# Patient Record
Sex: Female | Born: 1960
Health system: Southern US, Community
[De-identification: ages and names within clinical notes are randomized; demographics above are authoritative.]

## PROBLEM LIST (undated history)

## (undated) DIAGNOSIS — R7303 Prediabetes: Secondary | ICD-10-CM

## (undated) DIAGNOSIS — E785 Hyperlipidemia, unspecified: Secondary | ICD-10-CM

## (undated) DIAGNOSIS — N3281 Overactive bladder: Secondary | ICD-10-CM

## (undated) DIAGNOSIS — I639 Cerebral infarction, unspecified: Secondary | ICD-10-CM

## (undated) DIAGNOSIS — M1990 Unspecified osteoarthritis, unspecified site: Secondary | ICD-10-CM

## (undated) DIAGNOSIS — J449 Chronic obstructive pulmonary disease, unspecified: Secondary | ICD-10-CM

## (undated) DIAGNOSIS — T7840XA Allergy, unspecified, initial encounter: Secondary | ICD-10-CM

## (undated) DIAGNOSIS — K219 Gastro-esophageal reflux disease without esophagitis: Secondary | ICD-10-CM

## (undated) DIAGNOSIS — C4491 Basal cell carcinoma of skin, unspecified: Secondary | ICD-10-CM

## (undated) HISTORY — DX: Cerebral infarction, unspecified: I63.9

## (undated) HISTORY — DX: Gastro-esophageal reflux disease without esophagitis: K21.9

## (undated) HISTORY — PX: TUBAL LIGATION: SHX77

## (undated) HISTORY — DX: Unspecified osteoarthritis, unspecified site: M19.90

## (undated) HISTORY — PX: DILATION AND CURETTAGE OF UTERUS: SHX78

## (undated) HISTORY — DX: Chronic obstructive pulmonary disease, unspecified: J44.9

## (undated) HISTORY — DX: Allergy, unspecified, initial encounter: T78.40XA

## (undated) HISTORY — PX: COLONOSCOPY: SHX174

## (undated) HISTORY — PX: WISDOM TOOTH EXTRACTION: SHX21

---
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
## 2009-05-27 HISTORY — PX: SHOULDER ARTHROSCOPY WITH OPEN ROTATOR CUFF REPAIR: SHX6092

## 2011-08-28 DIAGNOSIS — K5790 Diverticulosis of intestine, part unspecified, without perforation or abscess without bleeding: Secondary | ICD-10-CM

## 2011-08-28 DIAGNOSIS — Z860101 Personal history of adenomatous and serrated colon polyps: Secondary | ICD-10-CM

## 2011-08-28 DIAGNOSIS — Z86010 Personal history of colonic polyps: Secondary | ICD-10-CM

## 2011-08-28 HISTORY — DX: Personal history of adenomatous and serrated colon polyps: Z86.0101

## 2011-08-28 HISTORY — DX: Diverticulosis of intestine, part unspecified, without perforation or abscess without bleeding: K57.90

## 2011-08-28 HISTORY — DX: Personal history of colonic polyps: Z86.010

## 2014-02-19 ENCOUNTER — Encounter: Payer: Self-pay | Admitting: Physical Medicine & Rehabilitation

## 2014-03-26 ENCOUNTER — Encounter: Payer: Medicare Other | Attending: Physical Medicine & Rehabilitation

## 2014-03-26 ENCOUNTER — Ambulatory Visit (HOSPITAL_BASED_OUTPATIENT_CLINIC_OR_DEPARTMENT_OTHER): Payer: Medicare Other | Admitting: Physical Medicine & Rehabilitation

## 2014-03-26 ENCOUNTER — Encounter: Payer: Self-pay | Admitting: Physical Medicine & Rehabilitation

## 2014-03-26 VITALS — BP 132/73 | HR 94 | Resp 16 | Ht 68.5 in | Wt 211.0 lb

## 2014-03-26 DIAGNOSIS — F17200 Nicotine dependence, unspecified, uncomplicated: Secondary | ICD-10-CM | POA: Diagnosis not present

## 2014-03-26 DIAGNOSIS — Z79899 Other long term (current) drug therapy: Secondary | ICD-10-CM

## 2014-03-26 DIAGNOSIS — IMO0002 Reserved for concepts with insufficient information to code with codable children: Secondary | ICD-10-CM

## 2014-03-26 DIAGNOSIS — M25519 Pain in unspecified shoulder: Secondary | ICD-10-CM | POA: Insufficient documentation

## 2014-03-26 DIAGNOSIS — J189 Pneumonia, unspecified organism: Secondary | ICD-10-CM | POA: Diagnosis not present

## 2014-03-26 DIAGNOSIS — M4802 Spinal stenosis, cervical region: Secondary | ICD-10-CM | POA: Diagnosis present

## 2014-03-26 DIAGNOSIS — Z5181 Encounter for therapeutic drug level monitoring: Secondary | ICD-10-CM

## 2014-03-26 DIAGNOSIS — G8929 Other chronic pain: Secondary | ICD-10-CM | POA: Insufficient documentation

## 2014-03-26 DIAGNOSIS — S43429A Sprain of unspecified rotator cuff capsule, initial encounter: Secondary | ICD-10-CM | POA: Insufficient documentation

## 2014-03-26 DIAGNOSIS — J90 Pleural effusion, not elsewhere classified: Secondary | ICD-10-CM | POA: Diagnosis not present

## 2014-03-26 DIAGNOSIS — S43421S Sprain of right rotator cuff capsule, sequela: Secondary | ICD-10-CM

## 2014-03-26 MED ORDER — LIDOCAINE 5 % EX PTCH: 1.0000 | MEDICATED_PATCH | CUTANEOUS | Status: DC

## 2014-03-26 MED ORDER — TRAZODONE HCL 50 MG PO TABS: 50.0000 mg | ORAL_TABLET | Freq: Every day | ORAL | Status: DC

## 2014-03-26 NOTE — Patient Instructions (Signed)
If urine drug screen looks okay which means no illicit drugs or other prescribed narcotic  Medications, or alcohol, then we can call in Tylenol #4, 1 tablet twice per day

## 2014-03-26 NOTE — Progress Notes (Signed)
Subjective:    Patient ID: Susan Cortez, female    DOB: 1961/06/25, 53 y.o.   MRN: [id number] CC:  Neck pain and Right shoulder pain WC DOI:  09/22/2007, Mechanism of injury Looking up at work on a repetitive basis, overhead reaching R arm repetitive HPI Initially was treated for neck pain, then was evaluated for Right shoulder pain.   Xray report 10/19/2008- C5-C6 and C6-C7 degenerative disc and spondylosis causing moderate to severe bilateral neural foraminal encroachment C6-C7 greater than C5-C6  Initially underwent physical therapy working both on shoulder and neck. This was not helpful, postop shoulder rehabilitation was helpful  Cervical MRI was performed. Orthopedic spine surgeon did not feel that surgery would be helpful. Cervical epidural injection was not helpful.  EMG bilateral upper limbs performed on 3/09-2008 was normal  Right shoulder MRI 03/09/2009 ordered by orthopedics shoulder surgeon. Full thickness tear supraspinatus and partial tear infraspinatus down sloping acromion.  Patient underwent open right shoulder decompression and rotator cuff repair  Pain medicine initial consultation 10/25/2009 recommendations for cervical epidural injections. Nor trip to lean was prescribed.  Last orthopedic visit for shoulder injury was 08/10/2010 20 pound lifting restriction, 15% permanent impairment right shoulder  Patient indicates that at settlement total disability percentage was 51%  Other treatments included lidocaine patch which was helpful. Past medications also included hydrocodone,, hydro-more phone, Diclonec, nor triptyline, lyrica, Oxycodone, Gabapentin Initially had some relief with Tylenol #3 became less effective with time. Patient complains of sleep disorder, pain related relieved with Andi and in the past. Reviewed pain medicine notes from [place]. Most recent note reviewed was 03/04/2013.  Pain Inventory Average Pain 5 Pain Right Now 7 My pain is constant,  burning, dull, stabbing, tingling and aching  In the last 24 hours, has pain interfered with the following? General activity 6 Relation with others 2 Enjoyment of life 2 What TIME of day is your pain at its worst? daytime Sleep (in general) Poor  Pain is worse with: walking, bending, standing and some activites Pain improves with: heat/ice and medication Relief from Meds: 5  Mobility walk without assistance how many minutes can you walk? 10-15 ability to climb steps?  yes do you drive?  yes transfers alone Do you have any goals in this area?  no  Function not employed: date last employed 11/2010 disabled: date disabled 10/21/2008 Do you have any goals in this area?  no  Neuro/Psych confusion  Prior Studies x-rays CT/MRI  Physicians involved in your care Dr Legrand Como, [facility]   History reviewed. No pertinent family history. History   Social History  . Marital Status: Divorced    Spouse Name: N/A    Number of Children: N/A  . Years of Education: N/A   Social History Main Topics  . Smoking status: Current Every Day Smoker  . Smokeless tobacco: None  . Alcohol Use: Yes  . Drug Use: None  . Sexual Activity: None   Other Topics Concern  . None   Social History Narrative  . None   Past Surgical History  Procedure Laterality Date  . Cesarean section    . Shoulder arthroscopy with open rotator cuff repair  05/2009  . Tubal ligation    . Tonsillectomy  1977   Past Medical History  Diagnosis Date  . Arthritis    BP 132/73  Pulse 94  Resp 16  Ht 5' 8.5" (1.74 m)  Wt 211 lb (95.709 kg)  BMI 31.61 kg/m2  SpO2 100%  Opioid Risk Score: 7 Fall Risk Score: Low Fall Risk (0-5 points) (patient educated handout given)   Review of Systems  Constitutional: Positive for fever, diaphoresis and appetite change.  Gastrointestinal: Positive for constipation.  Musculoskeletal: Positive for arthralgias, myalgias and neck pain.    Psychiatric/Behavioral: Positive for confusion.  All other systems reviewed and are negative.      Objective:   Physical Exam  Nursing note and vitals reviewed. Constitutional: She is oriented to person, place, and time. She appears well-developed and well-nourished.  40lb wt gain since injury  HENT:  Head: Normocephalic and atraumatic.  Musculoskeletal:       Right shoulder: She exhibits decreased range of motion.       Cervical back: She exhibits decreased range of motion and bony tenderness.  Decreased cervical flexion extension lateral rotation and lateral bending 25% of normal.  Motor strength is 5/5 in left deltoid, bicep, tricep, grip 4/5 right deltoid, bicep, tricep and grip  5/5 bilateral hip flexion knee extension ankle dorsiflexion plantar flexion  Positive Hawkins sign right shoulder  Decreased right shoulder internal and external rotation  Neurological: She is alert and oriented to person, place, and time. She has normal strength. She displays no atrophy and no tremor. No sensory deficit. Gait normal.  Reflex Scores:      Tricep reflexes are 2+ on the right side and 2+ on the left side.      Bicep reflexes are 2+ on the right side and 2+ on the left side.      Brachioradialis reflexes are 2+ on the right side and 2+ on the left side.      Patellar reflexes are 2+ on the right side and 2+ on the left side.      Achilles reflexes are 0 on the right side and 0 on the left side. Normal pinprick sensation bilateral C5 C6-C7 C8 L2-L3 L4-L5 distribution           Assessment & Plan:  1. Cervical spinal stenosis without evidence of radiculopathy. Chronic neck pain  #2. Right shoulder rotator cuff tear, status post repair with chronic pain and contracture 3. Insomnia related to chronic pain Recommendations Resume lidocaine patch on 12 off 12 Trazodone 50mg  qhs  Urine drug screen , if appropriate will start Tylenol #4 1 per os twice a day, will need to be seen in  clinic every 3 months if patient remains on Tylenol #4 on a chronic basis.  We also discussed that if she is flareups she'll need to be seen, may need either trigger point injection or joint injection.

## 2014-03-31 ENCOUNTER — Telehealth: Payer: Self-pay | Admitting: *Deleted

## 2014-03-31 NOTE — Telephone Encounter (Signed)
Prior auth initiated to [employer] via Cover My Meds for Lidocaine 5% patches.

## 2014-04-07 ENCOUNTER — Telehealth: Payer: Self-pay

## 2014-04-07 NOTE — Telephone Encounter (Signed)
Contacted patient regarding her PA appeal for Lidoderm patches. While on the phone patient inquired about her UDS. Patient's UDS was consistent. Are you going to begin prescribing for her?

## 2014-04-07 NOTE — Telephone Encounter (Signed)
Faxed pharmacy appeal to [facility] @ [phone]. Contacted Sabra @ [employer] to verify that she received the fax.

## 2014-04-07 NOTE — Telephone Encounter (Signed)
Tylenol #4 One po BID #60 2 RF

## 2014-04-07 NOTE — Telephone Encounter (Signed)
[employer] appeals dept is requesting a call back. They have questions regarding patient's Lidoderm patches. Contact # [phone] ext. [place]

## 2014-04-13 ENCOUNTER — Telehealth: Payer: Self-pay

## 2014-04-13 NOTE — Telephone Encounter (Signed)
Patient is requesting Tylenol #4 RX. UDS is consistent. Please advise.

## 2014-04-14 MED ORDER — ACETAMINOPHEN-CODEINE #4 300-60 MG PO TABS: 1.0000 | ORAL_TABLET | Freq: Two times a day (BID) | ORAL | Status: DC

## 2014-04-14 NOTE — Telephone Encounter (Signed)
Tylenol #4 RX called in at the pharmacy. Patient is aware.

## 2014-04-14 NOTE — Telephone Encounter (Signed)
Tylenol#4 1 po BID, #60 2 RF

## 2014-04-26 ENCOUNTER — Ambulatory Visit: Payer: Self-pay | Admitting: Physical Medicine & Rehabilitation

## 2014-06-16 ENCOUNTER — Other Ambulatory Visit: Payer: Self-pay | Admitting: Physical Medicine & Rehabilitation

## 2014-06-25 ENCOUNTER — Ambulatory Visit: Payer: Medicare Other | Admitting: Physical Medicine & Rehabilitation

## 2014-06-25 ENCOUNTER — Encounter: Payer: Medicare Other | Attending: Physical Medicine & Rehabilitation

## 2014-07-09 ENCOUNTER — Other Ambulatory Visit: Payer: Self-pay | Admitting: Physical Medicine & Rehabilitation

## 2014-07-19 ENCOUNTER — Ambulatory Visit: Payer: Medicare Other | Admitting: Physical Medicine & Rehabilitation

## 2014-08-02 ENCOUNTER — Other Ambulatory Visit: Payer: Self-pay | Admitting: Physical Medicine & Rehabilitation

## 2014-08-02 ENCOUNTER — Telehealth: Payer: Self-pay | Admitting: *Deleted

## 2014-08-02 NOTE — Telephone Encounter (Signed)
Patient called looking for a refill on Tylenol #3.  Last seen in office July 2015, and cancelled 2 other appts.  Has another appt on 08/13/14.

## 2014-08-03 NOTE — Telephone Encounter (Signed)
Called patient and left message - cannot move up appt.  08/13/14 and she cannot pick up RX early because she cancelled 2 prior appts.

## 2014-08-03 NOTE — Telephone Encounter (Signed)
Refilled tylenol #4 # 60 no refills x1 today per electronic request from pharmacy.  She has an appt 08/13/14 with Dr Letta Pate so no additional refills given.

## 2014-08-13 ENCOUNTER — Encounter: Payer: Medicare Other | Attending: Physical Medicine & Rehabilitation

## 2014-08-13 ENCOUNTER — Ambulatory Visit (HOSPITAL_BASED_OUTPATIENT_CLINIC_OR_DEPARTMENT_OTHER): Payer: Medicare Other | Admitting: Physical Medicine & Rehabilitation

## 2014-08-13 ENCOUNTER — Encounter: Payer: Self-pay | Admitting: Physical Medicine & Rehabilitation

## 2014-08-13 ENCOUNTER — Other Ambulatory Visit: Payer: Self-pay | Admitting: Physical Medicine & Rehabilitation

## 2014-08-13 VITALS — BP 129/80 | HR 67 | Resp 14

## 2014-08-13 DIAGNOSIS — M25511 Pain in right shoulder: Secondary | ICD-10-CM | POA: Diagnosis not present

## 2014-08-13 DIAGNOSIS — M4802 Spinal stenosis, cervical region: Secondary | ICD-10-CM | POA: Diagnosis not present

## 2014-08-13 DIAGNOSIS — Z79899 Other long term (current) drug therapy: Secondary | ICD-10-CM

## 2014-08-13 DIAGNOSIS — G8929 Other chronic pain: Secondary | ICD-10-CM | POA: Diagnosis present

## 2014-08-13 DIAGNOSIS — M542 Cervicalgia: Secondary | ICD-10-CM | POA: Diagnosis not present

## 2014-08-13 DIAGNOSIS — Z79891 Long term (current) use of opiate analgesic: Secondary | ICD-10-CM | POA: Diagnosis not present

## 2014-08-13 DIAGNOSIS — Z5181 Encounter for therapeutic drug level monitoring: Secondary | ICD-10-CM

## 2014-08-13 DIAGNOSIS — G894 Chronic pain syndrome: Secondary | ICD-10-CM

## 2014-08-13 DIAGNOSIS — G4700 Insomnia, unspecified: Secondary | ICD-10-CM | POA: Insufficient documentation

## 2014-08-13 MED ORDER — ACETAMINOPHEN-CODEINE #4 300-60 MG PO TABS: 1.0000 | ORAL_TABLET | Freq: Two times a day (BID) | ORAL | Status: DC

## 2014-08-13 NOTE — Patient Instructions (Signed)
Recommend walking 10 minutes 3 times per day

## 2014-08-13 NOTE — Progress Notes (Signed)
Subjective:    Patient ID: Susan Cortez, female    DOB: [birth year], 53 y.o.   MRN: [id number] Mechanism of injury Looking up at work on a repetitive basis, overhead reaching R arm repetitive  HPI  Initially was treated for neck pain, then was evaluated for Right shoulder pain.  Xray report 10/19/2008- C5-C6 and C6-C7 degenerative disc and spondylosis causing moderate to severe bilateral neural foraminal encroachment C6-C7 greater than C5-C6  Initially underwent physical therapy working both on shoulder and neck. This was not helpful, postop shoulder rehabilitation was helpful  Cervical MRI was performed. Orthopedic spine surgeon did not feel that surgery would be helpful.  Cervical epidural injection was not helpful.  EMG bilateral upper limbs performed on 3/09-2008 was normal  Right shoulder MRI 03/09/2009 ordered by orthopedics shoulder surgeon. Full thickness tear supraspinatus and partial tear infraspinatus down sloping acromion.  Patient underwent open right shoulder decompression and rotator cuff repair  Pain medicine initial consultation 10/25/2009 recommendations for cervical epidural injections. Nor trip to lean was prescribed.  Last orthopedic visit for shoulder injury was 08/10/2010 20 pound lifting restriction, 15% permanent impairment right shoulder  Patient indicates that at settlement total disability percentage was 51%  Other treatments included lidocaine patch which was helpful.  Past medications also included hydrocodone,, hydro-more phone, Diclonec, nor triptyline, lyrica, Oxycodone, Gabapentin  Initially had some relief with Tylenol #3 became less effective with time.  Patient complains of sleep disorder, pain related relieved with Andi and in the past.  Reviewed pain medicine notes from [place]. Most recent note reviewed was 03/04/2013.     HPI Patient no longer taking trazodone, Tylenol No. 4 seems to be helping with the sleep Interval medical history had episode of  tinnitus No other medical issues.,  tinnitus resolved    Pain Inventory Average Pain 6 Pain Right Now 5 My pain is burning, dull, stabbing and aching  In the last 24 hours, has pain interfered with the following? General activity 7 Relation with others 4 Enjoyment of life 5 What TIME of day is your pain at its worst? daytime Sleep (in general) Poor  Pain is worse with: walking and some activites Pain improves with: rest and medication Relief from Meds: 7  Mobility walk without assistance ability to climb steps?  yes do you drive?  yes Do you have any goals in this area?  no  Function disabled: date disabled . Do you have any goals in this area?  no  Neuro/Psych weakness  Prior Studies Any changes since last visit?  no  Physicians involved in your care Any changes since last visit?  no   History reviewed. No pertinent family history. History   Social History  . Marital Status: Divorced    Spouse Name: N/A    Number of Children: N/A  . Years of Education: N/A   Social History Main Topics  . Smoking status: Current Every Day Smoker  . Smokeless tobacco: None  . Alcohol Use: Yes  . Drug Use: None  . Sexual Activity: None   Other Topics Concern  . None   Social History Narrative   Past Surgical History  Procedure Laterality Date  . Cesarean section    . Shoulder arthroscopy with open rotator cuff repair  05/2009  . Tubal ligation    . Tonsillectomy  1977   Past Medical History  Diagnosis Date  . Arthritis    BP 129/80 mmHg  Pulse 67  Resp 14  SpO2 99%  Opioid Risk Score:   Fall Risk Score:   Review of Systems  Gastrointestinal: Positive for constipation.  Neurological: Positive for weakness.  All other systems reviewed and are negative.      Objective:   Physical Exam  HENT:  Head: Normocephalic and atraumatic.  Musculoskeletal:  Right shoulder: She exhibits decreased range of motion.  Cervical back: She exhibits decreased range  of motion and bony tenderness.  Decreased cervical flexion extension lateral rotation and lateral bending 25% of normal.  Motor strength is 5/5 in left deltoid, bicep, tricep, grip 4/5 right deltoid, bicep, tricep and grip  5/5 bilateral hip flexion knee extension ankle dorsiflexion plantar flexion  Positive Hawkins sign right shoulder  Decreased right shoulder internal and external rotation         Assessment & Plan:  . Cervical spinal stenosis without evidence of radiculopathy. Chronic neck pain  #2. Right shoulder rotator cuff tear, status post repair with chronic pain and contracture  3. Insomnia related to chronic pain  Recommendations  Resume lidocaine patch on 12 off 12 - not approved by insurance  Urine drug screen ,appropriate will need to be seen in clinic every 3 months if patient remains on Tylenol #4 on a chronic basis.  We also discussed that if she is flareups she'll need to be seen, may need either trigger point injection or joint injection.  Pt declines

## 2014-08-17 LAB — PMP ALCOHOL METABOLITE (ETG): ETGU: NEGATIVE ng/mL

## 2014-08-19 LAB — OPIATES/OPIOIDS (LC/MS-MS)
CODEINE URINE: 4890 ng/mL (ref <50)
HYDROMORPHONE: NEGATIVE ng/mL (ref <50)
Hydrocodone: NEGATIVE ng/mL (ref <50)
MORPHINE: 2266 ng/mL (ref <50)
Norhydrocodone, Ur: NEGATIVE ng/mL (ref <50)
Noroxycodone, Ur: NEGATIVE ng/mL (ref <50)
OXYCODONE, UR: NEGATIVE ng/mL (ref <50)
OXYMORPHONE, URINE: NEGATIVE ng/mL (ref <50)

## 2014-09-09 LAB — PRESCRIPTION MONITORING PROFILE (SOLSTAS)
Amphetamine/Meth: NEGATIVE ng/mL
Barbiturate Screen, Urine: NEGATIVE ng/mL
Benzodiazepine Screen, Urine: NEGATIVE ng/mL
Buprenorphine, Urine: NEGATIVE ng/mL
CANNABINOID SCRN UR: NEGATIVE ng/mL
COCAINE METABOLITES: NEGATIVE ng/mL
CREATININE, URINE: 31.55 mg/dL (ref >20.0)
Carisoprodol, Urine: NEGATIVE ng/mL
ECSTASY: NEGATIVE ng/mL
Fentanyl, Ur: NEGATIVE ng/mL
MEPERIDINE UR: NEGATIVE ng/mL
Methadone Screen, Urine: NEGATIVE ng/mL
Nitrites, Initial: NEGATIVE ug/mL
OXYCODONE SCRN UR: NEGATIVE ng/mL
PROPOXYPHENE: NEGATIVE ng/mL
Tapentadol, urine: NEGATIVE ng/mL
Tramadol Scrn, Ur: NEGATIVE ng/mL
Zolpidem, Urine: NEGATIVE ng/mL
pH, Initial: 5.5 pH (ref 4.5–8.9)

## 2014-09-13 NOTE — Progress Notes (Signed)
Urine drug screen for this encounter is consistent for prescribed medication 

## 2014-11-16 ENCOUNTER — Encounter: Payer: Self-pay | Admitting: Registered Nurse

## 2014-11-16 ENCOUNTER — Encounter: Payer: Medicare Other | Attending: Registered Nurse | Admitting: Registered Nurse

## 2014-11-16 VITALS — BP 136/78 | HR 92 | Resp 14

## 2014-11-16 DIAGNOSIS — M4802 Spinal stenosis, cervical region: Secondary | ICD-10-CM | POA: Insufficient documentation

## 2014-11-16 DIAGNOSIS — Z5181 Encounter for therapeutic drug level monitoring: Secondary | ICD-10-CM

## 2014-11-16 DIAGNOSIS — G894 Chronic pain syndrome: Secondary | ICD-10-CM | POA: Insufficient documentation

## 2014-11-16 DIAGNOSIS — Z79899 Other long term (current) drug therapy: Secondary | ICD-10-CM | POA: Diagnosis not present

## 2014-11-16 DIAGNOSIS — S43421S Sprain of right rotator cuff capsule, sequela: Secondary | ICD-10-CM

## 2014-11-16 MED ORDER — ACETAMINOPHEN-CODEINE #4 300-60 MG PO TABS: 1.0000 | ORAL_TABLET | Freq: Two times a day (BID) | ORAL | Status: DC

## 2014-11-16 MED ORDER — ACETAMINOPHEN-CODEINE #4 300-60 MG PO TABS: 1.0000 | ORAL_TABLET | Freq: Every day | ORAL | Status: DC | PRN

## 2014-11-16 NOTE — Progress Notes (Addendum)
Subjective:    Patient ID: Susan Cortez, female    DOB: 1961/03/22, 54 y.o.   MRN: [id number]  HPI: Ms. Keita Demarco is a 54 year old female who returns for follow up appointment for chronic pain and medication refill. She says her pain is located in her neck and right shoulder. She states the Tylenol #87made her foggy and groggy, she has been using one tablet daily at hs. Tablets decreased to 30. Instructed to call office if she experience an increased in pain she verbalizes understanding.  Pain Inventory Average Pain 5 Pain Right Now 6 My pain is constant, sharp, tingling and aching  In the last 24 hours, has pain interfered with the following? General activity 4 Relation with others 5 Enjoyment of life 5 What TIME of day is your pain at its worst? VARIES Sleep (in general) Fair  Pain is worse with: walking, bending, standing and some activites Pain improves with: rest, heat/ice and medication Relief from Meds: 7  Mobility walk without assistance how many minutes can you walk? 30 ability to climb steps?  yes do you drive?  yes  Function disabled: date disabled .  Neuro/Psych weakness numbness tingling  Prior Studies Any changes since last visit?  no  Physicians involved in your care Any changes since last visit?  no   History reviewed. No pertinent family history. History   Social History  . Marital Status: Divorced    Spouse Name: N/A  . Number of Children: N/A  . Years of Education: N/A   Social History Main Topics  . Smoking status: Current Every Day Smoker  . Smokeless tobacco: Not on file  . Alcohol Use: Yes  . Drug Use: Not on file  . Sexual Activity: Not on file   Other Topics Concern  . None   Social History Narrative   Past Surgical History  Procedure Laterality Date  . Cesarean section    . Shoulder arthroscopy with open rotator cuff repair  05/2009  . Tubal ligation    . Tonsillectomy  1977   Past Medical History  Diagnosis Date   . Arthritis    BP 136/78 mmHg  Pulse 92  Resp 14  SpO2 99%  Opioid Risk Score:   Fall Risk Score: Low Fall Risk (0-5 points)`1  Depression screen PHQ 2/9 = 9 score  No flowsheet data found.   Review of Systems  HENT: Negative.   Eyes: Negative.   Respiratory: Negative.   Cardiovascular: Negative.   Gastrointestinal: Negative.   Endocrine: Negative.   Genitourinary: Negative.   Musculoskeletal: Positive for back pain.  Skin: Negative.   Allergic/Immunologic: Negative.   Neurological: Positive for weakness and numbness.  Hematological: Negative.   Psychiatric/Behavioral: Negative.        Objective:   Physical Exam  Constitutional: She is oriented to person, place, and time. She appears well-developed and well-nourished.  HENT:  Head: Normocephalic and atraumatic.  Neck: Normal range of motion. Neck supple.  Cardiovascular: Normal rate, regular rhythm and normal heart sounds.   Pulmonary/Chest: Effort normal and breath sounds normal.  Musculoskeletal:  Normal Muscle Bulk and Muscle Testing Reveals: Upper Extremities: Right Decreased ROM 45 Degrees and Muscle Strength 4/5 Left: Full ROM and Muscle Strength 5/5 Thoracic Paraspinal Tenderness: T-1- T-3 Lower Extremities: Full ROM and Muscle Strength 5/5 Arises from chair with ease Narrow based Gait   Neurological: She is alert and oriented to person, place, and time.  Skin: Skin is warm and dry.  Psychiatric:  She has a normal mood and affect.  Nursing note and vitals reviewed.         Assessment & Plan:  1.Cervical spinal stenosis without evidence of radiculopathy. Chronic neck pain: Refilled: Decreased to  Tylenol #4 300/60mg  daily HS.#30. 2. Right shoulder rotator cuff tear, status post repair with chronic pain and contracture : Continue with exercise regime 3. Insomnia: Improving  20 minutes of face to face patient care time was spent during this visit. All questions was encouraged and answered.  F/U  3 months

## 2015-01-28 ENCOUNTER — Ambulatory Visit (HOSPITAL_BASED_OUTPATIENT_CLINIC_OR_DEPARTMENT_OTHER): Payer: Medicare Other | Admitting: Physical Medicine & Rehabilitation

## 2015-01-28 ENCOUNTER — Encounter: Payer: Self-pay | Admitting: Physical Medicine & Rehabilitation

## 2015-01-28 ENCOUNTER — Other Ambulatory Visit: Payer: Self-pay | Admitting: Physical Medicine & Rehabilitation

## 2015-01-28 ENCOUNTER — Encounter: Payer: Medicare Other | Attending: Registered Nurse

## 2015-01-28 VITALS — BP 106/68 | HR 68 | Resp 14

## 2015-01-28 DIAGNOSIS — S43421S Sprain of right rotator cuff capsule, sequela: Secondary | ICD-10-CM

## 2015-01-28 DIAGNOSIS — M4802 Spinal stenosis, cervical region: Secondary | ICD-10-CM

## 2015-01-28 DIAGNOSIS — G8929 Other chronic pain: Secondary | ICD-10-CM

## 2015-01-28 DIAGNOSIS — G894 Chronic pain syndrome: Secondary | ICD-10-CM

## 2015-01-28 DIAGNOSIS — Z79899 Other long term (current) drug therapy: Secondary | ICD-10-CM

## 2015-01-28 DIAGNOSIS — Z5181 Encounter for therapeutic drug level monitoring: Secondary | ICD-10-CM | POA: Diagnosis not present

## 2015-01-28 MED ORDER — ACETAMINOPHEN-CODEINE #4 300-60 MG PO TABS: 1.0000 | ORAL_TABLET | Freq: Every day | ORAL | Status: DC | PRN

## 2015-01-28 NOTE — Patient Instructions (Signed)
Recommend walking 10 minutes 3 times per day 

## 2015-01-28 NOTE — Progress Notes (Signed)
Subjective:    Patient ID: Susan Cortez, female    DOB: March 20, 1961, 54 y.o.   MRN: [id number] HPI   Initially was treated for neck pain, then was evaluated for Right shoulder pain.   Xray report 10/19/2008- C5-C6 and C6-C7 degenerative disc and spondylosis causing moderate to severe bilateral neural foraminal encroachment C6-C7 greater than C5-C6   Initially underwent physical therapy working both on shoulder and neck. This was not helpful, postop shoulder rehabilitation was helpful   Cervical MRI was performed. Orthopedic spine surgeon did not feel that surgery would be helpful.   Cervical epidural injection was not helpful.   EMG bilateral upper limbs performed on 3/09-2008 was normal   Right shoulder MRI 03/09/2009 ordered by orthopedics shoulder surgeon. Full thickness tear supraspinatus and partial tear infraspinatus down sloping acromion.     HPI Right sided shoulder and neck pain. No big changes in overall pain pattern or severity. Recently traveled to [place]  Currently taking Tylenol No. 4 approximate one tablet per day No over-the-counter medication usage Uses heating pad as needed Also gets massages approximately every 3 months.  Patient also does walking for exercise, 10 minutes couple times per week  Pain Inventory Average Pain 6 Pain Right Now 6 My pain is constant, burning, dull and stabbing  In the last 24 hours, has pain interfered with the following? General activity 4 Relation with others 4 Enjoyment of life 4 What TIME of day is your pain at its worst? daytime Sleep (in general) Poor  Pain is worse with: walking, bending and some activites Pain improves with: rest, heat/ice, therapy/exercise and medication Relief from Meds: 7  Mobility walk without assistance how many minutes can you walk? 15 ability to climb steps?  yes do you drive?  yes Do you have any goals in this area?  no  Function disabled: date disabled 10/21/2014 Do you have any goals in  this area?  no  Neuro/Psych weakness  Prior Studies Any changes since last visit?  no  Physicians involved in your care Any changes since last visit?  no   History reviewed. No pertinent family history. History   Social History  . Marital Status: Divorced    Spouse Name: N/A  . Number of Children: N/A  . Years of Education: N/A   Social History Main Topics  . Smoking status: Current Every Day Smoker  . Smokeless tobacco: Not on file  . Alcohol Use: Yes  . Drug Use: Not on file  . Sexual Activity: Not on file   Other Topics Concern  . None   Social History Narrative   Past Surgical History  Procedure Laterality Date  . Cesarean section    . Shoulder arthroscopy with open rotator cuff repair  05/2009  . Tubal ligation    . Tonsillectomy  1977   Past Medical History  Diagnosis Date  . Arthritis    BP 106/68 mmHg  Pulse 68  Resp 14  SpO2 99%  Opioid Risk Score:   Fall Risk Score: Moderate Fall Risk (6-13 points) (patient declined educational material)`1  Depression screen PHQ 2/9  Depression screen PHQ 2/9 11/22/2014  Decreased Interest 3  Down, Depressed, Hopeless 0  PHQ - 2 Score 3  Altered sleeping 1  Tired, decreased energy 3  Change in appetite 0  Feeling bad or failure about yourself  0  Trouble concentrating 2  Moving slowly or fidgety/restless 0  Suicidal thoughts 0  PHQ-9 Score 9     Review  of Systems  HENT: Negative.   Eyes: Negative.   Respiratory: Negative.   Cardiovascular: Positive for leg swelling.  Gastrointestinal: Positive for constipation.  Endocrine: Negative.   Genitourinary: Negative.   Musculoskeletal: Negative.   Skin: Negative.   Allergic/Immunologic: Negative.   Neurological: Positive for weakness.  Hematological: Negative.   Psychiatric/Behavioral: Negative.   All other systems reviewed and are negative.      Objective:   Physical Exam  Constitutional: She is oriented to person, place, and time. She appears  well-developed and well-nourished.  HENT:  Head: Normocephalic and atraumatic.  Eyes: Conjunctivae and EOM are normal. Pupils are equal, round, and reactive to light.  Musculoskeletal:       Cervical back: She exhibits decreased range of motion. She exhibits no tenderness, no deformity and no spasm.       Thoracic back: She exhibits no tenderness and no deformity.     Neurological: She is alert and oriented to person, place, and time. No sensory deficit.  Motor strength is 5/5 Left deltoid, biceps, triceps, grip, hip flexor, knee extensor, ankle dorsiflexor and plantar flexor 3 minus right deltoid, 4 at the biceps triceps on the right as well as grip. Pain inhibition from right shoulder Sensation is normal in bilateral upper extremities     Psychiatric: She has a normal mood and affect.  Nursing note and vitals reviewed.         Assessment & Plan:  1. Chronic neck pain with history of cervical stenosis no radicular component at the current time. The weakness in the right upper extremities appears to be pain inhibition from right shoulder pain.  2. Chronic right shoulder pain rotator cuff tears, limitations in range of motion also affecting strength of the right upper extremity  Patient has remained functional and able to perform ADLs, is able to travel and do some housework. No adverse side effects from her narcotic analgesic medication other than constipation which is managed with GlycoLax  Continue Tylenol No. 4 one tablet per day Encourage patient to take a more active role in fitness, her walking tolerance is about 10 minutes but she can increase this to 3 times per day and this has been advised.  Return to clinic in 3 months with nurse practitioner M.D. Visit in one year or as needed

## 2015-01-29 LAB — PMP ALCOHOL METABOLITE (ETG): Ethyl Glucuronide (EtG): NEGATIVE ng/mL

## 2015-02-01 LAB — PRESCRIPTION MONITORING PROFILE (SOLSTAS)
Amphetamine/Meth: NEGATIVE ng/mL
BUPRENORPHINE, URINE: NEGATIVE ng/mL
Barbiturate Screen, Urine: NEGATIVE ng/mL
Benzodiazepine Screen, Urine: NEGATIVE ng/mL
CREATININE, URINE: 58.97 mg/dL (ref >20.0)
Cannabinoid Scrn, Ur: NEGATIVE ng/mL
Carisoprodol, Urine: NEGATIVE ng/mL
Cocaine Metabolites: NEGATIVE ng/mL
ECSTASY: NEGATIVE ng/mL
Fentanyl, Ur: NEGATIVE ng/mL
MEPERIDINE UR: NEGATIVE ng/mL
METHADONE SCREEN, URINE: NEGATIVE ng/mL
Nitrites, Initial: NEGATIVE ug/mL
OXYCODONE SCRN UR: NEGATIVE ng/mL
Propoxyphene: NEGATIVE ng/mL
Tapentadol, urine: NEGATIVE ng/mL
Tramadol Scrn, Ur: NEGATIVE ng/mL
Zolpidem, Urine: NEGATIVE ng/mL
pH, Initial: 7.7 pH (ref 4.5–8.9)

## 2015-02-01 LAB — OPIATES/OPIOIDS (LC/MS-MS)
Codeine Urine: 324 ng/mL — AB (ref <50)
Hydrocodone: NEGATIVE ng/mL (ref <50)
Hydromorphone: NEGATIVE ng/mL (ref <50)
MORPHINE: 569 ng/mL — AB (ref <50)
NOROXYCODONE, UR: NEGATIVE ng/mL (ref <50)
Norhydrocodone, Ur: NEGATIVE ng/mL (ref <50)
OXYCODONE, UR: NEGATIVE ng/mL (ref <50)
Oxymorphone: NEGATIVE ng/mL (ref <50)

## 2015-02-09 NOTE — Progress Notes (Signed)
Urine drug screen for this encounter is consistent for prescribed medication 

## 2015-04-29 ENCOUNTER — Ambulatory Visit: Payer: Medicare Other | Admitting: Registered Nurse

## 2015-05-05 ENCOUNTER — Encounter: Payer: Self-pay | Admitting: Registered Nurse

## 2015-05-05 ENCOUNTER — Encounter: Payer: Medicare Other | Attending: Registered Nurse | Admitting: Registered Nurse

## 2015-05-05 VITALS — BP 124/86 | HR 93

## 2015-05-05 DIAGNOSIS — S43421S Sprain of right rotator cuff capsule, sequela: Secondary | ICD-10-CM | POA: Insufficient documentation

## 2015-05-05 DIAGNOSIS — M4802 Spinal stenosis, cervical region: Secondary | ICD-10-CM

## 2015-05-05 DIAGNOSIS — Z5181 Encounter for therapeutic drug level monitoring: Secondary | ICD-10-CM | POA: Diagnosis not present

## 2015-05-05 DIAGNOSIS — G894 Chronic pain syndrome: Secondary | ICD-10-CM | POA: Insufficient documentation

## 2015-05-05 DIAGNOSIS — Z79899 Other long term (current) drug therapy: Secondary | ICD-10-CM | POA: Diagnosis not present

## 2015-05-05 MED ORDER — ACETAMINOPHEN-CODEINE #4 300-60 MG PO TABS: 1.0000 | ORAL_TABLET | Freq: Every day | ORAL | Status: DC | PRN

## 2015-05-05 NOTE — Progress Notes (Signed)
Subjective:    Patient ID: Susan Cortez, female    DOB: 1960/12/13, 54 y.o.   MRN: [id number]  HPI: [name] is a 54 year old female who returns for follow up appointment for chronic pain and medication refill. She says her pain is located in her neck and right shoulder. She rates her pain 6. Her current exercise regime is walking.  Pain Inventory Average Pain 7 Pain Right Now 6 My pain is sharp, burning, stabbing and aching  In the last 24 hours, has pain interfered with the following? General activity 5 Relation with others 5 Enjoyment of life 5 What TIME of day is your pain at its worst? daytime Sleep (in general) Poor  Pain is worse with: walking, bending, sitting, standing and some activites Pain improves with: rest, pacing activities and medication Relief from Meds: 6  Mobility walk without assistance how many minutes can you walk? 10 ability to climb steps?  yes do you drive?  yes  Function disabled: date disabled .  Neuro/Psych weakness  Prior Studies Any changes since last visit?  no  Physicians involved in your care Any changes since last visit?  no   History reviewed. No pertinent family history. Social History   Social History  . Marital Status: Divorced    Spouse Name: N/A  . Number of Children: N/A  . Years of Education: N/A   Social History Main Topics  . Smoking status: Current Every Day Smoker  . Smokeless tobacco: None  . Alcohol Use: Yes  . Drug Use: None  . Sexual Activity: Not Asked   Other Topics Concern  . None   Social History Narrative   Past Surgical History  Procedure Laterality Date  . Cesarean section    . Shoulder arthroscopy with open rotator cuff repair  05/2009  . Tubal ligation    . Tonsillectomy  1977   Past Medical History  Diagnosis Date  . Arthritis    BP 124/86 mmHg  Pulse 93  SpO2 97%  Opioid Risk Score:   Fall Risk Score:  `1  Depression screen PHQ 2/9  Depression screen [facility] 2/9  05/05/2015 11/22/2014  Decreased Interest 3 3  Down, Depressed, Hopeless 0 0  PHQ - 2 Score 3 3  Altered sleeping - 1  Tired, decreased energy - 3  Change in appetite - 0  Feeling bad or failure about yourself  - 0  Trouble concentrating - 2  Moving slowly or fidgety/restless - 0  Suicidal thoughts - 0  PHQ-9 Score - 9     Review of Systems  Neurological: Positive for weakness.  All other systems reviewed and are negative.      Objective:   Physical Exam  Constitutional: She is oriented to person, place, and time. She appears well-developed and well-nourished.  HENT:  Head: Normocephalic and atraumatic.  Neck: Normal range of motion. Neck supple.  Cervical Paraspinal Tenderness: C-3- C-5  Cardiovascular: Normal rate and regular rhythm.   Pulmonary/Chest: Effort normal and breath sounds normal.  Musculoskeletal:  Normal Muscle Bulk and Muscle Testing Reveals: Upper Extremities: Right: Decreased ROM 90 Degrees and Muscle Strength 5/5 Left: Full ROM and Muscle Strength 5/5 Right AC Joint Tenderness  Thoracic Paraspinal Tenderness T-1- T-3 Mainly Right Side Lumbar Paraspinal Tenderness: L-3- L-4  Lower Extremities: Full ROM and Muscle Strength 5/5 Arises From Chair with Ease Narrow Based gait  Neurological: She is alert and oriented to person, place, and time.  Skin: Skin is warm  and dry.  Psychiatric: She has a normal mood and affect.  Nursing note and vitals reviewed.         Assessment & Plan:  1.Cervical spinal stenosis without evidence of radiculopathy. Chronic neck pain: Refilled Tylenol #4 300/'60mg'$  daily HS.#30. 2. Right shoulder rotator cuff tear, status post repair with chronic pain and contracture : Continue with exercise regime 3. Insomnia: No complaints today.  20 minutes of face to face patient care time was spent during this visit. All questions was encouraged and answered.  F/U 3 months

## 2015-05-31 ENCOUNTER — Other Ambulatory Visit: Payer: Self-pay | Admitting: Physical Medicine & Rehabilitation

## 2015-07-26 ENCOUNTER — Ambulatory Visit: Payer: Medicare Other | Admitting: Adult Health

## 2015-08-04 ENCOUNTER — Ambulatory Visit: Payer: Medicare Other | Admitting: Physical Medicine & Rehabilitation

## 2015-08-30 ENCOUNTER — Ambulatory Visit: Payer: Medicare Other | Admitting: Physical Medicine & Rehabilitation

## 2015-09-05 ENCOUNTER — Other Ambulatory Visit: Payer: Self-pay | Admitting: Registered Nurse

## 2015-10-06 ENCOUNTER — Ambulatory Visit (HOSPITAL_BASED_OUTPATIENT_CLINIC_OR_DEPARTMENT_OTHER): Payer: Medicare Other | Admitting: Physical Medicine & Rehabilitation

## 2015-10-06 ENCOUNTER — Encounter: Payer: Medicare Other | Attending: Physical Medicine & Rehabilitation

## 2015-10-06 ENCOUNTER — Encounter: Payer: Self-pay | Admitting: Physical Medicine & Rehabilitation

## 2015-10-06 VITALS — BP 142/63 | HR 75 | Resp 14

## 2015-10-06 DIAGNOSIS — M545 Low back pain, unspecified: Secondary | ICD-10-CM

## 2015-10-06 DIAGNOSIS — M4802 Spinal stenosis, cervical region: Secondary | ICD-10-CM

## 2015-10-06 DIAGNOSIS — G8929 Other chronic pain: Secondary | ICD-10-CM

## 2015-10-06 DIAGNOSIS — Z5181 Encounter for therapeutic drug level monitoring: Secondary | ICD-10-CM

## 2015-10-06 DIAGNOSIS — G894 Chronic pain syndrome: Secondary | ICD-10-CM

## 2015-10-06 DIAGNOSIS — Z79899 Other long term (current) drug therapy: Secondary | ICD-10-CM | POA: Diagnosis not present

## 2015-10-06 MED ORDER — ACETAMINOPHEN-CODEINE #4 300-60 MG PO TABS: ORAL_TABLET | ORAL | Status: DC

## 2015-10-06 NOTE — Patient Instructions (Signed)
Please call us if you have increased hip pain or back pain or start getting some sciatic symptoms.   The [facility] office has a primary care clinic.  Dr. Larose Kells or Birdie Riddle are recommended as is nurse practitioner Debbrah Alar

## 2015-10-06 NOTE — Progress Notes (Signed)
Subjective:    Patient ID: Susan Cortez, female    DOB: 1961/04/09, 55 y.o.   MRN: [id number] Initially was treated for neck pain, then was evaluated for Right shoulder pain.   Xray report 10/19/2008- C5-C6 and C6-C7 degenerative disc and spondylosis causing moderate to severe bilateral neural foraminal encroachment C6-C7 greater than C5-C6   Initially underwent physical therapy working both on shoulder and neck. This was not helpful, postop shoulder rehabilitation was helpful   Cervical MRI was performed. Orthopedic spine surgeon did not feel that surgery would be helpful.   Cervical epidural injection was not helpful.   EMG bilateral upper limbs performed on 3/09-2008 was normal   Right shoulder MRI 03/09/2009 ordered by orthopedics shoulder surgeon. Full thickness tear supraspinatus and partial tear infraspinatus down sloping acromion.   HPI Interval history right hip pain. Right hip x-ray report reviewed, no osteoarthritis. No history of fall or trauma. He was going on for about 9 months. Saw orthopedic surgeon. Had a right hip trochanteric bursa injection. Very good relief with this.  Patient has had history of pain shooting down the right leg. This started after a long car ride about a year ago she has had MRI but she does not have the results. She has not had any other treatments such as physical therapy or injections.      Pain Inventory Average Pain 6 Pain Right Now 7 My pain is burning, stabbing and aching  In the last 24 hours, has pain interfered with the following? General activity 2 Relation with others 2 Enjoyment of life 3 What TIME of day is your pain at its worst? daytime Sleep (in general) Fair  Pain is worse with: walking, bending and some activites Pain improves with: rest, heat/ice and medication Relief from Meds: 6  Mobility walk without assistance how many minutes can you walk? 10 ability to climb steps?  yes do you drive?  yes Do you have any goals  in this area?  no  Function disabled: date disabled . Do you have any goals in this area?  no  Neuro/Psych No problems in this area  Prior Studies Any changes since last visit?  no  Physicians involved in your care Any changes since last visit?  no   History reviewed. No pertinent family history. Social History   Social History  . Marital Status: Divorced    Spouse Name: N/A  . Number of Children: N/A  . Years of Education: N/A   Social History Main Topics  . Smoking status: Current Every Day Smoker  . Smokeless tobacco: None  . Alcohol Use: Yes  . Drug Use: None  . Sexual Activity: Not Asked   Other Topics Concern  . None   Social History Narrative   Past Surgical History  Procedure Laterality Date  . Cesarean section    . Shoulder arthroscopy with open rotator cuff repair  05/2009  . Tubal ligation    . Tonsillectomy  1977   Past Medical History  Diagnosis Date  . Arthritis    BP 142/63 mmHg  Pulse 75  Resp 14  SpO2 97%  Opioid Risk Score:   Fall Risk Score:  `1  Depression screen PHQ 2/9  Depression screen [facility] 2/9 05/05/2015 11/22/2014  Decreased Interest 3 3  Down, Depressed, Hopeless 0 0  PHQ - 2 Score 3 3  Altered sleeping - 1  Tired, decreased energy - 3  Change in appetite - 0  Feeling bad or failure about  yourself  - 0  Trouble concentrating - 2  Moving slowly or fidgety/restless - 0  Suicidal thoughts - 0  PHQ-9 Score - 9     Review of Systems  All other systems reviewed and are negative.      Objective:   Physical Exam  Constitutional: She is oriented to person, place, and time. She appears well-developed and well-nourished.  HENT:  Head: Normocephalic and atraumatic.  Eyes: Conjunctivae are normal. Pupils are equal, round, and reactive to light.  Musculoskeletal:  Cervical spine with 50% flexion and extension lateral bending and rotation.  Lumbar spine has full flexion, 50% extension, 100% lateral bending.  Mild  reduction in internal/external rotation right hip but no pain, left hip has full range of motion without pain.  Neurological: She is alert and oriented to person, place, and time. She has normal strength. A sensory deficit is present.  Reflex Scores:      Patellar reflexes are 2+ on the right side and 2+ on the left side.      Achilles reflexes are 1+ on the right side and 1+ on the left side. Motor strength is 5/5 bilateral deltoid 4 at the right bicep tricep and grip 5 at the left deltoid muscle triceps grip 5/5 bilateral hip flexor and knee extensor  Negative straight leg raising  Psychiatric: She has a normal mood and affect.  Nursing note and vitals reviewed.         Assessment & Plan:  1. Chronic neck pain with history of cervical stenosis no radicular component at the current time. The weakness in the right upper is chronic and multifactorial  2. Chronic right shoulder pain rotator cuff tears, limitations in range of motion also affecting strength of the right upper extremity  Patient has remained functional and able to perform ADLs, is able to travel and do some housework. No adverse side effects from her narcotic analgesic medication other than constipation which is managed with GlycoLax  Continue Tylenol No. 4 one tablet per day Encourage patient to take a more active role in fitness, she has not done this thus far  #3 hip pain, right, related to trochanteric bursitis. Good results with one injection. Discussed that we can do injections here if she needs a recurrent reinjection  4. Chronic low back pain. She has been told she has degenerative disc, I do not have MRI results. If she develops radicular pain she may benefit from epidural. I did indicate that we do those here. We would need a copy of her MRI however  Return to clinic in 3 months with nurse practitioner M.D. Visit in one year or as needed

## 2015-10-11 LAB — 6-ACETYLMORPHINE,TOXASSURE ADD
6-ACETYLMORPHINE: NEGATIVE
6-acetylmorphine: NOT DETECTED ng/mg creat

## 2015-10-11 LAB — TOXASSURE SELECT,+ANTIDEPR,UR: PDF: 0

## 2015-10-12 NOTE — Progress Notes (Signed)
Urine drug screen for this encounter is consistent for prescribed medication 

## 2015-11-23 DIAGNOSIS — F17200 Nicotine dependence, unspecified, uncomplicated: Secondary | ICD-10-CM | POA: Diagnosis not present

## 2015-11-23 DIAGNOSIS — Z0000 Encounter for general adult medical examination without abnormal findings: Secondary | ICD-10-CM | POA: Diagnosis not present

## 2015-11-23 DIAGNOSIS — R7309 Other abnormal glucose: Secondary | ICD-10-CM | POA: Diagnosis not present

## 2015-11-23 DIAGNOSIS — E7800 Pure hypercholesterolemia, unspecified: Secondary | ICD-10-CM | POA: Diagnosis not present

## 2015-11-30 ENCOUNTER — Telehealth: Payer: Self-pay | Admitting: *Deleted

## 2015-11-30 MED ORDER — BUPRENORPHINE 5 MCG/HR TD PTWK: 5.0000 ug | MEDICATED_PATCH | TRANSDERMAL | Status: DC

## 2015-11-30 NOTE — Telephone Encounter (Signed)
I spoke with Susan Cortez and let her know she needs to have a copy of the scan report sent to Dr Letta Pate.  I also informed her of the patch being called to the pharmacy.  It may require Prior auth from her insurance. [employer]. I informed the pharmacy it can be dispensed as brand or generic per insurance preference.

## 2015-11-30 NOTE — Telephone Encounter (Signed)
Would like to get a copy of the report it is likely fatty liver. We can call in Butrans 71mg #4 one patch per week, no RF

## 2015-11-30 NOTE — Telephone Encounter (Signed)
Susan Cortez called and says that her PCP read her CT scan (done 9 months ago) and she has mild hepatic (last word unintelligible) and said she should not be taking Tylenol, and she is taking Tylenol #4.  Please advise.

## 2015-12-05 ENCOUNTER — Encounter: Payer: Self-pay | Admitting: Physical Medicine & Rehabilitation

## 2015-12-05 ENCOUNTER — Other Ambulatory Visit: Payer: Self-pay

## 2015-12-08 ENCOUNTER — Ambulatory Visit: Payer: Medicare Other | Admitting: Family Medicine

## 2015-12-23 ENCOUNTER — Telehealth: Payer: Self-pay | Admitting: Behavioral Health

## 2015-12-23 NOTE — Telephone Encounter (Signed)
Unable to reach patient at time of Pre-Visit Call.  Left message for patient to return call when available.    

## 2015-12-26 ENCOUNTER — Other Ambulatory Visit (HOSPITAL_COMMUNITY)
Admission: RE | Admit: 2015-12-26 | Discharge: 2015-12-26 | Disposition: A | Discharge status: Home / Self Care | Payer: PPO | Source: Ambulatory Visit | Attending: Family Medicine | Admitting: Family Medicine

## 2015-12-26 ENCOUNTER — Encounter: Payer: Self-pay | Admitting: Family Medicine

## 2015-12-26 ENCOUNTER — Ambulatory Visit (INDEPENDENT_AMBULATORY_CARE_PROVIDER_SITE_OTHER): Payer: PPO | Admitting: Family Medicine

## 2015-12-26 VITALS — BP 130/78 | HR 88 | Temp 98.4°F | Ht 68.5 in | Wt 218.4 lb

## 2015-12-26 DIAGNOSIS — Z01419 Encounter for gynecological examination (general) (routine) without abnormal findings: Secondary | ICD-10-CM | POA: Insufficient documentation

## 2015-12-26 DIAGNOSIS — Z0000 Encounter for general adult medical examination without abnormal findings: Secondary | ICD-10-CM | POA: Diagnosis not present

## 2015-12-26 DIAGNOSIS — K5909 Other constipation: Secondary | ICD-10-CM

## 2015-12-26 DIAGNOSIS — Z7189 Other specified counseling: Secondary | ICD-10-CM

## 2015-12-26 DIAGNOSIS — Z1329 Encounter for screening for other suspected endocrine disorder: Secondary | ICD-10-CM | POA: Diagnosis not present

## 2015-12-26 DIAGNOSIS — Z119 Encounter for screening for infectious and parasitic diseases, unspecified: Secondary | ICD-10-CM | POA: Diagnosis not present

## 2015-12-26 DIAGNOSIS — K5900 Constipation, unspecified: Secondary | ICD-10-CM

## 2015-12-26 DIAGNOSIS — Z7689 Persons encountering health services in other specified circumstances: Secondary | ICD-10-CM

## 2015-12-26 DIAGNOSIS — I739 Peripheral vascular disease, unspecified: Secondary | ICD-10-CM

## 2015-12-26 DIAGNOSIS — Z124 Encounter for screening for malignant neoplasm of cervix: Secondary | ICD-10-CM | POA: Diagnosis not present

## 2015-12-26 DIAGNOSIS — Z131 Encounter for screening for diabetes mellitus: Secondary | ICD-10-CM | POA: Diagnosis not present

## 2015-12-26 DIAGNOSIS — Z13220 Encounter for screening for lipoid disorders: Secondary | ICD-10-CM

## 2015-12-26 DIAGNOSIS — Z130 Encounter for screening for diseases of the blood and blood-forming organs and certain disorders involving the immune mechanism: Secondary | ICD-10-CM | POA: Diagnosis not present

## 2015-12-26 DIAGNOSIS — Z1151 Encounter for screening for human papillomavirus (HPV): Secondary | ICD-10-CM | POA: Insufficient documentation

## 2015-12-26 DIAGNOSIS — D3502 Benign neoplasm of left adrenal gland: Secondary | ICD-10-CM | POA: Insufficient documentation

## 2015-12-26 LAB — LIPID PANEL
CHOL/HDL RATIO: 5
Cholesterol: 239 mg/dL — ABNORMAL HIGH (ref 0–200)
HDL: 50.7 mg/dL (ref >39.00)
LDL CALC: 167 mg/dL — AB (ref 0–99)
NONHDL: 187.94
Triglycerides: 103 mg/dL (ref 0.0–149.0)
VLDL: 20.6 mg/dL (ref 0.0–40.0)

## 2015-12-26 LAB — CBC
HCT: 41.5 % (ref 36.0–46.0)
Hemoglobin: 14 g/dL (ref 12.0–15.0)
MCHC: 33.7 g/dL (ref 30.0–36.0)
MCV: 91.1 fl (ref 78.0–100.0)
PLATELETS: 391 10*3/uL (ref 150.0–400.0)
RBC: 4.56 Mil/uL (ref 3.87–5.11)
RDW: 14.1 % (ref 11.5–15.5)
WBC: 12 10*3/uL — ABNORMAL HIGH (ref 4.0–10.5)

## 2015-12-26 LAB — COMPREHENSIVE METABOLIC PANEL
ALT: 19 U/L (ref 0–35)
AST: 17 U/L (ref 0–37)
Albumin: 4.4 g/dL (ref 3.5–5.2)
Alkaline Phosphatase: 45 U/L (ref 39–117)
BILIRUBIN TOTAL: 0.4 mg/dL (ref 0.2–1.2)
BUN: 5 mg/dL — AB (ref 6–23)
CALCIUM: 9.5 mg/dL (ref 8.4–10.5)
CHLORIDE: 106 meq/L (ref 96–112)
CO2: 28 meq/L (ref 19–32)
CREATININE: 0.65 mg/dL (ref 0.40–1.20)
GFR: 100.14 mL/min (ref >60.00)
Glucose, Bld: 107 mg/dL — ABNORMAL HIGH (ref 70–99)
Potassium: 4.2 mEq/L (ref 3.5–5.1)
SODIUM: 141 meq/L (ref 135–145)
Total Protein: 7.2 g/dL (ref 6.0–8.3)

## 2015-12-26 LAB — TSH: TSH: 1.63 u[IU]/mL (ref 0.35–4.50)

## 2015-12-26 LAB — HEMOGLOBIN A1C: HEMOGLOBIN A1C: 5.8 % (ref 4.6–6.5)

## 2015-12-26 LAB — HEPATITIS C ANTIBODY: HCV AB: NEGATIVE

## 2015-12-26 MED ORDER — POLYETHYLENE GLYCOL 3350 17 GM/SCOOP PO POWD: 17.0000 g | Freq: Two times a day (BID) | ORAL | Status: DC | PRN

## 2015-12-26 NOTE — Progress Notes (Signed)
Pre visit review using our clinic tool,if applicable. No additional management support is needed unless otherwise documented below in the visit note.  

## 2015-12-26 NOTE — Patient Instructions (Signed)
It was very nice to see you today!  I will contact the radiologist who read your CT last year and find out if anything further is needed for follow-up I will be in touch with your labs Depending on your labs, we can plan to recheck in 6-12 months Please do work on quitting smoking!  Please sign a records release at the front on your way out

## 2015-12-26 NOTE — Progress Notes (Signed)
[facility] [address] [place], [place] [address] [phone]  Date:  12/26/2015   Name:  Susan Cortez   DOB:  1960/10/18   MRN:  [id number]  PCP:  Rodena Medin, MD    Chief Complaint: Establish Care   History of Present Illness:  Susan Cortez is a 55 y.o. very pleasant female patient who presents with the following:  Establishing care with [place] as PCP today. History of cervical spinal stenosis, chronic painis managed by Dr. Letta Pate with tylenol #3.  She is originally from [place].  She is disabled She has 3 children and 1 grandson who lives in [place]- this is why she moved to this area.   She is fasting today. Last had labs a little over a year ago  She is a smoker Last mammo: just done 2 months ago, February Last colon: done 2016,she has every 3 years due to polyps.  [id number] Pap smear: 5 or 6 years ago, 2011.  Never had an abnormal.  She does have diverticulosis but never had diverticulitis.   Patient Active Problem List   Diagnosis Date Noted  . Rotator cuff (capsule) sprain 03/26/2014  . Spinal stenosis in cervical region 03/26/2014  . Chronic pain 03/26/2014    Past Medical History  Diagnosis Date  . Arthritis   . Allergy     Past Surgical History  Procedure Laterality Date  . Cesarean section    . Shoulder arthroscopy with open rotator cuff repair  05/2009  . Tubal ligation    . Tonsillectomy  1977    Social History  Substance Use Topics  . Smoking status: Current Every Day Smoker  . Smokeless tobacco: None  . Alcohol Use: Yes    Family History  Problem Relation Age of Onset  . Diabetes Father   . Heart disease Father   . Hyperlipidemia Father     Allergies  Allergen Reactions  . Diclofenac Swelling  . Meloxicam Swelling  . Lyrica [Pregabalin]     Headache,severe eye pain, swelling  . Nsaids     Stomach pain    Medication list has been reviewed and updated.  Current  Outpatient Prescriptions on File Prior to Visit  Medication Sig Dispense Refill  . buprenorphine (BUTRANS - DOSED MCG/HR) 5 MCG/HR PTWK patch Place 1 patch (5 mcg total) onto the skin once a week. (Patient not taking: Reported on 12/26/2015) 4 patch 0   No current facility-administered medications on file prior to visit.    Review of Systems:  As per HPI- otherwise negative.   Physical Examination: Filed Vitals:   12/26/15 0829  BP: 130/78  Pulse: 88  Temp: 98.4 F (36.9 C)   Filed Vitals:   12/26/15 0829  Height: 5' 8.5" (1.74 m)  Weight: 218 lb 6.4 oz (99.066 kg)   Body mass index is 32.72 kg/(m^2). Ideal Body Weight: Weight in (lb) to have BMI = 25: 166.5  GEN: WDWN, NAD, Non-toxic, A & O x 3, obese, looks well, tobacco odor.   HEENT: Atraumatic, Normocephalic. Neck supple. No masses, No LAD.  Bilateral TM wnl, oropharynx normal.  PEERL,EOMI.   Ears and Nose: No external deformity. CV: RRR, No M/G/R. No JVD. No thrill. No extra heart sounds. PULM: CTA B, no wheezes, crackles, rhonchi. No retractions. No resp. distress. No accessory muscle use. ABD: S, NT, ND, +BS. No rebound. No HSM. EXTR: No c/c/e NEURO Normal gait.  PSYCH: Normally interactive.  Conversant. Not depressed or anxious appearing.  Calm demeanor.  Pelvic: normal, no vaginal lesions or discharge. Uterus normal, no CMT, no adnexal tendereness or masses  Assessment and Plan: Physical exam  Establishing care with new doctor, encounter for  Screening for cervical cancer - Plan: Cytology - PAP  Screening for hyperlipidemia - Plan: Lipid panel  Screening for thyroid disorder - Plan: TSH  Screening for deficiency anemia - Plan: CBC  Screening for diabetes mellitus - Plan: Comprehensive metabolic panel, Hemoglobin A1c  Screening examination for infectious disease - Plan: Hepatitis C antibody  Chronic constipation - Plan: polyethylene glycol powder (GLYCOLAX/MIRALAX) powder  Adenoma of left adrenal  gland  Intermittent claudication ([place])  Labs and pap as above  She had a CT last year of her adrenal glands and was a bit concerned about the results. Will check with the reading radiologist to ensure no further follow-up is needed  miralax for constipation due to opoid use Will be in touch with her labs asap Encouraged her to quit smoking and she plans to try  Signed Lamar Blinks, MD

## 2015-12-27 LAB — CYTOLOGY - PAP

## 2016-01-02 ENCOUNTER — Ambulatory Visit (HOSPITAL_BASED_OUTPATIENT_CLINIC_OR_DEPARTMENT_OTHER): Payer: PPO | Admitting: Physical Medicine & Rehabilitation

## 2016-01-02 ENCOUNTER — Encounter: Payer: Self-pay | Admitting: Physical Medicine & Rehabilitation

## 2016-01-02 ENCOUNTER — Encounter: Payer: PPO | Attending: Physical Medicine & Rehabilitation

## 2016-01-02 VITALS — BP 125/52 | HR 67 | Resp 14

## 2016-01-02 DIAGNOSIS — M25511 Pain in right shoulder: Secondary | ICD-10-CM

## 2016-01-02 DIAGNOSIS — Z79899 Other long term (current) drug therapy: Secondary | ICD-10-CM | POA: Diagnosis not present

## 2016-01-02 DIAGNOSIS — M545 Low back pain: Secondary | ICD-10-CM | POA: Diagnosis not present

## 2016-01-02 DIAGNOSIS — G8929 Other chronic pain: Secondary | ICD-10-CM | POA: Insufficient documentation

## 2016-01-02 DIAGNOSIS — G894 Chronic pain syndrome: Secondary | ICD-10-CM | POA: Diagnosis not present

## 2016-01-02 DIAGNOSIS — M4802 Spinal stenosis, cervical region: Secondary | ICD-10-CM

## 2016-01-02 DIAGNOSIS — Z5181 Encounter for therapeutic drug level monitoring: Secondary | ICD-10-CM | POA: Insufficient documentation

## 2016-01-02 MED ORDER — ACETAMINOPHEN-CODEINE #4 300-60 MG PO TABS: 1.0000 | ORAL_TABLET | Freq: Every day | ORAL | Status: DC

## 2016-01-02 NOTE — Progress Notes (Signed)
Subjective:    Patient ID: Susan Cortez, female    DOB: 10/06/1960, 55 y.o.   MRN: [id number] Initially was treated for neck pain, then was evaluated for Right shoulder pain.   Xray report 10/19/2008- C5-C6 and C6-C7 degenerative disc and spondylosis causing moderate to severe bilateral neural foraminal encroachment C6-C7 greater than C5-C6   Initially underwent physical therapy working both on shoulder and neck. This was not helpful, postop shoulder rehabilitation was helpful   Cervical MRI was performed. Orthopedic spine surgeon did not feel that surgery would be helpful.   Cervical epidural injection was not helpful.   EMG bilateral upper limbs performed on 3/09-2008 was normal   Right shoulder MRI 03/09/2009 ordered by orthopedics shoulder surgeon. Full thickness tear supraspinatus and partial tear infraspinatus down sloping acromion.   HPI No further hip pain after cortisone injection Still maintain complaint is neck pain and right shoulder pain.  Still gets good relief with Tylenol with codeine. She has had some complaints of not being as sharp as usual mentally. She has not changed her dose of the Tylenol with Codeine and she has not started any new medicines. She was evaluated by her primary care physician and lab work looked okay. She is waking up 3-4 times a night for no apparent reason. Sleeps better in a recliner.  Independent with all her self-care mobility, housework and cooking. Patient feels groggy at times during the day. She is wondering if it could be her medication.  Pain Inventory Average Pain 6 Pain Right Now 7 My pain is constant, burning, dull, stabbing and aching  In the last 24 hours, has pain interfered with the following? General activity 5 Relation with others 5 Enjoyment of life 5 What TIME of day is your pain at its worst? daytime, evening Sleep (in general) Fair  Pain is worse with: walking, bending, standing and some activites Pain improves with:  rest, heat/ice and medication Relief from Meds: 7  Mobility walk without assistance how many minutes can you walk? 15 ability to climb steps?  yes do you drive?  yes Do you have any goals in this area?  no  Function disabled: date disabled . Do you have any goals in this area?  no  Neuro/Psych No problems in this area  Prior Studies Any changes since last visit?  no  Physicians involved in your care Any changes since last visit?  no   Family History  Problem Relation Age of Onset  . Diabetes Father   . Heart disease Father   . Hyperlipidemia Father    Social History   Social History  . Marital Status: Divorced    Spouse Name: N/A  . Number of Children: N/A  . Years of Education: N/A   Social History Main Topics  . Smoking status: Current Every Day Smoker  . Smokeless tobacco: None  . Alcohol Use: Yes  . Drug Use: None  . Sexual Activity: Not Asked   Other Topics Concern  . None   Social History Narrative   Past Surgical History  Procedure Laterality Date  . Cesarean section    . Shoulder arthroscopy with open rotator cuff repair  05/2009  . Tubal ligation    . Tonsillectomy  1977   Past Medical History  Diagnosis Date  . Arthritis   . Allergy    BP 125/52 mmHg  Pulse 67  Resp 14  SpO2 98%  Opioid Risk Score:   Fall Risk Score:  `1  Depression  screen PHQ 2/9  Depression screen [facility] 2/9 12/26/2015 05/05/2015 11/22/2014  Decreased Interest 0 3 3  Down, Depressed, Hopeless 0 0 0  PHQ - 2 Score 0 3 3  Altered sleeping - - 1  Tired, decreased energy - - 3  Change in appetite - - 0  Feeling bad or failure about yourself  - - 0  Trouble concentrating - - 2  Moving slowly or fidgety/restless - - 0  Suicidal thoughts - - 0  PHQ-9 Score - - 9     Review of Systems  Constitutional: Positive for diaphoresis and unexpected weight change.  Cardiovascular: Positive for leg swelling.  Gastrointestinal: Positive for constipation.  Skin: Positive for  rash.  All other systems reviewed and are negative.      Objective:   Physical Exam  Constitutional: She is oriented to person, place, and time. She appears well-developed and well-nourished.  HENT:  Head: Normocephalic and atraumatic.  Eyes: Conjunctivae and EOM are normal. Pupils are equal, round, and reactive to light.  Neck: Normal range of motion.  Neurological: She is alert and oriented to person, place, and time. She displays no atrophy. No sensory deficit. Coordination normal.  Reflex Scores:      Tricep reflexes are 2+ on the right side and 2+ on the left side.      Bicep reflexes are 2+ on the right side and 2+ on the left side.      Brachioradialis reflexes are 2+ on the right side and 2+ on the left side.      Patellar reflexes are 2+ on the right side and 2+ on the left side.      Achilles reflexes are 0 on the right side and 0 on the left side. Skin: Skin is warm and dry.  Psychiatric: She has a normal mood and affect.  Nursing note and vitals reviewed.  Limited right shoulder internal rotation Normal external rotation Limited right shoulder abduction  Upper extremity strength is normal Lower extremity strength is normal  Sensation normal to pinprick in the upper and lower extremities       Assessment & Plan:  1. Cervical spinal stenosis without evidence of radiculopathy. Chronic neck pain , Continue Tylenol No. 4 one tablet per day 2. Right shoulder rotator cuff tear, status post repair with chronic pain and contracture, Continue Tylenol No. 4 one tablet per day Continue opioid monitoring program. This consists of regular clinic visits, examinations, urine drug screen, pill counts as well as use of [place] controlled substance reporting System.  3. Insomnia , She is not clearly waking up at night due to pain. She states she sleeps better in a recliner than in her bed but once again does not complain of pain waking her up. Her grogginess during the day may  be related to chronic sleep issues and may benefit from further workup.

## 2016-01-02 NOTE — Patient Instructions (Signed)
Try to stay as active as possible, walk 10 minutes 3 times per day

## 2016-01-10 ENCOUNTER — Telehealth: Payer: Self-pay | Admitting: *Deleted

## 2016-01-10 NOTE — Telephone Encounter (Signed)
Medical records received and forwarded to Dr. Liana Crocker. JG//CMA

## 2016-01-14 ENCOUNTER — Encounter: Payer: Self-pay | Admitting: Family Medicine

## 2016-01-27 ENCOUNTER — Encounter: Payer: Self-pay | Admitting: Family Medicine

## 2016-03-21 ENCOUNTER — Encounter: Payer: Self-pay | Admitting: Family Medicine

## 2016-03-21 ENCOUNTER — Encounter: Payer: Self-pay | Admitting: Medical

## 2016-03-21 ENCOUNTER — Ambulatory Visit (INDEPENDENT_AMBULATORY_CARE_PROVIDER_SITE_OTHER): Payer: PPO | Admitting: Medical

## 2016-03-21 VITALS — BP 126/78 | HR 65 | Temp 98.1°F | Ht 68.5 in | Wt 219.6 lb

## 2016-03-21 DIAGNOSIS — J0100 Acute maxillary sinusitis, unspecified: Secondary | ICD-10-CM

## 2016-03-21 DIAGNOSIS — L989 Disorder of the skin and subcutaneous tissue, unspecified: Secondary | ICD-10-CM | POA: Diagnosis not present

## 2016-03-21 DIAGNOSIS — J309 Allergic rhinitis, unspecified: Secondary | ICD-10-CM | POA: Diagnosis not present

## 2016-03-21 DIAGNOSIS — H00013 Hordeolum externum right eye, unspecified eyelid: Secondary | ICD-10-CM | POA: Diagnosis not present

## 2016-03-21 MED ORDER — AZELASTINE HCL 0.1 % NA SOLN: 2.0000 | Freq: Two times a day (BID) | NASAL | 3 refills | Status: DC

## 2016-03-21 MED ORDER — LEVOCETIRIZINE DIHYDROCHLORIDE 5 MG PO TABS: 5.0000 mg | ORAL_TABLET | Freq: Every evening | ORAL | 3 refills | Status: DC

## 2016-03-21 MED ORDER — TOBRAMYCIN 0.3 % OP SOLN: 2.0000 [drp] | Freq: Four times a day (QID) | OPHTHALMIC | 0 refills | Status: DC

## 2016-03-21 MED ORDER — MONTELUKAST SODIUM 10 MG PO TABS: 10.0000 mg | ORAL_TABLET | Freq: Every day | ORAL | 3 refills | Status: DC

## 2016-03-21 MED ORDER — AMOXICILLIN-POT CLAVULANATE 875-125 MG PO TABS: 1.0000 | ORAL_TABLET | Freq: Two times a day (BID) | ORAL | 0 refills | Status: DC

## 2016-03-21 NOTE — Progress Notes (Signed)
Pre visit review using our clinic review tool, if applicable. No additional management support is needed unless otherwise documented below in the visit note. 

## 2016-03-21 NOTE — Patient Instructions (Signed)
For your allergies will rx xyzal, singulair and astelin.  For sinus infection rx augmentin.  For stye rx tobrex and recommend warm compresses twice daily.  For skin lesion near rt eye will go ahead and refer to ophthalmologist.  Follow up in 10-14 days or as needed

## 2016-03-21 NOTE — Progress Notes (Signed)
Subjective:    Patient ID: Susan Cortez, female    DOB: 1961/04/26, 55 y.o.   MRN: [id number]  HPI  Pt in for some recent sinus pressure for 2 weeks. Past week some upper teeth pain. Pt had some sneezing and itching eyes. Pt has allergies outdoor seasonal(year round) and to dogs/cats.  Pt states varius allergy meds in past and immune therapy did not help in the past. But on review never tried xyzal, astelin or singulair.  Rt eye upper lid started swelling since Sunday. Since Sunday dc and matting in am.  Pt has small lesion near rt eye present for 4 yrs. Irritating. She went to ENT and he did not want to remove. Pt willing to see eye MD.    Review of Systems  Constitutional: Negative for chills, fatigue and fever.  HENT: Positive for congestion, postnasal drip, rhinorrhea, sinus pressure and sneezing. Negative for ear pain, nosebleeds and sore throat.   Eyes: Positive for discharge and redness. Negative for pain and visual disturbance.       Just recently.  Respiratory: Negative for cough, chest tightness, shortness of breath and wheezing.   Cardiovascular: Negative for chest pain and palpitations.  Gastrointestinal: Negative for abdominal pain and blood in stool.  Skin: Negative for rash.  Neurological: Negative for dizziness and headaches.  Hematological: Negative for adenopathy. Does not bruise/bleed easily.  Psychiatric/Behavioral: Negative for behavioral problems, confusion and decreased concentration.    Past Medical History:  Diagnosis Date  . Allergy   . Arthritis      Social History   Social History  . Marital status: Divorced    Spouse name: N/A  . Number of children: N/A  . Years of education: N/A   Occupational History  . Not on file.   Social History Main Topics  . Smoking status: Current Every Day Smoker  . Smokeless tobacco: Not on file  . Alcohol use Yes  . Drug use: Unknown  . Sexual activity: Not on file   Other Topics Concern  . Not on file     Social History Narrative  . No narrative on file    Past Surgical History:  Procedure Laterality Date  . CESAREAN SECTION    . SHOULDER ARTHROSCOPY WITH OPEN ROTATOR CUFF REPAIR  05/2009  . TONSILLECTOMY  1977  . TUBAL LIGATION      Family History  Problem Relation Age of Onset  . Diabetes Father   . Heart disease Father   . Hyperlipidemia Father     Allergies  Allergen Reactions  . Diclofenac Swelling  . Meloxicam Swelling  . Butrans [Buprenorphine] Itching    Itching and redness at the area of the patch.  Recardo Evangelist [Pregabalin]     Headache,severe eye pain, swelling  . Nsaids     Stomach pain    Current Outpatient Prescriptions on File Prior to Visit  Medication Sig Dispense Refill  . acetaminophen-codeine (TYLENOL #4) 300-60 MG tablet Take 1 tablet by mouth daily. 30 tablet 2  . polyethylene glycol powder (GLYCOLAX/MIRALAX) powder Take 17 g by mouth 2 (two) times daily as needed. 3350 g 6   No current facility-administered medications on file prior to visit.     BP 126/78 (BP Location: Left Arm, Patient Position: Sitting, Cuff Size: Normal)   Pulse 65   Temp 98.1 F (36.7 C) (Oral)   Ht 5' 8.5" (1.74 m)   Wt 219 lb 9.6 oz (99.6 kg)   SpO2 97%   BMI  32.90 kg/m       Objective:   Physical Exam  General  Mental Status - Alert. General Appearance - Well groomed. Not in acute distress.  Skin Rashes- No Rashes.  HEENT Head- Normal. Ear Auditory Canal - Left- Normal. Right - Normal.Tympanic Membrane- Left- Normal. Right- Normal. Eye Sclera/Conjunctiva- Left- Normal. Right- faint red. Rt upper lid stye present.  Nose & Sinuses Nasal Mucosa- Left-  Boggy and Congested. Right-  Boggy and  Congested.Bilateral maxillary and frontal sinus pressure. Mouth & Throat Lips: Upper Lip- Normal: no dryness, cracking, pallor, cyanosis, or vesicular eruption. Lower Lip-Normal: no dryness, cracking, pallor, cyanosis or vesicular eruption. Buccal Mucosa- Bilateral-  No Aphthous ulcers. Oropharynx- No Discharge or Erythema. Tonsils: Characteristics- Bilateral- No Erythema or Congestion. Size/Enlargement- Bilateral- No enlargement. Discharge- bilateral-None.  Skin- near medial canthus area rt side small raised lesion.  Neck Neck- Supple. No Masses.   Chest and Lung Exam Auscultation: Breath Sounds:-Clear even and unlabored.  Cardiovascular Auscultation:Rythm- Regular, rate and rhythm. Murmurs & Other Heart Sounds:Ausculatation of the heart reveal- No Murmurs.  Lymphatic Head & Neck General Head & Neck Lymphatics: Bilateral: Description- No Localized lymphadenopathy.       Assessment & Plan:  For your allergies will rx xyzal, singulair and astelin.  For sinus infection rx augmentin.  For stye rx tobrex and recommend warm compresses twice daily.  For skin lesion near rt eye will go ahead and refer to ophthalmologist.  Follow up in 10-14 days or as needed  Clenton Esper, Percell Miller, [employer]

## 2016-04-03 ENCOUNTER — Encounter: Payer: Self-pay | Admitting: Registered Nurse

## 2016-04-03 ENCOUNTER — Encounter: Payer: PPO | Attending: Physical Medicine & Rehabilitation | Admitting: Registered Nurse

## 2016-04-03 VITALS — BP 115/73 | HR 90

## 2016-04-03 DIAGNOSIS — Z5181 Encounter for therapeutic drug level monitoring: Secondary | ICD-10-CM

## 2016-04-03 DIAGNOSIS — M545 Low back pain: Secondary | ICD-10-CM | POA: Insufficient documentation

## 2016-04-03 DIAGNOSIS — G8929 Other chronic pain: Secondary | ICD-10-CM | POA: Insufficient documentation

## 2016-04-03 DIAGNOSIS — G894 Chronic pain syndrome: Secondary | ICD-10-CM | POA: Diagnosis not present

## 2016-04-03 DIAGNOSIS — Z79899 Other long term (current) drug therapy: Secondary | ICD-10-CM | POA: Insufficient documentation

## 2016-04-03 DIAGNOSIS — M4802 Spinal stenosis, cervical region: Secondary | ICD-10-CM | POA: Insufficient documentation

## 2016-04-03 DIAGNOSIS — M25511 Pain in right shoulder: Secondary | ICD-10-CM

## 2016-04-03 MED ORDER — ACETAMINOPHEN-CODEINE #4 300-60 MG PO TABS: 1.0000 | ORAL_TABLET | Freq: Every day | ORAL | 2 refills | Status: DC

## 2016-04-03 NOTE — Progress Notes (Signed)
Subjective:    Patient ID: Susan Cortez, female    DOB: 1961/08/04, 55 y.o.   MRN: [id number]  HPI:  [name] is a 55year old female who returns for follow up appointment for chronic pain and medication refill. She states her pain is located in her neck radiating into her right shoulder.She rates her pain 7. Her current exercise regime is walking and water aerobics 2-3 times a week.  Pain Inventory Average Pain 6 Pain Right Now 7 My pain is burning, stabbing and aching  In the last 24 hours, has pain interfered with the following? General activity 5 Relation with others 5 Enjoyment of life 5 What TIME of day is your pain at its worst? evening Sleep (in general) Poor  Pain is worse with: walking, bending, standing and some activites Pain improves with: rest, heat/ice, pacing activities and medication Relief from Meds: 5  Mobility how many minutes can you walk? 20 ability to climb steps?  yes do you drive?  yes  Function disabled: date disabled .  Neuro/Psych No problems in this area  Prior Studies Any changes since last visit?  no  Physicians involved in your care Any changes since last visit?  no   Family History  Problem Relation Age of Onset  . Diabetes Father   . Heart disease Father   . Hyperlipidemia Father    Social History   Social History  . Marital status: Divorced    Spouse name: N/A  . Number of children: N/A  . Years of education: N/A   Social History Main Topics  . Smoking status: Current Every Day Smoker  . Smokeless tobacco: None  . Alcohol use Yes  . Drug use: Unknown  . Sexual activity: Not Asked   Other Topics Concern  . None   Social History Narrative  . None   Past Surgical History:  Procedure Laterality Date  . CESAREAN SECTION    . SHOULDER ARTHROSCOPY WITH OPEN ROTATOR CUFF REPAIR  05/2009  . TONSILLECTOMY  1977  . TUBAL LIGATION     Past Medical History:  Diagnosis Date  . Allergy   . Arthritis    BP  115/73 (BP Location: Right Arm, Patient Position: Sitting, Cuff Size: Normal)   Pulse 90   SpO2 98%   Opioid Risk Score:   Fall Risk Score:  `1  Depression screen PHQ 2/9  Depression screen [facility] 2/9 04/03/2016 12/26/2015 05/05/2015 11/22/2014  Decreased Interest 3 0 3 3  Down, Depressed, Hopeless 0 0 0 0  PHQ - 2 Score 3 0 3 3  Altered sleeping - - - 1  Tired, decreased energy - - - 3  Change in appetite - - - 0  Feeling bad or failure about yourself  - - - 0  Trouble concentrating - - - 2  Moving slowly or fidgety/restless - - - 0  Suicidal thoughts - - - 0  PHQ-9 Score - - - 9    Review of Systems  Constitutional: Positive for diaphoresis.  Gastrointestinal: Positive for constipation.  All other systems reviewed and are negative.      Objective:   Physical Exam  Constitutional: She is oriented to person, place, and time. She appears well-developed and well-nourished.  HENT:  Head: Normocephalic and atraumatic.  Neck: Normal range of motion. Neck supple.  Musculoskeletal:  Normal Muscle Bulk and Muscle Testing Reveals: Upper Extremities: Right: Decreased ROM 90 Degrees and Muscle Strength 3/5 Left: Full ROM and Muscle Strength  5/5 Right AC Joint Tenderness Thoracic Paraspinal Tenderness: T-1- T-3 Lower Extremities: Full ROM and Muscle Strength 5/5 Arises from Table with ease Narrow Based Gait  Neurological: She is alert and oriented to person, place, and time.  Skin: Skin is warm and dry.  Psychiatric: She has a normal mood and affect.  Nursing note and vitals reviewed.         Assessment & Plan:  1.Cervical spinal stenosis without evidence of radiculopathy. Chronic neck pain: Refilled Tylenol #4 300/'60mg'$  daily HS.#30. 2. Right shoulder rotator cuff tear, status post repair with chronic pain and contracture : Continue with exercise regime 3. Insomnia: No complaints today.  20 minutes of face to face patient care time was spent during this visit. All questions  was encouraged and answered.  F/U 3 months

## 2016-04-10 LAB — 6-ACETYLMORPHINE,TOXASSURE ADD
6-ACETYLMORPHINE: NEGATIVE
6-ACETYLMORPHINE: NOT DETECTED ng/mg{creat}

## 2016-04-10 LAB — TOXASSURE SELECT,+ANTIDEPR,UR: PDF: 0

## 2016-04-10 NOTE — Progress Notes (Signed)
Urine drug screen for this encounter is consistent for prescribed medications.   

## 2016-04-27 ENCOUNTER — Ambulatory Visit (INDEPENDENT_AMBULATORY_CARE_PROVIDER_SITE_OTHER): Payer: PPO | Admitting: Medical

## 2016-04-27 ENCOUNTER — Encounter: Payer: Self-pay | Admitting: Medical

## 2016-04-27 ENCOUNTER — Ambulatory Visit (HOSPITAL_BASED_OUTPATIENT_CLINIC_OR_DEPARTMENT_OTHER)
Admission: RE | Admit: 2016-04-27 | Discharge: 2016-04-27 | Disposition: A | Discharge status: Home / Self Care | Payer: PPO | Source: Ambulatory Visit | Attending: Medical | Admitting: Medical

## 2016-04-27 VITALS — BP 130/76 | HR 69 | Temp 98.2°F | Ht 69.0 in | Wt 221.6 lb

## 2016-04-27 DIAGNOSIS — S99922A Unspecified injury of left foot, initial encounter: Secondary | ICD-10-CM | POA: Diagnosis not present

## 2016-04-27 DIAGNOSIS — M25572 Pain in left ankle and joints of left foot: Secondary | ICD-10-CM | POA: Insufficient documentation

## 2016-04-27 DIAGNOSIS — L989 Disorder of the skin and subcutaneous tissue, unspecified: Secondary | ICD-10-CM | POA: Diagnosis not present

## 2016-04-27 DIAGNOSIS — M79672 Pain in left foot: Secondary | ICD-10-CM | POA: Diagnosis not present

## 2016-04-27 NOTE — Progress Notes (Signed)
Subjective:    Patient ID: Susan Cortez, female    DOB: 10/30/1960, 55 y.o.   MRN: [id number]  HPI   Pt in with some foot pain. Mid foot top aspect pain for 3 weeks. She stepped in whole about 3 weeks ago. Curtain movement will hurt. Sharp occasional pain. Will cause her to limp for about an hour then pain subsides. But happening daily.    Review of Systems  Constitutional: Negative for chills, fatigue and fever.  Respiratory: Negative for cough, chest tightness, shortness of breath and wheezing.   Cardiovascular: Negative for chest pain and palpitations.  Gastrointestinal: Negative for abdominal pain.  Musculoskeletal: Negative for back pain.       Foot pain  Skin: Negative for rash.  Neurological: Negative for dizziness and headaches.  Hematological: Negative for adenopathy. Does not bruise/bleed easily.  Psychiatric/Behavioral: Negative for behavioral problems and confusion.    Past Medical History:  Diagnosis Date  . Allergy   . Arthritis      Social History   Social History  . Marital status: Divorced    Spouse name: N/A  . Number of children: N/A  . Years of education: N/A   Occupational History  . Not on file.   Social History Main Topics  . Smoking status: Current Every Day Smoker  . Smokeless tobacco: Not on file  . Alcohol use Yes  . Drug use: Unknown  . Sexual activity: Not on file   Other Topics Concern  . Not on file   Social History Narrative  . No narrative on file    Past Surgical History:  Procedure Laterality Date  . CESAREAN SECTION    . SHOULDER ARTHROSCOPY WITH OPEN ROTATOR CUFF REPAIR  05/2009  . TONSILLECTOMY  1977  . TUBAL LIGATION      Family History  Problem Relation Age of Onset  . Diabetes Father   . Heart disease Father   . Hyperlipidemia Father     Allergies  Allergen Reactions  . Diclofenac Swelling  . Meloxicam Swelling  . Butrans [Buprenorphine] Itching    Itching and redness at the area of the patch.  Recardo Evangelist [Pregabalin]     Headache,severe eye pain, swelling  . Nsaids     Stomach pain  . Tolmetin Other (See Comments)    Stomach pain    Current Outpatient Prescriptions on File Prior to Visit  Medication Sig Dispense Refill  . acetaminophen-codeine (TYLENOL #4) 300-60 MG tablet Take 1 tablet by mouth daily. 30 tablet 2  . azelastine (ASTELIN) 0.1 % nasal spray Place 2 sprays into both nostrils 2 (two) times daily. Use in each nostril as directed (Patient not taking: Reported on 04/27/2016) 30 mL 3  . levocetirizine (XYZAL) 5 MG tablet Take 1 tablet (5 mg total) by mouth every evening. (Patient not taking: Reported on 04/27/2016) 30 tablet 3  . polyethylene glycol powder (GLYCOLAX/MIRALAX) powder Take 17 g by mouth 2 (two) times daily as needed. (Patient not taking: Reported on 04/27/2016) 3350 g 6  . tobramycin (TOBREX) 0.3 % ophthalmic solution Place 2 drops into the right eye every 6 (six) hours. (Patient not taking: Reported on 04/27/2016) 5 mL 0   No current facility-administered medications on file prior to visit.     BP 130/76   Pulse 69   Temp 98.2 F (36.8 C) (Oral)   Ht '5\' 9"'$  (1.753 m)   Wt 221 lb 9.6 oz (100.5 kg)   SpO2 97%   BMI  32.72 kg/m       Objective:   Physical Exam  General- No acute distress. Pleasant patient. Lungs- Clear, even and unlabored. Heart- RRR Heart- regular rate and rhythm. Neurologic- CNII- XII grossly intact.  Left foot- mid metartasal pain on palpation. No bruise.  Lt ankle- no pain on palpation. Lt heel- no pain on palpation.  Skin- 8 mm. Lt dorsal wrist lesion.(pt expresses concern) Mid back bra line. irreguar shaped lesion as well.        Assessment & Plan:  For your left foot pain will get xray. Use ibuprofen for pain if needed and ace wrap. If fx seen refer to sports med or ortho .  For skin lesions will get opinion of dermatologist.  Follow up as 7-10 days or as needed  Abbagail Scaff, Percell Miller, [employer]

## 2016-04-27 NOTE — Patient Instructions (Signed)
For your left foot pain will get xray. Use ibuprofen for pain if needed and ace wrap. If fx seen refer to sports med or ortho .  For skin lesions will get opinion of dermatologist.  Follow up as 7-10 days or as needed

## 2016-04-27 NOTE — Progress Notes (Signed)
Pre visit review using our clinic tool,if applicable. No additional management support is needed unless otherwise documented below in the visit note.  

## 2016-05-28 DIAGNOSIS — S50901A Unspecified superficial injury of right elbow, initial encounter: Secondary | ICD-10-CM | POA: Diagnosis not present

## 2016-05-28 DIAGNOSIS — L818 Other specified disorders of pigmentation: Secondary | ICD-10-CM | POA: Diagnosis not present

## 2016-05-28 DIAGNOSIS — L814 Other melanin hyperpigmentation: Secondary | ICD-10-CM | POA: Diagnosis not present

## 2016-05-28 DIAGNOSIS — L821 Other seborrheic keratosis: Secondary | ICD-10-CM | POA: Diagnosis not present

## 2016-06-01 DIAGNOSIS — D2311 Other benign neoplasm of skin of right eyelid, including canthus: Secondary | ICD-10-CM | POA: Diagnosis not present

## 2016-06-26 ENCOUNTER — Encounter: Payer: Self-pay | Admitting: Family Medicine

## 2016-06-26 DIAGNOSIS — L738 Other specified follicular disorders: Secondary | ICD-10-CM | POA: Insufficient documentation

## 2016-07-03 ENCOUNTER — Encounter: Payer: Self-pay | Admitting: Physical Medicine & Rehabilitation

## 2016-07-03 ENCOUNTER — Encounter: Payer: PPO | Attending: Physical Medicine & Rehabilitation

## 2016-07-03 ENCOUNTER — Ambulatory Visit (HOSPITAL_BASED_OUTPATIENT_CLINIC_OR_DEPARTMENT_OTHER): Payer: PPO | Admitting: Physical Medicine & Rehabilitation

## 2016-07-03 VITALS — BP 122/66 | HR 80 | Resp 14

## 2016-07-03 DIAGNOSIS — G8929 Other chronic pain: Secondary | ICD-10-CM | POA: Diagnosis not present

## 2016-07-03 DIAGNOSIS — M545 Low back pain: Secondary | ICD-10-CM | POA: Diagnosis not present

## 2016-07-03 DIAGNOSIS — M4802 Spinal stenosis, cervical region: Secondary | ICD-10-CM | POA: Insufficient documentation

## 2016-07-03 DIAGNOSIS — Z79899 Other long term (current) drug therapy: Secondary | ICD-10-CM | POA: Insufficient documentation

## 2016-07-03 DIAGNOSIS — G894 Chronic pain syndrome: Secondary | ICD-10-CM | POA: Insufficient documentation

## 2016-07-03 DIAGNOSIS — M25511 Pain in right shoulder: Secondary | ICD-10-CM

## 2016-07-03 DIAGNOSIS — Z5181 Encounter for therapeutic drug level monitoring: Secondary | ICD-10-CM | POA: Insufficient documentation

## 2016-07-03 MED ORDER — ACETAMINOPHEN-CODEINE #4 300-60 MG PO TABS: 1.0000 | ORAL_TABLET | Freq: Every day | ORAL | 2 refills | Status: DC

## 2016-07-03 NOTE — Progress Notes (Signed)
Subjective:    Patient ID: Susan Cortez, female    DOB: Jun 21, 1961, 55 y.o.   MRN: [id number]  HPI   Neck and Right shoulder pain No side effects from medication other than constipation She cares for her grandson and remains independent with all her self-care, mobility, she drives Pain Inventory Average Pain 7 Pain Right Now 6 My pain is burning, dull, stabbing and aching  In the last 24 hours, has pain interfered with the following? General activity 3 Relation with others 3 Enjoyment of life 8 What TIME of day is your pain at its worst? evening, night Sleep (in general) Poor  Pain is worse with: walking, standing and some activites Pain improves with: rest and medication Relief from Meds: 7  Mobility walk without assistance how many minutes can you walk? 20 ability to climb steps?  yes do you drive?  yes Do you have any goals in this area?  no  Function disabled: date disabled n/a Do you have any goals in this area?  no  Neuro/Psych No problems in this area  Prior Studies Any changes since last visit?  no  Physicians involved in your care Any changes since last visit?  no   Family History  Problem Relation Age of Onset  . Diabetes Father   . Heart disease Father   . Hyperlipidemia Father    Social History   Social History  . Marital status: Divorced    Spouse name: N/A  . Number of children: N/A  . Years of education: N/A   Social History Main Topics  . Smoking status: Current Every Day Smoker  . Smokeless tobacco: None  . Alcohol use Yes  . Drug use: Unknown  . Sexual activity: Not Asked   Other Topics Concern  . None   Social History Narrative  . None   Past Surgical History:  Procedure Laterality Date  . CESAREAN SECTION    . SHOULDER ARTHROSCOPY WITH OPEN ROTATOR CUFF REPAIR  05/2009  . TONSILLECTOMY  1977  . TUBAL LIGATION     Past Medical History:  Diagnosis Date  . Allergy   . Arthritis    BP 122/66   Pulse 80   Resp 14    SpO2 95%   Opioid Risk Score:   Fall Risk Score:  `1  Depression screen PHQ 2/9  Depression screen [facility] 2/9 04/03/2016 12/26/2015 05/05/2015 11/22/2014  Decreased Interest 3 0 3 3  Down, Depressed, Hopeless 0 0 0 0  PHQ - 2 Score 3 0 3 3  Altered sleeping - - - 1  Tired, decreased energy - - - 3  Change in appetite - - - 0  Feeling bad or failure about yourself  - - - 0  Trouble concentrating - - - 2  Moving slowly or fidgety/restless - - - 0  Suicidal thoughts - - - 0  PHQ-9 Score - - - 9     Review of Systems  Constitutional:       Night sweats  All other systems reviewed and are negative.      Objective:   Physical Exam  Constitutional: She is oriented to person, place, and time. She appears well-developed and well-nourished.  HENT:  Head: Normocephalic and atraumatic.  Eyes: Conjunctivae and EOM are normal. Pupils are equal, round, and reactive to light.  Neck:  Spine range of motion 50%, lateral bending bilaterally 75% flexion and extension, 50% rotation bilaterally  Neurological: She is alert and oriented to person,  place, and time.  Psychiatric: She has a normal mood and affect.  Nursing note and vitals reviewed.  Right shoulder abduction to 90 forward flexion to 90. Motor strength is 4/5 in the right deltoid, biceps, triceps, grip 5/5 in the left deltoid, bicep, tricep, grip Left upper extremity range of motion is full. Lower extremity strength is full bilateral hip flexors, knee extensors, ankle dorsi flexors. Gait is without evidence toe drag or knee instability      Assessment & Plan:  1. Cervical stenosis, chronic neck pain 2. Chronic right shoulder pain with contracture. History of rotator cuff syndrome  We'll continue Tylenol number 4 one tablet daily at bedtime #30 with 2 refills. Return to clinic in 3 months with nurse practitioner

## 2016-08-27 DIAGNOSIS — C4491 Basal cell carcinoma of skin, unspecified: Secondary | ICD-10-CM

## 2016-08-27 HISTORY — DX: Basal cell carcinoma of skin, unspecified: C44.91

## 2016-09-05 ENCOUNTER — Encounter: Payer: Self-pay | Admitting: *Deleted

## 2016-09-05 ENCOUNTER — Ambulatory Visit: Payer: PPO | Admitting: *Deleted

## 2016-09-05 ENCOUNTER — Ambulatory Visit (INDEPENDENT_AMBULATORY_CARE_PROVIDER_SITE_OTHER): Payer: PPO | Admitting: *Deleted

## 2016-09-05 VITALS — BP 130/78 | HR 88 | Resp 18 | Ht 69.0 in | Wt 221.6 lb

## 2016-09-05 DIAGNOSIS — Z0000 Encounter for general adult medical examination without abnormal findings: Secondary | ICD-10-CM

## 2016-09-05 NOTE — Patient Instructions (Addendum)
Ms. Thivierge , Thank you for taking time to come for your Medicare Wellness Visit. I appreciate your ongoing commitment to your health goals. Please review the following plan we discussed and let me know if I can assist you in the future.   Smokefree.gov for resources to help you quit.  Create your advance directives and bring a copy to your next office visit.  These are the goals we discussed: Goals    . Quit smoking / using tobacco    . Weight (lb) < 200 lb (90.7 kg)       This is a list of the screening recommended for you and due dates:  Health Maintenance  Topic Date Due  . Flu Shot  11/24/2016*  . HIV Screening  03/05/2017*  . Mammogram  09/27/2017  . Pap Smear  12/26/2018  . Tetanus Vaccine  08/27/2021  . Colon Cancer Screening  10/25/2024  .  Hepatitis C: One time screening is recommended by Center for Disease Control  (CDC) for  adults born from [birth year] through 1966.   Completed  *Topic was postponed. The date shown is not the original due date.   Steps to Quit Smoking Smoking tobacco can be bad for your health. It can also affect almost every organ in your body. Smoking puts you and people around you at risk for many serious long-lasting (chronic) diseases. Quitting smoking is hard, but it is one of the best things that you can do for your health. It is never too late to quit. What are the benefits of quitting smoking? When you quit smoking, you lower your risk for getting serious diseases and conditions. They can include:  Lung cancer or lung disease.  Heart disease.  Stroke.  Heart attack.  Not being able to have children (infertility).  Weak bones (osteoporosis) and broken bones (fractures). If you have coughing, wheezing, and shortness of breath, those symptoms may get better when you quit. You may also get sick less often. If you are pregnant, quitting smoking can help to lower your chances of having a baby of low birth weight. What can I do to help me quit  smoking? Talk with your doctor about what can help you quit smoking. Some things you can do (strategies) include:  Quitting smoking totally, instead of slowly cutting back how much you smoke over a period of time.  Going to in-person counseling. You are more likely to quit if you go to many counseling sessions.  Using resources and support systems, such as:  Online chats with a Social worker.  Phone quitlines.  Printed Furniture conservator/restorer.  Support groups or group counseling.  Text messaging programs.  Mobile phone apps or applications.  Taking medicines. Some of these medicines may have nicotine in them. If you are pregnant or breastfeeding, do not take any medicines to quit smoking unless your doctor says it is okay. Talk with your doctor about counseling or other things that can help you. Talk with your doctor about using more than one strategy at the same time, such as taking medicines while you are also going to in-person counseling. This can help make quitting easier. What things can I do to make it easier to quit? Quitting smoking might feel very hard at first, but there is a lot that you can do to make it easier. Take these steps:  Talk to your family and friends. Ask them to support and encourage you.  Call phone quitlines, reach out to support groups, or work with  a Social worker.  Ask people who smoke to not smoke around you.  Avoid places that make you want (trigger) to smoke, such as:  Bars.  Parties.  Smoke-break areas at work.  Spend time with people who do not smoke.  Lower the stress in your life. Stress can make you want to smoke. Try these things to help your stress:  Getting regular exercise.  Deep-breathing exercises.  Yoga.  Meditating.  Doing a body scan. To do this, close your eyes, focus on one area of your body at a time from head to toe, and notice which parts of your body are tense. Try to relax the muscles in those areas.  Download or buy  apps on your mobile phone or tablet that can help you stick to your quit plan. There are many free apps, such as QuitGuide from the [employer] Office manager for Disease Control and Prevention). You can find more support from smokefree.gov and other websites. This information is not intended to replace advice given to you by your health care provider. Make sure you discuss any questions you have with your health care provider. Document Released: 06/09/2009 Document Revised: 04/10/2016 Document Reviewed: 12/28/2014 Elsevier Interactive Patient Education  2017 [employer].

## 2016-09-05 NOTE — Progress Notes (Addendum)
Subjective:   Susan Cortez is a 56 y.o. female who presents for an Initial Medicare Annual Wellness Visit.  Review of Systems    No ROS.  Medicare Wellness Visit.  Cardiac Risk Factors include: smoking/ tobacco exposure  Sleep patterns: is not rested upon awakening, gets up 1-2 times nightly to void and 7-8 hours nightly.   Home Safety/Smoke Alarms: Feels safe in home. Smoke alarms in place.   Living environment; residence and Firearm Safety: Lives alone. 1-story house/ trailer, tub-shower, no firearms. Seat Belt Safety/Bike Helmet: Wears seat belt.   Counseling:   Eye Exam- Follows w/ Dr. Jola Schmidt in [place]. Has upcoming appts. No issues reported. Dental- Does not have dentist. Does have periodontal disease.   Female:   Pap- last 12/26/15. NEGATIVE FOR INTRAEPITHELIAL LESIONS OR MALIGNANCY.     Mammo- last 10/10/15. BI-RADS CATEGORY1: Negative.       Dexa scan- Not on file.         CCS- ? Last 11/2014 w/ [facility]. 3 year recall per pt.      Objective:    Today's Vitals   09/05/16 0937 09/05/16 0939  BP: 130/78   Pulse: 88   Resp: 18   SpO2: 98%   Weight: 221 lb 9.6 oz (100.5 kg)   Height: '5\' 9"'$  (1.753 m)   PainSc:  7    Body mass index is 32.72 kg/m.   Current Medications (verified) Outpatient Encounter Prescriptions as of 09/05/2016  Medication Sig  . acetaminophen-codeine (TYLENOL #4) 300-60 MG tablet Take 1 tablet by mouth daily.  . polyethylene glycol powder (GLYCOLAX/MIRALAX) powder Take 17 g by mouth 2 (two) times daily as needed.  . [DISCONTINUED] azelastine (ASTELIN) 0.1 % nasal spray Place 2 sprays into both nostrils 2 (two) times daily. Use in each nostril as directed (Patient not taking: Reported on 09/05/2016)  . [DISCONTINUED] levocetirizine (XYZAL) 5 MG tablet Take 1 tablet (5 mg total) by mouth every evening. (Patient not taking: Reported on 09/05/2016)  . [DISCONTINUED] tobramycin (TOBREX) 0.3 % ophthalmic solution Place 2 drops  into the right eye every 6 (six) hours. (Patient not taking: Reported on 09/05/2016)   No facility-administered encounter medications on file as of 09/05/2016.     Allergies (verified) Chantix [varenicline]; Diclofenac; Meloxicam; Butrans [buprenorphine]; Lyrica [pregabalin]; Nsaids; and Tolmetin   History: Past Medical History:  Diagnosis Date  . Allergy   . Arthritis    Past Surgical History:  Procedure Laterality Date  . CESAREAN SECTION    . SHOULDER ARTHROSCOPY WITH OPEN ROTATOR CUFF REPAIR  05/2009  . TONSILLECTOMY  1977  . TUBAL LIGATION     Family History  Problem Relation Age of Onset  . Diabetes Father   . Heart disease Father   . Hyperlipidemia Father    Social History   Occupational History  . Not on file.   Social History Main Topics  . Smoking status: Current Every Day Smoker    Packs/day: 1.00    Years: 40.00  . Smokeless tobacco: Never Used  . Alcohol use No  . Drug use: No  . Sexual activity: No    Tobacco Counseling Ready to quit: Not Answered Counseling given: Not Answered   Activities of Daily Living In your present state of health, do you have any difficulty performing the following activities: 09/05/2016 12/26/2015  Hearing? N N  Vision? N Y  Difficulty concentrating or making decisions? N Y  Walking or climbing stairs? N N  Dressing or  bathing? N N  Doing errands, shopping? N N  Preparing Food and eating ? N -  Using the Toilet? N -  In the past six months, have you accidently leaked urine? N -  Do you have problems with loss of bowel control? N -  Managing your Medications? N -  Managing your Finances? N -  Housekeeping or managing your Housekeeping? N -  Some recent data might be hidden    Immunizations and Health Maintenance Immunization History  Administered Date(s) Administered  . Tdap 08/28/2011   There are no preventive care reminders to display for this patient.  Patient Care Team: Darreld Mclean, MD as PCP -  General (Family Medicine) Charlett Blake, MD as Consulting Physician (Pain Medicine) Jola Schmidt, MD as Consulting Physician (Ophthalmology)  Indicate any recent Medical Services you may have received from other than [facility] providers in the past year (date may be approximate).     Assessment:   This is a routine wellness examination for Susan Cortez. Physical assessment deferred to PCP.  Hearing/Vision screen Hearing Screening Comments: Able to hear conversational tones w/o difficulty. No issues reported. Passes whisper test. Vision Screening Comments: Last eye exam 3 years ago. Vision corrected w/ glasses. Next appt w/ Dr. Valetta Close around March for vision exam/glasses rx.   Dietary issues and exercise activities discussed: Current Exercise Habits: Home exercise routine, Type of exercise: walking, Time (Minutes): 15, Frequency (Times/Week): 5, Weekly Exercise (Minutes/Week): 75, Intensity: Mild   Diet (meal preparation, eat out, water intake, caffeinated beverages, dairy products, fruits and vegetables): in general, an "unhealthy" diet, on average, 1-2 meals per day. Drinks 2 large bottles of water daily. Drinks some coffee. Cooks own meals.  Does not like fruits, but does like cooked vegetables, although does not eat as many as she would like to.    Goals    . Quit smoking / using tobacco    . Weight (lb) < 200 lb (90.7 kg)      Depression Screen PHQ 2/9 Scores 09/05/2016 04/03/2016 12/26/2015 05/05/2015 11/22/2014  PHQ - 2 Score 0 3 0 3 3  PHQ- 9 Score - - - - 9  Exception Documentation - - Patient refusal - -    Fall Risk Fall Risk  09/05/2016 07/03/2016 04/03/2016 01/02/2016 01/02/2016  Falls in the past year? No No No (No Data) No  Number falls in past yr: - - - - -  Injury with Fall? - - - - -  Risk for fall due to : - - - - -  Follow up - - - - -    Cognitive Function: MMSE - Mini Mental State Exam 09/05/2016  Orientation to time 5  Orientation to Place 5  Registration 3  Attention/  Calculation 5  Recall 3  Language- name 2 objects 2  Language- repeat 1  Language- follow 3 step command 3  Language- read & follow direction 1  Write a sentence 1  Copy design 1  Total score 30        Screening Tests Health Maintenance  Topic Date Due  . INFLUENZA VACCINE  11/24/2016 (Originally 03/27/2016)  . HIV Screening  03/05/2017 (Originally 10/10/1974)  . MAMMOGRAM  09/27/2017  . PAP SMEAR  12/26/2018  . TETANUS/TDAP  08/27/2021  . COLONOSCOPY  10/25/2024  . Hepatitis C Screening  Completed      Plan:   Follow-up w/ PCP as scheduled. Bring a copy of your advance directives to your next office visit.  Diet-reduce portion size. Eat regular meals. Increase vegetable intake.   Smoking cessation- Pt ready to quit. Would like to discuss medication to aid in this goal. Has not tolerated Chantix in the past. Appt scheduled w/ PCP tomorrow.  During the course of the visit, Susan Cortez was educated and counseled about the following appropriate screening and preventive services:   Vaccines to include Pneumoccal, Influenza, Hepatitis B, Td, Zostavax, HCV  Cardiovascular disease screening  Colorectal cancer screening  Diabetes screening  Glaucoma screening  Mammography/PAP  Nutrition counseling  Smoking cessation counseling  Patient Instructions (the written plan) were given to the patient.    Dorrene German, RN   09/05/2016    Have reviewed above and agree J Copland MD 09/05/16

## 2016-09-05 NOTE — Progress Notes (Signed)
Pre visit review using our clinic review tool, if applicable. No additional management support is needed unless otherwise documented below in the visit note. 

## 2016-09-06 ENCOUNTER — Ambulatory Visit: Payer: PPO | Admitting: Family Medicine

## 2016-09-06 NOTE — Progress Notes (Deleted)
[place] at [facility] [address], [place], [place] [address] [phone]  Date:  09/06/2016   Name:  Susan Cortez   DOB:  12/13/1960   MRN:  [id number]  PCP:  Lamar Blinks, MD    Chief Complaint: No chief complaint on file.   History of Present Illness:  Susan Cortez is a 56 y.o. very pleasant female patient who presents with the following:  I last saw this pt in May of 2017- partial HPI as follows;  Establishing care with [place] as PCP today. History of cervical spinal stenosis, chronic painis managed by Dr. Letta Pate with tylenol #3.  She is originally from [place].  She is disabled She has 3 children and 1 grandson who lives in [place]- this is why she moved to this area.   She last saw Dr. Letta Pate in November, had her medicare wellness exam yesterday with Leandra Kern, RN.  Here today with concern of wishing to quit smoking.  She talked about this yesterday with Hoyle Sauer  Patient Active Problem List   Diagnosis Date Noted  . Sebaceous hyperplasia of face 06/26/2016  . Chronic right shoulder pain 01/02/2016  . Adenoma of left adrenal gland 12/26/2015  . Intermittent claudication ([place]) 12/26/2015  . Rotator cuff (capsule) sprain 03/26/2014  . Spinal stenosis in cervical region 03/26/2014  . Chronic pain 03/26/2014    Past Medical History:  Diagnosis Date  . Allergy   . Arthritis     Past Surgical History:  Procedure Laterality Date  . CESAREAN SECTION    . SHOULDER ARTHROSCOPY WITH OPEN ROTATOR CUFF REPAIR  05/2009  . TONSILLECTOMY  1977  . TUBAL LIGATION      Social History  Substance Use Topics  . Smoking status: Current Every Day Smoker    Packs/day: 1.00    Years: 40.00  . Smokeless tobacco: Never Used  . Alcohol use No    Family History  Problem Relation Age of Onset  . Diabetes Father   . Heart disease Father   . Hyperlipidemia Father     Allergies  Allergen Reactions  . Chantix  [Varenicline] Other (See Comments)    Strange dreams/hallucinations  . Diclofenac Swelling  . Meloxicam Swelling  . Butrans [Buprenorphine] Itching    Itching and redness at the area of the patch.  Recardo Evangelist [Pregabalin]     Headache,severe eye pain, swelling  . Nsaids     Stomach pain  . Tolmetin Other (See Comments)    Stomach pain    Medication list has been reviewed and updated.  Current Outpatient Prescriptions on File Prior to Visit  Medication Sig Dispense Refill  . acetaminophen-codeine (TYLENOL #4) 300-60 MG tablet Take 1 tablet by mouth daily. 30 tablet 2  . polyethylene glycol powder (GLYCOLAX/MIRALAX) powder Take 17 g by mouth 2 (two) times daily as needed. 3350 g 6   No current facility-administered medications on file prior to visit.     Review of Systems:  ***  Physical Examination: There were no vitals filed for this visit. There were no vitals filed for this visit. There is no height or weight on file to calculate BMI. Ideal Body Weight:    ***  Assessment and Plan: ***  Signed Lamar Blinks, MD

## 2016-09-10 ENCOUNTER — Encounter: Payer: Self-pay | Admitting: Family Medicine

## 2016-09-13 ENCOUNTER — Ambulatory Visit: Payer: PPO | Admitting: Family Medicine

## 2016-09-17 ENCOUNTER — Encounter: Payer: Self-pay | Admitting: Family Medicine

## 2016-09-17 ENCOUNTER — Ambulatory Visit (INDEPENDENT_AMBULATORY_CARE_PROVIDER_SITE_OTHER): Payer: PPO | Admitting: Family Medicine

## 2016-09-17 VITALS — BP 113/81 | HR 83 | Temp 98.0°F | Ht 69.0 in | Wt 222.2 lb

## 2016-09-17 DIAGNOSIS — E669 Obesity, unspecified: Secondary | ICD-10-CM | POA: Diagnosis not present

## 2016-09-17 DIAGNOSIS — F17200 Nicotine dependence, unspecified, uncomplicated: Secondary | ICD-10-CM | POA: Diagnosis not present

## 2016-09-17 MED ORDER — BUPROPION HCL ER (SR) 150 MG PO TB12: 150.0000 mg | ORAL_TABLET | Freq: Two times a day (BID) | ORAL | 3 refills | Status: DC

## 2016-09-17 MED ORDER — PHENTERMINE HCL 15 MG PO CAPS: 15.0000 mg | ORAL_CAPSULE | ORAL | 0 refills | Status: DC

## 2016-09-17 NOTE — Progress Notes (Signed)
Pre visit review using our clinic review tool, if applicable. No additional management support is needed unless otherwise documented below in the visit note. 

## 2016-09-17 NOTE — Patient Instructions (Signed)
It was great to see you today!  Please start on the wellbutrin- once a day for 3 days, then increase to twice a day. You will take this for up to 12 weeks for smoking cessation  After a week of the wellbutrin you can also add the phentermine once a day  Please see me in one month for a blood pressure check Let me know if any problems or concerns in the meantime

## 2016-09-17 NOTE — Progress Notes (Signed)
[place] at [facility] [address] [place], [place] [address] [phone]  Date:  09/17/2016   Name:  Susan Cortez   DOB:  Jan 15, 1961   MRN:  [id number]  PCP:  Lamar Blinks, MD    Chief Complaint: Medicaiton (Pt would like to discuss Wellbutrin and phentermine. )   History of Present Illness:  Susan Cortez is a 56 y.o. very pleasant female patient who presents with the following:  History of spinal stenosis.   Managed with tylenol #3, Dr. Letta Pate.   Last seen by myself for a CPE in May of last year  Here today to discuss quitting smoking She did try chantix but it did not work.  She has also used patches. She would like to try wellbutrin- she has not used this med in the past Currently she is smoking about 1 ppd- she has been at this level for 40- 45 years.    Otherwise she is doing well  She did use phentermine a few years ago for weight loss She feels like it helped to "jumpt start" weight loss for her. She did tolerate this well.  She did lose some weight with this med- maybe 10- 12 lbs.   She would like to get under 200 lbs if she can.    She has never had a seizure in the past.    BP Readings from Last 3 Encounters:  09/17/16 113/81  09/05/16 130/78  07/03/16 122/66    Wt Readings from Last 3 Encounters:  09/17/16 222 lb 3.2 oz (100.8 kg)  09/05/16 221 lb 9.6 oz (100.5 kg)  04/27/16 221 lb 9.6 oz (100.5 kg)     Patient Active Problem List   Diagnosis Date Noted  . Sebaceous hyperplasia of face 06/26/2016  . Chronic right shoulder pain 01/02/2016  . Adenoma of left adrenal gland 12/26/2015  . Intermittent claudication ([place]) 12/26/2015  . Rotator cuff (capsule) sprain 03/26/2014  . Spinal stenosis in cervical region 03/26/2014  . Chronic pain 03/26/2014    Past Medical History:  Diagnosis Date  . Allergy   . Arthritis     Past Surgical History:  Procedure Laterality Date  . CESAREAN SECTION     . SHOULDER ARTHROSCOPY WITH OPEN ROTATOR CUFF REPAIR  05/2009  . TONSILLECTOMY  1977  . TUBAL LIGATION      Social History  Substance Use Topics  . Smoking status: Current Every Day Smoker    Packs/day: 1.00    Years: 40.00  . Smokeless tobacco: Never Used  . Alcohol use No    Family History  Problem Relation Age of Onset  . Diabetes Father   . Heart disease Father   . Hyperlipidemia Father     Allergies  Allergen Reactions  . Chantix [Varenicline] Other (See Comments)    Strange dreams/hallucinations  . Diclofenac Swelling  . Meloxicam Swelling  . Butrans [Buprenorphine] Itching    Itching and redness at the area of the patch.  Recardo Evangelist [Pregabalin]     Headache,severe eye pain, swelling  . Nsaids     Stomach pain  . Tolmetin Other (See Comments)    Stomach pain    Medication list has been reviewed and updated.  Current Outpatient Prescriptions on File Prior to Visit  Medication Sig Dispense Refill  . acetaminophen-codeine (TYLENOL #4) 300-60 MG tablet Take 1 tablet by mouth daily. 30 tablet 2  . polyethylene glycol powder (GLYCOLAX/MIRALAX) powder Take 17 g  by mouth 2 (two) times daily as needed. 3350 g 6   No current facility-administered medications on file prior to visit.     Review of Systems:  As per HPI- otherwise negative.   Physical Examination: Vitals:   09/17/16 1244  BP: 113/81  Pulse: 83  Temp: 98 F (36.7 C)   Vitals:   09/17/16 1244  Weight: 222 lb 3.2 oz (100.8 kg)  Height: '5\' 9"'$  (1.753 m)   Body mass index is 32.81 kg/m. Ideal Body Weight: Weight in (lb) to have BMI = 25: 168.9  GEN: WDWN, NAD, Non-toxic, A & O x 3, smells of tobacco, looks well, mild obesity  HEENT: Atraumatic, Normocephalic. Neck supple. No masses, No LAD.  Bilateral TM wnl, oropharynx normal.  PEERL,EOMI.   Ears and Nose: No external deformity. CV: RRR, No M/G/R. No JVD. No thrill. No extra heart sounds. PULM: CTA B, no wheezes, crackles, rhonchi. No  retractions. No resp. distress. No accessory muscle use. EXTR: No c/c/e NEURO Normal gait.  PSYCH: Normally interactive. Conversant. Not depressed or anxious appearing.  Calm demeanor.    Assessment and Plan: Tobacco use disorder - Plan: buPROPion (WELLBUTRIN SR) 150 MG 12 hr tablet  Obesity, unspecified classification, unspecified obesity type, unspecified whether serious comorbidity present - Plan: phentermine 15 MG capsule  Here today to discuss wellbutrin for smoking cessation and phentermine for weight loss-  Plan as below  It was great to see you today!  Please start on the wellbutrin- once a day for 3 days, then increase to twice a day. You will take this for up to 12 weeks for smoking cessation  After a week of the wellbutrin you can also add the phentermine once a day  Please see me in one month for a blood pressure check Let me know if any problems or concerns in the meantime   Signed Lamar Blinks, MD

## 2016-09-26 ENCOUNTER — Encounter: Payer: Self-pay | Admitting: Family Medicine

## 2016-10-03 ENCOUNTER — Telehealth: Payer: Self-pay | Admitting: Registered Nurse

## 2016-10-03 ENCOUNTER — Encounter: Payer: Self-pay | Admitting: Registered Nurse

## 2016-10-03 ENCOUNTER — Encounter: Payer: PPO | Attending: Physical Medicine & Rehabilitation | Admitting: Registered Nurse

## 2016-10-03 VITALS — BP 125/69 | HR 106 | Resp 14

## 2016-10-03 DIAGNOSIS — Z5181 Encounter for therapeutic drug level monitoring: Secondary | ICD-10-CM | POA: Diagnosis not present

## 2016-10-03 DIAGNOSIS — M545 Low back pain: Secondary | ICD-10-CM | POA: Diagnosis not present

## 2016-10-03 DIAGNOSIS — G894 Chronic pain syndrome: Secondary | ICD-10-CM | POA: Insufficient documentation

## 2016-10-03 DIAGNOSIS — M25512 Pain in left shoulder: Secondary | ICD-10-CM | POA: Diagnosis not present

## 2016-10-03 DIAGNOSIS — Z79899 Other long term (current) drug therapy: Secondary | ICD-10-CM

## 2016-10-03 DIAGNOSIS — M4802 Spinal stenosis, cervical region: Secondary | ICD-10-CM | POA: Diagnosis not present

## 2016-10-03 DIAGNOSIS — G8929 Other chronic pain: Secondary | ICD-10-CM | POA: Insufficient documentation

## 2016-10-03 MED ORDER — ACETAMINOPHEN-CODEINE #4 300-60 MG PO TABS: 1.0000 | ORAL_TABLET | Freq: Every day | ORAL | 2 refills | Status: DC

## 2016-10-03 NOTE — Telephone Encounter (Signed)
On 10/03/2016 the [place] was reviewed no conflict was seen on the [place] with multiple prescribers. (Susan Cortez has a signed narcotic contract with our office. If there were any discrepancies this would have been reported to her physician.

## 2016-10-03 NOTE — Patient Instructions (Signed)
Shoulder Exercises Ask your health care provider which exercises are safe for you. Do exercises exactly as told by your health care provider and adjust them as directed. It is normal to feel mild stretching, pulling, tightness, or discomfort as you do these exercises, but you should stop right away if you feel sudden pain or your pain gets worse.Do not begin these exercises until told by your health care provider. RANGE OF MOTION EXERCISES  These exercises warm up your muscles and joints and improve the movement and flexibility of your shoulder. These exercises also help to relieve pain, numbness, and tingling. These exercises involve stretching your injured shoulder directly. Exercise A: Pendulum  1. Stand near a wall or a surface that you can hold onto for balance. 2. Bend at the waist and let your left / right arm hang straight down. Use your other arm to support you. Keep your back straight and do not lock your knees. 3. Relax your left / right arm and shoulder muscles, and move your hips and your trunk so your left / right arm swings freely. Your arm should swing because of the motion of your body, not because you are using your arm or shoulder muscles. 4. Keep moving your body so your arm swings in the following directions, as told by your health care provider:  Side to side.  Forward and backward.  In clockwise and counterclockwise circles. 5. Continue each motion for __________ seconds, or for as long as told by your health care provider. 6. Slowly return to the starting position. Repeat __________ times. Complete this exercise __________ times a day. Exercise B:Flexion, Standing  1. Stand and hold a broomstick, a cane, or a similar object. Place your hands a little more than shoulder-width apart on the object. Your left / right hand should be palm-up, and your other hand should be palm-down. 2. Keep your elbow straight and keep your shoulder muscles relaxed. Push the stick down with  your healthy arm to raise your left / right arm in front of your body, and then over your head until you feel a stretch in your shoulder.  Avoid shrugging your shoulder while you raise your arm. Keep your shoulder blade tucked down toward the middle of your back. 3. Hold for __________ seconds. 4. Slowly return to the starting position. Repeat __________ times. Complete this exercise __________ times a day. Exercise C: Abduction, Standing 1. Stand and hold a broomstick, a cane, or a similar object. Place your hands a little more than shoulder-width apart on the object. Your left / right hand should be palm-up, and your other hand should be palm-down. 2. While keeping your elbow straight and your shoulder muscles relaxed, push the stick across your body toward your left / right side. Raise your left / right arm to the side of your body and then over your head until you feel a stretch in your shoulder.  Do not raise your arm above shoulder height, unless your health care provider tells you to do that.  Avoid shrugging your shoulder while you raise your arm. Keep your shoulder blade tucked down toward the middle of your back. 3. Hold for __________ seconds. 4. Slowly return to the starting position. Repeat __________ times. Complete this exercise __________ times a day. Exercise D:Internal Rotation  1. Place your left / right hand behind your back, palm-up. 2. Use your other hand to dangle an exercise band, a towel, or a similar object over your shoulder. Grasp the band  with your left / right hand so you are holding onto both ends. 3. Gently pull up on the band until you feel a stretch in the front of your left / right shoulder.  Avoid shrugging your shoulder while you raise your arm. Keep your shoulder blade tucked down toward the middle of your back. 4. Hold for __________ seconds. 5. Release the stretch by letting go of the band and lowering your hands. Repeat __________ times. Complete this  exercise __________ times a day. STRETCHING EXERCISES  These exercises warm up your muscles and joints and improve the movement and flexibility of your shoulder. These exercises also help to relieve pain, numbness, and tingling. These exercises are done using your healthy shoulder to help stretch the muscles of your injured shoulder. Exercise E: Warehouse manager (External Rotation and Abduction)  1. Stand in a doorway with one of your feet slightly in front of the other. This is called a staggered stance. If you cannot reach your forearms to the door frame, stand facing a corner of a room. 2. Choose one of the following positions as told by your health care provider:  Place your hands and forearms on the door frame above your head.  Place your hands and forearms on the door frame at the height of your head.  Place your hands on the door frame at the height of your elbows. 3. Slowly move your weight onto your front foot until you feel a stretch across your chest and in the front of your shoulders. Keep your head and chest upright and keep your abdominal muscles tight. 4. Hold for __________ seconds. 5. To release the stretch, shift your weight to your back foot. Repeat __________ times. Complete this stretch __________ times a day. Exercise F:Extension, Standing 1. Stand and hold a broomstick, a cane, or a similar object behind your back.  Your hands should be a little wider than shoulder-width apart.  Your palms should face away from your back. 2. Keeping your elbows straight and keeping your shoulder muscles relaxed, move the stick away from your body until you feel a stretch in your shoulder.  Avoid shrugging your shoulders while you move the stick. Keep your shoulder blade tucked down toward the middle of your back. 3. Hold for __________ seconds. 4. Slowly return to the starting position. Repeat __________ times. Complete this exercise __________ times a day. STRENGTHENING EXERCISES    These exercises build strength and endurance in your shoulder. Endurance is the ability to use your muscles for a long time, even after they get tired. Exercise G:External Rotation  1. Sit in a stable chair without armrests. 2. Secure an exercise band at elbow height on your left / right side. 3. Place a soft object, such as a folded towel or a small pillow, between your left / right upper arm and your body to move your elbow a few inches away (about 10 cm) from your side. 4. Hold the end of the band so it is tight and there is no slack. 5. Keeping your elbow pressed against the soft object, move your left / right forearm out, away from your abdomen. Keep your body steady so only your forearm moves. 6. Hold for __________ seconds. 7. Slowly return to the starting position. Repeat __________ times. Complete this exercise __________ times a day. Exercise H:Shoulder Abduction  1. Sit in a stable chair without armrests, or stand. 2. Hold a __________ weight in your left / right hand, or hold an exercise  band with both hands. 3. Start with your arms straight down and your left / right palm facing in, toward your body. 4. Slowly lift your left / right hand out to your side. Do not lift your hand above shoulder height unless your health care provider tells you that this is safe.  Keep your arms straight.  Avoid shrugging your shoulder while you do this movement. Keep your shoulder blade tucked down toward the middle of your back. 5. Hold for __________ seconds. 6. Slowly lower your arm, and return to the starting position. Repeat __________ times. Complete this exercise __________ times a day. Exercise I:Shoulder Extension 1. Sit in a stable chair without armrests, or stand. 2. Secure an exercise band to a stable object in front of you where it is at shoulder height. 3. Hold one end of the exercise band in each hand. Your palms should face each other. 4. Straighten your elbows and lift your  hands up to shoulder height. 5. Step back, away from the secured end of the exercise band, until the band is tight and there is no slack. 6. Squeeze your shoulder blades together as you pull your hands down to the sides of your thighs. Stop when your hands are straight down by your sides. Do not let your hands go behind your body. 7. Hold for __________ seconds. 8. Slowly return to the starting position. Repeat __________ times. Complete this exercise __________ times a day. Exercise J:Standing Shoulder Row 1. Sit in a stable chair without armrests, or stand. 2. Secure an exercise band to a stable object in front of you so it is at waist height. 3. Hold one end of the exercise band in each hand. Your palms should be in a thumbs-up position. 4. Bend each of your elbows to an "L" shape (about 90 degrees) and keep your upper arms at your sides. 5. Step back until the band is tight and there is no slack. 6. Slowly pull your elbows back behind you. 7. Hold for __________ seconds. 8. Slowly return to the starting position. Repeat __________ times. Complete this exercise __________ times a day. Exercise K:Shoulder Press-Ups  1. Sit in a stable chair that has armrests. Sit upright, with your feet flat on the floor. 2. Put your hands on the armrests so your elbows are bent and your fingers are pointing forward. Your hands should be about even with the sides of your body. 3. Push down on the armrests and use your arms to lift yourself off of the chair. Straighten your elbows and lift yourself up as much as you comfortably can.  Move your shoulder blades down, and avoid letting your shoulders move up toward your ears.  Keep your feet on the ground. As you get stronger, your feet should support less of your body weight as you lift yourself up. 4. Hold for __________ seconds. 5. Slowly lower yourself back into the chair. Repeat __________ times. Complete this exercise __________ times a day. Exercise  L: Wall Push-Ups  1. Stand so you are facing a stable wall. Your feet should be about one arm-length away from the wall. 2. Lean forward and place your palms on the wall at shoulder height. 3. Keep your feet flat on the floor as you bend your elbows and lean forward toward the wall. 4. Hold for __________ seconds. 5. Straighten your elbows to push yourself back to the starting position. Repeat __________ times. Complete this exercise __________ times a day. This information is not  intended to replace advice given to you by your health care provider. Make sure you discuss any questions you have with your health care provider. Document Released: 06/27/2005 Document Revised: 05/07/2016 Document Reviewed: 04/24/2015 Elsevier Interactive Patient Education  2017 [employer] Shoulder Range of Motion Exercises Shoulder range of motion (ROM) exercises are designed to keep the shoulder moving freely. They are often recommended for people who have shoulder pain. Phase 1 exercises When you are able, do this exercise 5-6 days per week, or as told by your health care provider. Work toward doing 2 sets of 10 swings. Pendulum Exercise  How To Do This Exercise Lying Down 1. Lie face-down on a bed with your abdomen close to the side of the bed. 2. Let your arm hang over the side of the bed. 3. Relax your shoulder, arm, and hand. 4. Slowly and gently swing your arm forward and back. Do not use your neck muscles to swing your arm. They should be relaxed. If you are struggling to swing your arm, have someone gently swing it for you. When you do this exercise for the first time, swing your arm at a 15 degree angle for 15 seconds, or swing your arm 10 times. As pain lessens over time, increase the angle of the swing to 30-45 degrees. 5. Repeat steps 1-4 with the other arm. How To Do This Exercise While Standing 1. Stand next to a sturdy chair or table and hold on to it with your hand. 1. Bend forward at the  waist. 2. Bend your knees slightly. 3. Relax your other arm and let it hang limp. 4. Relax the shoulder blade of the arm that is hanging and let it drop. 5. While keeping your shoulder relaxed, use body motion to swing your arm in small circles. The first time you do this exercise, swing your arm for about 30 seconds or 10 times. When you do it next time, swing your arm for a little longer. 6. Stand up tall and relax. 7. Repeat steps 1-7, this time changing the direction of the circles. 2. Repeat steps 1-8 with the other arm. Phase 2 exercises Do these exercises 3-4 times per day on 5-6 days per week or as told by your health care provider. Work toward holding the stretch for 20 seconds. Stretching Exercise 1 1. Lift your arm straight out in front of you. 2. Bend your arm 90 degrees at the elbow (right angle) so your forearm goes across your body and looks like the letter "L." 3. Use your other arm to gently pull the elbow forward and across your body. 4. Repeat steps 1-3 with the other arm. Stretching Exercise 2  You will need a towel or rope for this exercise. 1. Bend one arm behind your back with the palm facing outward. 2. Hold a towel with your other hand. 3. Reach the arm that holds the towel above your head, and bend that arm at the elbow. Your wrist should be behind your neck. 4. Use your free hand to grab the free end of the towel. 5. With the higher hand, gently pull the towel up behind you. 6. With the lower hand, pull the towel down behind you. 7. Repeat steps 1-6 with the other arm. Phase 3 exercises Do each of these exercises at four different times of day (sessions) every day or as told by your health care provider. To begin with, repeat each exercise 5 times (repetitions). Work toward doing 3 sets of 12  repetitions or as told by your health care provider. Strengthening Exercise 1  You will need a light weight for this activity. As you grow stronger, you may use a heavier  weight. 1. Standing with a weight in your hand, lift your arm straight out to the side until it is at the same height as your shoulder. 2. Bend your arm at 90 degrees so that your fingers are pointing to the ceiling. 3. Slowly raise your hand until your arm is straight up in the air. 4. Repeat steps 1-3 with the other arm. Strengthening Exercise 2  You will need a light weight for this activity. As you grow stronger, you may use a heavier weight. 1. Standing with a weight in your hand, gradually move your straight arm in an arc, starting at your side, then out in front of you, then straight up over your head. 2. Gradually move your other arm in an arc, starting at your side, then out in front of you, then straight up over your head. 3. Repeat steps 1-2 with the other arm. Strengthening Exercise 3  You will need an elastic band for this activity. As you grow stronger, gradually increase the size of the bands or increase the number of bands that you use at one time. 8. While standing, hold an elastic band in one hand and raise that arm up in the air. 9. With your other hand, pull down the band until that hand is by your side. 10. Repeat steps 1-2 with the other arm. This information is not intended to replace advice given to you by your health care provider. Make sure you discuss any questions you have with your health care provider. Document Released: 05/12/2003 Document Revised: 04/08/2016 Document Reviewed: 08/09/2014 Elsevier Interactive Patient Education  2017 [employer].

## 2016-10-03 NOTE — Progress Notes (Signed)
Subjective:    Patient ID: Susan Cortez, female    DOB: 12-21-1960, 56 y.o.   MRN: [id number]  HPI:Susan Cortez is a 56year old female who returns for follow up appointment for chronic pain and medication refill. She states her pain is located in her neck, bilateral shoulders and occassionally left hand pain. Also states her pain has increased in frequency for the last two months, she's not asking for an increase in her analgesics. Will continue with current dose, instructed to call office with any questions and concerns. She verbalizes understanding. She rates her pain 7. Her current exercise regime is walking.  Pain Inventory Average Pain 7 Pain Right Now 7 My pain is sharp, burning, stabbing and aching  In the last 24 hours, has pain interfered with the following? General activity 5 Relation with others 3 Enjoyment of life 3 What TIME of day is your pain at its worst? daytime Sleep (in general) Poor  Pain is worse with: walking, bending, sitting, standing and some activites Pain improves with: rest, heat/ice, pacing activities and medication Relief from Meds: 8  Mobility walk without assistance how many minutes can you walk? 30 ability to climb steps?  yes do you drive?  yes Do you have any goals in this area?  no  Function disabled: date disabled n/a Do you have any goals in this area?  no  Neuro/Psych weakness numbness tingling  Prior Studies Any changes since last visit?  no  Physicians involved in your care Any changes since last visit?  no   Family History  Problem Relation Age of Onset  . Diabetes Father   . Heart disease Father   . Hyperlipidemia Father    Social History   Social History  . Marital status: Divorced    Spouse name: N/A  . Number of children: N/A  . Years of education: N/A   Social History Main Topics  . Smoking status: Current Every Day Smoker    Packs/day: 1.00    Years: 40.00  . Smokeless tobacco: Never Used  .  Alcohol use No  . Drug use: No  . Sexual activity: No   Other Topics Concern  . None   Social History Narrative  . None   Past Surgical History:  Procedure Laterality Date  . CESAREAN SECTION    . SHOULDER ARTHROSCOPY WITH OPEN ROTATOR CUFF REPAIR  05/2009  . TONSILLECTOMY  1977  . TUBAL LIGATION     Past Medical History:  Diagnosis Date  . Allergy   . Arthritis    BP 125/69   Pulse (!) 106   Resp 14   SpO2 98%   Opioid Risk Score:   Fall Risk Score:  `1  Depression screen PHQ 2/9  Depression screen [facility] 2/9 09/05/2016 04/03/2016 12/26/2015 05/05/2015 11/22/2014  Decreased Interest 0 3 0 3 3  Down, Depressed, Hopeless 0 0 0 0 0  PHQ - 2 Score 0 3 0 3 3  Altered sleeping - - - - 1  Tired, decreased energy - - - - 3  Change in appetite - - - - 0  Feeling bad or failure about yourself  - - - - 0  Trouble concentrating - - - - 2  Moving slowly or fidgety/restless - - - - 0  Suicidal thoughts - - - - 0  PHQ-9 Score - - - - 9      Review of Systems  Constitutional: Negative.   HENT: Negative.   Eyes:  Negative.   Respiratory: Negative.   Cardiovascular: Negative.   Gastrointestinal: Positive for constipation.  Endocrine: Negative.   Genitourinary: Negative.   Musculoskeletal: Negative.   Skin: Negative.   Allergic/Immunologic: Negative.   Neurological: Negative.   Hematological: Negative.   Psychiatric/Behavioral: Negative.   All other systems reviewed and are negative.      Objective:   Physical Exam  Constitutional: She is oriented to person, place, and time. She appears well-developed and well-nourished.  HENT:  Head: Normocephalic and atraumatic.  Neck: Normal range of motion. Neck supple.  Cervical Paraspinal Tenderness: C-5-C-6  Cardiovascular: Normal rate and regular rhythm.   Pulmonary/Chest: Effort normal and breath sounds normal.  Musculoskeletal:  Normal Muscle Bulk and Muscle Testing Reveals: Upper Extremities: Full ROM and Muscle Strength  5/5 Thoracic Paraspinal Tenderness: T-1-T-3 T-7-T-9 T-11- T-12 Lumbar Paraspinal Tenderness: L-3-L-5 Lower Extremities: Full ROM and Muscle Strength 5/5 Arises from Table with ease Narrow Based Gait  Neurological: She is alert and oriented to person, place, and time.  Skin: Skin is warm and dry.  Psychiatric: She has a normal mood and affect.  Nursing note and vitals reviewed.         Assessment & Plan:  1.Cervical spinal stenosis without evidence of radiculopathy. Chronic neck pain: Refilled Tylenol #4 300/'60mg'$  daily HS.#30. 02/07/20182. Right shoulder rotator cuff tear, status post repair with chronic pain and contracture : Continue with exercise regime. 10/03/2016 3. Insomnia: No complaints today.  20 minutes of face to face patient care time was spent during this visit. All questions were encouraged and answered.  F/U 3 months

## 2016-10-03 NOTE — Addendum Note (Signed)
Addended by: Marland Mcalpine B on: 10/03/2016 04:03 PM   Modules accepted: Orders

## 2016-10-09 DIAGNOSIS — Z1231 Encounter for screening mammogram for malignant neoplasm of breast: Secondary | ICD-10-CM | POA: Diagnosis not present

## 2016-10-09 LAB — 6-ACETYLMORPHINE,TOXASSURE ADD
6-ACETYLMORPHINE: NEGATIVE
6-ACETYLMORPHINE: NOT DETECTED ng/mg{creat}

## 2016-10-09 LAB — TOXASSURE SELECT,+ANTIDEPR,UR

## 2016-10-12 NOTE — Progress Notes (Signed)
Urine drug screen for this encounter is consistent for prescribed medication 

## 2016-11-05 DIAGNOSIS — Z1321 Encounter for screening for nutritional disorder: Secondary | ICD-10-CM | POA: Diagnosis not present

## 2016-11-05 DIAGNOSIS — Z1389 Encounter for screening for other disorder: Secondary | ICD-10-CM | POA: Diagnosis not present

## 2016-12-14 DIAGNOSIS — D2311 Other benign neoplasm of skin of right eyelid, including canthus: Secondary | ICD-10-CM | POA: Diagnosis not present

## 2016-12-14 DIAGNOSIS — H5203 Hypermetropia, bilateral: Secondary | ICD-10-CM | POA: Diagnosis not present

## 2016-12-27 ENCOUNTER — Ambulatory Visit (INDEPENDENT_AMBULATORY_CARE_PROVIDER_SITE_OTHER): Payer: PPO | Admitting: Family Medicine

## 2016-12-27 VITALS — BP 122/72 | HR 88 | Temp 98.2°F | Ht 68.5 in | Wt 221.6 lb

## 2016-12-27 DIAGNOSIS — Z13220 Encounter for screening for lipoid disorders: Secondary | ICD-10-CM

## 2016-12-27 DIAGNOSIS — J301 Allergic rhinitis due to pollen: Secondary | ICD-10-CM | POA: Diagnosis not present

## 2016-12-27 DIAGNOSIS — Z720 Tobacco use: Secondary | ICD-10-CM

## 2016-12-27 DIAGNOSIS — Z1329 Encounter for screening for other suspected endocrine disorder: Secondary | ICD-10-CM | POA: Diagnosis not present

## 2016-12-27 DIAGNOSIS — Z131 Encounter for screening for diabetes mellitus: Secondary | ICD-10-CM

## 2016-12-27 DIAGNOSIS — R7303 Prediabetes: Secondary | ICD-10-CM | POA: Diagnosis not present

## 2016-12-27 DIAGNOSIS — E785 Hyperlipidemia, unspecified: Secondary | ICD-10-CM

## 2016-12-27 DIAGNOSIS — Z130 Encounter for screening for diseases of the blood and blood-forming organs and certain disorders involving the immune mechanism: Secondary | ICD-10-CM | POA: Diagnosis not present

## 2016-12-27 DIAGNOSIS — D72829 Elevated white blood cell count, unspecified: Secondary | ICD-10-CM | POA: Diagnosis not present

## 2016-12-27 DIAGNOSIS — Z0000 Encounter for general adult medical examination without abnormal findings: Secondary | ICD-10-CM

## 2016-12-27 LAB — COMPREHENSIVE METABOLIC PANEL
ALBUMIN: 4.6 g/dL (ref 3.5–5.2)
ALK PHOS: 53 U/L (ref 39–117)
ALT: 24 U/L (ref 0–35)
AST: 21 U/L (ref 0–37)
BUN: 6 mg/dL (ref 6–23)
CO2: 26 mEq/L (ref 19–32)
CREATININE: 0.68 mg/dL (ref 0.40–1.20)
Calcium: 9.7 mg/dL (ref 8.4–10.5)
Chloride: 105 mEq/L (ref 96–112)
GFR: 94.72 mL/min (ref >60.00)
GLUCOSE: 104 mg/dL — AB (ref 70–99)
POTASSIUM: 4.1 meq/L (ref 3.5–5.1)
SODIUM: 137 meq/L (ref 135–145)
TOTAL PROTEIN: 7.4 g/dL (ref 6.0–8.3)
Total Bilirubin: 0.4 mg/dL (ref 0.2–1.2)

## 2016-12-27 LAB — CBC
HCT: 44.9 % (ref 36.0–46.0)
Hemoglobin: 15 g/dL (ref 12.0–15.0)
MCHC: 33.3 g/dL (ref 30.0–36.0)
MCV: 93.5 fl (ref 78.0–100.0)
Platelets: 414 10*3/uL — ABNORMAL HIGH (ref 150.0–400.0)
RBC: 4.8 Mil/uL (ref 3.87–5.11)
RDW: 14 % (ref 11.5–15.5)
WBC: 11.6 10*3/uL — AB (ref 4.0–10.5)

## 2016-12-27 LAB — LIPID PANEL
CHOLESTEROL: 226 mg/dL — AB (ref 0–200)
HDL: 55.4 mg/dL (ref >39.00)
LDL Cholesterol: 143 mg/dL — ABNORMAL HIGH (ref 0–99)
NONHDL: 171.05
Total CHOL/HDL Ratio: 4
Triglycerides: 138 mg/dL (ref 0.0–149.0)
VLDL: 27.6 mg/dL (ref 0.0–40.0)

## 2016-12-27 LAB — TSH: TSH: 1.21 u[IU]/mL (ref 0.35–4.50)

## 2016-12-27 LAB — HEMOGLOBIN A1C: HEMOGLOBIN A1C: 5.9 % (ref 4.6–6.5)

## 2016-12-27 MED ORDER — MONTELUKAST SODIUM 10 MG PO TABS: 10.0000 mg | ORAL_TABLET | Freq: Every day | ORAL | 5 refills | Status: DC

## 2016-12-27 NOTE — Patient Instructions (Addendum)
Recently, a new recommendation has been made regarding screening for lung cancer using annual "low dose" CT scanning.  This service is recommended for people who are 46- 57 years old, who currently smoke or quit in the last 15 years, and who smoked at least a pack per day for 30 years or more.    Some patients who are at least 57 years old and who smoked a pack per day for 20 years, or were exposed to second hand smoke may also qualify for screening.    In [place] this service is available at [facility], [phone]. The exam costs about $300 but may be covered by insurance.    It was very nice to see you today, as always!  I will be in touch with your labs asap  Please schedule a 30 minute appt with me in the next 1-2 months and we will remove the lesion from your right jaw  Try adding singulair to your allergy regimen- one a day as needed.  This can be combined with your OTC zyrtec or claritin

## 2016-12-27 NOTE — Progress Notes (Addendum)
[place] at [facility] [address], [place], [place] [address] [phone]  Date:  12/27/2016   Name:  Susan Cortez   DOB:  03/05/1961   MRN:  [id number]  PCP:  Lamar Blinks, MD    Chief Complaint: Annual Exam (Pt here for CPE and is fasting for labs. )   History of Present Illness:  Susan Cortez is a 56 y.o. very pleasant female patient who presents with the following: Here today for a CPE  Last seen by myself in January, at which time we started wellbutrin for tobacco cessation and phentermine for weight loss  She developed sleep issues with Wellbutrin and stopped using it.  She could not afford phentermine either so has not been able to take it either.  Her weight is stable.  She has been occupied by some health issues in her family so smoking cessation/ weight loss are on the back burning for now  She is still smoking 1 PPD but continues to want to quit Negative pap last year- will not do today She is fasting today for labs  Last mamo this year - February She is on a 3 year plan for her colonoscopy she had polyps on her last scope; will be due in 2019  She took part in a research study at [facility] in March- they did a cardiac MRI which was normal, she brings a copy of this with her today. She occasionally will participate in medical studies as she hopes to help advance medical knowledge  She has noted a mole on her right jaw for the last 4-5 months; no history of skin cancer. She is a bit concerned about this mole  She is bothered by seasonal allergies which are not controlled with zyrtec/ claritin or with nasal sprays   Wt Readings from Last 3 Encounters:  12/27/16 221 lb 9.6 oz (100.5 kg)  09/17/16 222 lb 3.2 oz (100.8 kg)  09/05/16 221 lb 9.6 oz (100.5 kg)    Patient Active Problem List   Diagnosis Date Noted  . Sebaceous hyperplasia of face 06/26/2016  . Chronic right shoulder pain 01/02/2016  . Adenoma of left  adrenal gland 12/26/2015  . Intermittent claudication ([place]) 12/26/2015  . Rotator cuff (capsule) sprain 03/26/2014  . Spinal stenosis in cervical region 03/26/2014  . Chronic pain 03/26/2014    Past Medical History:  Diagnosis Date  . Allergy   . Arthritis     Past Surgical History:  Procedure Laterality Date  . CESAREAN SECTION    . SHOULDER ARTHROSCOPY WITH OPEN ROTATOR CUFF REPAIR  05/2009  . TONSILLECTOMY  1977  . TUBAL LIGATION      Social History  Substance Use Topics  . Smoking status: Current Every Day Smoker    Packs/day: 1.00    Years: 40.00  . Smokeless tobacco: Never Used  . Alcohol use No    Family History  Problem Relation Age of Onset  . Diabetes Father   . Heart disease Father   . Hyperlipidemia Father     Allergies  Allergen Reactions  . Chantix [Varenicline] Other (See Comments)    Strange dreams/hallucinations  . Diclofenac Swelling  . Meloxicam Swelling  . Butrans [Buprenorphine] Itching    Itching and redness at the area of the patch.  Recardo Evangelist [Pregabalin]     Headache,severe eye pain, swelling  . Nsaids     Stomach pain  . Tolmetin Other (See Comments)  Stomach pain    Medication list has been reviewed and updated.  Current Outpatient Prescriptions on File Prior to Visit  Medication Sig Dispense Refill  . acetaminophen-codeine (TYLENOL #4) 300-60 MG tablet Take 1 tablet by mouth daily. 30 tablet 2  . polyethylene glycol powder (GLYCOLAX/MIRALAX) powder Take 17 g by mouth 2 (two) times daily as needed. 3350 g 6   No current facility-administered medications on file prior to visit.     Review of Systems:  As per HPI- otherwise negative. No fever, chills, nausea, vomiting, diarrhea   Physical Examination: Vitals:   12/27/16 1038  BP: 122/72  Pulse: 88  Temp: 98.2 F (36.8 C)   Vitals:   12/27/16 1038  Weight: 221 lb 9.6 oz (100.5 kg)  Height: 5' 8.5" (1.74 m)   Body mass index is 33.2 kg/m. Ideal Body Weight:  Weight in (lb) to have BMI = 25: 166.5  GEN: WDWN, NAD, Non-toxic, A & O x 3, obese, looks well HEENT: Atraumatic, Normocephalic. Neck supple. No masses, No LAD. Bilateral TM wnl, oropharynx normal.  PEERL,EOMI.   Ears and Nose: No external deformity. CV: RRR, No M/G/R. No JVD. No thrill. No extra heart sounds. PULM: CTA B, no wheezes, crackles, rhonchi. No retractions. No resp. distress. No accessory muscle use. ABD: S, NT, ND EXTR: No c/c/e NEURO Normal gait.  PSYCH: Normally interactive. Conversant. Not depressed or anxious appearing.  Calm demeanor.  Right jaw- she has a small skin lesion, approx 4 mm in diameter which has been present for the last 4-5 months   Assessment and Plan: Physical exam  Screening for deficiency anemia - Plan: CBC  Screening for thyroid disorder - Plan: TSH  Screening for hyperlipidemia - Plan: Lipid panel  Screening for diabetes mellitus - Plan: Comprehensive metabolic panel, Hemoglobin A1c  Seasonal allergic rhinitis due to pollen - Plan: montelukast (SINGULAIR) 10 MG tablet  Tobacco abuse  Here today for a CPE Labs pending as above She has noted allergy sx- will try adding singulair to her regimen Will plan further follow- up pending labs. She will make an appt with me to remove mole from her face   Signed Lamar Blinks, MD  Received her labs, 5/6 Message to pt  Results for orders placed or performed in visit on 12/27/16  CBC  Result Value Ref Range   WBC 11.6 (H) 4.0 - 10.5 K/uL   RBC 4.80 3.87 - 5.11 Mil/uL   Platelets 414.0 (H) 150.0 - 400.0 K/uL   Hemoglobin 15.0 12.0 - 15.0 g/dL   HCT 44.9 36.0 - 46.0 %   MCV 93.5 78.0 - 100.0 fl   MCHC 33.3 30.0 - 36.0 g/dL   RDW 14.0 11.5 - 15.5 %  Comprehensive metabolic panel  Result Value Ref Range   Sodium 137 135 - 145 mEq/L   Potassium 4.1 3.5 - 5.1 mEq/L   Chloride 105 96 - 112 mEq/L   CO2 26 19 - 32 mEq/L   Glucose, Bld 104 (H) 70 - 99 mg/dL   BUN 6 6 - 23 mg/dL   Creatinine,  Ser 0.68 0.40 - 1.20 mg/dL   Total Bilirubin 0.4 0.2 - 1.2 mg/dL   Alkaline Phosphatase 53 39 - 117 U/L   AST 21 0 - 37 U/L   ALT 24 0 - 35 U/L   Total Protein 7.4 6.0 - 8.3 g/dL   Albumin 4.6 3.5 - 5.2 g/dL   Calcium 9.7 8.4 - 10.5 mg/dL   GFR 94.72 >  60.00 mL/min  Lipid panel  Result Value Ref Range   Cholesterol 226 (H) 0 - 200 mg/dL   Triglycerides 138.0 0.0 - 149.0 mg/dL   HDL 55.40 >39.00 mg/dL   VLDL 27.6 0.0 - 40.0 mg/dL   LDL Cholesterol 143 (H) 0 - 99 mg/dL   Total CHOL/HDL Ratio 4    NonHDL 171.05   TSH  Result Value Ref Range   TSH 1.21 0.35 - 4.50 uIU/mL  Hemoglobin A1c  Result Value Ref Range   Hgb A1c MFr Bld 5.9 4.6 - 6.5 %

## 2016-12-30 ENCOUNTER — Encounter: Payer: Self-pay | Admitting: Family Medicine

## 2016-12-30 MED ORDER — LOVASTATIN 20 MG PO TABS: 20.0000 mg | ORAL_TABLET | Freq: Every day | ORAL | 3 refills | Status: DC

## 2016-12-30 NOTE — Addendum Note (Signed)
Addended by: Lamar Blinks C on: 12/30/2016 08:42 PM   Modules accepted: Orders

## 2016-12-31 ENCOUNTER — Encounter: Payer: Self-pay | Admitting: Registered Nurse

## 2016-12-31 ENCOUNTER — Telehealth: Payer: Self-pay | Admitting: Registered Nurse

## 2016-12-31 ENCOUNTER — Encounter: Payer: PPO | Attending: Physical Medicine & Rehabilitation | Admitting: Registered Nurse

## 2016-12-31 VITALS — BP 118/69 | HR 83

## 2016-12-31 DIAGNOSIS — M545 Low back pain: Secondary | ICD-10-CM | POA: Diagnosis not present

## 2016-12-31 DIAGNOSIS — M4802 Spinal stenosis, cervical region: Secondary | ICD-10-CM

## 2016-12-31 DIAGNOSIS — Z79899 Other long term (current) drug therapy: Secondary | ICD-10-CM

## 2016-12-31 DIAGNOSIS — G8929 Other chronic pain: Secondary | ICD-10-CM

## 2016-12-31 DIAGNOSIS — G894 Chronic pain syndrome: Secondary | ICD-10-CM | POA: Diagnosis not present

## 2016-12-31 DIAGNOSIS — Z5181 Encounter for therapeutic drug level monitoring: Secondary | ICD-10-CM

## 2016-12-31 DIAGNOSIS — M25511 Pain in right shoulder: Secondary | ICD-10-CM | POA: Diagnosis not present

## 2016-12-31 MED ORDER — ACETAMINOPHEN-CODEINE #4 300-60 MG PO TABS: 1.0000 | ORAL_TABLET | Freq: Every day | ORAL | 2 refills | Status: DC

## 2016-12-31 NOTE — Telephone Encounter (Signed)
On 12/31/2016 the  [place] was reviewed no conflict was seen on the [place] with multiple prescribers. Susan Cortez has a signed narcotic contract with our office. If there were any discrepancies this would have been reported to her physician.

## 2016-12-31 NOTE — Progress Notes (Signed)
Subjective:    Patient ID: Susan Cortez, female    DOB: 11-30-1960, 56 y.o.   MRN: [id number]  HPI: [name] is a 56year old female who returns for follow up appointment for chronic pain and medication refill. She statesher pain is located in her neck,right shoulder, lower back and occassionally left hand pain ( with tingling).  She rates her pain 7. Her current exercise regime is walking and performing stretching exercises with bands.  Last UDS was performed on 10/03/2016, it was consistent. UDS ordered today.   Pain Inventory Average Pain 6 Pain Right Now 7 My pain is constant, sharp, burning, stabbing and aching  In the last 24 hours, has pain interfered with the following? General activity 5 Relation with others 5 Enjoyment of life 5 What TIME of day is your pain at its worst? daytime, evening Sleep (in general) Fair  Pain is worse with: walking, bending, standing and some activites Pain improves with: rest, heat/ice and medication Relief from Meds: 7  Mobility walk without assistance how many minutes can you walk? 30 ability to climb steps?  yes do you drive?  yes Do you have any goals in this area?  no  Function disabled: date disabled . Do you have any goals in this area?  no  Neuro/Psych No problems in this area  Prior Studies Any changes since last visit?  no  Physicians involved in your care Any changes since last visit?  no   Family History  Problem Relation Age of Onset  . Diabetes Father   . Heart disease Father   . Hyperlipidemia Father    Social History   Social History  . Marital status: Divorced    Spouse name: N/A  . Number of children: N/A  . Years of education: N/A   Social History Main Topics  . Smoking status: Current Every Day Smoker    Packs/day: 1.00    Years: 40.00  . Smokeless tobacco: Never Used  . Alcohol use No  . Drug use: No  . Sexual activity: No   Other Topics Concern  . None   Social History  Narrative  . None   Past Surgical History:  Procedure Laterality Date  . CESAREAN SECTION    . SHOULDER ARTHROSCOPY WITH OPEN ROTATOR CUFF REPAIR  05/2009  . TONSILLECTOMY  1977  . TUBAL LIGATION     Past Medical History:  Diagnosis Date  . Allergy   . Arthritis    BP 118/69   Pulse 83   SpO2 93%   Opioid Risk Score:   Fall Risk Score:  `1  Depression screen PHQ 2/9  Depression screen [facility] 2/9 09/05/2016 04/03/2016 12/26/2015 05/05/2015 11/22/2014  Decreased Interest 0 3 0 3 3  Down, Depressed, Hopeless 0 0 0 0 0  PHQ - 2 Score 0 3 0 3 3  Altered sleeping - - - - 1  Tired, decreased energy - - - - 3  Change in appetite - - - - 0  Feeling bad or failure about yourself  - - - - 0  Trouble concentrating - - - - 2  Moving slowly or fidgety/restless - - - - 0  Suicidal thoughts - - - - 0  PHQ-9 Score - - - - 9     Review of Systems  Constitutional: Positive for diaphoresis.  HENT: Negative.   Eyes: Negative.   Respiratory: Negative.   Cardiovascular: Negative.   Gastrointestinal: Positive for constipation.  Endocrine: Negative.  Genitourinary: Negative.   Musculoskeletal: Positive for back pain.  Skin: Negative.   Allergic/Immunologic: Negative.   Neurological: Negative.   Hematological: Negative.   Psychiatric/Behavioral: Negative.        Objective:   Physical Exam  Constitutional: She is oriented to person, place, and time. She appears well-developed and well-nourished.  HENT:  Head: Normocephalic and atraumatic.  Neck: Normal range of motion. Neck supple.  Cervical Paraspinal Tenderness: C-5-C-6  Cardiovascular: Normal rate and regular rhythm.   Pulmonary/Chest: Effort normal and breath sounds normal.  Musculoskeletal:  Normal Muscle Bulk and Muscle Testing Reveals: Upper Extremities: Full ROM and Muscle Strength 5/5 Thoracic Paraspinal Tenderness: T-1-T-3  ( Mainly Right Side Lumbar Paraspinal Tenderness: L-1-L-2 Right Greater Trochanter  Tenderness Lower Extremities: Full ROM and Muscle Strength 5/5 Arises from Table with ease Narrow Based Gait     Neurological: She is alert and oriented to person, place, and time.  Skin: Skin is warm and dry.  Psychiatric: She has a normal mood and affect.  Nursing note and vitals reviewed.         Assessment & Plan:  1.Cervical spinal stenosis without evidence of radiculopathy. Chronic neck pain: Refilled Tylenol #4 300/'60mg'$  daily HS.#30. 12/31/2016 2. Right shoulder rotator cuff tear, status post repair with chronic pain and contracture : Continue with exercise regime. 12/31/2016 3. Insomnia: No complaints today. 4. Chronic Bilateral Lower Back Pain without Sciatica: Continue HEP as tolerated. Continue to Monitor   15 minutes of face to face patient care time was spent during this visit. All questions were encouraged and answered.    F/U 3 months

## 2017-01-03 ENCOUNTER — Ambulatory Visit (INDEPENDENT_AMBULATORY_CARE_PROVIDER_SITE_OTHER): Payer: PPO | Admitting: Family Medicine

## 2017-01-03 ENCOUNTER — Telehealth: Payer: Self-pay | Admitting: *Deleted

## 2017-01-03 VITALS — BP 132/74 | HR 92 | Temp 98.2°F | Ht 68.5 in | Wt 222.4 lb

## 2017-01-03 DIAGNOSIS — C44319 Basal cell carcinoma of skin of other parts of face: Secondary | ICD-10-CM | POA: Diagnosis not present

## 2017-01-03 DIAGNOSIS — L989 Disorder of the skin and subcutaneous tissue, unspecified: Secondary | ICD-10-CM

## 2017-01-03 LAB — 6-ACETYLMORPHINE,TOXASSURE ADD
6-ACETYLMORPHINE: NEGATIVE
6-ACETYLMORPHINE: NOT DETECTED ng/mg{creat}

## 2017-01-03 LAB — TOXASSURE SELECT,+ANTIDEPR,UR

## 2017-01-03 NOTE — Progress Notes (Signed)
[place] at [facility] [address], [place], [place] [address] [phone]  Date:  01/03/2017   Name:  Susan Cortez   DOB:  [birth year]   MRN:  [id number]  PCP:  Darreld Mclean, MD    Chief Complaint: skin tag (Pt here to have skin tag removed. )   History of Present Illness:  Susan Cortez is a 56 y.o. very pleasant female patient who presents with the following:  Seen recently for a CPE- today she is following up to have a concerning mole removed from her right jaw line.  However she also now notes a tiny lesion superior to the main mole.    She is feeling well, no other concerns today  Patient Active Problem List   Diagnosis Date Noted  . Sebaceous hyperplasia of face 06/26/2016  . Chronic right shoulder pain 01/02/2016  . Adenoma of left adrenal gland 12/26/2015  . Intermittent claudication ([place]) 12/26/2015  . Rotator cuff (capsule) sprain 03/26/2014  . Spinal stenosis in cervical region 03/26/2014  . Chronic pain 03/26/2014    Past Medical History:  Diagnosis Date  . Allergy   . Arthritis     Past Surgical History:  Procedure Laterality Date  . CESAREAN SECTION    . SHOULDER ARTHROSCOPY WITH OPEN ROTATOR CUFF REPAIR  05/2009  . TONSILLECTOMY  1977  . TUBAL LIGATION      Social History  Substance Use Topics  . Smoking status: Current Every Day Smoker    Packs/day: 1.00    Years: 40.00  . Smokeless tobacco: Never Used  . Alcohol use No    Family History  Problem Relation Age of Onset  . Diabetes Father   . Heart disease Father   . Hyperlipidemia Father     Allergies  Allergen Reactions  . Chantix [Varenicline] Other (See Comments)    Strange dreams/hallucinations  . Diclofenac Swelling  . Meloxicam Swelling  . Butrans [Buprenorphine] Itching    Itching and redness at the area of the patch.  Recardo Evangelist [Pregabalin]     Headache,severe eye pain, swelling  . Nsaids     Stomach pain  .  Tolmetin Other (See Comments)    Stomach pain    Medication list has been reviewed and updated.  Current Outpatient Prescriptions on File Prior to Visit  Medication Sig Dispense Refill  . acetaminophen-codeine (TYLENOL #4) 300-60 MG tablet Take 1 tablet by mouth daily. 30 tablet 2  . lovastatin (MEVACOR) 20 MG tablet Take 1 tablet (20 mg total) by mouth at bedtime. 90 tablet 3  . montelukast (SINGULAIR) 10 MG tablet Take 1 tablet (10 mg total) by mouth at bedtime. 30 tablet 5  . polyethylene glycol powder (GLYCOLAX/MIRALAX) powder Take 17 g by mouth 2 (two) times daily as needed. 3350 g 6   No current facility-administered medications on file prior to visit.     Review of Systems:  As per HPI- otherwise negative.   Physical Examination: Vitals:   01/03/17 1119  BP: 132/74  Pulse: 92  Temp: 98.2 F (36.8 C)   Vitals:   01/03/17 1119  Weight: 222 lb 6.4 oz (100.9 kg)  Height: 5' 8.5" (1.74 m)   Body mass index is 33.32 kg/m. Ideal Body Weight: Weight in (lb) to have BMI = 25: 166.5   GEN: WDWN, NAD, Non-toxic, Alert & Oriented x 3 HEENT: Atraumatic, Normocephalic.  Ears and Nose: No external deformity. EXTR: No clubbing/cyanosis/edema  NEURO: Normal gait.  PSYCH: Normally interactive. Conversant. Not depressed or anxious appearing.  Calm demeanor.  She has 2 likely nevi on the right jaw line inferior to her ear.  The larger is approx 49m in diameter.  The smaller lesion is approx 2 mm in diameter  VC obtained.  Prepped lesions with betadine and alcohol pad. Anesthesia with 2 small wheals of 1% lido. Shaved both lesions with a 15 blade and sent to pathology. Dressed with band- aid.  Tolerated well  Assessment and Plan: Skin lesion - Plan: Dermatology pathology  Skin lesions shaved and sent to path as above.  Will be in touch with her pending her results  Asked her to contact me with any sign of infecion or other concerns Advised to leave wound clean and dry today-  tomorrow may shower as usual, cover as needed for comfort   Signed JLamar Blinks MD

## 2017-01-03 NOTE — Telephone Encounter (Signed)
Urine drug screen for this encounter is consistent for prescribed medication 

## 2017-01-07 ENCOUNTER — Telehealth: Payer: Self-pay | Admitting: Family Medicine

## 2017-01-07 DIAGNOSIS — C44319 Basal cell carcinoma of skin of other parts of face: Secondary | ICD-10-CM

## 2017-01-07 NOTE — Telephone Encounter (Signed)
Received her pathology results- consistent with basal cell carcinoma. Will refer to dermatology for an opinion- hopefully we got all the cancer but advised pt that further tissue sampling or resection may be required.  She states understanding

## 2017-01-15 DIAGNOSIS — L281 Prurigo nodularis: Secondary | ICD-10-CM | POA: Diagnosis not present

## 2017-01-15 DIAGNOSIS — D485 Neoplasm of uncertain behavior of skin: Secondary | ICD-10-CM | POA: Diagnosis not present

## 2017-01-15 DIAGNOSIS — C4441 Basal cell carcinoma of skin of scalp and neck: Secondary | ICD-10-CM | POA: Diagnosis not present

## 2017-01-22 DIAGNOSIS — L0100 Impetigo, unspecified: Secondary | ICD-10-CM | POA: Diagnosis not present

## 2017-01-28 ENCOUNTER — Other Ambulatory Visit (INDEPENDENT_AMBULATORY_CARE_PROVIDER_SITE_OTHER): Payer: PPO

## 2017-01-28 DIAGNOSIS — D72829 Elevated white blood cell count, unspecified: Secondary | ICD-10-CM

## 2017-01-28 LAB — CBC WITH DIFFERENTIAL/PLATELET
Basophils Absolute: 0.1 10*3/uL (ref 0.0–0.1)
Basophils Relative: 0.6 % (ref 0.0–3.0)
EOS PCT: 1.2 % (ref 0.0–5.0)
Eosinophils Absolute: 0.1 10*3/uL (ref 0.0–0.7)
HEMATOCRIT: 43.4 % (ref 36.0–46.0)
HEMOGLOBIN: 14.7 g/dL (ref 12.0–15.0)
LYMPHS PCT: 36.8 % (ref 12.0–46.0)
Lymphs Abs: 3.9 10*3/uL (ref 0.7–4.0)
MCHC: 33.8 g/dL (ref 30.0–36.0)
MCV: 93.5 fl (ref 78.0–100.0)
Monocytes Absolute: 0.7 10*3/uL (ref 0.1–1.0)
Monocytes Relative: 6.5 % (ref 3.0–12.0)
NEUTROS ABS: 5.9 10*3/uL (ref 1.4–7.7)
Neutrophils Relative %: 54.9 % (ref 43.0–77.0)
PLATELETS: 402 10*3/uL — AB (ref 150.0–400.0)
RBC: 4.64 Mil/uL (ref 3.87–5.11)
RDW: 13.5 % (ref 11.5–15.5)
WBC: 10.7 10*3/uL — ABNORMAL HIGH (ref 4.0–10.5)

## 2017-02-14 ENCOUNTER — Encounter: Payer: Self-pay | Admitting: Family

## 2017-02-14 ENCOUNTER — Ambulatory Visit (INDEPENDENT_AMBULATORY_CARE_PROVIDER_SITE_OTHER): Payer: PPO | Admitting: Family

## 2017-02-14 ENCOUNTER — Ambulatory Visit (HOSPITAL_BASED_OUTPATIENT_CLINIC_OR_DEPARTMENT_OTHER)
Admission: RE | Admit: 2017-02-14 | Discharge: 2017-02-14 | Disposition: A | Discharge status: Home / Self Care | Payer: PPO | Source: Ambulatory Visit | Attending: Family | Admitting: Family

## 2017-02-14 VITALS — BP 119/76 | HR 83 | Temp 97.7°F | Resp 18 | Ht 68.5 in | Wt 221.6 lb

## 2017-02-14 DIAGNOSIS — M79672 Pain in left foot: Secondary | ICD-10-CM

## 2017-02-14 DIAGNOSIS — M779 Enthesopathy, unspecified: Secondary | ICD-10-CM | POA: Insufficient documentation

## 2017-02-14 DIAGNOSIS — S99922A Unspecified injury of left foot, initial encounter: Secondary | ICD-10-CM | POA: Diagnosis not present

## 2017-02-14 NOTE — Progress Notes (Signed)
Subjective:    Patient ID: Susan Cortez, female    DOB: [birth year], 56 y.o.   MRN: [id number]  HPI  Susan Cortez is a 56 yr old female who presents today with chief complaint of left sided foot pain.  Pain is located on the left dorsal foot. Pain began 1 week ago after kicking a ball. The tip of her foot hit the floor and she stubbed the toes on her left foot. Reports that her pain radiates up her left leg from the foot. She is able to weight bear, but notes that pain is worse if she doesn't have a shoe on.     Review of Systems See HPI  Past Medical History:  Diagnosis Date  . Allergy   . Arthritis      Social History   Social History  . Marital status: Divorced    Spouse name: N/A  . Number of children: N/A  . Years of education: N/A   Occupational History  . Not on file.   Social History Main Topics  . Smoking status: Current Every Day Smoker    Packs/day: 1.00    Years: 40.00  . Smokeless tobacco: Never Used  . Alcohol use No  . Drug use: No  . Sexual activity: No   Other Topics Concern  . Not on file   Social History Narrative  . No narrative on file    Past Surgical History:  Procedure Laterality Date  . CESAREAN SECTION    . SHOULDER ARTHROSCOPY WITH OPEN ROTATOR CUFF REPAIR  05/2009  . TONSILLECTOMY  1977  . TUBAL LIGATION      Family History  Problem Relation Age of Onset  . Diabetes Father   . Heart disease Father   . Hyperlipidemia Father     Allergies  Allergen Reactions  . Chantix [Varenicline] Other (See Comments)    Strange dreams/hallucinations  . Diclofenac Swelling  . Meloxicam Swelling  . Butrans [Buprenorphine] Itching    Itching and redness at the area of the patch.  Recardo Evangelist [Pregabalin]     Headache,severe eye pain, swelling  . Nsaids     Stomach pain  . Tolmetin Other (See Comments)    Stomach pain    Current Outpatient Prescriptions on File Prior to Visit  Medication Sig Dispense Refill  . acetaminophen-codeine  (TYLENOL #4) 300-60 MG tablet Take 1 tablet by mouth daily. 30 tablet 2  . lovastatin (MEVACOR) 20 MG tablet Take 1 tablet (20 mg total) by mouth at bedtime. 90 tablet 3  . polyethylene glycol powder (GLYCOLAX/MIRALAX) powder Take 17 g by mouth 2 (two) times daily as needed. 3350 g 6   No current facility-administered medications on file prior to visit.     BP 119/76 (BP Location: Right Arm, Cuff Size: Large)   Pulse 83   Temp 97.7 F (36.5 C) (Oral)   Resp 18   Ht 5' 8.5" (1.74 m)   Wt 221 lb 9.6 oz (100.5 kg)   SpO2 98%   BMI 33.20 kg/m     Objective:   Physical Exam  Constitutional: She is oriented to person, place, and time. She appears well-developed and well-nourished. No distress.  Musculoskeletal: She exhibits no edema.  + tenderness to palpation of left 3rd and 4th metatarsals  Neurological: She is alert and oriented to person, place, and time.  Skin: Skin is warm.  Psychiatric: She has a normal mood and affect. Her behavior is normal. Judgment and thought content normal.  Assessment & Plan:  Left Foot pain- check x ray to rule out fracture. If + for fracture plan referral to orthopedics.

## 2017-02-14 NOTE — Patient Instructions (Signed)
Please complete x ray on the first floor. We will contact you with your results.

## 2017-03-18 DIAGNOSIS — H02822 Cysts of right lower eyelid: Secondary | ICD-10-CM | POA: Diagnosis not present

## 2017-03-18 DIAGNOSIS — D485 Neoplasm of uncertain behavior of skin: Secondary | ICD-10-CM | POA: Diagnosis not present

## 2017-03-18 DIAGNOSIS — H02821 Cysts of right upper eyelid: Secondary | ICD-10-CM | POA: Diagnosis not present

## 2017-03-19 DIAGNOSIS — L304 Erythema intertrigo: Secondary | ICD-10-CM | POA: Diagnosis not present

## 2017-03-19 DIAGNOSIS — L814 Other melanin hyperpigmentation: Secondary | ICD-10-CM | POA: Diagnosis not present

## 2017-03-19 DIAGNOSIS — L218 Other seborrheic dermatitis: Secondary | ICD-10-CM | POA: Diagnosis not present

## 2017-03-19 DIAGNOSIS — L821 Other seborrheic keratosis: Secondary | ICD-10-CM | POA: Diagnosis not present

## 2017-03-19 DIAGNOSIS — D1801 Hemangioma of skin and subcutaneous tissue: Secondary | ICD-10-CM | POA: Diagnosis not present

## 2017-03-19 DIAGNOSIS — Z85828 Personal history of other malignant neoplasm of skin: Secondary | ICD-10-CM | POA: Diagnosis not present

## 2017-03-19 DIAGNOSIS — D225 Melanocytic nevi of trunk: Secondary | ICD-10-CM | POA: Diagnosis not present

## 2017-03-28 ENCOUNTER — Encounter: Payer: Self-pay | Admitting: Physical Medicine & Rehabilitation

## 2017-03-28 ENCOUNTER — Encounter: Payer: PPO | Attending: Physical Medicine & Rehabilitation

## 2017-03-28 ENCOUNTER — Ambulatory Visit (HOSPITAL_BASED_OUTPATIENT_CLINIC_OR_DEPARTMENT_OTHER): Payer: PPO | Admitting: Physical Medicine & Rehabilitation

## 2017-03-28 VITALS — BP 111/54 | HR 82 | Resp 14

## 2017-03-28 DIAGNOSIS — G894 Chronic pain syndrome: Secondary | ICD-10-CM | POA: Diagnosis not present

## 2017-03-28 DIAGNOSIS — G8929 Other chronic pain: Secondary | ICD-10-CM

## 2017-03-28 DIAGNOSIS — Z5181 Encounter for therapeutic drug level monitoring: Secondary | ICD-10-CM | POA: Diagnosis not present

## 2017-03-28 DIAGNOSIS — M545 Low back pain, unspecified: Secondary | ICD-10-CM

## 2017-03-28 DIAGNOSIS — M654 Radial styloid tenosynovitis [de Quervain]: Secondary | ICD-10-CM

## 2017-03-28 DIAGNOSIS — M4802 Spinal stenosis, cervical region: Secondary | ICD-10-CM

## 2017-03-28 DIAGNOSIS — Z79899 Other long term (current) drug therapy: Secondary | ICD-10-CM | POA: Insufficient documentation

## 2017-03-28 MED ORDER — ACETAMINOPHEN-CODEINE #4 300-60 MG PO TABS: 1.0000 | ORAL_TABLET | Freq: Every day | ORAL | 2 refills | Status: DC

## 2017-03-28 MED ORDER — DICLOFENAC SODIUM 1 % TD GEL: 2.0000 g | Freq: Four times a day (QID) | TRANSDERMAL | 1 refills | Status: DC

## 2017-03-28 NOTE — Patient Instructions (Signed)
Use a thumb SPICA splint Left hand for 4weeks Use cream 4 times a day  If no better, please call for Left wrist injection  Melatonin pill 1-5 mg at night for sleep as needed

## 2017-03-28 NOTE — Progress Notes (Signed)
Subjective:    Patient ID: Susan Cortez, female    DOB: 1961/06/11, 56 y.o.   MRN: [id number]  HPI CC Right neck and shoulder pain  Left wrist pain- no fall pain with driving or holding a Kindle in left hand.  Occ numbness and tingling in fingers, dorsal thumb and index.  No prior hx of carpal tunnel. No left side neck pain   No progressive weakness in the left upper extremity.  Back pain with making a bed.  No hx of lumbar surgery. Patient does not have any radiating pain to her legs.  Prior hx of chiropracter for neck pain, had increased back pain   Pain Inventory Average Pain 6 Pain Right Now 7 My pain is constant, sharp, burning, dull and stabbing  In the last 24 hours, has pain interfered with the following? General activity 6 Relation with others 6 Enjoyment of life 6 What TIME of day is your pain at its worst? evening Sleep (in general) Poor  Pain is worse with: walking, standing and some activites Pain improves with: rest, heat/ice and medication Relief from Meds: 7  Mobility walk without assistance how many minutes can you walk? 30 ability to climb steps?  yes do you drive?  yes Do you have any goals in this area?  no  Function disabled: date disabled . Do you have any goals in this area?  no  Neuro/Psych No problems in this area  Prior Studies Any changes since last visit?  no  Physicians involved in your care Any changes since last visit?  no   Family History  Problem Relation Age of Onset  . Diabetes Father   . Heart disease Father   . Hyperlipidemia Father    Social History   Social History  . Marital status: Divorced    Spouse name: N/A  . Number of children: N/A  . Years of education: N/A   Social History Main Topics  . Smoking status: Current Every Day Smoker    Packs/day: 1.00    Years: 40.00  . Smokeless tobacco: Never Used  . Alcohol use No  . Drug use: No  . Sexual activity: No   Other Topics Concern  . None   Social  History Narrative  . None   Past Surgical History:  Procedure Laterality Date  . CESAREAN SECTION    . SHOULDER ARTHROSCOPY WITH OPEN ROTATOR CUFF REPAIR  05/2009  . TONSILLECTOMY  1977  . TUBAL LIGATION     Past Medical History:  Diagnosis Date  . Allergy   . Arthritis    BP (!) 111/54 (BP Location: Left Arm, Patient Position: Sitting, Cuff Size: Large)   Pulse 82   Resp 14   SpO2 92%   Opioid Risk Score:   Fall Risk Score:  `1  Depression screen PHQ 2/9  Depression screen [facility] 2/9 09/05/2016 04/03/2016 12/26/2015 05/05/2015 11/22/2014  Decreased Interest 0 3 0 3 3  Down, Depressed, Hopeless 0 0 0 0 0  PHQ - 2 Score 0 3 0 3 3  Altered sleeping - - - - 1  Tired, decreased energy - - - - 3  Change in appetite - - - - 0  Feeling bad or failure about yourself  - - - - 0  Trouble concentrating - - - - 2  Moving slowly or fidgety/restless - - - - 0  Suicidal thoughts - - - - 0  PHQ-9 Score - - - - 9  Review of Systems  Constitutional: Positive for diaphoresis.  HENT: Negative.   Eyes: Negative.   Respiratory: Negative.   Cardiovascular: Negative.   Gastrointestinal: Negative.   Endocrine: Negative.   Genitourinary: Negative.   Musculoskeletal: Positive for arthralgias and neck pain.  Allergic/Immunologic: Negative.   Neurological: Negative.   Hematological: Negative.   Psychiatric/Behavioral: Negative.        Objective:   Physical Exam  Constitutional: She is oriented to person, place, and time. She appears well-developed and well-nourished.  HENT:  Head: Normocephalic and atraumatic.  Eyes: Pupils are equal, round, and reactive to light. Conjunctivae and EOM are normal.  Neck: Normal range of motion.  Cardiovascular: Normal rate, regular rhythm and normal heart sounds.   No murmur heard. Pulmonary/Chest: Effort normal and breath sounds normal. No respiratory distress. She has no wheezes.  Abdominal: Soft. Bowel sounds are normal. She exhibits no distension.  There is no tenderness.  Musculoskeletal: Normal range of motion. She exhibits no deformity.       Left wrist: She exhibits tenderness. She exhibits normal range of motion, no swelling, no effusion and no deformity.       Lumbar back: She exhibits tenderness. She exhibits normal range of motion and no deformity.  Positive Finkelstein's test left side Negative snuffbox tenderness  Negative Tinel's at the wrist.   Negative straight leg raising test  Neurological: She is alert and oriented to person, place, and time. No sensory deficit. Gait normal.  Reflex Scores:      Tricep reflexes are 1+ on the right side and 2+ on the left side.      Bicep reflexes are 1+ on the right side and 2+ on the left side.      Brachioradialis reflexes are 1+ on the right side and 2+ on the left side.      Patellar reflexes are 1+ on the right side and 1+ on the left side.      Achilles reflexes are 1+ on the right side and 1+ on the left side. Sensation intact to pinprick and light touch bilateral C5, C6, C7, C8 dermatomal distribution  Skin: Skin is warm and dry.  Psychiatric: She has a normal mood and affect. Her behavior is normal. Judgment and thought content normal.  Nursing note and vitals reviewed.         Assessment & Plan:  1. History of cervical spinal stenosis with chronic neck pain. She does have left wrist and hand pain, but this does not appear to be radicular. Neurologic exam is normal.  Continue Tylenol No. 4 one by mouth daily at bedtime Helpful for pain which interferes with sleep  2. Left wrist pain, first extensor compartment Teno synovitis. Left thumb spica, Voltaren gel 4 times per day If no improvement, consider injection of first extensor compartment tendon sheath under ultrasound guidance   3. Chronic low back pain. No evidence of radiculopathy. Neurologic exam is negative.  No specific treatment needed. This is episodic during certain activities

## 2017-04-18 DIAGNOSIS — H04561 Stenosis of right lacrimal punctum: Secondary | ICD-10-CM | POA: Diagnosis not present

## 2017-04-18 DIAGNOSIS — D485 Neoplasm of uncertain behavior of skin: Secondary | ICD-10-CM | POA: Diagnosis not present

## 2017-06-26 ENCOUNTER — Telehealth: Payer: Self-pay | Admitting: Registered Nurse

## 2017-06-26 ENCOUNTER — Encounter: Payer: PPO | Attending: Physical Medicine & Rehabilitation | Admitting: Registered Nurse

## 2017-06-26 ENCOUNTER — Encounter: Payer: Self-pay | Admitting: Registered Nurse

## 2017-06-26 VITALS — BP 109/73 | HR 75

## 2017-06-26 DIAGNOSIS — Z5181 Encounter for therapeutic drug level monitoring: Secondary | ICD-10-CM | POA: Insufficient documentation

## 2017-06-26 DIAGNOSIS — M25511 Pain in right shoulder: Secondary | ICD-10-CM

## 2017-06-26 DIAGNOSIS — M545 Low back pain: Secondary | ICD-10-CM | POA: Diagnosis not present

## 2017-06-26 DIAGNOSIS — Z79899 Other long term (current) drug therapy: Secondary | ICD-10-CM | POA: Diagnosis not present

## 2017-06-26 DIAGNOSIS — G894 Chronic pain syndrome: Secondary | ICD-10-CM | POA: Insufficient documentation

## 2017-06-26 DIAGNOSIS — M4802 Spinal stenosis, cervical region: Secondary | ICD-10-CM

## 2017-06-26 DIAGNOSIS — G8929 Other chronic pain: Secondary | ICD-10-CM | POA: Insufficient documentation

## 2017-06-26 MED ORDER — ACETAMINOPHEN-CODEINE #4 300-60 MG PO TABS: 1.0000 | ORAL_TABLET | Freq: Every day | ORAL | 2 refills | Status: DC

## 2017-06-26 NOTE — Progress Notes (Signed)
Subjective:    Patient ID: Susan Cortez, female    DOB: Dec 20, 1960, 56 y.o.   MRN: [id number]  HPI: Susan Cortez is a 56year old female who returns for follow up appointment for chronic pain and medication refill. She statesher pain is located in her neck,right shoulder and lower back occassionally with activities.  She rates her pain 7. Her current exercise regime is walking and performing stretching exercises with bands.  Susan Cortez Morphine equivalent is  9.0 MME.    Last UDS was performed on 12/31/2016, it was consistent.    Pain Inventory Average Pain 6 Pain Right Now 7 My pain is burning, stabbing and aching  In the last 24 hours, has pain interfered with the following? General activity 5 Relation with others 5 Enjoyment of life 5 What TIME of day is your pain at its worst? evening and night Sleep (in general) Fair  Pain is worse with: walking, bending, standing and some activites Pain improves with: rest, pacing activities and medication Relief from Meds: 8  Mobility walk without assistance how many minutes can you walk? 30 ability to climb steps?  yes do you drive?  yes Do you have any goals in this area?  no  Function disabled: date disabled . Do you have any goals in this area?  no  Neuro/Psych No problems in this area  Prior Studies Any changes since last visit?  no  Physicians involved in your care Any changes since last visit?  no   Family History  Problem Relation Age of Onset  . Diabetes Father   . Heart disease Father   . Hyperlipidemia Father    Social History   Social History  . Marital status: Divorced    Spouse name: N/A  . Number of children: N/A  . Years of education: N/A   Social History Main Topics  . Smoking status: Current Every Day Smoker    Packs/day: 1.00    Years: 40.00  . Smokeless tobacco: Never Used  . Alcohol use No  . Drug use: No  . Sexual activity: No   Other Topics Concern  . None   Social  History Narrative  . None   Past Surgical History:  Procedure Laterality Date  . CESAREAN SECTION    . SHOULDER ARTHROSCOPY WITH OPEN ROTATOR CUFF REPAIR  05/2009  . TONSILLECTOMY  1977  . TUBAL LIGATION     Past Medical History:  Diagnosis Date  . Allergy   . Arthritis    There were no vitals taken for this visit.  Opioid Risk Score:  0 Fall Risk Score:  `1  Depression screen PHQ 2/9  Depression screen [facility] 2/9 06/26/2017 09/05/2016 04/03/2016 12/26/2015 05/05/2015 11/22/2014  Decreased Interest 0 0 3 0 3 3  Down, Depressed, Hopeless 0 0 0 0 0 0  PHQ - 2 Score 0 0 3 0 3 3  Altered sleeping - - - - - 1  Tired, decreased energy - - - - - 3  Change in appetite - - - - - 0  Feeling bad or failure about yourself  - - - - - 0  Trouble concentrating - - - - - 2  Moving slowly or fidgety/restless - - - - - 0  Suicidal thoughts - - - - - 0  PHQ-9 Score - - - - - 9     Review of Systems  Constitutional: Positive for diaphoresis.  HENT: Negative.   Eyes: Negative.   Respiratory:  Negative.   Cardiovascular: Negative.   Gastrointestinal: Positive for constipation.  Endocrine: Negative.   Genitourinary: Negative.   Musculoskeletal: Positive for back pain.  Skin: Negative.   Allergic/Immunologic: Negative.   Neurological: Negative.   Hematological: Negative.   Psychiatric/Behavioral: Negative.        Objective:   Physical Exam  Constitutional: She is oriented to person, place, and time. She appears well-developed and well-nourished.  HENT:  Head: Normocephalic and atraumatic.  Neck: Normal range of motion. Neck supple.  Cardiovascular: Normal rate and regular rhythm.   Pulmonary/Chest: Effort normal and breath sounds normal.  Musculoskeletal:  Normal Muscle Bulk and Muscle Testing Reveals: Upper Extremities: Right: Decreased ROM 90 Degrees and Muscle Strength 4/5 and Left: Full ROM and Muscle Strength 5/5 Right AC Joint Tenderness Thoracic Paraspinal Tenderness:  T-1-T-7 Lower Extremities: Full ROM and Muscle Strength 5/5 Arises from Table with ease Narrow Based Gait     Neurological: She is alert and oriented to person, place, and time.  Skin: Skin is warm and dry.  Psychiatric: She has a normal mood and affect.  Nursing note and vitals reviewed.         Assessment & Plan:  1.Cervical spinal stenosis without evidence of radiculopathy. Chronic neck pain: Refilled Tylenol #4 300/60mg  daily HS.#30. 06/26/2017 2. Right shoulder rotator cuff tear, status post repair with chronic pain and contracture : Continue with exercise regime as tolerated .06/26/2017 3. Insomnia: Continue Melatonin. 06/26/2017 4. Chronic Bilateral Lower Back Pain without Sciatica: Continue HEP as tolerated. Continue to Monitor. 06/26/2017   15 minutes of face to face patient care time was spent during this visit. All questions were encouraged and answered.   F/U 3 months

## 2017-06-26 NOTE — Telephone Encounter (Signed)
On 06/26/2017 the  [place] was reviewed no conflict was seen on the [place] with multiple prescribers. Susan Cortez has a signed narcotic contract with our office. If there were any discrepancies this would have been reported to her physician.

## 2017-06-28 ENCOUNTER — Ambulatory Visit: Payer: PPO | Admitting: Registered Nurse

## 2017-07-09 NOTE — Progress Notes (Addendum)
[place] [facility] [address], [place], [place] [address] [phone]  Date:  07/10/2017   Name:  Susan Cortez   DOB:  02-28-1961   MRN:  [id number]  PCP:  Darreld Mclean, MD    Chief Complaint: Follow-up (50month )   History of Present Illness:  Susan Cortez is a 56 y.o. very pleasant female patient who presents with the following:  I last saw her in May as follows:  She developed sleep issues with Wellbutrin and stopped using it.  She could not afford phentermine either so has not been able to take it either.  Her weight is stable.  She has been occupied by some health issues in her family so smoking cessation/ weight loss are on the back burning for now  She is still smoking 1 PPD but continues to want to quit Negative pap last year- will not do today She is fasting today for labs  Last mamo this year - February She is on a 3 year plan for her colonoscopy she had polyps on her last scope; will be due in 2019 She took part in a research study at [facility] in March- they did a cardiac MRI which was normal, she brings a copy of this with her today. She occasionally will participate in medical studies as she hopes to help advance medical knowledge She has noted a mole on her right jaw for the last 4-5 months; no history of skin cancer. She is a bit concerned about this mole  She is also seen at [facility] to manage her chronic pain.  Form most recent note:  1.Cervical spinal stenosis without evidence ofradiculopathy. Chronic neck pain: Refilled Tylenol #4 300/60mg  daily HS.#30.06/26/2017 2. Right shoulder rotator cuff tear, status post repair with chronic pain and contracture : Continue with exercise regime as tolerated .06/26/2017 3. Insomnia: Continue Melatonin. 06/26/2017 4. Chronic Bilateral Lower Back Pain without Sciatica: Continue HEP as tolerated. Continue to Monitor. 06/26/2017   WYO:VZCHYIFO, as she got sick last  time she had this  We did find a skin cancer on her earlier this year- she followed up with derm, they did some further resection and they will see her in January   Lab Results  Component Value Date   HGBA1C 5.9 12/27/2016   She does have pre-diabetes She is still smoking- she is planning to try and quit in the near future.   She is due for a colonoscopy in April- she has a 3 year follow-up.  She would like to see [facility] this time as she was not as happy with her last provider   She has been told that her white blood cell count was "slightly high" for quite some time- she is not sure how long- but no further evaluation done so far  Patient Active Problem List   Diagnosis Date Noted  . De Quervain's disease (tenosynovitis) 03/28/2017  . Chronic bilateral low back pain without sciatica 03/28/2017  . Sebaceous hyperplasia of face 06/26/2016  . Chronic right shoulder pain 01/02/2016  . Adenoma of left adrenal gland 12/26/2015  . Intermittent claudication ([place]) 12/26/2015  . Rotator cuff (capsule) sprain 03/26/2014  . Spinal stenosis in cervical region 03/26/2014  . Chronic pain 03/26/2014    Past Medical History:  Diagnosis Date  . Allergy   . Arthritis     Past Surgical History:  Procedure Laterality Date  . CESAREAN SECTION    .  SHOULDER ARTHROSCOPY WITH OPEN ROTATOR CUFF REPAIR  05/2009  . TONSILLECTOMY  1977  . TUBAL LIGATION      Social History   Tobacco Use  . Smoking status: Current Every Day Smoker    Packs/day: 1.00    Years: 40.00    Pack years: 40.00  . Smokeless tobacco: Never Used  Substance Use Topics  . Alcohol use: No  . Drug use: No    Family History  Problem Relation Age of Onset  . Diabetes Father   . Heart disease Father   . Hyperlipidemia Father     Allergies  Allergen Reactions  . Chantix [Varenicline] Other (See Comments)    Strange dreams/hallucinations  . Diclofenac Swelling  . Meloxicam Swelling  . Butrans [Buprenorphine]  Itching    Itching and redness at the area of the patch.  Recardo Evangelist [Pregabalin]     Headache,severe eye pain, swelling  . Nsaids     Stomach pain  . Tolmetin Other (See Comments)    Stomach pain    Medication list has been reviewed and updated.  Current Outpatient Medications on File Prior to Visit  Medication Sig Dispense Refill  . acetaminophen-codeine (TYLENOL #4) 300-60 MG tablet Take 1 tablet by mouth daily. 30 tablet 2  . diclofenac sodium (VOLTAREN) 1 % GEL Apply 2 g topically 4 (four) times daily. 3 Tube 1  . lovastatin (MEVACOR) 20 MG tablet Take 1 tablet (20 mg total) by mouth at bedtime. 90 tablet 3   No current facility-administered medications on file prior to visit.     Review of Systems:  As per HPI- otherwise negative.   Physical Examination: Vitals:   07/10/17 1000  BP: 122/74  Pulse: 97  Temp: (!) 97.5 F (36.4 C)  SpO2: 98%   Vitals:   07/10/17 1000  Weight: 219 lb (99.3 kg)   Body mass index is 32.81 kg/m. Ideal Body Weight:    GEN: WDWN, NAD, Non-toxic, A & O x 3, obese, smoker otherwise looks well HEENT: Atraumatic, Normocephalic. Neck supple. No masses, No LAD.  Bilateral TM wnl, oropharynx normal.  PEERL,EOMI.   Ears and Nose: No external deformity. CV: RRR, No M/G/R. No JVD. No thrill. No extra heart sounds. PULM: CTA B, no wheezes, crackles, rhonchi. No retractions. No resp. distress. No accessory muscle use. ABD: S, NT, ND. No rebound. No HSM. EXTR: No c/c/e NEURO Normal gait.  PSYCH: Normally interactive. Conversant. Not depressed or anxious appearing.  Calm demeanor.    Assessment and Plan: Leukocytosis, unspecified type - Plan: CBC  Polyp of colon, unspecified part of colon, unspecified type - Plan: Ambulatory referral to Gastroenterology  Non-restorative sleep - Plan: Ambulatory referral to Neurology  Snoring - Plan: Ambulatory referral to Neurology  Tobacco abuse  Pre-diabetes - Plan: Hemoglobin A1c  Chronic  constipation - Plan: polyethylene glycol powder (GLYCOLAX/MIRALAX) powder  Here today for a follow-up visit She needs a GI referral and also eval for possible sleep apnea She continues to smoke- encouraged her to quit and she is thinking about it Noted mild but persistent leukocytosis- will repeat for her today. If continues will need to have her see GI She also needs a RF of her miralax today   Signed Lamar Blinks, MD  Results to pt- white blood cell count is normal and her A1c is in normal range   Results for orders placed or performed in visit on 07/10/17  CBC  Result Value Ref Range   WBC 10.0 4.0 -  10.5 K/uL   RBC 4.74 3.87 - 5.11 Mil/uL   Platelets 424.0 (H) 150.0 - 400.0 K/uL   Hemoglobin 14.9 12.0 - 15.0 g/dL   HCT 45.4 36.0 - 46.0 %   MCV 95.6 78.0 - 100.0 fl   MCHC 32.9 30.0 - 36.0 g/dL   RDW 13.6 11.5 - 15.5 %  Hemoglobin A1c  Result Value Ref Range   Hgb A1c MFr Bld 5.6 4.6 - 6.5 %

## 2017-07-10 ENCOUNTER — Ambulatory Visit: Payer: PPO | Admitting: Family Medicine

## 2017-07-10 ENCOUNTER — Encounter: Payer: Self-pay | Admitting: Family Medicine

## 2017-07-10 VITALS — BP 122/74 | HR 97 | Temp 97.5°F | Wt 219.0 lb

## 2017-07-10 DIAGNOSIS — K635 Polyp of colon: Secondary | ICD-10-CM

## 2017-07-10 DIAGNOSIS — G478 Other sleep disorders: Secondary | ICD-10-CM

## 2017-07-10 DIAGNOSIS — R7303 Prediabetes: Secondary | ICD-10-CM

## 2017-07-10 DIAGNOSIS — R0683 Snoring: Secondary | ICD-10-CM

## 2017-07-10 DIAGNOSIS — Z720 Tobacco use: Secondary | ICD-10-CM

## 2017-07-10 DIAGNOSIS — D72829 Elevated white blood cell count, unspecified: Secondary | ICD-10-CM | POA: Diagnosis not present

## 2017-07-10 DIAGNOSIS — K5909 Other constipation: Secondary | ICD-10-CM | POA: Diagnosis not present

## 2017-07-10 LAB — CBC
HCT: 45.4 % (ref 36.0–46.0)
HEMOGLOBIN: 14.9 g/dL (ref 12.0–15.0)
MCHC: 32.9 g/dL (ref 30.0–36.0)
MCV: 95.6 fl (ref 78.0–100.0)
Platelets: 424 10*3/uL — ABNORMAL HIGH (ref 150.0–400.0)
RBC: 4.74 Mil/uL (ref 3.87–5.11)
RDW: 13.6 % (ref 11.5–15.5)
WBC: 10 10*3/uL (ref 4.0–10.5)

## 2017-07-10 LAB — HEMOGLOBIN A1C: Hgb A1c MFr Bld: 5.6 % (ref 4.6–6.5)

## 2017-07-10 MED ORDER — POLYETHYLENE GLYCOL 3350 17 GM/SCOOP PO POWD: 17.0000 g | Freq: Two times a day (BID) | ORAL | 6 refills | Status: DC | PRN

## 2017-07-10 NOTE — Patient Instructions (Signed)
It was good to see you today!   We will check your blood count- if your white count continues to be slighty high I will ask hematology to consult We will set you up with a Gastroenterology doctor for your upcoming colonoscopy I will also refer you to have a sleep study for suspected sleep apnea  Please do work on quitting smoking!

## 2017-07-24 ENCOUNTER — Encounter: Payer: Self-pay | Admitting: Family Medicine

## 2017-07-31 ENCOUNTER — Ambulatory Visit (INDEPENDENT_AMBULATORY_CARE_PROVIDER_SITE_OTHER): Payer: PPO | Admitting: Neurology

## 2017-07-31 ENCOUNTER — Encounter: Payer: Self-pay | Admitting: Neurology

## 2017-07-31 VITALS — BP 152/90 | HR 84 | Ht 68.0 in | Wt 221.0 lb

## 2017-07-31 DIAGNOSIS — F1190 Opioid use, unspecified, uncomplicated: Secondary | ICD-10-CM | POA: Diagnosis not present

## 2017-07-31 DIAGNOSIS — G478 Other sleep disorders: Secondary | ICD-10-CM

## 2017-07-31 DIAGNOSIS — R0683 Snoring: Secondary | ICD-10-CM | POA: Diagnosis not present

## 2017-07-31 DIAGNOSIS — R51 Headache: Secondary | ICD-10-CM

## 2017-07-31 DIAGNOSIS — R351 Nocturia: Secondary | ICD-10-CM

## 2017-07-31 DIAGNOSIS — G4719 Other hypersomnia: Secondary | ICD-10-CM | POA: Diagnosis not present

## 2017-07-31 DIAGNOSIS — R519 Headache, unspecified: Secondary | ICD-10-CM

## 2017-07-31 DIAGNOSIS — Z820 Family history of epilepsy and other diseases of the nervous system: Secondary | ICD-10-CM

## 2017-07-31 DIAGNOSIS — E669 Obesity, unspecified: Secondary | ICD-10-CM | POA: Diagnosis not present

## 2017-07-31 NOTE — Progress Notes (Signed)
Subjective:    Patient ID: Susan Cortez is a 56 y.o. female.  HPI     Star Age, MD, PhD [facility] [address] [place], [place] [address]  Dear Dr. Lorelei Pont,   I saw your patient, Susan Cortez, upon your kind request in my neurologic clinic today for initial consultation of her sleep disorder, in particular, concern for underlying obstructive sleep apnea. The patient is unaccompanied today. As you know, Ms. Lourenco is a 56 year old right-handed woman with an underlying medical history of allergies, arthritis, neck pain with cervical spinal stenosis (on Tylenol #4, per Dr. Letta Pate), nonspecific leukocytosis, low back pain, smoking, and obesity, who reports snoring and excessive daytime somnolence. I reviewed your office note from 07/10/2017. Her Epworth sleepiness score is 12 out of 24, fatigue score is 55 out of 63. She is single and lives alone, she has 3 children. She smokes about a pack per day, does not consume alcohol except for rare occasions, drinks caffeine in the form of coffee, 2-3 cups per day on average, has actually cut way back. She reports being a night owl, generally goes to bed between 11 PM and 1 AM. Wake up time varies, recently around 8 AM. She has multiple nighttime awakenings and multiple issues with sleep disruption, often due to nocturia which can be 3 or 4 times per average night or even more. She has had occasional morning headaches. She has a remote history of rare migraines. She had a tonsillectomy at age 34 due to recurrent pharyngitis. She has a family history of OSA in her brother and her sister who both have CPAP machines. She suspects that her father had OSA as well. She denies telltale symptoms of restless leg syndrome. She moved from [place] some 4 years ago to be closer to her family and her grandson. She has 3 grown daughters.   Her Past Medical History Is Significant For: Past Medical History:   Diagnosis Date  . Allergy   . Arthritis     Her Past Surgical History Is Significant For: Past Surgical History:  Procedure Laterality Date  . CESAREAN SECTION    . SHOULDER ARTHROSCOPY WITH OPEN ROTATOR CUFF REPAIR  05/2009  . TONSILLECTOMY  1977  . TUBAL LIGATION      Her Family History Is Significant For: Family History  Problem Relation Age of Onset  . Diabetes Father   . Heart disease Father   . Hyperlipidemia Father     Her Social History Is Significant For: Social History   Socioeconomic History  . Marital status: Divorced    Spouse name: None  . Number of children: None  . Years of education: None  . Highest education level: None  Social Needs  . Financial resource strain: None  . Food insecurity - worry: None  . Food insecurity - inability: None  . Transportation needs - medical: None  . Transportation needs - non-medical: None  Occupational History  . None  Tobacco Use  . Smoking status: Current Every Day Smoker    Packs/day: 1.00    Years: 40.00    Pack years: 40.00  . Smokeless tobacco: Never Used  Substance and Sexual Activity  . Alcohol use: No  . Drug use: No  . Sexual activity: No  Other Topics Concern  . None  Social History Narrative  . None    Her Allergies Are:  Allergies  Allergen Reactions  . Chantix [Varenicline] Other (See Comments)  Strange dreams/hallucinations  . Diclofenac Swelling  . Meloxicam Swelling  . Butrans [Buprenorphine] Itching    Itching and redness at the area of the patch.  Recardo Evangelist [Pregabalin]     Headache,severe eye pain, swelling  . Nsaids     Stomach pain  . Tolmetin Other (See Comments)    Stomach pain  :   Her Current Medications Are:  Outpatient Encounter Medications as of 07/31/2017  Medication Sig  . acetaminophen-codeine (TYLENOL #4) 300-60 MG tablet Take 1 tablet by mouth daily.  . diclofenac sodium (VOLTAREN) 1 % GEL Apply 2 g topically 4 (four) times daily.  Marland Kitchen lovastatin (MEVACOR) 20  MG tablet Take 1 tablet (20 mg total) by mouth at bedtime.  . polyethylene glycol powder (GLYCOLAX/MIRALAX) powder Take 17 g 2 (two) times daily as needed by mouth. Use for constipation   No facility-administered encounter medications on file as of 07/31/2017.   :  Review of Systems:  Out of a complete 14 point review of systems, all are reviewed and negative with the exception of these symptoms as listed below: Review of Systems  Neurological:       Pt presents today to discuss her sleep. Pt has never had a sleep study but does endorse snoring.  Epworth Sleepiness Scale 0= would never doze 1= slight chance of dozing 2= moderate chance of dozing 3= high chance of dozing  Sitting and reading: 3 Watching TV: 3 Sitting inactive in a public place (ex. Theater or meeting): 3 As a passenger in a car for an hour without a break: 0 Lying down to rest in the afternoon: 3 Sitting and talking to someone: 0 Sitting quietly after lunch (no alcohol): 0 In a car, while stopped in traffic: 0 Total: 12     Objective:  Neurological Exam  Physical Exam Physical Examination:   Vitals:   07/31/17 0848  BP: (!) 152/90  Pulse: 84    General Examination: The patient is a very pleasant 56 y.o. female in no acute distress. She appears well-developed and well-nourished and well groomed.   HEENT: Normocephalic, atraumatic, pupils are equal, round and reactive to light and accommodation. She has corrective eyeglasses. Extraocular tracking is good without limitation to gaze excursion or nystagmus noted. Normal smooth pursuit is noted. Hearing is grossly intact. Face is symmetric with normal facial animation and normal facial sensation. Speech is clear with no dysarthria noted. There is no hypophonia. There is no lip, neck/head, jaw or voice tremor. Neck is supple with full range of passive and active motion. There are no carotid bruits on auscultation. Oropharynx exam reveals: moderate mouth dryness,  adequate dental hygiene and mild airway crowding, due to smaller airway entry. Thicker tongue. Tonsils absent. . Mallampati is class I. Tongue protrudes centrally and palate elevates symmetrically. Neck size is 16.5 inches. She has a Mild overbite.   Chest: Clear to auscultation without wheezing, rhonchi or crackles noted.  Heart: S1+S2+0, regular and normal without murmurs, rubs or gallops noted.   Abdomen: Soft, non-tender and non-distended with normal bowel sounds appreciated on auscultation.  Extremities: There is no pitting edema in the distal lower extremities bilaterally. Pedal pulses are intact.  Skin: Warm and dry without trophic changes noted.  Musculoskeletal: exam reveals no obvious joint deformities, tenderness or joint swelling or erythema.   Neurologically:  Mental status: The patient is awake, alert and oriented in all 4 spheres. Her immediate and remote memory, attention, language skills and fund of knowledge are appropriate. There  is no evidence of aphasia, agnosia, apraxia or anomia. Speech is clear with normal prosody and enunciation. Thought process is linear. Mood is normal and affect is normal.  Cranial nerves II - XII are as described above under HEENT exam. In addition: shoulder shrug is normal with equal shoulder height noted. Motor exam: Normal bulk, strength and tone is noted. There is no drift, tremor or rebound. Reflexes are 1+ throughout. Fine motor skills and coordination: grossly intact.  Cerebellar testing: No dysmetria or intention tremor. There is no truncal or gait ataxia.  Sensory exam: intact to light touch.  Gait, station and balance: She stands easily. No veering to one side is noted. No leaning to one side is noted. Posture is age-appropriate and stance is narrow based. Gait shows normal stride length and normal pace. No problems turning are noted.   Assessment and Plan:  In summary, Trinadee Molnar is a very pleasant 56 y.o.-year old female with an  underlying medical history of allergies, arthritis, neck pain with cervical spinal stenosis (on Tylenol #4, per Dr. Letta Pate), nonspecific leukocytosis, low back pain, smoking, and obesity, whose history and physical exam are concerning for obstructive sleep apnea (OSA). I had a long chat with the patient about my findings and the diagnosis of OSA, its prognosis and treatment options. We talked about medical treatments, surgical interventions and non-pharmacological approaches. I explained in particular the risks and ramifications of untreated moderate to severe OSA, especially with respect to developing cardiovascular disease down the Road, including congestive heart failure, difficult to treat hypertension, cardiac arrhythmias, or stroke. Even type 2 diabetes has, in part, been linked to untreated OSA. Symptoms of untreated OSA include daytime sleepiness, memory problems, mood irritability and mood disorder such as depression and anxiety, lack of energy, as well as recurrent headaches, especially morning headaches. We talked about smoking cessation and trying to maintain a healthy lifestyle in general, as well as the importance of weight control. I encouraged the patient to eat healthy, exercise daily and keep well hydrated, to keep a scheduled bedtime and wake time routine, to not skip any meals and eat healthy snacks in between meals. I advised the patient not to drive when feeling sleepy.  I recommended the following at this time: sleep study with potential positive airway pressure titration. (We will score hypopneas at 4%).   I explained the sleep test procedure to the patient and also outlined possible surgical and non-surgical treatment options of OSA, including the use of a custom-made dental device (which would require a referral to a specialist dentist or oral surgeon), upper airway surgical options, such as pillar implants, radiofrequency surgery, tongue base surgery, and UPPP (which would involve  a referral to an ENT surgeon). Rarely, jaw surgery such as mandibular advancement may be considered.  I also explained the CPAP treatment option to the patient, who indicated that she would be willing to try CPAP if the need arises. I explained the importance of being compliant with PAP treatment, not only for insurance purposes but primarily to improve Her symptoms, and for the patient's long term health benefit, including to reduce Her cardiovascular risks. I answered all her questions today and the patient was in agreement. I would like to see her back after the sleep study is completed and encouraged her to call with any interim questions, concerns, problems or updates.   Thank you very much for allowing me to participate in the care of this nice patient. If I can be of any further  assistance to you please do not hesitate to call me at [phone].  Sincerely,   Star Age, MD, PhD

## 2017-07-31 NOTE — Patient Instructions (Signed)

## 2017-08-09 ENCOUNTER — Encounter: Payer: Self-pay | Admitting: Family Medicine

## 2017-08-11 DIAGNOSIS — R079 Chest pain, unspecified: Secondary | ICD-10-CM | POA: Diagnosis not present

## 2017-08-11 DIAGNOSIS — I517 Cardiomegaly: Secondary | ICD-10-CM | POA: Diagnosis not present

## 2017-08-11 DIAGNOSIS — R0602 Shortness of breath: Secondary | ICD-10-CM | POA: Diagnosis not present

## 2017-08-11 DIAGNOSIS — R000 Tachycardia, unspecified: Secondary | ICD-10-CM | POA: Diagnosis not present

## 2017-08-12 DIAGNOSIS — R000 Tachycardia, unspecified: Secondary | ICD-10-CM | POA: Diagnosis not present

## 2017-08-12 DIAGNOSIS — I517 Cardiomegaly: Secondary | ICD-10-CM | POA: Diagnosis not present

## 2017-09-16 ENCOUNTER — Ambulatory Visit: Payer: PPO | Admitting: Physical Medicine & Rehabilitation

## 2017-09-24 ENCOUNTER — Encounter: Payer: Self-pay | Admitting: Physical Medicine & Rehabilitation

## 2017-09-24 ENCOUNTER — Encounter: Payer: PPO | Attending: Physical Medicine & Rehabilitation

## 2017-09-24 ENCOUNTER — Ambulatory Visit: Payer: PPO | Admitting: Physical Medicine & Rehabilitation

## 2017-09-24 VITALS — BP 138/83 | HR 84

## 2017-09-24 DIAGNOSIS — Z9851 Tubal ligation status: Secondary | ICD-10-CM | POA: Diagnosis not present

## 2017-09-24 DIAGNOSIS — M4802 Spinal stenosis, cervical region: Secondary | ICD-10-CM | POA: Diagnosis not present

## 2017-09-24 DIAGNOSIS — M25511 Pain in right shoulder: Secondary | ICD-10-CM | POA: Diagnosis not present

## 2017-09-24 DIAGNOSIS — G894 Chronic pain syndrome: Secondary | ICD-10-CM | POA: Diagnosis not present

## 2017-09-24 DIAGNOSIS — Z833 Family history of diabetes mellitus: Secondary | ICD-10-CM | POA: Insufficient documentation

## 2017-09-24 DIAGNOSIS — Z8249 Family history of ischemic heart disease and other diseases of the circulatory system: Secondary | ICD-10-CM | POA: Diagnosis not present

## 2017-09-24 DIAGNOSIS — G8929 Other chronic pain: Secondary | ICD-10-CM

## 2017-09-24 DIAGNOSIS — Z5181 Encounter for therapeutic drug level monitoring: Secondary | ICD-10-CM | POA: Diagnosis not present

## 2017-09-24 DIAGNOSIS — M1990 Unspecified osteoarthritis, unspecified site: Secondary | ICD-10-CM | POA: Diagnosis not present

## 2017-09-24 DIAGNOSIS — Z87891 Personal history of nicotine dependence: Secondary | ICD-10-CM | POA: Insufficient documentation

## 2017-09-24 DIAGNOSIS — Z79899 Other long term (current) drug therapy: Secondary | ICD-10-CM | POA: Diagnosis not present

## 2017-09-24 DIAGNOSIS — Z9889 Other specified postprocedural states: Secondary | ICD-10-CM | POA: Insufficient documentation

## 2017-09-24 MED ORDER — ACETAMINOPHEN-CODEINE #4 300-60 MG PO TABS: 1.0000 | ORAL_TABLET | Freq: Every day | ORAL | 2 refills | Status: DC

## 2017-09-24 NOTE — Progress Notes (Signed)
Subjective:    Patient ID: Susan Cortez, female    DOB: [birth year], 57 y.o.   MRN: [id number]  HPI No change in neck pain No change in shoulder pain Patient has been busy visiting her mother up [place] in [place] had to drive 12 hours to get there.  Pain Inventory Average Pain 6 Pain Right Now 7 My pain is constant, burning, stabbing and aching  In the last 24 hours, has pain interfered with the following? General activity 5 Relation with others 2 Enjoyment of life 5 What TIME of day is your pain at its worst? evening Sleep (in general) Poor  Pain is worse with: walking, standing and some activites Pain improves with: rest, pacing activities and medication Relief from Meds: 9  Mobility walk without assistance  Function Do you have any goals in this area?  no  Neuro/Psych No problems in this area  Prior Studies Any changes since last visit?  no  Physicians involved in your care Any changes since last visit?  no   Family History  Problem Relation Age of Onset  . Diabetes Father   . Heart disease Father   . Hyperlipidemia Father    Social History   Socioeconomic History  . Marital status: Divorced    Spouse name: None  . Number of children: None  . Years of education: None  . Highest education level: None  Social Needs  . Financial resource strain: None  . Food insecurity - worry: None  . Food insecurity - inability: None  . Transportation needs - medical: None  . Transportation needs - non-medical: None  Occupational History  . None  Tobacco Use  . Smoking status: Current Every Day Smoker    Packs/day: 1.00    Years: 40.00    Pack years: 40.00  . Smokeless tobacco: Never Used  Substance and Sexual Activity  . Alcohol use: No  . Drug use: No  . Sexual activity: No  Other Topics Concern  . None  Social History Narrative  . None   Past Surgical History:  Procedure Laterality Date  . CESAREAN SECTION    . SHOULDER ARTHROSCOPY WITH OPEN  ROTATOR CUFF REPAIR  05/2009  . TONSILLECTOMY  1977  . TUBAL LIGATION     Past Medical History:  Diagnosis Date  . Allergy   . Arthritis    BP 138/83   Pulse 84   SpO2 96%   Opioid Risk Score:   Fall Risk Score:  `1  Depression screen PHQ 2/9  Depression screen [facility] 2/9 06/26/2017 09/05/2016 04/03/2016 12/26/2015 05/05/2015 11/22/2014  Decreased Interest 0 0 3 0 3 3  Down, Depressed, Hopeless 0 0 0 0 0 0  PHQ - 2 Score 0 0 3 0 3 3  Altered sleeping - - - - - 1  Tired, decreased energy - - - - - 3  Change in appetite - - - - - 0  Feeling bad or failure about yourself  - - - - - 0  Trouble concentrating - - - - - 2  Moving slowly or fidgety/restless - - - - - 0  Suicidal thoughts - - - - - 0  PHQ-9 Score - - - - - 9     Review of Systems  Constitutional: Negative.   HENT: Negative.   Eyes: Negative.   Respiratory: Negative.   Cardiovascular: Negative.   Gastrointestinal: Negative.   Endocrine: Negative.   Genitourinary: Negative.   Musculoskeletal: Positive for arthralgias, back  pain, myalgias and neck pain.  Skin: Negative.   Allergic/Immunologic: Negative.   Neurological: Negative.   Hematological: Negative.   Psychiatric/Behavioral: Negative.   All other systems reviewed and are negative.      Objective:   Physical Exam  Constitutional: She is oriented to person, place, and time. She appears well-developed and well-nourished. No distress.  HENT:  Head: Normocephalic and atraumatic.  Neurological: She is alert and oriented to person, place, and time.  Skin: She is not diaphoretic.  Nursing note and vitals reviewed.  Patient has mild tenderness over the right acromioclavicular area No tenderness in the subacromial area Positive impingement testing on the right side Motor strength is 5/5 lateral deltoid bicep tricep grip 5/5 in the right hip flexor knee extensor ankle dorsiflexor as well as left hip flexor knee extensor ankle dorsiflexor Patient has tenderness  to palpation cervical paraspinals going down to upper thoracic area.  No tenderness in the mid to lower thoracic area or in the lumbar area no tenderness of the hips. Cervical range of motion 75% rotation to the right and 50% to the left 75% cervical extension flexion and lateral bending.  Shoulder range of motion full internal/external rotation but does have some pain at end range in the right side     Assessment & Plan:  #1.  Cervical stenosis  with chronic pain I encouraged performing at least an aerobic exercise program on a regular basis.  This could be as simple as walking.  She states that she will try this, she has been busy visiting her mother who has been sick up in [place]. 2.  History of chronic right shoulder pain with prior right open rotator cuff repair.  She is maintaining her range of motion although she does have pain at end range  Indication for chronic opioid: See above Medication and dose: Tylenol #4  # pills per month: 30 Last UDS date: 09/24/2017 Pain contract signed (Y/N): y Date narcotic database last reviewed (include red flags): 09/24/17  Return to clinic in 3 months with nurse practitioner

## 2017-09-30 ENCOUNTER — Ambulatory Visit (INDEPENDENT_AMBULATORY_CARE_PROVIDER_SITE_OTHER): Payer: PPO | Admitting: Neurology

## 2017-09-30 DIAGNOSIS — G479 Sleep disorder, unspecified: Secondary | ICD-10-CM

## 2017-09-30 DIAGNOSIS — G4710 Hypersomnia, unspecified: Secondary | ICD-10-CM

## 2017-09-30 DIAGNOSIS — R351 Nocturia: Secondary | ICD-10-CM

## 2017-09-30 DIAGNOSIS — R0683 Snoring: Secondary | ICD-10-CM

## 2017-09-30 DIAGNOSIS — Z820 Family history of epilepsy and other diseases of the nervous system: Secondary | ICD-10-CM

## 2017-09-30 DIAGNOSIS — E669 Obesity, unspecified: Secondary | ICD-10-CM

## 2017-09-30 DIAGNOSIS — R519 Headache, unspecified: Secondary | ICD-10-CM

## 2017-09-30 DIAGNOSIS — R51 Headache: Secondary | ICD-10-CM

## 2017-09-30 DIAGNOSIS — G478 Other sleep disorders: Secondary | ICD-10-CM

## 2017-09-30 DIAGNOSIS — F1190 Opioid use, unspecified, uncomplicated: Secondary | ICD-10-CM

## 2017-09-30 DIAGNOSIS — G4719 Other hypersomnia: Secondary | ICD-10-CM

## 2017-09-30 LAB — DRUG TOX MONITOR 1 W/CONF, ORAL FLD
AMPHETAMINES: NEGATIVE ng/mL (ref <10)
BARBITURATES: NEGATIVE ng/mL (ref <10)
Benzodiazepines: NEGATIVE ng/mL (ref <0.50)
Buprenorphine: NEGATIVE ng/mL (ref <0.10)
COCAINE: NEGATIVE ng/mL (ref <5.0)
CODEINE: 15.8 ng/mL — AB (ref <2.5)
Cotinine: >250 ng/mL — ABNORMAL HIGH (ref <5.0)
DIHYDROCODEINE: NEGATIVE ng/mL (ref <2.5)
FENTANYL: NEGATIVE ng/mL (ref <0.10)
Heroin Metabolite: NEGATIVE ng/mL (ref <1.0)
Hydrocodone: NEGATIVE ng/mL (ref <2.5)
Hydromorphone: NEGATIVE ng/mL (ref <2.5)
MARIJUANA: NEGATIVE ng/mL (ref <2.5)
MDMA: NEGATIVE ng/mL (ref <10)
Meprobamate: NEGATIVE ng/mL (ref <2.5)
Methadone: NEGATIVE ng/mL (ref <5.0)
Morphine: NEGATIVE ng/mL (ref <2.5)
NICOTINE METABOLITE: POSITIVE ng/mL — AB (ref <5.0)
Norhydrocodone: NEGATIVE ng/mL (ref <2.5)
Noroxycodone: NEGATIVE ng/mL (ref <2.5)
OXYCODONE: NEGATIVE ng/mL (ref <2.5)
OXYMORPHONE: NEGATIVE ng/mL (ref <2.5)
Opiates: POSITIVE ng/mL — AB (ref <2.5)
PHENCYCLIDINE: NEGATIVE ng/mL (ref <10)
TRAMADOL: NEGATIVE ng/mL (ref <5.0)
Tapentadol: NEGATIVE ng/mL (ref <5.0)
Zolpidem: NEGATIVE ng/mL (ref <5.0)

## 2017-09-30 LAB — DRUG TOX ALC METAB W/CON, ORAL FLD: Alcohol Metabolite: NEGATIVE ng/mL (ref <25)

## 2017-10-03 ENCOUNTER — Telehealth: Payer: Self-pay

## 2017-10-03 ENCOUNTER — Other Ambulatory Visit: Payer: Self-pay | Admitting: Neurology

## 2017-10-03 ENCOUNTER — Encounter: Payer: Self-pay | Admitting: Family Medicine

## 2017-10-03 DIAGNOSIS — R350 Frequency of micturition: Secondary | ICD-10-CM

## 2017-10-03 NOTE — Addendum Note (Signed)
Addended by: Lamar Blinks C on: 10/03/2017 05:16 PM   Modules accepted: Orders

## 2017-10-03 NOTE — Progress Notes (Signed)
Patient referred by Dr. Lorelei Pont, seen by me on 07/31/17, diagnostic PSG on 09/30/17.   Please call and notify the patient that the recent sleep study did not show any significant obstructive sleep apnea with the exception of intermittent snoring and evidence of sleep apnea in the supine sleep position. For these 2 issues, CPAP treatment is not warranted and losing weight along with avoiding to sleep on the back may suffice as treatment. For disturbing snoring, an oral appliance (through a qualified dentist) can be considered. She did take 3 bathroom breaks. If this is a consistent issue, she may benefit from seeing a urologist. Please inform patient that she can FU with PCP.  Please remind patient to try to maintain good sleep hygiene, which means: Keep a regular sleep and wake schedule and make enough time for sleep (7 1/2 to 8 1/2 hours for the average adult), try not to exercise or have a meal within 2 hours of your bedtime, try to keep your bedroom conducive for sleep, that is, cool and dark, without light distractors such as an illuminated alarm clock, and refrain from watching TV right before sleep or in the middle of the night and do not keep the TV or radio on during the night. If a nightlight is used, have it away from the visual field. Also, try not to use or play on electronic devices at bedtime, such as your cell phone, tablet PC or laptop. If you like to read at bedtime on an electronic device, try to dim the background light as much as possible. Do not eat in the middle of the night. Keep pets away from the bedroom environment. For stress relief, try meditation, deep breathing exercises (there are many books and CDs available), a white noise machine or fan can help to diffuse other noise distractors, such as traffic noise. Do not drink alcohol before bedtime, as it can disturb sleep and cause middle of the night awakenings. Never mix alcohol and sedating medications! Avoid narcotic pain medication  close to bedtime, as opioids/narcotics can suppress breathing drive and breathing effort.    Thanks,  Star Age, MD, PhD [facility])

## 2017-10-03 NOTE — Procedures (Signed)
PATIENT'S NAME:  Susan Cortez, Susan Cortez DOB:      May 16, 1961      MR#:    [id number]     DATE OF RECORDING: 09/30/2017 REFERRING M.D.:  Lamar Blinks MD Study Performed:   Baseline Polysomnogram HISTORY: 57 year old woman with a history of allergies, arthritis, neck pain with cervical spinal stenosis, nonspecific leukocytosis, low back pain, smoking, and obesity, who reports snoring and excessive daytime somnolence. Her Epworth sleepiness score is 12 out of 24. The patient's weight 221 pounds with a height of 68 (inches), resulting in a BMI of 33.4 kg/m2. The patient's neck circumference measured 16.5 inches.  CURRENT MEDICATIONS: Tylenol #4, Volaren Gel, Miralax   PROCEDURE:  This is a multichannel digital polysomnogram utilizing the Somnostar 11.2 system.  Electrodes and sensors were applied and monitored per AASM Specifications.   EEG, EOG, Chin and Limb EMG, were sampled at 200 Hz.  ECG, Snore and Nasal Pressure, Thermal Airflow, Respiratory Effort, CPAP Flow and Pressure, Oximetry was sampled at 50 Hz. Digital video and audio were recorded.      BASELINE STUDY  Lights Out was at 22:41 and Lights On at 05:13.  Total recording time (TRT) was 392.5 minutes, with a total sleep time (TST) of  314.5 minutes.   The patient's sleep latency was 4.5 minutes. REM latency was 167 minutes, which is delayed. The sleep efficiency was 80.1 %.     SLEEP ARCHITECTURE: WASO (Wake after sleep onset) was 73.5 minutes with mild to moderate sleep fragmentation noted. There were 27.5 minutes in Stage N1, 171 minutes Stage N2, 54 minutes Stage N3 and 62 minutes in Stage REM.  The percentage of Stage N1 was 8.7%, Stage N2 was 54.4%, which is normal, Stage N3 was 17.2%, which is normal, and Stage R (REM sleep) was 19.7%, which is normal. The arousals were noted as: 31 were spontaneous, 0 were associated with PLMs, 0 were associated with respiratory events.  Audio and video analysis did not show any abnormal or unusual movements,  behaviors, phonations or vocalizations. The patient took 3 bathroom breaks. Mild intermittent snoring was noted. The EKG was in keeping with normal sinus rhythm (NSR).  RESPIRATORY ANALYSIS:  There were a total of 8 respiratory events:  2 obstructive apneas, 0 central apneas and 0 mixed apneas with a total of 2 apneas and an apnea index (AI) of .4 /hour. There were 6 hypopneas with a hypopnea index of 1.1 /hour. The patient also had 0 respiratory event related arousals (RERAs).      The total APNEA/HYPOPNEA INDEX (AHI) was 1.5/hour and the total RESPIRATORY DISTURBANCE INDEX was 1.5 /hour.  1 events occurred in REM sleep and 10 events in NREM. The REM AHI was 1. /hour, versus a non-REM AHI of 1.7. The patient spent 14 minutes of total sleep time in the supine position and 301 minutes in non-supine.. The supine AHI was 30.0 versus a non-supine AHI of 0.2.  OXYGEN SATURATION & C02:  The Wake baseline 02 saturation was 93%, with the lowest being 89%. Time spent below 89% saturation equaled 0 minutes.  PERIODIC LIMB MOVEMENTS: The patient had a total of 0 Periodic Limb Movements.  The Periodic Limb Movement (PLM) index was 0 and the PLM Arousal index was 0/hour.  Post-study, the patient indicated that sleep was the same as usual.   IMPRESSION:  1. Primary Snoring 2. Nocturia 3. Repetitive Intrusions of Sleep  RECOMMENDATIONS:  1. This study does not demonstrate any significant obstructive or central sleep disordered  breathing with the exception of intermittent snoring and evidence of sleep apnea in the supine sleep position. For these 2 issues, CPAP treatment is not warranted and losing weight along with avoiding to sleep on the back may suffice as treatment. For disturbing snoring, an oral appliance (through a qualified dentist) can be considered. This study does not support an intrinsic sleep disorder as a cause of the patient's symptoms. Other causes, including circadian rhythm disturbances, an  underlying mood disorder, medication effect and/or an underlying medical problem cannot be ruled out. 2. This study shows some sleep fragmentation and nocturia; these are nonspecific findings and per se do not signify an intrinsic sleep disorder or a cause for the patient's sleep-related symptoms. Causes include (but are not limited to) the first night effect of the sleep study, circadian rhythm disturbances, medication effect or an underlying mood disorder or medical problem.  3. The patient should be cautioned not to drive, work at heights, or operate dangerous or heavy equipment when tired or sleepy. Review and reiteration of good sleep hygiene measures should be pursued with any patient. 4. The patient can follow-up with her referring provider, who will be notified of the test results.  I certify that I have reviewed the entire raw data recording prior to the issuance of this report in accordance with the Standards of Accreditation of the American Academy of Sleep Medicine (AASM)   Star Age, MD, PhD Diplomat, American Board of Psychiatry and Neurology (Neurology and Sleep Medicine)

## 2017-10-03 NOTE — Telephone Encounter (Signed)
-----   Message from Star Age, MD sent at 10/03/2017  8:19 AM EST ----- Patient referred by Dr. Lorelei Pont, seen by me on 07/31/17, diagnostic PSG on 09/30/17.   Please call and notify the patient that the recent sleep study did not show any significant obstructive sleep apnea with the exception of intermittent snoring and evidence of sleep apnea in the supine sleep position. For these 2 issues, CPAP treatment is not warranted and losing weight along with avoiding to sleep on the back may suffice as treatment. For disturbing snoring, an oral appliance (through a qualified dentist) can be considered. She did take 3 bathroom breaks. If this is a consistent issue, she may benefit from seeing a urologist. Please inform patient that she can FU with PCP.  Please remind patient to try to maintain good sleep hygiene, which means: Keep a regular sleep and wake schedule and make enough time for sleep (7 1/2 to 8 1/2 hours for the average adult), try not to exercise or have a meal within 2 hours of your bedtime, try to keep your bedroom conducive for sleep, that is, cool and dark, without light distractors such as an illuminated alarm clock, and refrain from watching TV right before sleep or in the middle of the night and do not keep the TV or radio on during the night. If a nightlight is used, have it away from the visual field. Also, try not to use or play on electronic devices at bedtime, such as your cell phone, tablet PC or laptop. If you like to read at bedtime on an electronic device, try to dim the background light as much as possible. Do not eat in the middle of the night. Keep pets away from the bedroom environment. For stress relief, try meditation, deep breathing exercises (there are many books and CDs available), a white noise machine or fan can help to diffuse other noise distractors, such as traffic noise. Do not drink alcohol before bedtime, as it can disturb sleep and cause middle of the night awakenings. Never  mix alcohol and sedating medications! Avoid narcotic pain medication close to bedtime, as opioids/narcotics can suppress breathing drive and breathing effort.    Thanks,  Star Age, MD, PhD [facility])

## 2017-10-03 NOTE — Telephone Encounter (Signed)
I called pt. I advised pt that Dr. Rexene Alberts reviewed pt's sleep study and found that pt did not show any significant osa with the exception of intermittent snoring and evidence of sleep apnea in the supine sleep position CPAP therapy is not warrated. Dr. Rexene Alberts recommends that pt speak to a qualified dentist for disturbing snoring, for consideration of an oral appliance. Pt also reports that she frequently needs bathroom breaks during the night, so she will speak with her PCP regarding a urology referral. I reviewed sleep hygiene recommendations with the pt, including trying to keep a regular sleep wake schedule, avoiding electronics in the bedroom, keeping the bedroom cool, dark, and quiet, and avoiding eating or exercising within 2 hours of bedtime as well as eating in the middle of the night. I advised pt to keep pets out of the bedroom. I discussed with pt the importance of stress relief and to try meditation, deep breathing exercises, and/or a white noise machine or fan to diffuse other noise distracters. I advised pt to not drink alcohol before bedtime and to never mix alcohol and sedating medications. Pt was advised to avoid narcotic pain medication close to bedtime. I advised pt that a copy of these sleep study results will be sent to Dr. Lorelei Pont. Pt verbalized understanding of results. Pt had no questions at this time but was encouraged to call back if questions arise.

## 2017-10-25 DIAGNOSIS — D229 Melanocytic nevi, unspecified: Secondary | ICD-10-CM | POA: Diagnosis not present

## 2017-10-25 DIAGNOSIS — L814 Other melanin hyperpigmentation: Secondary | ICD-10-CM | POA: Diagnosis not present

## 2017-10-25 DIAGNOSIS — L821 Other seborrheic keratosis: Secondary | ICD-10-CM | POA: Diagnosis not present

## 2017-10-25 DIAGNOSIS — Z85828 Personal history of other malignant neoplasm of skin: Secondary | ICD-10-CM | POA: Diagnosis not present

## 2017-10-25 DIAGNOSIS — D485 Neoplasm of uncertain behavior of skin: Secondary | ICD-10-CM | POA: Diagnosis not present

## 2017-10-25 DIAGNOSIS — L720 Epidermal cyst: Secondary | ICD-10-CM | POA: Diagnosis not present

## 2017-11-04 DIAGNOSIS — N3941 Urge incontinence: Secondary | ICD-10-CM | POA: Diagnosis not present

## 2017-11-04 DIAGNOSIS — R350 Frequency of micturition: Secondary | ICD-10-CM | POA: Diagnosis not present

## 2017-11-07 ENCOUNTER — Encounter: Payer: Self-pay | Admitting: Family Medicine

## 2017-11-07 DIAGNOSIS — Z1231 Encounter for screening mammogram for malignant neoplasm of breast: Secondary | ICD-10-CM

## 2017-11-13 ENCOUNTER — Other Ambulatory Visit: Payer: Self-pay | Admitting: Plastic Surgery

## 2017-11-13 DIAGNOSIS — D485 Neoplasm of uncertain behavior of skin: Secondary | ICD-10-CM | POA: Diagnosis not present

## 2017-11-13 DIAGNOSIS — D481 Neoplasm of uncertain behavior of connective and other soft tissue: Secondary | ICD-10-CM | POA: Diagnosis not present

## 2017-11-13 DIAGNOSIS — L720 Epidermal cyst: Secondary | ICD-10-CM | POA: Diagnosis not present

## 2017-11-13 DIAGNOSIS — L308 Other specified dermatitis: Secondary | ICD-10-CM | POA: Diagnosis not present

## 2017-11-15 DIAGNOSIS — D126 Benign neoplasm of colon, unspecified: Secondary | ICD-10-CM | POA: Diagnosis not present

## 2017-11-15 DIAGNOSIS — Z1211 Encounter for screening for malignant neoplasm of colon: Secondary | ICD-10-CM | POA: Diagnosis not present

## 2017-11-15 DIAGNOSIS — K648 Other hemorrhoids: Secondary | ICD-10-CM | POA: Diagnosis not present

## 2017-11-15 DIAGNOSIS — D123 Benign neoplasm of transverse colon: Secondary | ICD-10-CM | POA: Diagnosis not present

## 2017-11-15 DIAGNOSIS — Z86010 Personal history of colonic polyps: Secondary | ICD-10-CM | POA: Diagnosis not present

## 2017-11-18 ENCOUNTER — Ambulatory Visit (HOSPITAL_BASED_OUTPATIENT_CLINIC_OR_DEPARTMENT_OTHER)
Admission: RE | Admit: 2017-11-18 | Discharge: 2017-11-18 | Disposition: A | Discharge status: Home / Self Care | Payer: PPO | Source: Ambulatory Visit | Attending: Family Medicine | Admitting: Family Medicine

## 2017-11-18 DIAGNOSIS — Z1231 Encounter for screening mammogram for malignant neoplasm of breast: Secondary | ICD-10-CM | POA: Diagnosis not present

## 2017-11-20 DIAGNOSIS — D485 Neoplasm of uncertain behavior of skin: Secondary | ICD-10-CM | POA: Diagnosis not present

## 2017-12-23 ENCOUNTER — Ambulatory Visit: Payer: PPO | Admitting: Registered Nurse

## 2017-12-30 ENCOUNTER — Ambulatory Visit: Payer: PPO | Admitting: Registered Nurse

## 2018-01-02 ENCOUNTER — Ambulatory Visit: Payer: PPO | Admitting: Registered Nurse

## 2018-01-03 ENCOUNTER — Encounter: Payer: Self-pay | Admitting: Registered Nurse

## 2018-01-03 ENCOUNTER — Other Ambulatory Visit: Payer: Self-pay

## 2018-01-03 ENCOUNTER — Encounter: Payer: PPO | Attending: Registered Nurse | Admitting: Registered Nurse

## 2018-01-03 VITALS — BP 123/75 | HR 85 | Ht 68.5 in | Wt 215.2 lb

## 2018-01-03 DIAGNOSIS — G4700 Insomnia, unspecified: Secondary | ICD-10-CM | POA: Insufficient documentation

## 2018-01-03 DIAGNOSIS — M542 Cervicalgia: Secondary | ICD-10-CM | POA: Insufficient documentation

## 2018-01-03 DIAGNOSIS — Z79899 Other long term (current) drug therapy: Secondary | ICD-10-CM | POA: Diagnosis not present

## 2018-01-03 DIAGNOSIS — Z833 Family history of diabetes mellitus: Secondary | ICD-10-CM | POA: Diagnosis not present

## 2018-01-03 DIAGNOSIS — Z8249 Family history of ischemic heart disease and other diseases of the circulatory system: Secondary | ICD-10-CM | POA: Insufficient documentation

## 2018-01-03 DIAGNOSIS — Z5181 Encounter for therapeutic drug level monitoring: Secondary | ICD-10-CM

## 2018-01-03 DIAGNOSIS — M4802 Spinal stenosis, cervical region: Secondary | ICD-10-CM

## 2018-01-03 DIAGNOSIS — G8929 Other chronic pain: Secondary | ICD-10-CM | POA: Diagnosis not present

## 2018-01-03 DIAGNOSIS — M1990 Unspecified osteoarthritis, unspecified site: Secondary | ICD-10-CM | POA: Insufficient documentation

## 2018-01-03 DIAGNOSIS — G894 Chronic pain syndrome: Secondary | ICD-10-CM

## 2018-01-03 DIAGNOSIS — M25511 Pain in right shoulder: Secondary | ICD-10-CM

## 2018-01-03 DIAGNOSIS — F17210 Nicotine dependence, cigarettes, uncomplicated: Secondary | ICD-10-CM | POA: Diagnosis not present

## 2018-01-03 DIAGNOSIS — M545 Low back pain: Secondary | ICD-10-CM | POA: Insufficient documentation

## 2018-01-03 MED ORDER — ACETAMINOPHEN-CODEINE #4 300-60 MG PO TABS: 1.0000 | ORAL_TABLET | Freq: Every day | ORAL | 2 refills | Status: DC

## 2018-01-03 NOTE — Progress Notes (Signed)
Subjective:    Patient ID: Susan Cortez, female    DOB: 1961-08-27, 57 y.o.   MRN: [id number]  HPI: [name] is a 57 year old female who returns for follow up appointment for chronic pain and medication refill. She states her pain is located in her neck and right shoulder. She rates her pain 7. Her current exercise regime is walking.  [name] Morphine Equivalent is 8.10 MME.  Last UDS was Performed on 12/31/2016.  Pain Inventory Average Pain 6 Pain Right Now 7 My pain is sharp, burning, stabbing and tingling  In the last 24 hours, has pain interfered with the following? General activity 2 Relation with others 2 Enjoyment of life 2 What TIME of day is your pain at its worst? daytime evening Sleep (in general) Poor  Pain is worse with: walking, sitting, standing and some activites Pain improves with: rest, heat/ice, pacing activities and medication Relief from Meds: 7  Mobility walk without assistance how many minutes can you walk? 30 ability to climb steps?  yes do you drive?  yes Do you have any goals in this area?  yes  Function Do you have any goals in this area?  no  Neuro/Psych No problems in this area  Prior Studies Any changes since last visit?  no  Physicians involved in your care Any changes since last visit?  no   Family History  Problem Relation Age of Onset  . Diabetes Father   . Heart disease Father   . Hyperlipidemia Father    Social History   Socioeconomic History  . Marital status: Divorced    Spouse name: Not on file  . Number of children: Not on file  . Years of education: Not on file  . Highest education level: Not on file  Occupational History  . Not on file  Social Needs  . Financial resource strain: Not on file  . Food insecurity:    Worry: Not on file    Inability: Not on file  . Transportation needs:    Medical: Not on file    Non-medical: Not on file  Tobacco Use  . Smoking status: Current Every Day Smoker      Packs/day: 1.00    Years: 40.00    Pack years: 40.00  . Smokeless tobacco: Never Used  Substance and Sexual Activity  . Alcohol use: No  . Drug use: No  . Sexual activity: Never  Lifestyle  . Physical activity:    Days per week: Not on file    Minutes per session: Not on file  . Stress: Not on file  Relationships  . Social connections:    Talks on phone: Not on file    Gets together: Not on file    Attends religious service: Not on file    Active member of club or organization: Not on file    Attends meetings of clubs or organizations: Not on file    Relationship status: Not on file  Other Topics Concern  . Not on file  Social History Narrative  . Not on file   Past Surgical History:  Procedure Laterality Date  . CESAREAN SECTION    . SHOULDER ARTHROSCOPY WITH OPEN ROTATOR CUFF REPAIR  05/2009  . TONSILLECTOMY  1977  . TUBAL LIGATION     Past Medical History:  Diagnosis Date  . Allergy   . Arthritis    BP 123/75   Pulse 85   Ht 5' 8.5" (1.74 m)  Wt 215 lb 3.2 oz (97.6 kg)   SpO2 96%   BMI 32.25 kg/m   Opioid Risk Score:   Fall Risk Score:  `1  Depression screen PHQ 2/9  Depression screen [facility] 2/9 01/03/2018 06/26/2017 09/05/2016 04/03/2016 12/26/2015 05/05/2015 11/22/2014  Decreased Interest 0 0 0 3 0 3 3  Down, Depressed, Hopeless 0 0 0 0 0 0 0  PHQ - 2 Score 0 0 0 3 0 3 3  Altered sleeping - - - - - - 1  Tired, decreased energy - - - - - - 3  Change in appetite - - - - - - 0  Feeling bad or failure about yourself  - - - - - - 0  Trouble concentrating - - - - - - 2  Moving slowly or fidgety/restless - - - - - - 0  Suicidal thoughts - - - - - - 0  PHQ-9 Score - - - - - - 9    Review of Systems  Constitutional: Negative.   HENT: Negative.   Eyes: Negative.   Respiratory: Negative.   Cardiovascular: Negative.   Gastrointestinal: Negative.   Endocrine: Negative.   Genitourinary: Negative.   Musculoskeletal: Negative.   Skin: Negative.    Allergic/Immunologic: Negative.   Neurological: Negative.   Hematological: Negative.   Psychiatric/Behavioral: Negative.   All other systems reviewed and are negative.      Objective:   Physical Exam        Assessment & Plan:  1.Cervical spinal stenosis without evidence ofradiculopathy. Chronic neck pain: Refilled Tylenol #4 300/60mg  daily HS.#30.01/03/2018 2. Right shoulder rotator cuff tear, status post repair with chronic pain and contracture : Continue with exercise regime as tolerated .01/03/2018 3. Insomnia: Continue Melatonin. 01/03/2018 4. Chronic Bilateral Lower Back Pain without Sciatica: Continue HEP as tolerated. Continue to Monitor. 01/03/2018   15 minutes of face to face patient care time was spent during this visit. All questions were encouraged and answered.   F/U 3 months

## 2018-01-06 DIAGNOSIS — R350 Frequency of micturition: Secondary | ICD-10-CM | POA: Diagnosis not present

## 2018-01-07 NOTE — Progress Notes (Addendum)
[place] at [facility] [address], [place], [place]  [address] [phone]  Date:  01/08/2018   Name:  Susan Cortez   DOB:  05/06/1961   MRN:  [id number]  PCP:  Susan Mclean, MD    Chief Complaint: Annual Exam (ringing in ears-getting better, lump on right side of neck several months )   History of Present Illness:  Susan Cortez is a 57 y.o. very pleasant female patient who presents with the following:  Here today for a CPE Last seen by myself about 6 months ago:  She developed sleep issues with Wellbutrin and stopped using it. She could not afford phentermine either so has not been able to take it either. Her weight is stable. She has been occupied by some health issues in her family so smoking cessation/ weight loss are on the back burning for now She is still smoking 1 PPD but continues to want to quit Negative pap last year- will not do today She is fasting today for labs  Last mamo this year - February She is on a 3 year plan for her colonoscopy she had polyps on her last scope; will be due in 2019 Shetook [facility] ina [facility]- they did a cardiac MRI which was normal, she brings a copy of this with her today. She occasionally will participate in medical studies as she hopes to help advance medical knowledge She has noted a mole on her right jaw for the last 4-5 months; no history of skin cancer. She is a bit concerned about this mole She is also seen at PM&R to manage her chronic pain.  Form most recent note: 1.Cervical spinal stenosis without evidence ofradiculopathy. Chronic neck pain: Refilled Tylenol #4 300/60mg  daily HS.#30.06/26/2017 2. Right shoulder rotator cuff tear, status post repair with chronic pain and contracture : Continue with exercise regimeas tolerated.06/26/2017 3. Insomnia:Continue Melatonin. 06/26/2017 4. Chronic Bilateral Lower Back Pain without Sciatica: Continue HEP  as tolerated. Continue to Monitor. 06/26/2017 We did find a skin cancer on her earlier this year- she followed up with derm, they did some further resection and they will see her in January   RecentLabs  Lab Results  Component Value Date   HGBA1C 5.9 12/27/2016     She does have pre-diabetes She is still smoking- she is planning to try and quit in the near future.   She is due for a colonoscopy in April- she has a 3 year follow-up.  She would like to see [facility] this time as she was not as happy with her last provider  She has been told that her white blood cell count was "slightly high" for quite some time- she is not sure how long- but no further evaluation done so far   Mammo: 3/19 Pap: 5/17- negative, negative HPV  Colon: 3/19 Dexa: non yet Immun: ok Labs: due- she is fasting today   PM&R note from this month: 1.Cervical spinal stenosis without evidence ofradiculopathy. Chronic neck pain: Refilled Tylenol #4 300/60mg  daily HS.#30.01/03/2018 2. Right shoulder rotator cuff tear, status post repair with chronic pain and contracture : Continue with exercise regimeas tolerated.01/03/2018 3. Insomnia:Continue Melatonin. 01/03/2018 4. Chronic Bilateral Lower Back Pain without Sciatica: Continue HEP as tolerated. Continue to Monitor. 01/03/2018  She had a recent sleep eval and was found to NOT have significant OSA, but they did recommend a dental appliance for her snoring She started Myrbetriq per  Susan Cortez-  This is helping her   She has noted some ringing in her left ear for the last 3-4 months.  Has this in the past, it went away and then came back.  It seems to be worse at night It seems to be getting better however over the last couple of weeks.  She does not want to see ENT right now  Hearing seems ok, no pain in her ear  Also she has noted a lump in her right posterior neck for about 2 months, not tender. This is the same area where she had skin cancer in the past.     She is interested in quitting smoking but has tried chantix and wellbutrin with no success so far  Discussed using gum, patches- she will continue to work on this She has been out of her cholesterol med for a year- we will check on her level today and restart if needed   Her mom has been sick- a fib and acute onset of CHF She does see dermatology for skin checks   Patient Active Problem List   Diagnosis Date Noted  . De Quervain's disease (tenosynovitis) 03/28/2017  . Chronic bilateral low back pain without sciatica 03/28/2017  . Sebaceous hyperplasia of face 06/26/2016  . Chronic right shoulder pain 01/02/2016  . Adenoma of left adrenal gland 12/26/2015  . Intermittent claudication ([place]) 12/26/2015  . Rotator cuff (capsule) sprain 03/26/2014  . Spinal stenosis in cervical region 03/26/2014  . Chronic pain 03/26/2014    Past Medical History:  Diagnosis Date  . Allergy   . Arthritis     Past Surgical History:  Procedure Laterality Date  . CESAREAN SECTION    . SHOULDER ARTHROSCOPY WITH OPEN ROTATOR CUFF REPAIR  05/2009  . TONSILLECTOMY  1977  . TUBAL LIGATION      Social History   Tobacco Use  . Smoking status: Current Every Day Smoker    Packs/day: 1.00    Years: 40.00    Pack years: 40.00  . Smokeless tobacco: Never Used  Substance Use Topics  . Alcohol use: No  . Drug use: No    Family History  Problem Relation Age of Onset  . Diabetes Father   . Heart disease Father   . Hyperlipidemia Father     Allergies  Allergen Reactions  . Chantix [Varenicline] Other (See Comments)    Strange dreams/hallucinations  . Diclofenac Swelling  . Meloxicam Swelling  . Butrans [Buprenorphine] Itching    Itching and redness at the area of the patch.  Recardo Evangelist [Pregabalin]     Headache,severe eye pain, swelling  . Nsaids     Stomach pain  . Tolmetin Other (See Comments)    Stomach pain    Medication list has been reviewed and updated.  Current Outpatient  Medications on File Prior to Visit  Medication Sig Dispense Refill  . acetaminophen-codeine (TYLENOL #4) 300-60 MG tablet Take 1 tablet by mouth daily. 30 tablet 2  . diclofenac sodium (VOLTAREN) 1 % GEL Apply 2 g topically 4 (four) times daily. 3 Tube 1  . mirabegron ER (MYRBETRIQ) 50 MG TB24 tablet Take by mouth.    . polyethylene glycol powder (GLYCOLAX/MIRALAX) powder Take 17 g 2 (two) times daily as needed by mouth. Use for constipation 3350 g 6   No current facility-administered medications on file prior to visit.     Review of Systems:  As per HPI- otherwise negative. No CP or SOB No PMB No  skin or breast concerns    Physical Examination: Vitals:   01/08/18 0920  BP: 132/80  Pulse: 78  Resp: 16  SpO2: 98%   Vitals:   01/08/18 0920  Weight: 215 lb (97.5 kg)  Height: 5' 8.5" (1.74 m)   Body mass index is 32.22 kg/m. Ideal Body Weight: Weight in (lb) to have BMI = 25: 166.5  GEN: WDWN, NAD, Non-toxic, A & O x 3, obese, looks well  HEENT: Atraumatic, Normocephalic. Neck supple. No masses, No LAD.  Bilateral TM wnl, oropharynx normal.  PEERL,EOMI.   Right neck:  There is an approx 4cm by 1cm firm, linear mass in her right mid neck.  ?cyst Ears and Nose: No external deformity. CV: RRR, No M/G/R. No JVD. No thrill. No extra heart sounds. PULM: CTA B, no wheezes, crackles, rhonchi. No retractions. No resp. distress. No accessory muscle use. ABD: S, NT, ND EXTR: No c/c/e NEURO Normal gait.  PSYCH: Normally interactive. Conversant. Not depressed or anxious appearing.  Calm demeanor.    Assessment and Plan: Physical exam  Leukocytosis, unspecified type - Plan: CBC  Snoring  Pre-diabetes - Plan: Comprehensive metabolic panel, Hemoglobin A1c  Screening for deficiency anemia - Plan: CBC  Screening for hyperlipidemia - Plan: Lipid panel  Mass of right side of neck - Plan: US Soft Tissue Head/Neck  Tinnitus of left ear  Screening for osteoporosis - Plan: DG Bone  Density  Estrogen deficiency - Plan: DG Bone Density  Encounter for screening for malignant neoplasm of respiratory organs - Plan: CT CHEST LUNG CA SCREEN LOW DOSE W/O CM  Following up today She qualifies for CT scan screening for lung cancer, will order for her today Left ear appears normal, she will let me know if she desires ENT referral  Labs pending as above Pap, mammo, colon UTD Ordered dexa   Signed Lamar Blinks, MD  Received her labs - message to pt Offered a statin Results for orders placed or performed in visit on 01/08/18  CBC  Result Value Ref Range   WBC 9.1 4.0 - 10.5 K/uL   RBC 4.58 3.87 - 5.11 Mil/uL   Platelets 339.0 150.0 - 400.0 K/uL   Hemoglobin 14.3 12.0 - 15.0 g/dL   HCT 42.9 36.0 - 46.0 %   MCV 93.5 78.0 - 100.0 fl   MCHC 33.4 30.0 - 36.0 g/dL   RDW 14.1 11.5 - 15.5 %  Comprehensive metabolic panel  Result Value Ref Range   Sodium 140 135 - 145 mEq/L   Potassium 5.0 3.5 - 5.1 mEq/L   Chloride 106 96 - 112 mEq/L   CO2 26 19 - 32 mEq/L   Glucose, Bld 98 70 - 99 mg/dL   BUN 7 6 - 23 mg/dL   Creatinine, Ser 0.59 0.40 - 1.20 mg/dL   Total Bilirubin 0.4 0.2 - 1.2 mg/dL   Alkaline Phosphatase 57 39 - 117 U/L   AST 15 0 - 37 U/L   ALT 16 0 - 35 U/L   Total Protein 6.9 6.0 - 8.3 g/dL   Albumin 4.4 3.5 - 5.2 g/dL   Calcium 9.4 8.4 - 10.5 mg/dL   GFR 111.17 >60.00 mL/min  Hemoglobin A1c  Result Value Ref Range   Hgb A1c MFr Bld 5.7 4.6 - 6.5 %  Lipid panel  Result Value Ref Range   Cholesterol 186 0 - 200 mg/dL   Triglycerides 104.0 0.0 - 149.0 mg/dL   HDL 48.70 >39.00 mg/dL   VLDL 20.8  0.0 - 40.0 mg/dL   LDL Cholesterol 117 (H) 0 - 99 mg/dL   Total CHOL/HDL Ratio 4    NonHDL 137.66

## 2018-01-08 ENCOUNTER — Encounter: Payer: Self-pay | Admitting: Family Medicine

## 2018-01-08 ENCOUNTER — Ambulatory Visit (INDEPENDENT_AMBULATORY_CARE_PROVIDER_SITE_OTHER): Payer: PPO | Admitting: Family Medicine

## 2018-01-08 VITALS — BP 132/80 | HR 78 | Resp 16 | Ht 68.5 in | Wt 215.0 lb

## 2018-01-08 DIAGNOSIS — Z13820 Encounter for screening for osteoporosis: Secondary | ICD-10-CM | POA: Diagnosis not present

## 2018-01-08 DIAGNOSIS — R7303 Prediabetes: Secondary | ICD-10-CM

## 2018-01-08 DIAGNOSIS — Z0000 Encounter for general adult medical examination without abnormal findings: Secondary | ICD-10-CM

## 2018-01-08 DIAGNOSIS — Z122 Encounter for screening for malignant neoplasm of respiratory organs: Secondary | ICD-10-CM

## 2018-01-08 DIAGNOSIS — H9312 Tinnitus, left ear: Secondary | ICD-10-CM | POA: Diagnosis not present

## 2018-01-08 DIAGNOSIS — D72829 Elevated white blood cell count, unspecified: Secondary | ICD-10-CM | POA: Diagnosis not present

## 2018-01-08 DIAGNOSIS — R221 Localized swelling, mass and lump, neck: Secondary | ICD-10-CM

## 2018-01-08 DIAGNOSIS — Z13220 Encounter for screening for lipoid disorders: Secondary | ICD-10-CM | POA: Diagnosis not present

## 2018-01-08 DIAGNOSIS — R0683 Snoring: Secondary | ICD-10-CM

## 2018-01-08 DIAGNOSIS — Z130 Encounter for screening for diseases of the blood and blood-forming organs and certain disorders involving the immune mechanism: Secondary | ICD-10-CM | POA: Diagnosis not present

## 2018-01-08 DIAGNOSIS — E785 Hyperlipidemia, unspecified: Secondary | ICD-10-CM

## 2018-01-08 DIAGNOSIS — E2839 Other primary ovarian failure: Secondary | ICD-10-CM

## 2018-01-08 LAB — COMPREHENSIVE METABOLIC PANEL
ALT: 16 U/L (ref 0–35)
AST: 15 U/L (ref 0–37)
Albumin: 4.4 g/dL (ref 3.5–5.2)
Alkaline Phosphatase: 57 U/L (ref 39–117)
BUN: 7 mg/dL (ref 6–23)
CALCIUM: 9.4 mg/dL (ref 8.4–10.5)
CO2: 26 mEq/L (ref 19–32)
CREATININE: 0.59 mg/dL (ref 0.40–1.20)
Chloride: 106 mEq/L (ref 96–112)
GFR: 111.17 mL/min (ref >60.00)
Glucose, Bld: 98 mg/dL (ref 70–99)
Potassium: 5 mEq/L (ref 3.5–5.1)
Sodium: 140 mEq/L (ref 135–145)
TOTAL PROTEIN: 6.9 g/dL (ref 6.0–8.3)
Total Bilirubin: 0.4 mg/dL (ref 0.2–1.2)

## 2018-01-08 LAB — CBC
HCT: 42.9 % (ref 36.0–46.0)
HEMOGLOBIN: 14.3 g/dL (ref 12.0–15.0)
MCHC: 33.4 g/dL (ref 30.0–36.0)
MCV: 93.5 fl (ref 78.0–100.0)
Platelets: 339 10*3/uL (ref 150.0–400.0)
RBC: 4.58 Mil/uL (ref 3.87–5.11)
RDW: 14.1 % (ref 11.5–15.5)
WBC: 9.1 10*3/uL (ref 4.0–10.5)

## 2018-01-08 LAB — LIPID PANEL
CHOL/HDL RATIO: 4
Cholesterol: 186 mg/dL (ref 0–200)
HDL: 48.7 mg/dL (ref >39.00)
LDL CALC: 117 mg/dL — AB (ref 0–99)
NONHDL: 137.66
Triglycerides: 104 mg/dL (ref 0.0–149.0)
VLDL: 20.8 mg/dL (ref 0.0–40.0)

## 2018-01-08 LAB — HEMOGLOBIN A1C: HEMOGLOBIN A1C: 5.7 % (ref 4.6–6.5)

## 2018-01-08 NOTE — Patient Instructions (Addendum)
Great to see you today-  I will be in touch with your labs asap I do encourage you to try and quit smoking!! We will set you up for a bone density scan and for an ultrasound to look at the area on the right side of your neck   You also do qualify for screening for lung cancer with a low dose CT scan.  I will set this up as well    Health Maintenance for Postmenopausal Women Menopause is a normal process in which your reproductive ability comes to an end. This process happens gradually over a span of months to years, usually between the ages of 58 and 1. Menopause is complete when you have missed 12 consecutive menstrual periods. It is important to talk with your health care provider about some of the most common conditions that affect postmenopausal women, such as heart disease, cancer, and bone loss (osteoporosis). Adopting a healthy lifestyle and getting preventive care can help to promote your health and wellness. Those actions can also lower your chances of developing some of these common conditions. What should I know about menopause? During menopause, you may experience a number of symptoms, such as:  Moderate-to-severe hot flashes.  Night sweats.  Decrease in sex drive.  Mood swings.  Headaches.  Tiredness.  Irritability.  Memory problems.  Insomnia.  Choosing to treat or not to treat menopausal changes is an individual decision that you make with your health care provider. What should I know about hormone replacement therapy and supplements? Hormone therapy products are effective for treating symptoms that are associated with menopause, such as hot flashes and night sweats. Hormone replacement carries certain risks, especially as you become older. If you are thinking about using estrogen or estrogen with progestin treatments, discuss the benefits and risks with your health care provider. What should I know about heart disease and stroke? Heart disease, heart attack, and  stroke become more likely as you age. This may be due, in part, to the hormonal changes that your body experiences during menopause. These can affect how your body processes dietary fats, triglycerides, and cholesterol. Heart attack and stroke are both medical emergencies. There are many things that you can do to help prevent heart disease and stroke:  Have your blood pressure checked at least every 1-2 years. High blood pressure causes heart disease and increases the risk of stroke.  If you are 58-61 years old, ask your health care provider if you should take aspirin to prevent a heart attack or a stroke.  Do not use any tobacco products, including cigarettes, chewing tobacco, or electronic cigarettes. If you need help quitting, ask your health care provider.  It is important to eat a healthy diet and maintain a healthy weight. ? Be sure to include plenty of vegetables, fruits, low-fat dairy products, and lean protein. ? Avoid eating foods that are high in solid fats, added sugars, or salt (sodium).  Get regular exercise. This is one of the most important things that you can do for your health. ? Try to exercise for at least 150 minutes each week. The type of exercise that you do should increase your heart rate and make you sweat. This is known as moderate-intensity exercise. ? Try to do strengthening exercises at least twice each week. Do these in addition to the moderate-intensity exercise.  Know your numbers.Ask your health care provider to check your cholesterol and your blood glucose. Continue to have your blood tested as directed by your  health care provider.  What should I know about cancer screening? There are several types of cancer. Take the following steps to reduce your risk and to catch any cancer development as early as possible. Breast Cancer  Practice breast self-awareness. ? This means understanding how your breasts normally appear and feel. ? It also means doing regular  breast self-exams. Let your health care provider know about any changes, no matter how small.  If you are 58 or older, have a clinician do a breast exam (clinical breast exam or CBE) every year. Depending on your age, family history, and medical history, it may be recommended that you also have a yearly breast X-ray (mammogram).  If you have a family history of breast cancer, talk with your health care provider about genetic screening.  If you are at high risk for breast cancer, talk with your health care provider about having an MRI and a mammogram every year.  Breast cancer (BRCA) gene test is recommended for women who have family members with BRCA-related cancers. Results of the assessment will determine the need for genetic counseling and BRCA1 and for BRCA2 testing. BRCA-related cancers include these types: ? Breast. This occurs in males or females. ? Ovarian. ? Tubal. This may also be called fallopian tube cancer. ? Cancer of the abdominal or pelvic lining (peritoneal cancer). ? Prostate. ? Pancreatic.  Cervical, Uterine, and Ovarian Cancer Your health care provider may recommend that you be screened regularly for cancer of the pelvic organs. These include your ovaries, uterus, and vagina. This screening involves a pelvic exam, which includes checking for microscopic changes to the surface of your cervix (Pap test).  For women ages 58-65, health care providers may recommend a pelvic exam and a Pap test every three years. For women ages 58-65, they may recommend the Pap test and pelvic exam, combined with testing for human papilloma virus (HPV), every five years. Some types of HPV increase your risk of cervical cancer. Testing for HPV may also be done on women of any age who have unclear Pap test results.  Other health care providers may not recommend any screening for nonpregnant women who are considered low risk for pelvic cancer and have no symptoms. Ask your health care provider if a  screening pelvic exam is right for you.  If you have had past treatment for cervical cancer or a condition that could lead to cancer, you need Pap tests and screening for cancer for at least 20 years after your treatment. If Pap tests have been discontinued for you, your risk factors (such as having a new sexual partner) need to be reassessed to determine if you should start having screenings again. Some women have medical problems that increase the chance of getting cervical cancer. In these cases, your health care provider may recommend that you have screening and Pap tests more often.  If you have a family history of uterine cancer or ovarian cancer, talk with your health care provider about genetic screening.  If you have vaginal bleeding after reaching menopause, tell your health care provider.  There are currently no reliable tests available to screen for ovarian cancer.  Lung Cancer Lung cancer screening is recommended for adults 30-73 years old who are at high risk for lung cancer because of a history of smoking. A yearly low-dose CT scan of the lungs is recommended if you:  Currently smoke.  Have a history of at least 30 pack-years of smoking and you currently smoke or have  quit within the past 15 years. A pack-year is smoking an average of one pack of cigarettes per day for one year.  Yearly screening should:  Continue until it has been 15 years since you quit.  Stop if you develop a health problem that would prevent you from having lung cancer treatment.  Colorectal Cancer  This type of cancer can be detected and can often be prevented.  Routine colorectal cancer screening usually begins at age 69 and continues through age 8.  If you have risk factors for colon cancer, your health care provider may recommend that you be screened at an earlier age.  If you have a family history of colorectal cancer, talk with your health care provider about genetic screening.  Your health  care provider may also recommend using home test kits to check for hidden blood in your stool.  A small camera at the end of a tube can be used to examine your colon directly (sigmoidoscopy or colonoscopy). This is done to check for the earliest forms of colorectal cancer.  Direct examination of the colon should be repeated every 5-10 years until age 72. However, if early forms of precancerous polyps or small growths are found or if you have a family history or genetic risk for colorectal cancer, you may need to be screened more often.  Skin Cancer  Check your skin from head to toe regularly.  Monitor any moles. Be sure to tell your health care provider: ? About any new moles or changes in moles, especially if there is a change in a mole's shape or color. ? If you have a mole that is larger than the size of a pencil eraser.  If any of your family members has a history of skin cancer, especially at a young age, talk with your health care provider about genetic screening.  Always use sunscreen. Apply sunscreen liberally and repeatedly throughout the day.  Whenever you are outside, protect yourself by wearing long sleeves, pants, a wide-brimmed hat, and sunglasses.  What should I know about osteoporosis? Osteoporosis is a condition in which bone destruction happens more quickly than new bone creation. After menopause, you may be at an increased risk for osteoporosis. To help prevent osteoporosis or the bone fractures that can happen because of osteoporosis, the following is recommended:  If you are 85-56 years old, get at least 1,000 mg of calcium and at least 600 mg of vitamin D per day.  If you are older than age 49 but younger than age 29, get at least 1,200 mg of calcium and at least 600 mg of vitamin D per day.  If you are older than age 8, get at least 1,200 mg of calcium and at least 800 mg of vitamin D per day.  Smoking and excessive alcohol intake increase the risk of  osteoporosis. Eat foods that are rich in calcium and vitamin D, and do weight-bearing exercises several times each week as directed by your health care provider. What should I know about how menopause affects my mental health? Depression may occur at any age, but it is more common as you become older. Common symptoms of depression include:  Low or sad mood.  Changes in sleep patterns.  Changes in appetite or eating patterns.  Feeling an overall lack of motivation or enjoyment of activities that you previously enjoyed.  Frequent crying spells.  Talk with your health care provider if you think that you are experiencing depression. What should I know about  immunizations? It is important that you get and maintain your immunizations. These include:  Tetanus, diphtheria, and pertussis (Tdap) booster vaccine.  Influenza every year before the flu season begins.  Pneumonia vaccine.  Shingles vaccine.  Your health care provider may also recommend other immunizations. This information is not intended to replace advice given to you by your health care provider. Make sure you discuss any questions you have with your health care provider. Document Released: 10/05/2005 Document Revised: 03/02/2016 Document Reviewed: 05/17/2015 Elsevier Interactive Patient Education  2018 [employer].

## 2018-01-09 MED ORDER — LOVASTATIN 20 MG PO TABS: 20.0000 mg | ORAL_TABLET | Freq: Every day | ORAL | 3 refills | Status: DC

## 2018-01-12 ENCOUNTER — Other Ambulatory Visit: Payer: Self-pay | Admitting: Family Medicine

## 2018-01-12 DIAGNOSIS — E785 Hyperlipidemia, unspecified: Secondary | ICD-10-CM

## 2018-01-15 ENCOUNTER — Ambulatory Visit (HOSPITAL_BASED_OUTPATIENT_CLINIC_OR_DEPARTMENT_OTHER)
Admission: RE | Admit: 2018-01-15 | Discharge: 2018-01-15 | Disposition: A | Discharge status: Home / Self Care | Payer: PPO | Source: Ambulatory Visit | Attending: Family Medicine | Admitting: Family Medicine

## 2018-01-15 ENCOUNTER — Encounter: Payer: Self-pay | Admitting: Family Medicine

## 2018-01-15 ENCOUNTER — Encounter (HOSPITAL_BASED_OUTPATIENT_CLINIC_OR_DEPARTMENT_OTHER): Payer: Self-pay

## 2018-01-15 DIAGNOSIS — J439 Emphysema, unspecified: Secondary | ICD-10-CM | POA: Insufficient documentation

## 2018-01-15 DIAGNOSIS — E2839 Other primary ovarian failure: Secondary | ICD-10-CM | POA: Insufficient documentation

## 2018-01-15 DIAGNOSIS — M8588 Other specified disorders of bone density and structure, other site: Secondary | ICD-10-CM | POA: Diagnosis not present

## 2018-01-15 DIAGNOSIS — D3502 Benign neoplasm of left adrenal gland: Secondary | ICD-10-CM | POA: Diagnosis not present

## 2018-01-15 DIAGNOSIS — Z122 Encounter for screening for malignant neoplasm of respiratory organs: Secondary | ICD-10-CM | POA: Diagnosis not present

## 2018-01-15 DIAGNOSIS — R221 Localized swelling, mass and lump, neck: Secondary | ICD-10-CM | POA: Insufficient documentation

## 2018-01-15 DIAGNOSIS — M85851 Other specified disorders of bone density and structure, right thigh: Secondary | ICD-10-CM | POA: Diagnosis not present

## 2018-01-15 DIAGNOSIS — R918 Other nonspecific abnormal finding of lung field: Secondary | ICD-10-CM | POA: Diagnosis not present

## 2018-01-15 DIAGNOSIS — M8589 Other specified disorders of bone density and structure, multiple sites: Secondary | ICD-10-CM | POA: Diagnosis not present

## 2018-01-15 DIAGNOSIS — F17210 Nicotine dependence, cigarettes, uncomplicated: Secondary | ICD-10-CM | POA: Diagnosis not present

## 2018-01-15 DIAGNOSIS — Z780 Asymptomatic menopausal state: Secondary | ICD-10-CM | POA: Diagnosis not present

## 2018-01-15 DIAGNOSIS — R911 Solitary pulmonary nodule: Secondary | ICD-10-CM | POA: Insufficient documentation

## 2018-01-15 DIAGNOSIS — R590 Localized enlarged lymph nodes: Secondary | ICD-10-CM | POA: Diagnosis not present

## 2018-01-15 DIAGNOSIS — Z13820 Encounter for screening for osteoporosis: Secondary | ICD-10-CM

## 2018-01-15 HISTORY — DX: Basal cell carcinoma of skin, unspecified: C44.91

## 2018-02-05 DIAGNOSIS — D481 Neoplasm of uncertain behavior of connective and other soft tissue: Secondary | ICD-10-CM | POA: Diagnosis not present

## 2018-02-11 ENCOUNTER — Other Ambulatory Visit: Payer: Self-pay | Admitting: Plastic Surgery

## 2018-02-11 DIAGNOSIS — L720 Epidermal cyst: Secondary | ICD-10-CM | POA: Diagnosis not present

## 2018-03-01 DIAGNOSIS — E785 Hyperlipidemia, unspecified: Secondary | ICD-10-CM | POA: Insufficient documentation

## 2018-03-01 HISTORY — DX: Hyperlipidemia, unspecified: E78.5

## 2018-03-01 NOTE — Progress Notes (Signed)
[place] at [facility] [address], [place], [place] [address] [phone]  Date:  03/05/2018   Name:  Susan Cortez   DOB:  06/16/1961   MRN:  [id number]  PCP:  Darreld Mclean, MD    Chief Complaint: Neck Pain (ultrasound-normal, right side still in pain, no improvement ) and Foot Pain (tops of foot pain, left foot worse, no known injury)   History of Present Illness:  Susan Cortez is a 57 y.o. very pleasant female patient who presents with the following:  Here today with concern of neck pain.  I last saw her in May of this year  She does have a known history of spinal stenosis in the spine and is a pt of PM&R for this- however this is a differnet type and location of pain. Per recent PMR note:  1.Cervical spinal stenosis without evidence ofradiculopathy. Chronic neck pain: Refilled Tylenol #4 300/60mg  daily HS.#30.01/03/2018 2. Right shoulder rotator cuff tear, status post repair with chronic pain and contracture : Continue with exercise regimeas tolerated.01/03/2018 3. Insomnia:Continue Melatonin.01/03/2018 4. Chronic Bilateral Lower Back Pain without Sciatica: Continue HEP as tolerated. Continue to Monitor.01/03/2018  I did continue lovastatin at last visit for mild dyslipidemia combined with smoking We did get an [place] for a small neck mass back in may: ULTRASOUND OF HEAD/NECK SOFT TISSUES  TECHNIQUE: Ultrasound examination of the head and neck soft tissues was performed in the area of clinical concern. COMPARISON:  None. FINDINGS: Targeted ultrasound of the right neck in the area of palpable concern demonstrates several small lymph nodes measuring up to 4 mm in short axis. Fatty hila are noted, possibly with mild cortical thickening though without frankly enlarged lymph nodes identified. No other mass is evident.  IMPRESSION: Nonenlarged lymph nodes in the right neck without other  abnormality identified.  At this time she feels like the bump she had noticed is starting to ache.  Not so much tender to touch No heat Does not seem to be getting bigger Not related to moving her neck This is different from her other neck pain which is covered by PM and R with Tylenol 3 No fever or chills, but she does get hot flashes  She also notes that the top of her left foot may hurt on an off- it will be painful and she may limp for a few days and then will resolve. She is not aware of any injury to her foot. She does wear flip- flops a lot of the time. She had an episode of this pain yesterday but is doing better today   Wt Readings from Last 3 Encounters:  03/05/18 211 lb 3.2 oz (95.8 kg)  01/08/18 215 lb (97.5 kg)  01/03/18 215 lb 3.2 oz (97.6 kg)    Patient Active Problem List   Diagnosis Date Noted  . Dyslipidemia 03/01/2018  . De Quervain's disease (tenosynovitis) 03/28/2017  . Chronic bilateral low back pain without sciatica 03/28/2017  . Sebaceous hyperplasia of face 06/26/2016  . Chronic right shoulder pain 01/02/2016  . Adenoma of left adrenal gland 12/26/2015  . Intermittent claudication ([place]) 12/26/2015  . Rotator cuff (capsule) sprain 03/26/2014  . Spinal stenosis in cervical region 03/26/2014  . Chronic pain 03/26/2014    Past Medical History:  Diagnosis Date  . Allergy   . Arthritis   . Basal cell carcinoma 2018   Removed from Right neck per patient  Past Surgical History:  Procedure Laterality Date  . CESAREAN SECTION     X 3  . DILATION AND CURETTAGE OF UTERUS    . SHOULDER ARTHROSCOPY WITH OPEN ROTATOR CUFF REPAIR Right 05/2009  . TONSILLECTOMY  1977  . TUBAL LIGATION      Social History   Tobacco Use  . Smoking status: Current Every Day Smoker    Packs/day: 1.00    Years: 40.00    Pack years: 40.00  . Smokeless tobacco: Never Used  Substance Use Topics  . Alcohol use: No  . Drug use: No    Family History  Problem Relation  Age of Onset  . Diabetes Father   . Heart disease Father   . Hyperlipidemia Father     Allergies  Allergen Reactions  . Chantix [Varenicline] Other (See Comments)    Strange dreams/hallucinations  . Diclofenac Swelling  . Meloxicam Swelling  . Butrans [Buprenorphine] Itching    Itching and redness at the area of the patch.  Recardo Evangelist [Pregabalin]     Headache,severe eye pain, swelling  . Nsaids     Stomach pain  . Tolmetin Other (See Comments)    Stomach pain    Medication list has been reviewed and updated.  Current Outpatient Medications on File Prior to Visit  Medication Sig Dispense Refill  . acetaminophen-codeine (TYLENOL #4) 300-60 MG tablet Take 1 tablet by mouth daily. 30 tablet 2  . diclofenac sodium (VOLTAREN) 1 % GEL Apply 2 g topically 4 (four) times daily. 3 Tube 1  . lovastatin (MEVACOR) 20 MG tablet TAKE 1 TABLET(20 MG) BY MOUTH AT BEDTIME 90 tablet 1  . mirabegron ER (MYRBETRIQ) 50 MG TB24 tablet Take by mouth.    . polyethylene glycol powder (GLYCOLAX/MIRALAX) powder Take 17 g 2 (two) times daily as needed by mouth. Use for constipation 3350 g 6   No current facility-administered medications on file prior to visit.     Review of Systems:  As per HPI- otherwise negative. No fever or chills  Physical Examination: Vitals:   03/05/18 0916  BP: 126/70  Pulse: 81  Resp: 16  SpO2: 98%   Vitals:   03/05/18 0916  Weight: 211 lb 3.2 oz (95.8 kg)  Height: 5' 8.5" (1.74 m)   Body mass index is 31.65 kg/m. Ideal Body Weight: Weight in (lb) to have BMI = 25: 166.5  GEN: WDWN, NAD, Non-toxic, A & O x 3, obese, looks well  HEENT: Atraumatic, Normocephalic. Neck supple. No masses, No LAD. Bilateral TM wnl, oropharynx normal.  PEERL,EOMI.   Normal cervical ROM, and ROM does not exacerbate pain I am able to palpate a small nidus of tenderness, which may be normal muscle tissue, running longitudinally along the right side lateral neck.   This does not seem to  represent a true mass.   No heat or rednesss noted  Ears and Nose: No external deformity. CV: RRR, No M/G/R. No JVD. No thrill. No extra heart sounds. PULM: CTA B, no wheezes, crackles, rhonchi. No retractions. No resp. distress. No accessory muscle use. EXTR: No c/c/e NEURO Normal gait.  PSYCH: Normally interactive. Conversant. Not depressed or anxious appearing.  Calm demeanor.  Wear flip flop type sandals.  Pt notes that she may have tenderness over the extensor tendons of the left 2nd toe at times   Assessment and Plan: Nonspecific pain in the neck region  Dyslipidemia  Tendonitis of foot  Neck pain and palpable tender tissue- ? Muscle.  We did a recent US to rule out LAD which was normal  Discussed further work- up.  Pt has noted pain for about 2 weeks now. She is otherwise well and I do not see any sign of infection.  Discussed doing a CT.  For the time being she would like to observe, but we decided if her sx are not resolved by the end of this month she will let me know and I will order further imaging, she will alert me sooner if worse Discussed wearing non- flip flop type shoes.  She will let me know if this does not resolve her sx   Signed Lamar Blinks, MD

## 2018-03-05 ENCOUNTER — Ambulatory Visit (INDEPENDENT_AMBULATORY_CARE_PROVIDER_SITE_OTHER): Payer: PPO | Admitting: Family Medicine

## 2018-03-05 ENCOUNTER — Encounter: Payer: Self-pay | Admitting: Family Medicine

## 2018-03-05 VITALS — BP 126/70 | HR 81 | Resp 16 | Ht 68.5 in | Wt 211.2 lb

## 2018-03-05 DIAGNOSIS — M779 Enthesopathy, unspecified: Secondary | ICD-10-CM | POA: Diagnosis not present

## 2018-03-05 DIAGNOSIS — M7750 Other enthesopathy of unspecified foot: Secondary | ICD-10-CM

## 2018-03-05 DIAGNOSIS — E785 Hyperlipidemia, unspecified: Secondary | ICD-10-CM | POA: Diagnosis not present

## 2018-03-05 DIAGNOSIS — M542 Cervicalgia: Secondary | ICD-10-CM

## 2018-03-05 NOTE — Patient Instructions (Signed)
Good to see you today!   If you continue to have the issue with your right neck at the end of this month please alert me and I will order a CT scan or other appropriate advanced imaging I think the pain in your foot may be caused by tendon strain secondary to wearing flip flops. Try a sandal that straps to your foot, but let me know if this continues to be an issue

## 2018-03-06 NOTE — Progress Notes (Addendum)
Subjective:   Susan Cortez is a 57 y.o. female who presents for Medicare Annual (Subsequent) preventive examination.  Review of Systems: No ROS.  Medicare Wellness Visit. Additional risk factors are reflected in the social history.   Sleep patterns: Sleep issues related to shoulder and neck pain. Takes Melatonin 3 mg on occasion.  Home Safety/Smoke Alarms: Feels safe in home. Smoke alarms in place.  Living environment; residence and Firearm Safety: Lives in 1st floor condo. Lives alone.    Female:   Pap-  12/26/15     Mammo-  utd     Dexa scan- utd       CCS- pt reports last in April 2019. Recall 3 yrs.    Objective:     Vitals: BP 140/68 (BP Location: Left Arm, Patient Position: Sitting, Cuff Size: Normal)   Pulse 89   Wt 211 lb 12.8 oz (96.1 kg)   HC 69" (175.3 cm)   SpO2 96%   BMI 31.74 kg/m   Body mass index is 31.74 kg/m.  Advanced Directives 03/07/2018 06/26/2017 03/28/2017 09/05/2016 04/03/2016 01/02/2016 10/06/2015  Does Patient Have a Medical Advance Directive? No No No No No No No  Would patient like information on creating a medical advance directive? Yes (MAU/Ambulatory/Procedural Areas - Information given) - - Yes (MAU/Ambulatory/Procedural Areas - Information given) No - patient declined information - -    Tobacco Social History   Tobacco Use  Smoking Status Current Every Day Smoker  . Packs/day: 1.00  . Years: 40.00  . Pack years: 40.00  Smokeless Tobacco Never Used  Tobacco Comment   pt declines     Ready to quit: No Counseling given: No Comment: pt declines   Clinical Intake: Pain : No/denies pain     Past Medical History:  Diagnosis Date  . Allergy   . Arthritis   . Basal cell carcinoma 2018   Removed from Right neck per patient    Past Surgical History:  Procedure Laterality Date  . CESAREAN SECTION     X 3  . DILATION AND CURETTAGE OF UTERUS    . SHOULDER ARTHROSCOPY WITH OPEN ROTATOR CUFF REPAIR Right 05/2009  . TONSILLECTOMY  1977    . TUBAL LIGATION     Family History  Problem Relation Age of Onset  . Diabetes Father   . Heart disease Father   . Hyperlipidemia Father    Social History   Socioeconomic History  . Marital status: Divorced    Spouse name: Not on file  . Number of children: Not on file  . Years of education: Not on file  . Highest education level: Not on file  Occupational History  . Not on file  Social Needs  . Financial resource strain: Not on file  . Food insecurity:    Worry: Not on file    Inability: Not on file  . Transportation needs:    Medical: Not on file    Non-medical: Not on file  Tobacco Use  . Smoking status: Current Every Day Smoker    Packs/day: 1.00    Years: 40.00    Pack years: 40.00  . Smokeless tobacco: Never Used  . Tobacco comment: pt declines  Substance and Sexual Activity  . Alcohol use: No  . Drug use: No  . Sexual activity: Not Currently    Partners: Male    Birth control/protection: Surgical  Lifestyle  . Physical activity:    Days per week: Not on file  Minutes per session: Not on file  . Stress: Not on file  Relationships  . Social connections:    Talks on phone: Not on file    Gets together: Not on file    Attends religious service: Not on file    Active member of club or organization: Not on file    Attends meetings of clubs or organizations: Not on file    Relationship status: Not on file  Other Topics Concern  . Not on file  Social History Narrative  . Not on file    Outpatient Encounter Medications as of 03/07/2018  Medication Sig  . acetaminophen-codeine (TYLENOL #4) 300-60 MG tablet Take 1 tablet by mouth daily.  . Calcium-Magnesium-Vitamin D (CALCIUM 500 PO) Take by mouth.  . cholecalciferol (VITAMIN D) 1000 units tablet Take 1,000 Units by mouth daily.  . diclofenac sodium (VOLTAREN) 1 % GEL Apply 2 g topically 4 (four) times daily.  Marland Kitchen lovastatin (MEVACOR) 20 MG tablet TAKE 1 TABLET(20 MG) BY MOUTH AT BEDTIME  . mirabegron ER  (MYRBETRIQ) 50 MG TB24 tablet Take by mouth.  . polyethylene glycol powder (GLYCOLAX/MIRALAX) powder Take 17 g 2 (two) times daily as needed by mouth. Use for constipation   No facility-administered encounter medications on file as of 03/07/2018.     Activities of Daily Living In your present state of health, do you have any difficulty performing the following activities: 03/07/2018 01/08/2018  Hearing? N N  Vision? N N  Difficulty concentrating or making decisions? N N  Walking or climbing stairs? N N  Dressing or bathing? N N  Doing errands, shopping? N N  Preparing Food and eating ? N -  Using the Toilet? N -  In the past six months, have you accidently leaked urine? N -  Do you have problems with loss of bowel control? N -  Managing your Medications? N -  Managing your Finances? N -  Housekeeping or managing your Housekeeping? N -  Some recent data might be hidden    Patient Care Team: Copland, Gay Filler, MD as PCP - General (Family Medicine) Kirsteins, Luanna Salk, MD as Consulting Physician (Pain Medicine) Jola Schmidt, MD as Consulting Physician (Ophthalmology)    Assessment:   This is a routine wellness examination for Susan Cortez. Physical assessment deferred to PCP.  Exercise Activities and Dietary recommendations Current Exercise Habits: The patient does not participate in regular exercise at present, Exercise limited by: None identified Diet (meal preparation, eat out, water intake, caffeinated beverages, dairy products, fruits and vegetables): in general, an "unhealthy" diet Breakfast: Coffee and cigarettes Lunch: Lasagna Dinner: cereal      Goals    . Weight (lb) < 200 lb (90.7 kg)       Fall Risk Fall Risk  03/07/2018 01/03/2018 09/24/2017 06/26/2017 03/28/2017  Falls in the past year? No No No No No  Comment - - - - -  Number falls in past yr: - - - - -  Injury with Fall? - - - - -  Comment - - - - -  Risk for fall due to : - - - - -  Follow up - - - - -     Depression Screen PHQ 2/9 Scores 03/07/2018 01/03/2018 06/26/2017 09/05/2016  PHQ - 2 Score 0 0 0 0  PHQ- 9 Score - - - -  Exception Documentation - - - -     Cognitive Function MMSE - Mini Mental State Exam 03/07/2018 09/05/2016  Orientation to  time 5 5  Orientation to Place 5 5  Registration 3 3  Attention/ Calculation 5 5  Recall 3 3  Language- name 2 objects 2 2  Language- repeat 1 1  Language- follow 3 step command 3 3  Language- read & follow direction 1 1  Write a sentence 1 1  Copy design 1 1  Total score 30 30        Immunization History  Administered Date(s) Administered  . Tdap 08/28/2011    Screening Tests Health Maintenance  Topic Date Due  . HIV Screening  10[birth year]10/1974  . INFLUENZA VACCINE  03/27/2018  . PAP SMEAR  12/26/2018  . MAMMOGRAM  11/19/2019  . TETANUS/TDAP  08/27/2021  . COLONOSCOPY  11/16/2027  . Hepatitis C Screening  Completed      Plan:    Please schedule your next medicare wellness visit with me in 1 yr.  Eat heart healthy diet (full of fruits, vegetables, whole grains, lean protein, water--limit salt, fat, and sugar intake) and increase physical activity as tolerated.  Continue doing brain stimulating activities (puzzles, reading, adult coloring books, staying active) to keep memory sharp.   Bring a copy of your living will and/or healthcare power of attorney to your next office visit.    I have personally reviewed and noted the following in the patient's chart:   . Medical and social history . Use of alcohol, tobacco or illicit drugs  . Current medications and supplements . Functional ability and status . Nutritional status . Physical activity . Advanced directives . List of other physicians . Hospitalizations, surgeries, and ER visits in previous 12 months . Vitals . Screenings to include cognitive, depression, and falls . Referrals and appointments  In addition, I have reviewed and discussed with patient certain  preventive protocols, quality metrics, and best practice recommendations. A written personalized care plan for preventive services as well as general preventive health recommendations were provided to patient.     Shela Nevin, [place]  03/07/2018  Reviewed and agree with assessment & plan of RN.  Mackie Pai, PA-C

## 2018-03-07 ENCOUNTER — Encounter: Payer: Self-pay | Admitting: *Deleted

## 2018-03-07 ENCOUNTER — Ambulatory Visit (INDEPENDENT_AMBULATORY_CARE_PROVIDER_SITE_OTHER): Payer: PPO | Admitting: *Deleted

## 2018-03-07 VITALS — BP 140/68 | HR 89 | Ht 69.0 in | Wt 211.8 lb

## 2018-03-07 DIAGNOSIS — Z0000 Encounter for general adult medical examination without abnormal findings: Secondary | ICD-10-CM

## 2018-03-07 NOTE — Patient Instructions (Signed)
Please schedule your next medicare wellness visit with me in 1 yr.  Eat heart healthy diet (full of fruits, vegetables, whole grains, lean protein, water--limit salt, fat, and sugar intake) and increase physical activity as tolerated.  Continue doing brain stimulating activities (puzzles, reading, adult coloring books, staying active) to keep memory sharp.   Bring a copy of your living will and/or healthcare power of attorney to your next office visit.   Susan Cortez , Thank you for taking time to come for your Medicare Wellness Visit. I appreciate your ongoing commitment to your health goals. Please review the following plan we discussed and let me know if I can assist you in the future.   These are the goals we discussed: Goals    . Weight (lb) < 200 lb (90.7 kg)       This is a list of the screening recommended for you and due dates:  Health Maintenance  Topic Date Due  . HIV Screening  10/10/1974  . Flu Shot  03/27/2018  . Pap Smear  12/26/2018  . Mammogram  11/19/2019  . Tetanus Vaccine  08/27/2021  . Colon Cancer Screening  11/16/2027  .  Hepatitis C: One time screening is recommended by Center for Disease Control  (CDC) for  adults born from [birth year] through 1966.   Completed    Health Maintenance for Postmenopausal Women Menopause is a normal process in which your reproductive ability comes to an end. This process happens gradually over a span of months to years, usually between the ages of 58 and 46. Menopause is complete when you have missed 12 consecutive menstrual periods. It is important to talk with your health care provider about some of the most common conditions that affect postmenopausal women, such as heart disease, cancer, and bone loss (osteoporosis). Adopting a healthy lifestyle and getting preventive care can help to promote your health and wellness. Those actions can also lower your chances of developing some of these common conditions. What should I know about  menopause? During menopause, you may experience a number of symptoms, such as:  Moderate-to-severe hot flashes.  Night sweats.  Decrease in sex drive.  Mood swings.  Headaches.  Tiredness.  Irritability.  Memory problems.  Insomnia.  Choosing to treat or not to treat menopausal changes is an individual decision that you make with your health care provider. What should I know about hormone replacement therapy and supplements? Hormone therapy products are effective for treating symptoms that are associated with menopause, such as hot flashes and night sweats. Hormone replacement carries certain risks, especially as you become older. If you are thinking about using estrogen or estrogen with progestin treatments, discuss the benefits and risks with your health care provider. What should I know about heart disease and stroke? Heart disease, heart attack, and stroke become more likely as you age. This may be due, in part, to the hormonal changes that your body experiences during menopause. These can affect how your body processes dietary fats, triglycerides, and cholesterol. Heart attack and stroke are both medical emergencies. There are many things that you can do to help prevent heart disease and stroke:  Have your blood pressure checked at least every 1-2 years. High blood pressure causes heart disease and increases the risk of stroke.  If you are 18-70 years old, ask your health care provider if you should take aspirin to prevent a heart attack or a stroke.  Do not use any tobacco products, including cigarettes, chewing tobacco, or  electronic cigarettes. If you need help quitting, ask your health care provider.  It is important to eat a healthy diet and maintain a healthy weight. ? Be sure to include plenty of vegetables, fruits, low-fat dairy products, and lean protein. ? Avoid eating foods that are high in solid fats, added sugars, or salt (sodium).  Get regular exercise. This  is one of the most important things that you can do for your health. ? Try to exercise for at least 150 minutes each week. The type of exercise that you do should increase your heart rate and make you sweat. This is known as moderate-intensity exercise. ? Try to do strengthening exercises at least twice each week. Do these in addition to the moderate-intensity exercise.  Know your numbers.Ask your health care provider to check your cholesterol and your blood glucose. Continue to have your blood tested as directed by your health care provider.  What should I know about cancer screening? There are several types of cancer. Take the following steps to reduce your risk and to catch any cancer development as early as possible. Breast Cancer  Practice breast self-awareness. ? This means understanding how your breasts normally appear and feel. ? It also means doing regular breast self-exams. Let your health care provider know about any changes, no matter how small.  If you are 34 or older, have a clinician do a breast exam (clinical breast exam or CBE) every year. Depending on your age, family history, and medical history, it may be recommended that you also have a yearly breast X-ray (mammogram).  If you have a family history of breast cancer, talk with your health care provider about genetic screening.  If you are at high risk for breast cancer, talk with your health care provider about having an MRI and a mammogram every year.  Breast cancer (BRCA) gene test is recommended for women who have family members with BRCA-related cancers. Results of the assessment will determine the need for genetic counseling and BRCA1 and for BRCA2 testing. BRCA-related cancers include these types: ? Breast. This occurs in males or females. ? Ovarian. ? Tubal. This may also be called fallopian tube cancer. ? Cancer of the abdominal or pelvic lining (peritoneal cancer). ? Prostate. ? Pancreatic.  Cervical,  Uterine, and Ovarian Cancer Your health care provider may recommend that you be screened regularly for cancer of the pelvic organs. These include your ovaries, uterus, and vagina. This screening involves a pelvic exam, which includes checking for microscopic changes to the surface of your cervix (Pap test).  For women ages 21-65, health care providers may recommend a pelvic exam and a Pap test every three years. For women ages 49-65, they may recommend the Pap test and pelvic exam, combined with testing for human papilloma virus (HPV), every five years. Some types of HPV increase your risk of cervical cancer. Testing for HPV may also be done on women of any age who have unclear Pap test results.  Other health care providers may not recommend any screening for nonpregnant women who are considered low risk for pelvic cancer and have no symptoms. Ask your health care provider if a screening pelvic exam is right for you.  If you have had past treatment for cervical cancer or a condition that could lead to cancer, you need Pap tests and screening for cancer for at least 20 years after your treatment. If Pap tests have been discontinued for you, your risk factors (such as having a new sexual  partner) need to be reassessed to determine if you should start having screenings again. Some women have medical problems that increase the chance of getting cervical cancer. In these cases, your health care provider may recommend that you have screening and Pap tests more often.  If you have a family history of uterine cancer or ovarian cancer, talk with your health care provider about genetic screening.  If you have vaginal bleeding after reaching menopause, tell your health care provider.  There are currently no reliable tests available to screen for ovarian cancer.  Lung Cancer Lung cancer screening is recommended for adults 47-67 years old who are at high risk for lung cancer because of a history of smoking. A  yearly low-dose CT scan of the lungs is recommended if you:  Currently smoke.  Have a history of at least 30 pack-years of smoking and you currently smoke or have quit within the past 15 years. A pack-year is smoking an average of one pack of cigarettes per day for one year.  Yearly screening should:  Continue until it has been 15 years since you quit.  Stop if you develop a health problem that would prevent you from having lung cancer treatment.  Colorectal Cancer  This type of cancer can be detected and can often be prevented.  Routine colorectal cancer screening usually begins at age 11 and continues through age 22.  If you have risk factors for colon cancer, your health care provider may recommend that you be screened at an earlier age.  If you have a family history of colorectal cancer, talk with your health care provider about genetic screening.  Your health care provider may also recommend using home test kits to check for hidden blood in your stool.  A small camera at the end of a tube can be used to examine your colon directly (sigmoidoscopy or colonoscopy). This is done to check for the earliest forms of colorectal cancer.  Direct examination of the colon should be repeated every 5-10 years until age 70. However, if early forms of precancerous polyps or small growths are found or if you have a family history or genetic risk for colorectal cancer, you may need to be screened more often.  Skin Cancer  Check your skin from head to toe regularly.  Monitor any moles. Be sure to tell your health care provider: ? About any new moles or changes in moles, especially if there is a change in a mole's shape or color. ? If you have a mole that is larger than the size of a pencil eraser.  If any of your family members has a history of skin cancer, especially at a young age, talk with your health care provider about genetic screening.  Always use sunscreen. Apply sunscreen liberally  and repeatedly throughout the day.  Whenever you are outside, protect yourself by wearing long sleeves, pants, a wide-brimmed hat, and sunglasses.  What should I know about osteoporosis? Osteoporosis is a condition in which bone destruction happens more quickly than new bone creation. After menopause, you may be at an increased risk for osteoporosis. To help prevent osteoporosis or the bone fractures that can happen because of osteoporosis, the following is recommended:  If you are 34-66 years old, get at least 1,000 mg of calcium and at least 600 mg of vitamin D per day.  If you are older than age 98 but younger than age 14, get at least 1,200 mg of calcium and at least 600 mg  of vitamin D per day.  If you are older than age 50, get at least 1,200 mg of calcium and at least 800 mg of vitamin D per day.  Smoking and excessive alcohol intake increase the risk of osteoporosis. Eat foods that are rich in calcium and vitamin D, and do weight-bearing exercises several times each week as directed by your health care provider. What should I know about how menopause affects my mental health? Depression may occur at any age, but it is more common as you become older. Common symptoms of depression include:  Low or sad mood.  Changes in sleep patterns.  Changes in appetite or eating patterns.  Feeling an overall lack of motivation or enjoyment of activities that you previously enjoyed.  Frequent crying spells.  Talk with your health care provider if you think that you are experiencing depression. What should I know about immunizations? It is important that you get and maintain your immunizations. These include:  Tetanus, diphtheria, and pertussis (Tdap) booster vaccine.  Influenza every year before the flu season begins.  Pneumonia vaccine.  Shingles vaccine.  Your health care provider may also recommend other immunizations. This information is not intended to replace advice given to you  by your health care provider. Make sure you discuss any questions you have with your health care provider. Document Released: 10/05/2005 Document Revised: 03/02/2016 Document Reviewed: 05/17/2015 Elsevier Interactive Patient Education  2018 [employer].

## 2018-03-24 ENCOUNTER — Encounter: Payer: Self-pay | Admitting: Registered Nurse

## 2018-03-24 ENCOUNTER — Encounter: Payer: PPO | Attending: Registered Nurse | Admitting: Registered Nurse

## 2018-03-24 VITALS — BP 115/73 | HR 97 | Ht 68.5 in | Wt 211.0 lb

## 2018-03-24 DIAGNOSIS — F17210 Nicotine dependence, cigarettes, uncomplicated: Secondary | ICD-10-CM | POA: Diagnosis not present

## 2018-03-24 DIAGNOSIS — G4700 Insomnia, unspecified: Secondary | ICD-10-CM | POA: Insufficient documentation

## 2018-03-24 DIAGNOSIS — M545 Low back pain: Secondary | ICD-10-CM | POA: Diagnosis not present

## 2018-03-24 DIAGNOSIS — M4802 Spinal stenosis, cervical region: Secondary | ICD-10-CM

## 2018-03-24 DIAGNOSIS — G894 Chronic pain syndrome: Secondary | ICD-10-CM

## 2018-03-24 DIAGNOSIS — M542 Cervicalgia: Secondary | ICD-10-CM | POA: Insufficient documentation

## 2018-03-24 DIAGNOSIS — M25511 Pain in right shoulder: Secondary | ICD-10-CM

## 2018-03-24 DIAGNOSIS — Z8249 Family history of ischemic heart disease and other diseases of the circulatory system: Secondary | ICD-10-CM | POA: Diagnosis not present

## 2018-03-24 DIAGNOSIS — Z79899 Other long term (current) drug therapy: Secondary | ICD-10-CM | POA: Diagnosis not present

## 2018-03-24 DIAGNOSIS — Z5181 Encounter for therapeutic drug level monitoring: Secondary | ICD-10-CM | POA: Diagnosis not present

## 2018-03-24 DIAGNOSIS — G8929 Other chronic pain: Secondary | ICD-10-CM | POA: Diagnosis not present

## 2018-03-24 DIAGNOSIS — Z833 Family history of diabetes mellitus: Secondary | ICD-10-CM | POA: Insufficient documentation

## 2018-03-24 DIAGNOSIS — M1990 Unspecified osteoarthritis, unspecified site: Secondary | ICD-10-CM | POA: Insufficient documentation

## 2018-03-24 MED ORDER — ACETAMINOPHEN-CODEINE #4 300-60 MG PO TABS: 1.0000 | ORAL_TABLET | Freq: Every day | ORAL | 2 refills | Status: DC

## 2018-03-24 NOTE — Progress Notes (Signed)
Subjective:    Patient ID: Susan Cortez, female    DOB: [birth year], 57 y.o.   MRN: [id number]  HPI: [name] is a 57 year old female who returns for follow up appointment for chronic pain and medication refill. She states her pain is located in her neck radiating into her right shoulder and right hand numbness. She rates her pain 7. Her current exercise regime is walking.   [name] Morphine Equivalent is 9.00 MME.  Pain Inventory Average Pain 7 Pain Right Now 7 My pain is sharp, burning, stabbing and aching  In the last 24 hours, has pain interfered with the following? General activity 3 Relation with others 1 Enjoyment of life 3 What TIME of day is your pain at its worst? evening Sleep (in general) Fair  Pain is worse with: walking, bending, standing and some activites Pain improves with: rest, pacing activities and medication Relief from Meds: 8  Mobility Do you have any goals in this area?  no  Function Do you have any goals in this area?  no  Neuro/Psych No problems in this area  Prior Studies Any changes since last visit?  no  Physicians involved in your care Any changes since last visit?  no   Family History  Problem Relation Age of Onset  . Diabetes Father   . Heart disease Father   . Hyperlipidemia Father    Social History   Socioeconomic History  . Marital status: Divorced    Spouse name: Not on file  . Number of children: Not on file  . Years of education: Not on file  . Highest education level: Not on file  Occupational History  . Not on file  Social Needs  . Financial resource strain: Not on file  . Food insecurity:    Worry: Not on file    Inability: Not on file  . Transportation needs:    Medical: Not on file    Non-medical: Not on file  Tobacco Use  . Smoking status: Current Every Day Smoker    Packs/day: 1.00    Years: 40.00    Pack years: 40.00  . Smokeless tobacco: Never Used  . Tobacco comment: pt declines    Substance and Sexual Activity  . Alcohol use: No  . Drug use: No  . Sexual activity: Not Currently    Partners: Male    Birth control/protection: Surgical  Lifestyle  . Physical activity:    Days per week: Not on file    Minutes per session: Not on file  . Stress: Not on file  Relationships  . Social connections:    Talks on phone: Not on file    Gets together: Not on file    Attends religious service: Not on file    Active member of club or organization: Not on file    Attends meetings of clubs or organizations: Not on file    Relationship status: Not on file  Other Topics Concern  . Not on file  Social History Narrative  . Not on file   Past Surgical History:  Procedure Laterality Date  . CESAREAN SECTION     X 3  . DILATION AND CURETTAGE OF UTERUS    . SHOULDER ARTHROSCOPY WITH OPEN ROTATOR CUFF REPAIR Right 05/2009  . TONSILLECTOMY  1977  . TUBAL LIGATION     Past Medical History:  Diagnosis Date  . Allergy   . Arthritis   . Basal cell carcinoma 2018  Removed from Right neck per patient    BP 115/73   Pulse 97   Ht 5' 8.5" (1.74 m)   Wt 211 lb (95.7 kg)   SpO2 96%   BMI 31.62 kg/m   Opioid Risk Score:   Fall Risk Score:  `1  Depression screen PHQ 2/9  Depression screen [facility] 2/9 03/07/2018 01/03/2018 06/26/2017 09/05/2016 04/03/2016 12/26/2015 05/05/2015  Decreased Interest 0 0 0 0 3 0 3  Down, Depressed, Hopeless 0 0 0 0 0 0 0  PHQ - 2 Score 0 0 0 0 3 0 3  Altered sleeping - - - - - - -  Tired, decreased energy - - - - - - -  Change in appetite - - - - - - -  Feeling bad or failure about yourself  - - - - - - -  Trouble concentrating - - - - - - -  Moving slowly or fidgety/restless - - - - - - -  Suicidal thoughts - - - - - - -  PHQ-9 Score - - - - - - -     Review of Systems  Constitutional: Positive for diaphoresis.  HENT: Negative.   Eyes: Negative.   Respiratory: Negative.   Cardiovascular: Negative.   Gastrointestinal: Positive for  constipation.  Endocrine: Negative.   Genitourinary: Negative.   Musculoskeletal: Positive for arthralgias, myalgias, neck pain and neck stiffness.  Skin: Negative.   Allergic/Immunologic: Negative.   Neurological: Negative.   Hematological: Negative.   Psychiatric/Behavioral: Negative.   All other systems reviewed and are negative.      Objective:   Physical Exam  Constitutional: She is oriented to person, place, and time. She appears well-developed and well-nourished.  HENT:  Head: Normocephalic and atraumatic.  Neck: Normal range of motion. Neck supple.  Cervical Paraspinal Tenderness: C-5-C-6  Cardiovascular: Normal rate and regular rhythm.  Pulmonary/Chest: Effort normal and breath sounds normal.  Musculoskeletal:  Normal Muscle Bulk and Muscle Testing Reveals: Upper Extremities: Full ROM and Muscle Strength 5/5 Right AC Joint Tenderness Lower Extremities: Full ROM and Muscle Strength 5/5 Arises from chair with ease Narrow Based Gait  Neurological: She is alert and oriented to person, place, and time.  Skin: Skin is warm and dry.  Psychiatric: She has a normal mood and affect. Her behavior is normal.  Nursing note and vitals reviewed.         Assessment & Plan:  1.Cervical spinal stenosis without evidence ofradiculopathy. Chronic neck pain: Refilled Tylenol #4 300/60mg  daily HS.#30.03/24/2018 2. Right shoulder rotator cuff tear, status post repair with chronic pain and contracture : Continue with exercise regimeas tolerated.03/24/2018 3. Insomnia:Continue Melatonin. 03/24/2018 4. Chronic Bilateral Lower Back Pain without Sciatica: No complaints Today. Continue HEP as tolerated. Continue to Monitor. 03/24/2018  15 minutes of face to face patient care time was spent during this visit. All questions were encouraged and answered.   F/U 3 months

## 2018-05-05 DIAGNOSIS — L720 Epidermal cyst: Secondary | ICD-10-CM | POA: Diagnosis not present

## 2018-05-05 DIAGNOSIS — L738 Other specified follicular disorders: Secondary | ICD-10-CM | POA: Diagnosis not present

## 2018-05-05 DIAGNOSIS — D1801 Hemangioma of skin and subcutaneous tissue: Secondary | ICD-10-CM | POA: Diagnosis not present

## 2018-05-05 DIAGNOSIS — L821 Other seborrheic keratosis: Secondary | ICD-10-CM | POA: Diagnosis not present

## 2018-05-05 DIAGNOSIS — L280 Lichen simplex chronicus: Secondary | ICD-10-CM | POA: Diagnosis not present

## 2018-05-05 DIAGNOSIS — Z85828 Personal history of other malignant neoplasm of skin: Secondary | ICD-10-CM | POA: Diagnosis not present

## 2018-05-29 ENCOUNTER — Ambulatory Visit: Payer: PPO | Admitting: Physical Medicine & Rehabilitation

## 2018-05-29 ENCOUNTER — Encounter: Payer: Self-pay | Admitting: Physical Medicine & Rehabilitation

## 2018-05-29 ENCOUNTER — Encounter: Payer: PPO | Attending: Registered Nurse

## 2018-05-29 VITALS — BP 118/76 | HR 80 | Resp 14 | Ht 68.5 in | Wt 212.0 lb

## 2018-05-29 DIAGNOSIS — Z833 Family history of diabetes mellitus: Secondary | ICD-10-CM | POA: Diagnosis not present

## 2018-05-29 DIAGNOSIS — M545 Low back pain: Secondary | ICD-10-CM | POA: Insufficient documentation

## 2018-05-29 DIAGNOSIS — G894 Chronic pain syndrome: Secondary | ICD-10-CM | POA: Diagnosis not present

## 2018-05-29 DIAGNOSIS — Z5181 Encounter for therapeutic drug level monitoring: Secondary | ICD-10-CM

## 2018-05-29 DIAGNOSIS — M4802 Spinal stenosis, cervical region: Secondary | ICD-10-CM | POA: Diagnosis not present

## 2018-05-29 DIAGNOSIS — M25511 Pain in right shoulder: Secondary | ICD-10-CM

## 2018-05-29 DIAGNOSIS — G4700 Insomnia, unspecified: Secondary | ICD-10-CM | POA: Insufficient documentation

## 2018-05-29 DIAGNOSIS — M1990 Unspecified osteoarthritis, unspecified site: Secondary | ICD-10-CM | POA: Insufficient documentation

## 2018-05-29 DIAGNOSIS — M542 Cervicalgia: Secondary | ICD-10-CM | POA: Diagnosis not present

## 2018-05-29 DIAGNOSIS — F17210 Nicotine dependence, cigarettes, uncomplicated: Secondary | ICD-10-CM | POA: Insufficient documentation

## 2018-05-29 DIAGNOSIS — G8929 Other chronic pain: Secondary | ICD-10-CM | POA: Insufficient documentation

## 2018-05-29 DIAGNOSIS — Z79891 Long term (current) use of opiate analgesic: Secondary | ICD-10-CM

## 2018-05-29 DIAGNOSIS — Z8249 Family history of ischemic heart disease and other diseases of the circulatory system: Secondary | ICD-10-CM | POA: Diagnosis not present

## 2018-05-29 MED ORDER — ACETAMINOPHEN-CODEINE #4 300-60 MG PO TABS: 1.0000 | ORAL_TABLET | Freq: Every day | ORAL | 2 refills | Status: DC

## 2018-05-29 NOTE — Progress Notes (Signed)
Subjective:    Patient ID: Susan Cortez, female    DOB: 1961-01-03, 57 y.o.   MRN: [id number] Neck pain and Right shoulder pain WC DOI:  09/22/2007, Mechanism of injury Looking up at work on a repetitive basis, overhead reaching R arm repetitive  Initially was treated for neck pain, then was evaluated for Right shoulder pain.   Xray report 10/19/2008- C5-C6 and C6-C7 degenerative disc and spondylosis causing moderate to severe bilateral neural foraminal encroachment C6-C7 greater than C5-C6  Initially underwent physical therapy working both on shoulder and neck. This was not helpful, postop shoulder rehabilitation was helpful  Cervical MRI was performed. Orthopedic spine surgeon did not feel that surgery would be helpful. Cervical epidural injection was not helpful.  EMG bilateral upper limbs performed on 3/09-2008 was normal  Right shoulder MRI 03/09/2009 ordered by orthopedics shoulder surgeon. Full thickness tear supraspinatus and partial tear infraspinatus down sloping acromion.  Patient underwent open right shoulder decompression and rotator cuff repair HPI CC R neck and shoulder pain Patient doing well on current medication no constipation.  Patient had right knee pain x1 month she had no injury but she had spontaneous resolution of pain the pain was in the kneecap area. No history of knee surgery in the past.  No numbness or tingling in the right knee or down the right lower extremity. Left wrist pain has improved as well.     Pain Inventory Average Pain 6 Pain Right Now 7 My pain is sharp, dull, stabbing and aching  In the last 24 hours, has pain interfered with the following? General activity 2 Relation with others 3 Enjoyment of life 2 What TIME of day is your pain at its worst? evening, night  Sleep (in general) Poor  Pain is worse with: walking, bending, standing and some activites Pain improves with: rest, heat/ice, pacing activities and medication Relief  from Meds: 7  Mobility walk without assistance ability to climb steps?  yes do you drive?  yes Do you have any goals in this area?  no  Function Do you have any goals in this area?  no  Neuro/Psych No problems in this area  Prior Studies Any changes since last visit?  no  Physicians involved in your care Any changes since last visit?  no   Family History  Problem Relation Age of Onset  . Diabetes Father   . Heart disease Father   . Hyperlipidemia Father    Social History   Socioeconomic History  . Marital status: Divorced    Spouse name: Not on file  . Number of children: Not on file  . Years of education: Not on file  . Highest education level: Not on file  Occupational History  . Not on file  Social Needs  . Financial resource strain: Not on file  . Food insecurity:    Worry: Not on file    Inability: Not on file  . Transportation needs:    Medical: Not on file    Non-medical: Not on file  Tobacco Use  . Smoking status: Current Every Day Smoker    Packs/day: 1.00    Years: 40.00    Pack years: 40.00  . Smokeless tobacco: Never Used  . Tobacco comment: pt declines  Substance and Sexual Activity  . Alcohol use: No  . Drug use: No  . Sexual activity: Not Currently    Partners: Male    Birth control/protection: Surgical  Lifestyle  . Physical activity:  Days per week: Not on file    Minutes per session: Not on file  . Stress: Not on file  Relationships  . Social connections:    Talks on phone: Not on file    Gets together: Not on file    Attends religious service: Not on file    Active member of club or organization: Not on file    Attends meetings of clubs or organizations: Not on file    Relationship status: Not on file  Other Topics Concern  . Not on file  Social History Narrative  . Not on file   Past Surgical History:  Procedure Laterality Date  . CESAREAN SECTION     X 3  . DILATION AND CURETTAGE OF UTERUS    . SHOULDER  ARTHROSCOPY WITH OPEN ROTATOR CUFF REPAIR Right 05/2009  . TONSILLECTOMY  1977  . TUBAL LIGATION     Past Medical History:  Diagnosis Date  . Allergy   . Arthritis   . Basal cell carcinoma 2018   Removed from Right neck per patient    BP 118/76   Pulse 80   Resp 14   Ht 5' 8.5" (1.74 m)   Wt 212 lb (96.2 kg)   SpO2 97%   BMI 31.77 kg/m   Opioid Risk Score:   Fall Risk Score:  `1  Depression screen PHQ 2/9  Depression screen [facility] 2/9 03/07/2018 01/03/2018 06/26/2017 09/05/2016 04/03/2016 12/26/2015 05/05/2015  Decreased Interest 0 0 0 0 3 0 3  Down, Depressed, Hopeless 0 0 0 0 0 0 0  PHQ - 2 Score 0 0 0 0 3 0 3  Altered sleeping - - - - - - -  Tired, decreased energy - - - - - - -  Change in appetite - - - - - - -  Feeling bad or failure about yourself  - - - - - - -  Trouble concentrating - - - - - - -  Moving slowly or fidgety/restless - - - - - - -  Suicidal thoughts - - - - - - -  PHQ-9 Score - - - - - - -    Review of Systems  Constitutional: Negative.   HENT: Negative.   Eyes: Negative.   Respiratory: Negative.   Cardiovascular: Negative.   Gastrointestinal: Negative.   Endocrine: Negative.   Genitourinary: Negative.   Musculoskeletal: Positive for arthralgias and neck pain.  Allergic/Immunologic: Negative.   Neurological: Negative.   Hematological: Negative.   Psychiatric/Behavioral: Negative.   All other systems reviewed and are negative.      Objective:   Physical Exam  Constitutional: She is oriented to person, place, and time. She appears well-developed and well-nourished. No distress.  HENT:  Head: Normocephalic and atraumatic.  Eyes: Pupils are equal, round, and reactive to light. EOM are normal.  Neck: Normal range of motion.  Mild tenderness over the right greater than left upper trapezius. There is no evidence of torticollis  Musculoskeletal:  No tenderness palpation lumbar paraspinal area no pain with lumbar spine range of motion  Right knee  no evidence of effusion no joint line tenderness no tenderness along the tibial tubercle or the patellar or quadriceps tendons. Gait is without evidence of toe drag or knee instability  Neurological: She is alert and oriented to person, place, and time.  Motor strength is 5/5 bilateral deltoid, bicep, tricep, grip   Skin: She is not diaphoretic.  Nursing note and vitals reviewed.  Assessment & Plan:  1.  Chronic right-sided neck and shoulder pain she does have history of cervical spinal stenosis as well as right shoulder rotator cuff syndrome.  Overall functioning at a modified independent level.  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 Continue current pain medication Tylenol No. 4 1 tablet daily #30 2 refills. Controlled substance agreement reviewed and signed today Urine drug screen today

## 2018-06-05 LAB — 6-ACETYLMORPHINE,TOXASSURE ADD
6-ACETYLMORPHINE: NEGATIVE
6-acetylmorphine: NOT DETECTED ng/mg creat

## 2018-06-05 LAB — TOXASSURE SELECT,+ANTIDEPR,UR

## 2018-06-06 ENCOUNTER — Telehealth: Payer: Self-pay | Admitting: *Deleted

## 2018-06-06 NOTE — Telephone Encounter (Signed)
Urine drug screen for this encounter is consistent for prescribed medication 

## 2018-08-11 ENCOUNTER — Encounter: Payer: Self-pay | Admitting: Family

## 2018-08-11 ENCOUNTER — Ambulatory Visit (INDEPENDENT_AMBULATORY_CARE_PROVIDER_SITE_OTHER): Payer: PPO | Admitting: Family

## 2018-08-11 VITALS — BP 126/57 | HR 100 | Temp 99.4°F | Resp 16 | Ht 68.5 in | Wt 216.0 lb

## 2018-08-11 DIAGNOSIS — S9002XA Contusion of left ankle, initial encounter: Secondary | ICD-10-CM | POA: Diagnosis not present

## 2018-08-11 DIAGNOSIS — J019 Acute sinusitis, unspecified: Secondary | ICD-10-CM | POA: Diagnosis not present

## 2018-08-11 DIAGNOSIS — J029 Acute pharyngitis, unspecified: Secondary | ICD-10-CM

## 2018-08-11 LAB — POCT RAPID STREP A (OFFICE): Rapid Strep A Screen: NEGATIVE

## 2018-08-11 MED ORDER — AMOXICILLIN-POT CLAVULANATE 875-125 MG PO TABS: 1.0000 | ORAL_TABLET | Freq: Two times a day (BID) | ORAL | 0 refills | Status: DC

## 2018-08-11 NOTE — Patient Instructions (Signed)
Please begin augmentin for your sinus infection. Call if symptoms worsen or if not improved in 2-3 days. You may use tylenol as needed for pain.

## 2018-08-11 NOTE — Progress Notes (Signed)
Subjective:    Patient ID: Susan Cortez, female    DOB: 04-15-1961, 57 y.o.   MRN: [id number]  HPI  Patient is a 57 yr old female with c/o facial pain, sinus congestion, sore thoat and chills. Reports that she has a subjective temp. Not using any otc symptoms  Left inner ankle- was kit by a ball. Did not hurt until today.    Review of Systems See HPI  Past Medical History:  Diagnosis Date  . Allergy   . Arthritis   . Basal cell carcinoma 2018   Removed from Right neck per patient      Social History   Socioeconomic History  . Marital status: Divorced    Spouse name: Not on file  . Number of children: Not on file  . Years of education: Not on file  . Highest education level: Not on file  Occupational History  . Not on file  Social Needs  . Financial resource strain: Not on file  . Food insecurity:    Worry: Not on file    Inability: Not on file  . Transportation needs:    Medical: Not on file    Non-medical: Not on file  Tobacco Use  . Smoking status: Current Every Day Smoker    Packs/day: 1.00    Years: 40.00    Pack years: 40.00  . Smokeless tobacco: Never Used  . Tobacco comment: pt declines  Substance and Sexual Activity  . Alcohol use: No  . Drug use: No  . Sexual activity: Not Currently    Partners: Male    Birth control/protection: Surgical  Lifestyle  . Physical activity:    Days per week: Not on file    Minutes per session: Not on file  . Stress: Not on file  Relationships  . Social connections:    Talks on phone: Not on file    Gets together: Not on file    Attends religious service: Not on file    Active member of club or organization: Not on file    Attends meetings of clubs or organizations: Not on file    Relationship status: Not on file  . Intimate partner violence:    Fear of current or ex partner: Not on file    Emotionally abused: Not on file    Physically abused: Not on file    Forced sexual activity: Not on file  Other  Topics Concern  . Not on file  Social History Narrative  . Not on file    Past Surgical History:  Procedure Laterality Date  . CESAREAN SECTION     X 3  . DILATION AND CURETTAGE OF UTERUS    . SHOULDER ARTHROSCOPY WITH OPEN ROTATOR CUFF REPAIR Right 05/2009  . TONSILLECTOMY  1977  . TUBAL LIGATION      Family History  Problem Relation Age of Onset  . Diabetes Father   . Heart disease Father   . Hyperlipidemia Father     Allergies  Allergen Reactions  . Chantix [Varenicline] Other (See Comments)    Strange dreams/hallucinations  . Diclofenac Swelling  . Meloxicam Swelling  . Butrans [Buprenorphine] Itching    Itching and redness at the area of the patch.  Recardo Evangelist [Pregabalin]     Headache,severe eye pain, swelling  . Nsaids     Stomach pain  . Tolmetin Other (See Comments)    Stomach pain    Current Outpatient Medications on File Prior to Visit  Medication Sig Dispense Refill  . acetaminophen-codeine (TYLENOL #4) 300-60 MG tablet Take 1 tablet by mouth daily. 30 tablet 2  . Calcium-Magnesium-Vitamin D (CALCIUM 500 PO) Take by mouth.    . cholecalciferol (VITAMIN D) 1000 units tablet Take 1,000 Units by mouth daily.    . diclofenac sodium (VOLTAREN) 1 % GEL Apply 2 g topically 4 (four) times daily. 3 Tube 1  . lovastatin (MEVACOR) 20 MG tablet TAKE 1 TABLET(20 MG) BY MOUTH AT BEDTIME 90 tablet 1  . mirabegron ER (MYRBETRIQ) 50 MG TB24 tablet Take by mouth.    . polyethylene glycol powder (GLYCOLAX/MIRALAX) powder Take 17 g 2 (two) times daily as needed by mouth. Use for constipation 3350 g 6   No current facility-administered medications on file prior to visit.     BP (!) 126/57 (BP Location: Left Arm, Patient Position: Sitting, Cuff Size: Small)   Pulse 100   Temp 99.4 F (37.4 C) (Oral)   Resp 16   Ht 5' 8.5" (1.74 m)   Wt 216 lb (98 kg)   SpO2 99%   BMI 32.36 kg/m       Objective:   Physical Exam Constitutional:      Appearance: She is  well-developed.  HENT:     Nose:     Right Sinus: Maxillary sinus tenderness and frontal sinus tenderness present.     Left Sinus: Maxillary sinus tenderness and frontal sinus tenderness present.     Mouth/Throat:     Pharynx: Posterior oropharyngeal erythema present.     Tonsils: No tonsillar exudate or tonsillar abscesses.  Neck:     Musculoskeletal: Neck supple.     Thyroid: No thyromegaly.  Cardiovascular:     Rate and Rhythm: Normal rate and regular rhythm.     Heart sounds: Normal heart sounds. No murmur.  Pulmonary:     Effort: Pulmonary effort is normal. No respiratory distress.     Breath sounds: Normal breath sounds. No wheezing.  Musculoskeletal:     Comments: + tenderness to palpation above the left inner ankle , trace swelling  Skin:    General: Skin is warm and dry.  Neurological:     Mental Status: She is alert and oriented to person, place, and time.  Psychiatric:        Behavior: Behavior normal.        Thought Content: Thought content normal.        Judgment: Judgment normal.           Assessment & Plan:  Sinusitis- Patient is advised as follows:  Please begin augmentin for your sinus infection. Call if symptoms worsen or if not improved in 2-3 days. You may use tylenol as needed for pain.   Ankle contusion- reassurance provided.  Mild.

## 2018-08-18 ENCOUNTER — Encounter: Payer: Self-pay | Admitting: Medical

## 2018-08-18 ENCOUNTER — Ambulatory Visit (HOSPITAL_BASED_OUTPATIENT_CLINIC_OR_DEPARTMENT_OTHER)
Admission: RE | Admit: 2018-08-18 | Discharge: 2018-08-18 | Disposition: A | Discharge status: Home / Self Care | Payer: PPO | Source: Ambulatory Visit | Attending: Medical | Admitting: Medical

## 2018-08-18 ENCOUNTER — Other Ambulatory Visit: Payer: Self-pay | Admitting: Medical

## 2018-08-18 ENCOUNTER — Ambulatory Visit (INDEPENDENT_AMBULATORY_CARE_PROVIDER_SITE_OTHER): Payer: PPO | Admitting: Medical

## 2018-08-18 VITALS — BP 122/68 | HR 73 | Temp 98.3°F | Resp 16 | Ht 68.5 in | Wt 216.4 lb

## 2018-08-18 DIAGNOSIS — R05 Cough: Secondary | ICD-10-CM | POA: Diagnosis not present

## 2018-08-18 DIAGNOSIS — J019 Acute sinusitis, unspecified: Secondary | ICD-10-CM | POA: Diagnosis not present

## 2018-08-18 DIAGNOSIS — R059 Cough, unspecified: Secondary | ICD-10-CM

## 2018-08-18 DIAGNOSIS — J40 Bronchitis, not specified as acute or chronic: Secondary | ICD-10-CM

## 2018-08-18 MED ORDER — CEFTRIAXONE SODIUM 1 G IJ SOLR
1.0000 g | Freq: Once | INTRAMUSCULAR | Status: AC
  Administered 2018-08-18: 1 g via INTRAMUSCULAR

## 2018-08-18 MED ORDER — BENZONATATE 100 MG PO CAPS: 100.0000 mg | ORAL_CAPSULE | Freq: Three times a day (TID) | ORAL | 0 refills | Status: DC | PRN

## 2018-08-18 MED ORDER — AZITHROMYCIN 250 MG PO TABS: ORAL_TABLET | ORAL | 0 refills | Status: DC

## 2018-08-18 MED ORDER — FLUTICASONE PROPIONATE 50 MCG/ACT NA SUSP: 2.0000 | Freq: Every day | NASAL | 1 refills | Status: DC

## 2018-08-18 NOTE — Progress Notes (Signed)
Subjective:    Patient ID: Susan Cortez, female    DOB: 1960-12-21, 57 y.o.   MRN: [id number]  HPI  Pt in states last Tuesday she woke up with worse nasal congestion and chest congestion. Coughing up mucus and blowing out mucus from her nose.   Pt was given augmentin for 7 days. No rx of nasal sprays. No cough meds rx'd on last.   Pt was one week into illness at that time.  Pt has had some chills over last week. Not getting better even though used augmentin. Rt ear mild pain on and off.    Review of Systems  Constitutional: Negative for chills, fatigue and fever.  HENT: Positive for congestion, sinus pressure and sinus pain. Negative for ear discharge and sore throat.   Respiratory: Positive for cough. Negative for chest tightness, shortness of breath and wheezing.   Cardiovascular: Negative for chest pain and palpitations.  Gastrointestinal: Negative for abdominal pain.  Genitourinary: Negative for dyspareunia.  Musculoskeletal: Negative for back pain and myalgias.  Neurological: Negative for dizziness and headaches.  Hematological: Negative for adenopathy. Does not bruise/bleed easily.  Psychiatric/Behavioral: Negative for behavioral problems and confusion.    Past Medical History:  Diagnosis Date  . Allergy   . Arthritis   . Basal cell carcinoma 2018   Removed from Right neck per patient      Social History   Socioeconomic History  . Marital status: Divorced    Spouse name: Not on file  . Number of children: Not on file  . Years of education: Not on file  . Highest education level: Not on file  Occupational History  . Not on file  Social Needs  . Financial resource strain: Not on file  . Food insecurity:    Worry: Not on file    Inability: Not on file  . Transportation needs:    Medical: Not on file    Non-medical: Not on file  Tobacco Use  . Smoking status: Current Every Day Smoker    Packs/day: 1.00    Years: 40.00    Pack years: 40.00  .  Smokeless tobacco: Never Used  . Tobacco comment: pt declines  Substance and Sexual Activity  . Alcohol use: No  . Drug use: No  . Sexual activity: Not Currently    Partners: Male    Birth control/protection: Surgical  Lifestyle  . Physical activity:    Days per week: Not on file    Minutes per session: Not on file  . Stress: Not on file  Relationships  . Social connections:    Talks on phone: Not on file    Gets together: Not on file    Attends religious service: Not on file    Active member of club or organization: Not on file    Attends meetings of clubs or organizations: Not on file    Relationship status: Not on file  . Intimate partner violence:    Fear of current or ex partner: Not on file    Emotionally abused: Not on file    Physically abused: Not on file    Forced sexual activity: Not on file  Other Topics Concern  . Not on file  Social History Narrative  . Not on file    Past Surgical History:  Procedure Laterality Date  . CESAREAN SECTION     X 3  . DILATION AND CURETTAGE OF UTERUS    . SHOULDER ARTHROSCOPY WITH OPEN ROTATOR CUFF REPAIR  Right 05/2009  . TONSILLECTOMY  1977  . TUBAL LIGATION      Family History  Problem Relation Age of Onset  . Diabetes Father   . Heart disease Father   . Hyperlipidemia Father     Allergies  Allergen Reactions  . Chantix [Varenicline] Other (See Comments)    Strange dreams/hallucinations  . Diclofenac Swelling  . Meloxicam Swelling  . Butrans [Buprenorphine] Itching    Itching and redness at the area of the patch.  Recardo Evangelist [Pregabalin]     Headache,severe eye pain, swelling  . Nsaids     Stomach pain  . Tolmetin Other (See Comments)    Stomach pain    Current Outpatient Medications on File Prior to Visit  Medication Sig Dispense Refill  . acetaminophen-codeine (TYLENOL #4) 300-60 MG tablet Take 1 tablet by mouth daily. 30 tablet 2  . amoxicillin-clavulanate (AUGMENTIN) 875-125 MG tablet Take 1 tablet by  mouth 2 (two) times daily. 20 tablet 0  . Calcium-Magnesium-Vitamin D (CALCIUM 500 PO) Take by mouth.    . cholecalciferol (VITAMIN D) 1000 units tablet Take 1,000 Units by mouth daily.    . diclofenac sodium (VOLTAREN) 1 % GEL Apply 2 g topically 4 (four) times daily. 3 Tube 1  . lovastatin (MEVACOR) 20 MG tablet TAKE 1 TABLET(20 MG) BY MOUTH AT BEDTIME 90 tablet 1  . mirabegron ER (MYRBETRIQ) 50 MG TB24 tablet Take by mouth.    . polyethylene glycol powder (GLYCOLAX/MIRALAX) powder Take 17 g 2 (two) times daily as needed by mouth. Use for constipation 3350 g 6   No current facility-administered medications on file prior to visit.     BP 122/68   Pulse 73   Temp 98.3 F (36.8 C) (Oral)   Resp 16   Ht 5' 8.5" (1.74 m)   Wt 216 lb 6.4 oz (98.2 kg)   SpO2 98%   BMI 32.42 kg/m       Objective:   Physical Exam  General  Mental Status - Alert. General Appearance - Well groomed. Not in acute distress.  Skin Rashes- No Rashes.  HEENT Head- Normal. Ear Auditory Canal - Left- Normal. Right - Normal.Tympanic Membrane- Left- Normal. Right- mild central redness. Eye Sclera/Conjunctiva- Left- Normal. Right- Normal. Nose & Sinuses Nasal Mucosa- Left-  Boggy and Congested. Right-  Boggy and  Congested.Bilateral maxillary and frontal sinus pressure. Mouth & Throat Lips: Upper Lip- Normal: no dryness, cracking, pallor, cyanosis, or vesicular eruption. Lower Lip-Normal: no dryness, cracking, pallor, cyanosis or vesicular eruption. Buccal Mucosa- Bilateral- No Aphthous ulcers. Oropharynx- No Discharge or Erythema. Tonsils: Characteristics- Bilateral- No Erythema or Congestion. Size/Enlargement- Bilateral- No enlargement. Discharge- bilateral-None.  Neck Neck- Supple. No Masses.   Chest and Lung Exam Auscultation: Breath Sounds:-Clear even and unlabored.  Cardiovascular Auscultation:Rythm- Regular, rate and rhythm. Murmurs & Other Heart Sounds:Ausculatation of the heart reveal- No  Murmurs.  Lymphatic Head & Neck General Head & Neck Lymphatics: Bilateral: Description- No Localized lymphadenopathy.       Assessment & Plan:   You appear to have persisting bronchitis and sinusitis. Rest hydrate and tylenol for fever. I am prescribing cough medicine benzonatate, and azithromycin antibiotic. For your nasal congestion rx flonase.  Rt om/ear early presently.   Chest xray today.  Rocephin 1 gram im today.  Follow up in 7-10 days or as needed  [employer], [employer]

## 2018-08-18 NOTE — Patient Instructions (Addendum)
You appear to have persisting  bronchitis and sinusitis. Rest hydrate and tylenol for fever. I am prescribing cough medicine benzonatate, and azithromycin antibiotic. For your nasal congestion rx flonase.  Rt om/ear early presently.   Chest xray today.  Rocephin 1 gram im today.  Follow up in 7-10 days or as needed

## 2018-10-06 ENCOUNTER — Encounter: Payer: Medicare Other | Attending: Registered Nurse | Admitting: Registered Nurse

## 2018-10-06 VITALS — BP 128/79 | HR 89 | Ht 68.5 in | Wt 215.0 lb

## 2018-10-06 DIAGNOSIS — G894 Chronic pain syndrome: Secondary | ICD-10-CM

## 2018-10-06 DIAGNOSIS — M1990 Unspecified osteoarthritis, unspecified site: Secondary | ICD-10-CM | POA: Insufficient documentation

## 2018-10-06 DIAGNOSIS — Z5181 Encounter for therapeutic drug level monitoring: Secondary | ICD-10-CM

## 2018-10-06 DIAGNOSIS — M545 Low back pain: Secondary | ICD-10-CM | POA: Insufficient documentation

## 2018-10-06 DIAGNOSIS — F17210 Nicotine dependence, cigarettes, uncomplicated: Secondary | ICD-10-CM | POA: Insufficient documentation

## 2018-10-06 DIAGNOSIS — Z85828 Personal history of other malignant neoplasm of skin: Secondary | ICD-10-CM | POA: Diagnosis not present

## 2018-10-06 DIAGNOSIS — M542 Cervicalgia: Secondary | ICD-10-CM

## 2018-10-06 DIAGNOSIS — M75101 Unspecified rotator cuff tear or rupture of right shoulder, not specified as traumatic: Secondary | ICD-10-CM | POA: Diagnosis not present

## 2018-10-06 DIAGNOSIS — M4802 Spinal stenosis, cervical region: Secondary | ICD-10-CM | POA: Diagnosis not present

## 2018-10-06 DIAGNOSIS — G4700 Insomnia, unspecified: Secondary | ICD-10-CM | POA: Insufficient documentation

## 2018-10-06 DIAGNOSIS — M25511 Pain in right shoulder: Secondary | ICD-10-CM | POA: Diagnosis not present

## 2018-10-06 DIAGNOSIS — Z79891 Long term (current) use of opiate analgesic: Secondary | ICD-10-CM

## 2018-10-06 DIAGNOSIS — Z760 Encounter for issue of repeat prescription: Secondary | ICD-10-CM | POA: Diagnosis not present

## 2018-10-06 DIAGNOSIS — G8929 Other chronic pain: Secondary | ICD-10-CM | POA: Diagnosis not present

## 2018-10-06 MED ORDER — ACETAMINOPHEN-CODEINE #4 300-60 MG PO TABS: 1.0000 | ORAL_TABLET | Freq: Every day | ORAL | 2 refills | Status: DC

## 2018-10-06 NOTE — Progress Notes (Signed)
Subjective:    Patient ID: Susan Cortez, female    DOB: June 20, 1961, 58 y.o.   MRN: [id number]  HPI: JANAYA BROY is a 58 y.o. female who returns for follow up appointment for chronic pain and medication refill. She states her pain is located in her neck radiating into her right shoulder and upper back. She rates her pain 7.Her  current exercise regime is walking and performing stretching exercises.  Ms. Rampy sister scheduled to have surgery and Ms. Philips will be leaving for [place] to care for her sister we will allow her to return for next schedule appointment in June, she verbalizes understanding.   Ms. Alper Morphine equivalent is 9.00 MME.  Last UDS was Performed on 05/29/2018, it was consistent.   Pain Inventory Average Pain 6 Pain Right Now 7 My pain is sharp, burning, stabbing, tingling and aching  In the last 24 hours, has pain interfered with the following? General activity 1 Relation with others 1 Enjoyment of life 2 What TIME of day is your pain at its worst? evening Sleep (in general) Fair  Pain is worse with: walking, standing and some activites Pain improves with: rest, pacing activities and medication Relief from Meds: 7  Mobility walk without assistance ability to climb steps?  yes do you drive?  yes  Function Do you have any goals in this area?  no  Neuro/Psych No problems in this area  Prior Studies Any changes since last visit?  no  Physicians involved in your care Any changes since last visit?  no   Family History  Problem Relation Age of Onset  . Diabetes Father   . Heart disease Father   . Hyperlipidemia Father    Social History   Socioeconomic History  . Marital status: Divorced    Spouse name: Not on file  . Number of children: Not on file  . Years of education: Not on file  . Highest education level: Not on file  Occupational History  . Not on file  Social Needs  . Financial resource strain: Not on file  . Food  insecurity:    Worry: Not on file    Inability: Not on file  . Transportation needs:    Medical: Not on file    Non-medical: Not on file  Tobacco Use  . Smoking status: Current Every Day Smoker    Packs/day: 1.00    Years: 40.00    Pack years: 40.00  . Smokeless tobacco: Never Used  . Tobacco comment: pt declines  Substance and Sexual Activity  . Alcohol use: No  . Drug use: No  . Sexual activity: Not Currently    Partners: Male    Birth control/protection: Surgical  Lifestyle  . Physical activity:    Days per week: Not on file    Minutes per session: Not on file  . Stress: Not on file  Relationships  . Social connections:    Talks on phone: Not on file    Gets together: Not on file    Attends religious service: Not on file    Active member of club or organization: Not on file    Attends meetings of clubs or organizations: Not on file    Relationship status: Not on file  Other Topics Concern  . Not on file  Social History Narrative  . Not on file   Past Surgical History:  Procedure Laterality Date  . CESAREAN SECTION     X 3  .  DILATION AND CURETTAGE OF UTERUS    . SHOULDER ARTHROSCOPY WITH OPEN ROTATOR CUFF REPAIR Right 05/2009  . TONSILLECTOMY  1977  . TUBAL LIGATION     Past Medical History:  Diagnosis Date  . Allergy   . Arthritis   . Basal cell carcinoma 2018   Removed from Right neck per patient    BP 128/79   Pulse 89   Ht 5' 8.5" (1.74 m)   Wt 215 lb (97.5 kg)   SpO2 97%   BMI 32.22 kg/m   Opioid Risk Score:   Fall Risk Score:  `1  Depression screen PHQ 2/9  Depression screen [facility] 2/9 03/07/2018 01/03/2018 06/26/2017 09/05/2016 04/03/2016 12/26/2015 05/05/2015  Decreased Interest 0 0 0 0 3 0 3  Down, Depressed, Hopeless 0 0 0 0 0 0 0  PHQ - 2 Score 0 0 0 0 3 0 3  Altered sleeping - - - - - - -  Tired, decreased energy - - - - - - -  Change in appetite - - - - - - -  Feeling bad or failure about yourself  - - - - - - -  Trouble concentrating - -  - - - - -  Moving slowly or fidgety/restless - - - - - - -  Suicidal thoughts - - - - - - -  PHQ-9 Score - - - - - - -     Review of Systems  Constitutional: Negative.   HENT: Negative.   Eyes: Negative.   Respiratory: Negative.   Cardiovascular: Negative.   Gastrointestinal: Negative.   Endocrine: Negative.   Genitourinary: Negative.   Musculoskeletal: Positive for arthralgias, myalgias and neck pain.  Skin: Negative.   Allergic/Immunologic: Negative.   Neurological: Negative.   Hematological: Negative.   Psychiatric/Behavioral: Negative.   All other systems reviewed and are negative.      Objective:   Physical Exam Vitals signs and nursing note reviewed.  Constitutional:      Appearance: Normal appearance.  Neck:     Musculoskeletal: Normal range of motion and neck supple.     Comments: Cervical Paraspinal Tenderness: C-5-C-6 Cardiovascular:     Rate and Rhythm: Normal rate and regular rhythm.     Pulses: Normal pulses.     Heart sounds: Normal heart sounds.  Pulmonary:     Effort: Pulmonary effort is normal.     Breath sounds: Normal breath sounds.  Musculoskeletal:     Comments: Normal Muscle Bulk and Muscle Testing Reveals:  Upper Extremities: Right: Decreased ROM 90 Degrees and Muscle Strength 5/5 Right AC Joint Tenderness Thoracic Paraspinal Tenderness: T-1-T-3 Mainly Right Side Lower Extremities: Full ROM and Muscle Strength 5/5 Left Lower Extremity Flexion Produces Pain into Patella Crepitus noted with flexion  Arises from chair with ease Narrow Based Gait   Skin:    General: Skin is warm and dry.  Neurological:     Mental Status: She is alert and oriented to person, place, and time.           Assessment & Plan:  1.Cervical spinal stenosis without evidence ofradiculopathy. Chronic neck pain: Refilled Tylenol #4 300/60mg  daily, may take an extra tablet when pain is sever. #45.10/06/2018 2. Right shoulder rotator cuff tear, status post repair  with chronic pain and contracture : Continue with exercise regimeas tolerated.10/06/2018 3. Insomnia:Continue Melatonin.10/06/2018 4. Chronic Bilateral Lower Back Pain without Sciatica: No complaints Today. Continue HEP as tolerated. Continue to Monitor.10/06/2018  15 minutes of face to face  patient care time was spent during this visit. All questions were encouraged and answered.   F/U 3 months

## 2018-10-07 ENCOUNTER — Encounter: Payer: Self-pay | Admitting: Registered Nurse

## 2018-10-09 ENCOUNTER — Other Ambulatory Visit (HOSPITAL_BASED_OUTPATIENT_CLINIC_OR_DEPARTMENT_OTHER): Payer: Self-pay | Admitting: Family Medicine

## 2018-10-09 DIAGNOSIS — Z1231 Encounter for screening mammogram for malignant neoplasm of breast: Secondary | ICD-10-CM

## 2018-10-21 DIAGNOSIS — L729 Follicular cyst of the skin and subcutaneous tissue, unspecified: Secondary | ICD-10-CM | POA: Diagnosis not present

## 2018-11-26 ENCOUNTER — Ambulatory Visit (HOSPITAL_BASED_OUTPATIENT_CLINIC_OR_DEPARTMENT_OTHER): Payer: Medicare Other

## 2018-12-09 NOTE — Progress Notes (Signed)
Susan Cortez at [name] [address] [place], [place] [address] [phone]  Date:  12/10/2018   Name:  Susan Cortez   DOB:  04-26-1961   MRN:  [id number]  PCP:  Darreld Mclean, MD    Chief Complaint: No chief complaint on file.   History of Present Illness:  Susan Cortez is a 58 y.o. very pleasant female patient who presents with the following:  Virtual visit today due to COVID-19 pandemic Patient location is home Provider location is office Patient identity confirmed with name and date of birth Consent given for virtual visit today  Susan Cortez has history of intermittent claudication, dyslipidemia, chronic back pain, cervical spinal stenosis, and adrenal adenoma She was seen in my office a couple of times in December with an ankle injury and sinusitis I most recently saw her in July for foot pain She is a patient of physical medicine and rehab for her chronic neck and back issues Per their most recent note, February Assessment & Plan:  1.Cervical spinal stenosis without evidence ofradiculopathy. Chronic neck pain: Refilled Tylenol #4 300/60mg  daily, may take an extra tablet when pain is sever. #45.10/06/2018 2. Right shoulder rotator cuff tear, status post repair with chronic pain and contracture : Continue with exercise regimeas tolerated.10/06/2018 3. Insomnia:Continue Melatonin.10/06/2018 4. Chronic Bilateral Lower Back Pain without Sciatica:No complaints Today.Continue HEP as tolerated. Continue to Monitor.10/06/2018  Tylenol No. 4 Voltaren gel Lovastatin Myrbetriq  Pap done in 2017, negative for HPV.  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 Dyslipidemia 03/01/2018  . De Quervain's disease (tenosynovitis) 03/28/2017  . Chronic bilateral low back pain without sciatica 03/28/2017  . Sebaceous hyperplasia of face 06/26/2016  . Chronic right shoulder pain 01/02/2016  . Adenoma of left adrenal gland 12/26/2015  . Intermittent claudication ([place]) 12/26/2015  . Rotator cuff (capsule) sprain 03/26/2014  . Spinal stenosis in cervical region 03/26/2014  . Chronic pain 03/26/2014    Past Medical History:  Diagnosis Date  . Allergy   . Arthritis   . Basal cell carcinoma 2018   Removed from Right neck per patient     Past Surgical History:  Procedure Laterality Date  . CESAREAN SECTION     X 3  . DILATION AND CURETTAGE OF UTERUS    . SHOULDER ARTHROSCOPY WITH OPEN ROTATOR CUFF REPAIR Right 05/2009  . TONSILLECTOMY  1977  . TUBAL LIGATION      Social History   Tobacco Use  . Smoking status: Current Every Day Smoker    Packs/day: 1.00    Years: 40.00    Pack years: 40.00  . Smokeless tobacco: Never Used  . Tobacco comment: pt declines  Substance Use Topics  . Alcohol use: No  . Drug use: No    Family History  Problem Relation Age of Onset  . Diabetes Father   . Heart disease Father   . Hyperlipidemia Father     Allergies  Allergen Reactions  . Chantix [  Varenicline] Other (See Comments)    Strange dreams/hallucinations  . Diclofenac Swelling  . Meloxicam Swelling  . Butrans [Buprenorphine] Itching    Itching and redness at the area of the patch.  Recardo Evangelist [Pregabalin]     Headache,severe eye pain, swelling  . Nsaids     Stomach pain  . Tolmetin Other (See Comments)    Stomach pain    Medication list has been reviewed and updated.  Current Outpatient Medications on File Prior to Visit  Medication Sig Dispense Refill  . acetaminophen-codeine (TYLENOL #4) 300-60 MG  tablet Take 1 tablet by mouth daily. 30 tablet 2  . amoxicillin-clavulanate (AUGMENTIN) 875-125 MG tablet Take 1 tablet by mouth 2 (two) times daily. 20 tablet 0  . azithromycin (ZITHROMAX) 250 MG tablet Take 2 tablets by mouth on day 1, followed by 1 tablet by mouth daily for 4 days. 6 tablet 0  . benzonatate (TESSALON) 100 MG capsule Take 1 capsule (100 mg total) by mouth 3 (three) times daily as needed for cough. 30 capsule 0  . Calcium-Magnesium-Vitamin D (CALCIUM 500 PO) Take by mouth.    . cholecalciferol (VITAMIN D) 1000 units tablet Take 1,000 Units by mouth daily.    . diclofenac sodium (VOLTAREN) 1 % GEL Apply 2 g topically 4 (four) times daily. 3 Tube 1  . fluticasone (FLONASE) 50 MCG/ACT nasal spray Place 2 sprays into both nostrils daily. 16 g 1  . lovastatin (MEVACOR) 20 MG tablet TAKE 1 TABLET(20 MG) BY MOUTH AT BEDTIME 90 tablet 1  . mirabegron ER (MYRBETRIQ) 50 MG TB24 tablet Take by mouth.    . polyethylene glycol powder (GLYCOLAX/MIRALAX) powder Take 17 g 2 (two) times daily as needed by mouth. Use for constipation 3350 g 6   No current facility-administered medications on file prior to visit.     Review of Systems:  As per HPI- otherwise negative. No fever chills  Physical Examination: There were no vitals filed for this visit. There were no vitals filed for this visit. There is no height or weight on file to calculate BMI. Ideal Body Weight:   She is not checking her BP at home, but notes that it has been ok when checked at MD office  Patient observed on camera today.  She looks well, her normal self.  No cough, wheezing, tachypnea or distress is noted   Assessment and Plan: Dyslipidemia - Plan: lovastatin (MEVACOR) 20 MG tablet  Urinary frequency - Plan: mirabegron ER (MYRBETRIQ) 50 MG TB24 tablet  Follow-up visit today.  Refilled medications which are due Patient is also due for a Pap and routine labs, and Shingrix vaccine.  We plan to see her later on this  summer assuming that the pandemic has passed. Welcomed any other questions, patient has no further concerns and will plan to see me later this year  Summary and instructions sent to patient on my chart  It was great to talk with you today!    Please come and see me when feasible, we can do your Pap, labs, and shingles vaccine at that time Let us plan to visit in June or July, hopefully by then the pandemic will have passed  Let me know if you need anything in the meantime  Signed Lamar Blinks, MD

## 2018-12-09 NOTE — Patient Instructions (Addendum)
It was great to talk with you today!    Please come and see me when feasible, we can do your Pap, labs, and shingles vaccine at that time Let us plan to visit in June or July, hopefully by then the pandemic will have passed  Let me know if you need anything in the meantime

## 2018-12-10 ENCOUNTER — Ambulatory Visit (INDEPENDENT_AMBULATORY_CARE_PROVIDER_SITE_OTHER): Payer: Medicare Other | Admitting: Family Medicine

## 2018-12-10 ENCOUNTER — Encounter: Payer: Self-pay | Admitting: Family Medicine

## 2018-12-10 ENCOUNTER — Encounter: Payer: PPO | Admitting: Family Medicine

## 2018-12-10 ENCOUNTER — Other Ambulatory Visit: Payer: Self-pay

## 2018-12-10 DIAGNOSIS — E785 Hyperlipidemia, unspecified: Secondary | ICD-10-CM

## 2018-12-10 DIAGNOSIS — R350 Frequency of micturition: Secondary | ICD-10-CM | POA: Diagnosis not present

## 2018-12-10 MED ORDER — MIRABEGRON ER 50 MG PO TB24: 50.0000 mg | ORAL_TABLET | Freq: Every day | ORAL | 3 refills | Status: DC

## 2018-12-10 MED ORDER — LOVASTATIN 20 MG PO TABS: 20.0000 mg | ORAL_TABLET | Freq: Every day | ORAL | 3 refills | Status: DC

## 2018-12-27 ENCOUNTER — Other Ambulatory Visit: Payer: Self-pay | Admitting: Family Medicine

## 2018-12-27 DIAGNOSIS — E785 Hyperlipidemia, unspecified: Secondary | ICD-10-CM

## 2018-12-29 ENCOUNTER — Encounter: Payer: Medicare Other | Attending: Registered Nurse | Admitting: Physical Medicine & Rehabilitation

## 2018-12-29 ENCOUNTER — Other Ambulatory Visit: Payer: Self-pay

## 2018-12-29 ENCOUNTER — Encounter: Payer: Self-pay | Admitting: Physical Medicine & Rehabilitation

## 2018-12-29 VITALS — Ht 68.0 in | Wt 216.0 lb

## 2018-12-29 DIAGNOSIS — M1990 Unspecified osteoarthritis, unspecified site: Secondary | ICD-10-CM | POA: Insufficient documentation

## 2018-12-29 DIAGNOSIS — M4802 Spinal stenosis, cervical region: Secondary | ICD-10-CM | POA: Diagnosis not present

## 2018-12-29 DIAGNOSIS — M75101 Unspecified rotator cuff tear or rupture of right shoulder, not specified as traumatic: Secondary | ICD-10-CM | POA: Insufficient documentation

## 2018-12-29 DIAGNOSIS — M25559 Pain in unspecified hip: Secondary | ICD-10-CM

## 2018-12-29 DIAGNOSIS — G8929 Other chronic pain: Secondary | ICD-10-CM | POA: Insufficient documentation

## 2018-12-29 DIAGNOSIS — Z760 Encounter for issue of repeat prescription: Secondary | ICD-10-CM | POA: Insufficient documentation

## 2018-12-29 DIAGNOSIS — M545 Low back pain: Secondary | ICD-10-CM | POA: Insufficient documentation

## 2018-12-29 DIAGNOSIS — Z85828 Personal history of other malignant neoplasm of skin: Secondary | ICD-10-CM | POA: Insufficient documentation

## 2018-12-29 DIAGNOSIS — G4700 Insomnia, unspecified: Secondary | ICD-10-CM | POA: Insufficient documentation

## 2018-12-29 DIAGNOSIS — F17210 Nicotine dependence, cigarettes, uncomplicated: Secondary | ICD-10-CM | POA: Insufficient documentation

## 2018-12-29 MED ORDER — ACETAMINOPHEN-CODEINE #4 300-60 MG PO TABS: 1.0000 | ORAL_TABLET | Freq: Every day | ORAL | 2 refills | Status: DC

## 2018-12-29 NOTE — Patient Instructions (Signed)

## 2018-12-29 NOTE — Addendum Note (Signed)
Addended by: Charlett Blake on: 12/29/2018 11:37 AM   Modules accepted: Orders

## 2018-12-29 NOTE — Progress Notes (Signed)
Subjective:    Patient ID: Susan Cortez, female    DOB: [birth year], 59 y.o.   MRN: [id number] Patient consents to WebEx visit with audio and video operational HPI 58 year old female with history of a work-related injury in 2009.  She was working in the radiology department mainly on a computer with her neck in a flexed position for prolonged period of time. She was diagnosed with degenerative disc C5-6 and C6-7.  She also had foraminal stenosis at C6/C7.  Patient was seen by an orthopedic surgeon but no surgery was recommended. The patient does not have any significant radicular pain.  Her pain is mainly in her neck.  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  The patient also has a new complaint of hip pain.  She indicates this is the lateral aspect of her hip.  She actually had a similar episode a few years ago and had excellent benefit from hip injection.  She has not tried nonsteroidal anti-inflammatories but she is able to take these medications.  She has not tried any type of exercise other than walking. Pain Inventory Average Pain 7 Pain Right Now 7 My pain is constant, burning, stabbing and aching  In the last 24 hours, has pain interfered with the following? General activity 0 Relation with others 0 Enjoyment of life 0 What TIME of day is your pain at its worst? morning Sleep (in general) Poor  Pain is worse with: sitting, standing and some activites Pain improves with: rest, heat/ice and medication Relief from Meds: 5  Mobility walk without assistance how many minutes can you walk? 30 ability to climb steps?  yes do you drive?  yes  Function disabled: date disabled na  Neuro/Psych bladder control problems  Prior Studies Any changes since last visit?  no  Physicians involved in your care Primary care Dr. Janett Billow Copland   Family History  Problem Relation Age of Onset  . Diabetes  Father   . Heart disease Father   . Hyperlipidemia Father    Social History   Socioeconomic History  . Marital status: Divorced    Spouse name: Not on file  . Number of children: Not on file  . Years of education: Not on file  . Highest education level: Not on file  Occupational History  . Not on file  Social Needs  . Financial resource strain: Not on file  . Food insecurity:    Worry: Not on file    Inability: Not on file  . Transportation needs:    Medical: Not on file    Non-medical: Not on file  Tobacco Use  . Smoking status: Current Every Day Smoker    Packs/day: 1.00    Years: 40.00    Pack years: 40.00  . Smokeless tobacco: Never Used  . Tobacco comment: pt declines  Substance and Sexual Activity  . Alcohol use: No  . Drug use: No  . Sexual activity: Not Currently    Partners: Male    Birth control/protection: Surgical  Lifestyle  . Physical activity:    Days per week: Not on file    Minutes per session: Not on file  . Stress: Not on file  Relationships  . Social connections:    Talks on phone: Not on file    Gets together: Not on file    Attends religious service: Not on file    Active member of club or organization:  Not on file    Attends meetings of clubs or organizations: Not on file    Relationship status: Not on file  Other Topics Concern  . Not on file  Social History Narrative  . Not on file   Past Surgical History:  Procedure Laterality Date  . CESAREAN SECTION     X 3  . DILATION AND CURETTAGE OF UTERUS    . SHOULDER ARTHROSCOPY WITH OPEN ROTATOR CUFF REPAIR Right 05/2009  . TONSILLECTOMY  1977  . TUBAL LIGATION     Past Medical History:  Diagnosis Date  . Allergy   . Arthritis   . Basal cell carcinoma 2018   Removed from Right neck per patient    Ht 5\' 8"  (1.727 m)   Wt 216 lb (98 kg)   BMI 32.84 kg/m   Opioid Risk Score:   Fall Risk Score:  `1  Depression screen PHQ 2/9  Depression screen [facility] 2/9 12/29/2018 03/07/2018  01/03/2018 06/26/2017 09/05/2016 04/03/2016 12/26/2015  Decreased Interest 0 0 0 0 0 3 0  Down, Depressed, Hopeless 0 0 0 0 0 0 0  PHQ - 2 Score 0 0 0 0 0 3 0  Altered sleeping - - - - - - -  Tired, decreased energy - - - - - - -  Change in appetite - - - - - - -  Feeling bad or failure about yourself  - - - - - - -  Trouble concentrating - - - - - - -  Moving slowly or fidgety/restless - - - - - - -  Suicidal thoughts - - - - - - -  PHQ-9 Score - - - - - - -   Review of Systems  Constitutional: Negative.   HENT: Negative.   Eyes: Negative.   Respiratory: Negative.   Cardiovascular: Negative.   Gastrointestinal: Negative.   Endocrine: Negative.   Genitourinary: Negative.   Musculoskeletal: Positive for neck pain.       Shoulder pain Hip pain  Skin: Negative.   Allergic/Immunologic: Negative.   Neurological: Negative.   Hematological: Negative.   Psychiatric/Behavioral: Negative.   All other systems reviewed and are negative.      Objective:   Physical Exam Patient has reduced lateral rotation to the left side as well as lateral bending to the left side approximately 75% of normal has normal range on the right side.  She has normal flexion and extension of the cervical spine. Her shoulder range of motion is normal.  She is able to put her hands behind her head. No evidence of facial droop Speech without evidence of dysarthria or aphasia Orientation x3 Memory for medical history is intact. Remainder of examination is deferred secondary to WebEx visit      Assessment & Plan:  1.  Cervical degenerative disc with cervical stenosis no significant radicular component.  She gets good relief with Tylenol with codeine 1 tablet daily.  We will renew with 2 refills PDMP reviewed, last urine toxicology 05/29/2018 will need another 1 next visit Nurse practitioner visit in 3 months  2.  Lateral hip pain no trauma.  She has been more sedentary with the COVID restrictions.  Will give  patient exercises for trochanteric bursitis.  Also recommend ibuprofen 600 mg 3 times daily with food for 1 week, if these are not helpful would recommend troches bursa injection.  Patient will call and let us know whether she needs to schedule this  Duration of visit 19min

## 2019-01-12 ENCOUNTER — Other Ambulatory Visit: Payer: Self-pay

## 2019-01-12 ENCOUNTER — Ambulatory Visit (HOSPITAL_BASED_OUTPATIENT_CLINIC_OR_DEPARTMENT_OTHER)
Admission: RE | Admit: 2019-01-12 | Discharge: 2019-01-12 | Disposition: A | Discharge status: Home / Self Care | Payer: Medicare Other | Source: Ambulatory Visit | Attending: Family Medicine | Admitting: Family Medicine

## 2019-01-12 ENCOUNTER — Ambulatory Visit (HOSPITAL_BASED_OUTPATIENT_CLINIC_OR_DEPARTMENT_OTHER): Payer: Medicare Other

## 2019-01-12 ENCOUNTER — Encounter (HOSPITAL_BASED_OUTPATIENT_CLINIC_OR_DEPARTMENT_OTHER): Payer: Self-pay

## 2019-01-12 DIAGNOSIS — Z1231 Encounter for screening mammogram for malignant neoplasm of breast: Secondary | ICD-10-CM | POA: Insufficient documentation

## 2019-01-14 ENCOUNTER — Encounter: Payer: PPO | Admitting: Family Medicine

## 2019-01-30 ENCOUNTER — Ambulatory Visit: Payer: Medicare Other | Admitting: Physical Medicine & Rehabilitation

## 2019-02-02 DIAGNOSIS — H2513 Age-related nuclear cataract, bilateral: Secondary | ICD-10-CM | POA: Diagnosis not present

## 2019-02-02 DIAGNOSIS — H524 Presbyopia: Secondary | ICD-10-CM | POA: Diagnosis not present

## 2019-03-06 NOTE — Progress Notes (Addendum)
Virtual Visit via Video Note  I connected with patient on 03/09/19 at 11:00 AM EDT by audio enabled telemedicine application and verified that I am speaking with the correct person using two identifiers.   THIS ENCOUNTER IS A VIRTUAL VISIT DUE TO COVID-19 - PATIENT WAS NOT SEEN IN THE OFFICE. PATIENT HAS CONSENTED TO VIRTUAL VISIT / TELEMEDICINE VISIT   Location of patient: home  Location of provider: office  I discussed the limitations of evaluation and management by telemedicine and the availability of in person appointments. The patient expressed understanding and agreed to proceed.   Subjective:   Susan Cortez is a 58 y.o. female who presents for Medicare Annual (Subsequent) preventive examination.  Review of Systems: No ROS.  Medicare Wellness Virtual Visit.  Visual/audio telehealth visit, UTA vital signs.   See social history for additional risk factors. Cardiac Risk Factors include: advanced age (>39men, >46 women);dyslipidemia Sleep patterns:  No issues Home Safety/Smoke Alarms: Feels safe in home. Smoke alarms in place.  Lives alone in 1st flr condo   Female:   Pap-  declines Mammo-01/12/19           CCS- pt reports last 11/2017 w/ 3 yr recall     Objective:    Advanced Directives 03/09/2019 03/07/2018 06/26/2017 03/28/2017 09/05/2016 04/03/2016 01/02/2016  Does Patient Have a Medical Advance Directive? No No No No No No No  Would patient like information on creating a medical advance directive? No - Patient declined Yes (MAU/Ambulatory/Procedural Areas - Information given) - - Yes (MAU/Ambulatory/Procedural Areas - Information given) No - patient declined information -    Tobacco Social History   Tobacco Use  Smoking Status Current Every Day Smoker  . Packs/day: 1.00  . Years: 40.00  . Pack years: 40.00  Smokeless Tobacco Never Used  Tobacco Comment   pt declines     Ready to quit: No Counseling given: No Comment: pt declines   Clinical Intake:     Pain :  No/denies pain    Past Medical History:  Diagnosis Date  . Allergy   . Arthritis   . Basal cell carcinoma 2018   Removed from Right neck per patient    Past Surgical History:  Procedure Laterality Date  . CESAREAN SECTION     X 3  . DILATION AND CURETTAGE OF UTERUS    . SHOULDER ARTHROSCOPY WITH OPEN ROTATOR CUFF REPAIR Right 05/2009  . TONSILLECTOMY  1977  . TUBAL LIGATION     Family History  Problem Relation Age of Onset  . Diabetes Father   . Heart disease Father   . Hyperlipidemia Father    Social History   Socioeconomic History  . Marital status: Divorced    Spouse name: Not on file  . Number of children: Not on file  . Years of education: Not on file  . Highest education level: Not on file  Occupational History  . Not on file  Social Needs  . Financial resource strain: Not on file  . Food insecurity    Worry: Not on file    Inability: Not on file  . Transportation needs    Medical: Not on file    Non-medical: Not on file  Tobacco Use  . Smoking status: Current Every Day Smoker    Packs/day: 1.00    Years: 40.00    Pack years: 40.00  . Smokeless tobacco: Never Used  . Tobacco comment: pt declines  Substance and Sexual Activity  . Alcohol use: No  .  Drug use: No  . Sexual activity: Not Currently    Partners: Male    Birth control/protection: Surgical  Lifestyle  . Physical activity    Days per week: Not on file    Minutes per session: Not on file  . Stress: Not on file  Relationships  . Social Herbalist on phone: Not on file    Gets together: Not on file    Attends religious service: Not on file    Active member of club or organization: Not on file    Attends meetings of clubs or organizations: Not on file    Relationship status: Not on file  Other Topics Concern  . Not on file  Social History Narrative  . Not on file    Outpatient Encounter Medications as of 03/09/2019  Medication Sig  . acetaminophen-codeine (TYLENOL #4)  300-60 MG tablet Take 1 tablet by mouth daily.  . Calcium-Magnesium-Vitamin D (CALCIUM 500 PO) Take by mouth.  . cholecalciferol (VITAMIN D) 1000 units tablet Take 1,000 Units by mouth daily.  Marland Kitchen lovastatin (MEVACOR) 20 MG tablet TAKE 1 TABLET(20 MG) BY MOUTH AT BEDTIME  . mirabegron ER (MYRBETRIQ) 50 MG TB24 tablet Take 1 tablet (50 mg total) by mouth daily.  . polyethylene glycol powder (GLYCOLAX/MIRALAX) powder Take 17 g 2 (two) times daily as needed by mouth. Use for constipation  . [DISCONTINUED] benzonatate (TESSALON) 100 MG capsule Take 1 capsule (100 mg total) by mouth 3 (three) times daily as needed for cough.  . [DISCONTINUED] diclofenac sodium (VOLTAREN) 1 % GEL Apply 2 g topically 4 (four) times daily.  . [DISCONTINUED] fluticasone (FLONASE) 50 MCG/ACT nasal spray Place 2 sprays into both nostrils daily.   No facility-administered encounter medications on file as of 03/09/2019.     Activities of Daily Living In your present state of health, do you have any difficulty performing the following activities: 03/09/2019  Hearing? N  Vision? N  Difficulty concentrating or making decisions? N  Walking or climbing stairs? N  Dressing or bathing? N  Doing errands, shopping? N  Preparing Food and eating ? N  Using the Toilet? N  In the past six months, have you accidently leaked urine? N  Do you have problems with loss of bowel control? N  Managing your Medications? N  Managing your Finances? N  Housekeeping or managing your Housekeeping? N  Some recent data might be hidden    Patient Care Team: Copland, Gay Filler, MD as PCP - General (Family Medicine) Kirsteins, Luanna Salk, MD as Consulting Physician (Pain Medicine) Jola Schmidt, MD as Consulting Physician (Ophthalmology)    Assessment:   This is a routine wellness examination for Susan Cortez. Physical assessment deferred to PCP.  Exercise Activities and Dietary recommendations Current Exercise Habits: The patient does not participate  in regular exercise at present, Exercise limited by: None identified Diet (meal preparation, eat out, water intake, caffeinated beverages, dairy products, fruits and vegetables): well balanced, on average, 2 meals per day   Goals    . Quit smoking / using tobacco    . Weight (lb) < 200 lb (90.7 kg)       Fall Risk Fall Risk  03/09/2019 12/29/2018 10/06/2018 05/29/2018 03/24/2018  Falls in the past year? 0 0 0 No No  Comment - - - - -  Number falls in past yr: - - - - -  Injury with Fall? - - - - -  Comment - - - - -  Risk for fall due to : - - - - -  Follow up - - - - -   Depression Screen PHQ 2/9 Scores 03/09/2019 12/29/2018 03/07/2018 01/03/2018  PHQ - 2 Score 0 0 0 0  PHQ- 9 Score - - - -  Exception Documentation - - - -     Cognitive Function Ad8 score reviewed for issues:  Issues making decisions: no  Less interest in hobbies / activities:no  Repeats questions, stories (family complaining):no  Trouble using ordinary gadgets (microwave, computer, phone):no  Forgets the month or year: no  Mismanaging finances: no  Remembering appts:no  Daily problems with thinking and/or memory:no Ad8 score is=0     MMSE - Mini Mental State Exam 03/07/2018 09/05/2016  Orientation to time 5 5  Orientation to Place 5 5  Registration 3 3  Attention/ Calculation 5 5  Recall 3 3  Language- name 2 objects 2 2  Language- repeat 1 1  Language- follow 3 step command 3 3  Language- read & follow direction 1 1  Write a sentence 1 1  Copy design 1 1  Total score 30 30        Immunization History  Administered Date(s) Administered  . Influenza,inj,Quad PF,6+ Mos 06/11/2018  . Tdap 08/28/2011   Screening Tests Health Maintenance  Topic Date Due  . HIV Screening  10/10/1974  . PAP SMEAR-Modifier  12/26/2018  . INFLUENZA VACCINE  03/28/2019  . MAMMOGRAM  01/11/2021  . TETANUS/TDAP  08/27/2021  . COLONOSCOPY  11/16/2027  . Hepatitis C Screening  Completed      Plan:   See you  next year!  Continue to eat heart healthy diet (full of fruits, vegetables, whole grains, lean protein, water--limit salt, fat, and sugar intake) and increase physical activity as tolerated.  Continue doing brain stimulating activities (puzzles, reading, adult coloring books, staying active) to keep memory sharp.     I have personally reviewed and noted the following in the patient's chart:   . Medical and social history . Use of alcohol, tobacco or illicit drugs  . Current medications and supplements . Functional ability and status . Nutritional status . Physical activity . Advanced directives . List of other physicians . Hospitalizations, surgeries, and ER visits in previous 12 months . Vitals . Screenings to include cognitive, depression, and falls . Referrals and appointments  In addition, I have reviewed and discussed with patient certain preventive protocols, quality metrics, and best practice recommendations. A written personalized care plan for preventive services as well as general preventive health recommendations were provided to patient.     Susan Cortez, [place]  03/09/2019   I have reviewed the above MWE note by Ms. Vevelyn Royals and agree with her documentation Denny Peon MD

## 2019-03-09 ENCOUNTER — Other Ambulatory Visit: Payer: Self-pay

## 2019-03-09 ENCOUNTER — Encounter: Payer: Self-pay | Admitting: *Deleted

## 2019-03-09 ENCOUNTER — Ambulatory Visit (INDEPENDENT_AMBULATORY_CARE_PROVIDER_SITE_OTHER): Payer: Medicare Other | Admitting: *Deleted

## 2019-03-09 DIAGNOSIS — Z0000 Encounter for general adult medical examination without abnormal findings: Secondary | ICD-10-CM | POA: Diagnosis not present

## 2019-03-09 NOTE — Patient Instructions (Signed)
See you next year!  Continue to eat heart healthy diet (full of fruits, vegetables, whole grains, lean protein, water--limit salt, fat, and sugar intake) and increase physical activity as tolerated.  Continue doing brain stimulating activities (puzzles, reading, adult coloring books, staying active) to keep memory sharp.    Susan Cortez , Thank you for taking time to come for your Medicare Wellness Visit. I appreciate your ongoing commitment to your health goals. Please review the following plan we discussed and let me know if I can assist you in the future.   These are the goals we discussed: Goals    . Quit smoking / using tobacco    . Weight (lb) < 200 lb (90.7 kg)       This is a list of the screening recommended for you and due dates:  Health Maintenance  Topic Date Due  . HIV Screening  10/10/1974  . Pap Smear  12/26/2018  . Flu Shot  03/28/2019  . Mammogram  01/11/2021  . Tetanus Vaccine  08/27/2021  . Colon Cancer Screening  11/16/2027  .  Hepatitis C: One time screening is recommended by Center for Disease Control  (CDC) for  adults born from [birth year] through 1965.   Completed    Health Maintenance After Age 49 After age 66, you are at a higher risk for certain long-term diseases and infections as well as injuries from falls. Falls are a major cause of broken bones and head injuries in people who are older than age 56. Getting regular preventive care can help to keep you healthy and well. Preventive care includes getting regular testing and making lifestyle changes as recommended by your health care provider. Talk with your health care provider about:  Which screenings and tests you should have. A screening is a test that checks for a disease when you have no symptoms.  A diet and exercise plan that is right for you. What should I know about screenings and tests to prevent falls? Screening and testing are the best ways to find a health problem early. Early diagnosis and  treatment give you the best chance of managing medical conditions that are common after age 61. Certain conditions and lifestyle choices may make you more likely to have a fall. Your health care provider may recommend:  Regular vision checks. Poor vision and conditions such as cataracts can make you more likely to have a fall. If you wear glasses, make sure to get your prescription updated if your vision changes.  Medicine review. Work with your health care provider to regularly review all of the medicines you are taking, including over-the-counter medicines. Ask your health care provider about any side effects that may make you more likely to have a fall. Tell your health care provider if any medicines that you take make you feel dizzy or sleepy.  Osteoporosis screening. Osteoporosis is a condition that causes the bones to get weaker. This can make the bones weak and cause them to break more easily.  Blood pressure screening. Blood pressure changes and medicines to control blood pressure can make you feel dizzy.  Strength and balance checks. Your health care provider may recommend certain tests to check your strength and balance while standing, walking, or changing positions.  Foot health exam. Foot pain and numbness, as well as not wearing proper footwear, can make you more likely to have a fall.  Depression screening. You may be more likely to have a fall if you have a fear of  falling, feel emotionally low, or feel unable to do activities that you used to do.  Alcohol use screening. Using too much alcohol can affect your balance and may make you more likely to have a fall. What actions can I take to lower my risk of falls? General instructions  Talk with your health care provider about your risks for falling. Tell your health care provider if: ? You fall. Be sure to tell your health care provider about all falls, even ones that seem minor. ? You feel dizzy, sleepy, or off-balance.  Take  over-the-counter and prescription medicines only as told by your health care provider. These include any supplements.  Eat a healthy diet and maintain a healthy weight. A healthy diet includes low-fat dairy products, low-fat (lean) meats, and fiber from whole grains, beans, and lots of fruits and vegetables. Home safety  Remove any tripping hazards, such as rugs, cords, and clutter.  Install safety equipment such as grab bars in bathrooms and safety rails on stairs.  Keep rooms and walkways well-lit. Activity   Follow a regular exercise program to stay fit. This will help you maintain your balance. Ask your health care provider what types of exercise are appropriate for you.  If you need a cane or walker, use it as recommended by your health care provider.  Wear supportive shoes that have nonskid soles. Lifestyle  Do not drink alcohol if your health care provider tells you not to drink.  If you drink alcohol, limit how much you have: ? 0-1 drink a day for women. ? 0-2 drinks a day for men.  Be aware of how much alcohol is in your drink. In the [place] one drink equals one typical bottle of beer (12 oz), one-half glass of wine (5 oz), or one shot of hard liquor (1 oz).  Do not use any products that contain nicotine or tobacco, such as cigarettes and e-cigarettes. If you need help quitting, ask your health care provider. Summary  Having a healthy lifestyle and getting preventive care can help to protect your health and wellness after age 38.  Screening and testing are the best way to find a health problem early and help you avoid having a fall. Early diagnosis and treatment give you the best chance for managing medical conditions that are more common for people who are older than age 55.  Falls are a major cause of broken bones and head injuries in people who are older than age 87. Take precautions to prevent a fall at home.  Work with your health care provider to learn what changes  you can make to improve your health and wellness and to prevent falls. This information is not intended to replace advice given to you by your health care provider. Make sure you discuss any questions you have with your health care provider. Document Released: 06/26/2017 Document Revised: 12/04/2018 Document Reviewed: 06/26/2017 Elsevier Patient Education  2020 [employer].

## 2019-03-30 ENCOUNTER — Other Ambulatory Visit: Payer: Self-pay

## 2019-03-30 ENCOUNTER — Encounter: Payer: Medicare Other | Admitting: Registered Nurse

## 2019-03-30 ENCOUNTER — Encounter: Payer: Self-pay | Admitting: Registered Nurse

## 2019-03-30 ENCOUNTER — Encounter: Payer: Medicare Other | Attending: Registered Nurse | Admitting: Registered Nurse

## 2019-03-30 VITALS — BP 134/94 | HR 85 | Temp 97.2°F | Resp 16 | Ht 68.0 in | Wt 219.2 lb

## 2019-03-30 DIAGNOSIS — M25511 Pain in right shoulder: Secondary | ICD-10-CM | POA: Diagnosis not present

## 2019-03-30 DIAGNOSIS — M7061 Trochanteric bursitis, right hip: Secondary | ICD-10-CM | POA: Diagnosis not present

## 2019-03-30 DIAGNOSIS — M1990 Unspecified osteoarthritis, unspecified site: Secondary | ICD-10-CM | POA: Diagnosis not present

## 2019-03-30 DIAGNOSIS — G8929 Other chronic pain: Secondary | ICD-10-CM

## 2019-03-30 DIAGNOSIS — Z760 Encounter for issue of repeat prescription: Secondary | ICD-10-CM | POA: Insufficient documentation

## 2019-03-30 DIAGNOSIS — Z79891 Long term (current) use of opiate analgesic: Secondary | ICD-10-CM

## 2019-03-30 DIAGNOSIS — M4802 Spinal stenosis, cervical region: Secondary | ICD-10-CM | POA: Diagnosis not present

## 2019-03-30 DIAGNOSIS — Z85828 Personal history of other malignant neoplasm of skin: Secondary | ICD-10-CM | POA: Insufficient documentation

## 2019-03-30 DIAGNOSIS — M545 Low back pain: Secondary | ICD-10-CM | POA: Diagnosis not present

## 2019-03-30 DIAGNOSIS — M546 Pain in thoracic spine: Secondary | ICD-10-CM

## 2019-03-30 DIAGNOSIS — M75101 Unspecified rotator cuff tear or rupture of right shoulder, not specified as traumatic: Secondary | ICD-10-CM | POA: Diagnosis not present

## 2019-03-30 DIAGNOSIS — F17210 Nicotine dependence, cigarettes, uncomplicated: Secondary | ICD-10-CM | POA: Insufficient documentation

## 2019-03-30 DIAGNOSIS — M542 Cervicalgia: Secondary | ICD-10-CM

## 2019-03-30 DIAGNOSIS — Z5181 Encounter for therapeutic drug level monitoring: Secondary | ICD-10-CM | POA: Diagnosis not present

## 2019-03-30 DIAGNOSIS — G4700 Insomnia, unspecified: Secondary | ICD-10-CM | POA: Diagnosis not present

## 2019-03-30 DIAGNOSIS — G894 Chronic pain syndrome: Secondary | ICD-10-CM

## 2019-03-30 MED ORDER — ACETAMINOPHEN-CODEINE #4 300-60 MG PO TABS: 1.0000 | ORAL_TABLET | Freq: Every day | ORAL | 2 refills | Status: DC

## 2019-03-30 NOTE — Progress Notes (Signed)
Subjective:    Patient ID: Susan Cortez, female    DOB: 1961/06/28, 58 y.o.   MRN: [id number]  HPI: Susan Cortez is a 58 y.o. female who returns for follow up appointment for chronic pain and medication refill. She states her pain is located in her neck, right shoulder, upper back mainly right sided and right hip. She rates her  Pain 6. Her  current exercise regime is walking.   Susan Cortez Morphine equivalent is 9.00 MME.  Last UDS was Performed on 05/29/2018, it was consistent. UDS ordered today.  Pain Inventory Average Pain 7 Pain Right Now 6 My pain is burning, dull and aching  In the last 24 hours, has pain interfered with the following? General activity 1 Relation with others 0 Enjoyment of life 1 What TIME of day is your pain at its worst? all Sleep (in general) Poor  Pain is worse with: walking, standing and some activites Pain improves with: rest, heat/ice, pacing activities and medication Relief from Meds: 7  Mobility walk without assistance how many minutes can you walk? 30 ability to climb steps?  yes do you drive?  yes  Function disabled: date disabled 2010  Neuro/Psych No problems in this area  Prior Studies Any changes since last visit?  no  Physicians involved in your care Any changes since last visit?  no   Family History  Problem Relation Age of Onset  . Diabetes Father   . Heart disease Father   . Hyperlipidemia Father    Social History   Socioeconomic History  . Marital status: Divorced    Spouse name: Not on file  . Number of children: Not on file  . Years of education: Not on file  . Highest education level: Not on file  Occupational History  . Not on file  Social Needs  . Financial resource strain: Not on file  . Food insecurity    Worry: Not on file    Inability: Not on file  . Transportation needs    Medical: Not on file    Non-medical: Not on file  Tobacco Use  . Smoking status: Current Every Day Smoker   Packs/day: 1.00    Years: 40.00    Pack years: 40.00  . Smokeless tobacco: Never Used  . Tobacco comment: pt declines  Substance and Sexual Activity  . Alcohol use: No  . Drug use: No  . Sexual activity: Not Currently    Partners: Male    Birth control/protection: Surgical  Lifestyle  . Physical activity    Days per week: Not on file    Minutes per session: Not on file  . Stress: Not on file  Relationships  . Social Herbalist on phone: Not on file    Gets together: Not on file    Attends religious service: Not on file    Active member of club or organization: Not on file    Attends meetings of clubs or organizations: Not on file    Relationship status: Not on file  Other Topics Concern  . Not on file  Social History Narrative  . Not on file   Past Surgical History:  Procedure Laterality Date  . CESAREAN SECTION     X 3  . DILATION AND CURETTAGE OF UTERUS    . SHOULDER ARTHROSCOPY WITH OPEN ROTATOR CUFF REPAIR Right 05/2009  . TONSILLECTOMY  1977  . TUBAL LIGATION     Past Medical History:  Diagnosis  Date  . Allergy   . Arthritis   . Basal cell carcinoma 2018   Removed from Right neck per patient    There were no vitals taken for this visit.  Opioid Risk Score:   Fall Risk Score:  `1  Depression screen PHQ 2/9  Depression screen [facility] 2/9 03/09/2019 12/29/2018 03/07/2018 01/03/2018 06/26/2017 09/05/2016 04/03/2016  Decreased Interest 0 0 0 0 0 0 3  Down, Depressed, Hopeless 0 0 0 0 0 0 0  PHQ - 2 Score 0 0 0 0 0 0 3  Altered sleeping - - - - - - -  Tired, decreased energy - - - - - - -  Change in appetite - - - - - - -  Feeling bad or failure about yourself  - - - - - - -  Trouble concentrating - - - - - - -  Moving slowly or fidgety/restless - - - - - - -  Suicidal thoughts - - - - - - -  PHQ-9 Score - - - - - - -     Review of Systems  Constitutional: Negative.   HENT: Negative.   Eyes: Negative.   Respiratory: Negative.   Cardiovascular:  Negative.   Gastrointestinal: Negative.   Endocrine: Negative.   Genitourinary: Negative.   Musculoskeletal: Positive for arthralgias, back pain and neck pain.  Skin: Negative.   Allergic/Immunologic: Negative.   Neurological: Negative.   Hematological: Negative.   Psychiatric/Behavioral: Negative.   All other systems reviewed and are negative.      Objective:   Physical Exam Vitals signs and nursing note reviewed.  Constitutional:      Appearance: Normal appearance.  Neck:     Musculoskeletal: Normal range of motion and neck supple.     Comments: Cervical Paraspinal Tenderness: C-5-C-6 Cardiovascular:     Rate and Rhythm: Normal rate and regular rhythm.     Pulses: Normal pulses.     Heart sounds: Normal heart sounds.  Pulmonary:     Effort: Pulmonary effort is normal.     Breath sounds: Normal breath sounds.  Musculoskeletal:     Comments: Normal Muscle Bulk and Muscle Testing Reveals:  Upper Extremities: Full ROM and Muscle Strength 5/5 Right AC Joint tenderness  Thoracic Paraspinal Tenderness: T-1-T-3 Lower Extremities: Full ROM and Muscle Strength 5/5 Arises from chair with ease Narrow Based Gait   Skin:    General: Skin is warm and dry.  Neurological:     Mental Status: She is alert and oriented to person, place, and time.  Psychiatric:        Mood and Affect: Mood normal.        Behavior: Behavior normal.           Assessment & Plan:  1.Cervical spinal stenosis without evidence ofradiculopathy. Chronic neck pain: Refilled Tylenol #4 300/60mg  daily, may take an extra tablet when pain is sever. #30.03/30/2019 2. Right shoulder rotator cuff tear, status post repair with chronic pain and contracture : Continue with exercise regimeas tolerated.03/30/2019 3. Insomnia:Continue Melatonin.03/30/2019 4. Chronic Bilateral Lower Back Pain without Sciatica:No complaints Today.Continue HEP as tolerated. Continue to Monitor.03/30/2019 5. Right Greater  Trochanter Bursitis: Continue to Alternate Ice and Heat Therapy. Continue to Monitor. 03/30/2019 6. Chronic right Sided Thoracic Back Pain: Continue current medication regime. Continue HEP as tolerated. Continue to monitor.   15 minutes of face to face patient care time was spent during this visit. All questions were encouraged and answered.   F/U 3  months

## 2019-04-04 LAB — TOXASSURE SELECT,+ANTIDEPR,UR

## 2019-04-06 ENCOUNTER — Telehealth: Payer: Self-pay | Admitting: *Deleted

## 2019-04-06 NOTE — Telephone Encounter (Signed)
Urine drug screen for this encounter is consistent for prescribed medication 

## 2019-04-10 DIAGNOSIS — R350 Frequency of micturition: Secondary | ICD-10-CM | POA: Diagnosis not present

## 2019-04-10 DIAGNOSIS — N3281 Overactive bladder: Secondary | ICD-10-CM | POA: Diagnosis not present

## 2019-05-06 DIAGNOSIS — L280 Lichen simplex chronicus: Secondary | ICD-10-CM | POA: Diagnosis not present

## 2019-05-06 DIAGNOSIS — D1801 Hemangioma of skin and subcutaneous tissue: Secondary | ICD-10-CM | POA: Diagnosis not present

## 2019-05-06 DIAGNOSIS — C4441 Basal cell carcinoma of skin of scalp and neck: Secondary | ICD-10-CM | POA: Diagnosis not present

## 2019-05-06 DIAGNOSIS — L821 Other seborrheic keratosis: Secondary | ICD-10-CM | POA: Diagnosis not present

## 2019-05-12 ENCOUNTER — Other Ambulatory Visit: Payer: Self-pay

## 2019-05-12 ENCOUNTER — Ambulatory Visit (INDEPENDENT_AMBULATORY_CARE_PROVIDER_SITE_OTHER): Payer: Medicare Other | Admitting: Medical

## 2019-05-12 ENCOUNTER — Encounter: Payer: Self-pay | Admitting: Medical

## 2019-05-12 VITALS — Temp 98.8°F | Wt 216.0 lb

## 2019-05-12 DIAGNOSIS — J3489 Other specified disorders of nose and nasal sinuses: Secondary | ICD-10-CM | POA: Diagnosis not present

## 2019-05-12 DIAGNOSIS — R05 Cough: Secondary | ICD-10-CM

## 2019-05-12 DIAGNOSIS — H938X9 Other specified disorders of ear, unspecified ear: Secondary | ICD-10-CM

## 2019-05-12 DIAGNOSIS — R059 Cough, unspecified: Secondary | ICD-10-CM

## 2019-05-12 DIAGNOSIS — Z20828 Contact with and (suspected) exposure to other viral communicable diseases: Secondary | ICD-10-CM | POA: Diagnosis not present

## 2019-05-12 MED ORDER — DOXYCYCLINE HYCLATE 100 MG PO TABS: ORAL_TABLET | ORAL | 0 refills | Status: DC

## 2019-05-12 MED ORDER — LEVOCETIRIZINE DIHYDROCHLORIDE 5 MG PO TABS: 5.0000 mg | ORAL_TABLET | Freq: Every evening | ORAL | 0 refills | Status: DC

## 2019-05-12 MED ORDER — BENZONATATE 100 MG PO CAPS: 100.0000 mg | ORAL_CAPSULE | Freq: Three times a day (TID) | ORAL | 0 refills | Status: DC | PRN

## 2019-05-12 NOTE — Progress Notes (Signed)
Subjective:    Patient ID: Susan Cortez, female    DOB: [birth year], 58 y.o.   MRN: [id number]  HPI  Virtual Visit via Video Note  I connected with Susan Cortez on 05/12/19 at  1:20 PM EDT by a video enabled telemedicine application and verified that I am speaking with the correct person using two identifiers.  Location: Patient: home Provider: office  bp and pulse not checked.   I discussed the limitations of evaluation and management by telemedicine and the availability of in person appointments. The patient expressed understanding and agreed to proceed.  History of Present Illness: Pt in stating she has had on and off cough for about 2 months. Some ear pressure and pnd. This has come and gone 3 times in past. She has been sneezing at times. Some frontal sinus pressure presently. Most recent symptoms for 4 days. No fever, no chills or sweats. No muscle aches. No significant fatigue. Some productive cough. Pt states quite smoking and restarted after covid. Pt daughter has similar symptoms.   Pt takes care of grandchild. But stays home otherwise.  One month ago she had covid test due to indirect contact. Test came back negative  Pt not sob or wheezing.    Observations/Objective: General-no acute distress, pleasant, oriented. Lungs- on inspection lungs appear unlabored. Neck- no tracheal deviation or jvd on inspection. Neuro- gross motor function appears intact. heent- frontal sinus pressure.  Assessment and Plan: Patient has had 3 similar episodes over the past 2 months of current signs/symptoms.  Patient states other  time she did get better and most recent recurrent symptoms present now for about 4 days.  She is convinced that she has sinus infection.  I did discuss with her that may be  the case and that she might have some underlying allergies.  However I did also explain that this might represent COVID infection.  She expressed she did not think this was the case and  she  expressed no need to get tested.   I did explain  to her that she should quarantine and  not go to any medical appointments while sick with these symptoms. Basically stay home  at least 7-10 days while assess clinical response.. Explained she will be screened at any medical office wherever she might go(she mentioned derm visit on Thursday. I explained offices being very strict about this and they turn patients away on screening questions. Also explained if not improving would definitely recommend she get tested.  Treated today with doxycycline antibiotic and benzonatate for cough. She declined Flonase nasal spray.  I did go ahead and send in a Xyzal antihistamine.   Follow up in 7-10 days or as needed   Mackie Pai, PA-C Follow Up Instructions:    I discussed the assessment and treatment plan with the patient. The patient was provided an opportunity to ask questions and all were answered. The patient agreed with the plan and demonstrated an understanding of the instructions.   The patient was advised to call back or seek an in-person evaluation if the symptoms worsen or if the condition fails to improve as anticipated.  I provided 25  minutes of non-face-to-face time during this encounter. 50% of time spent counseling pt on plan going forward.   Mackie Pai, PA-C   Review of Systems  Constitutional: Negative for diaphoresis, fatigue and fever.  HENT: Positive for congestion, sinus pressure and sinus pain. Negative for mouth sores, sneezing and tinnitus.   Respiratory: Positive  for cough. Negative for chest tightness, shortness of breath and wheezing.   Cardiovascular: Negative for chest pain and palpitations.  Gastrointestinal: Negative for abdominal pain.  Hematological: Negative for adenopathy. Does not bruise/bleed easily.       Objective:   Physical Exam        Assessment & Plan:

## 2019-05-12 NOTE — Patient Instructions (Addendum)
Patient has had 3 similar episodes over the past 2 months of current signs/symptoms.  Patient states other  time she did get better and most recent recurrent symptoms present now for about 4 days.  She is convinced that she has sinus infection.  I did discuss with her that may be  the case and that she might have some underlying allergies.  However I did also explain that this might represent COVID infection.  She expressed she did not think this was the case and she  expressed no need to get tested.   I did explain  to her that she should quarantine and  not go to any medical appointments while sick with these symptoms. Basically stay home  at least 7-10 days while assess clinical response.. Explained she will be screened at any medical office wherever she might go(she mentioned derm visit on Thursday. I explained offices being very strict about this and they turn patients away on screening questions. Also explained if not improving would definitely recommend she get tested.  Treated today with doxycycline antibiotic and benzonatate for cough. She declined Flonase nasal spray.  I did go ahead and send in a Xyzal antihistamine.   Follow up in 7-10 days or as needed

## 2019-05-14 DIAGNOSIS — C4441 Basal cell carcinoma of skin of scalp and neck: Secondary | ICD-10-CM | POA: Diagnosis not present

## 2019-05-31 DIAGNOSIS — R7303 Prediabetes: Secondary | ICD-10-CM | POA: Insufficient documentation

## 2019-05-31 NOTE — Progress Notes (Addendum)
[facility] [place], [place], [place] [address] [phone]  Date:  06/03/2019   Name:  Susan Cortez   DOB:  May 25, 1961   MRN:  [id number]  PCP:  Darreld Mclean, MD    Chief Complaint: Annual Exam (flu shot)   History of Present Illness:  Susan Cortez is a 58 y.o. very pleasant female patient who presents with the following:  Here today for complete physical Patient with history of dyslipidemia, chronic back pain, intermittent claudication, spinal stenosis, adrenal adenoma, prediabetes Last visit with myself virtually in April She is consulted with urology regarding urinary incontinence, they started her on Myrbetriq and she has done quite well Physical medicine and rehabilitation manages her chronic neck and back pain-last visit with them in August: .Cervical spinal stenosis without evidence ofradiculopathy. Chronic neck pain: Refilled Tylenol #4 300/60mg  daily, may take an extra tablet when pain is sever. #30.03/30/2019 2. Right shoulder rotator cuff tear, status post repair with chronic pain and contracture : Continue with exercise regimeas tolerated.03/30/2019 3. Insomnia:Continue Melatonin.03/30/2019 4. Chronic Bilateral Lower Back Pain without Sciatica:No complaints Today.Continue HEP as tolerated. Continue to Monitor.03/30/2019 5. Right Greater Trochanter Bursitis: Continue to Alternate Ice and Heat Therapy. Continue to Monitor. 03/30/2019 6. Chronic right Sided Thoracic Back Pain: Continue current medication regime. Continue HEP as tolerated. Continue to monitor.  Due for Pap this year; pt declines today  Routine blood work due- she is fasting  Flu shot- do today Can suggest Shingrix Mammogram up-to-date Colon cancer screen up-to-date History of heavy smoking, current smoker.  Due for annual CT lung cancer screening She would like to start shingrix - however she has medicare so will  need to do at her drug store  She has been helping a lot with her 102 year old grandson during virtual schooling She is still helping him quite a bit- she is looking forward to his going back to school hopefully soon   Lab Results  Component Value Date   HGBA1C 5.7 01/08/2018   Wt Readings from Last 3 Encounters:  06/03/19 214 lb (97.1 kg)  05/12/19 216 lb (98 kg)  03/30/19 219 lb 3.2 oz (99.4 kg)   No CP or SOB No PMB No digestive concerns   Patient Active Problem List   Diagnosis Date Noted  . Prediabetes 05/31/2019  . Dyslipidemia 03/01/2018  . De Quervain's disease (tenosynovitis) 03/28/2017  . Chronic bilateral low back pain without sciatica 03/28/2017  . Sebaceous hyperplasia of face 06/26/2016  . Chronic right shoulder pain 01/02/2016  . Adenoma of left adrenal gland 12/26/2015  . Intermittent claudication ([place]) 12/26/2015  . Rotator cuff (capsule) sprain 03/26/2014  . Spinal stenosis in cervical region 03/26/2014  . Chronic pain 03/26/2014    Past Medical History:  Diagnosis Date  . Allergy   . Arthritis   . Basal cell carcinoma 2018   Removed from Right neck per patient     Past Surgical History:  Procedure Laterality Date  . CESAREAN SECTION     X 3  . DILATION AND CURETTAGE OF UTERUS    . SHOULDER ARTHROSCOPY WITH OPEN ROTATOR CUFF REPAIR Right 05/2009  . TONSILLECTOMY  1977  . TUBAL LIGATION      Social History   Tobacco Use  . Smoking status: Current Every Day Smoker    Packs/day: 1.00    Years: 40.00    Pack years: 40.00  . Smokeless tobacco: Never Used  .  Tobacco comment: pt declines  Substance Use Topics  . Alcohol use: No  . Drug use: No    Family History  Problem Relation Age of Onset  . Diabetes Father   . Heart disease Father   . Hyperlipidemia Father     Allergies  Allergen Reactions  . Chantix [Varenicline] Other (See Comments)    Strange dreams/hallucinations  . Diclofenac Swelling  . Meloxicam Swelling  . Butrans  [Buprenorphine] Itching    Itching and redness at the area of the patch.  Recardo Evangelist [Pregabalin]     Headache,severe eye pain, swelling  . Nsaids     Stomach pain  . Tolmetin Other (See Comments)    Stomach pain    Medication list has been reviewed and updated.  Current Outpatient Medications on File Prior to Visit  Medication Sig Dispense Refill  . acetaminophen-codeine (TYLENOL #4) 300-60 MG tablet Take 1 tablet by mouth daily. Do Not Fill Before 04/27/2019 30 tablet 2  . Calcium-Magnesium-Vitamin D (CALCIUM 500 PO) Take by mouth.    . cholecalciferol (VITAMIN D) 1000 units tablet Take 1,000 Units by mouth daily.    Marland Kitchen levocetirizine (XYZAL) 5 MG tablet Take 1 tablet (5 mg total) by mouth every evening. 30 tablet 0  . mirabegron ER (MYRBETRIQ) 50 MG TB24 tablet Take 1 tablet (50 mg total) by mouth daily. 90 tablet 3  . polyethylene glycol powder (GLYCOLAX/MIRALAX) powder Take 17 g 2 (two) times daily as needed by mouth. Use for constipation 3350 g 6   No current facility-administered medications on file prior to visit.     Review of Systems:  As per HPI- otherwise negative.   Physical Examination: Vitals:   06/03/19 1524 06/03/19 1558  BP: 130/82   Pulse: (!) 107 96  Resp: 17   Temp: (!) 96.5 F (35.8 C)   SpO2: 96%    Vitals:   06/03/19 1524  Weight: 214 lb (97.1 kg)  Height: 5\' 8"  (1.727 m)   Body mass index is 32.54 kg/m. Ideal Body Weight: Weight in (lb) to have BMI = 25: 164.1  GEN: WDWN, NAD, Non-toxic, A & O x 3, obese, looks well.  Smoker  HEENT: Atraumatic, Normocephalic. Neck supple. No masses, No LAD.  Bilateral TM wnl, oropharynx normal.  PEERL,EOMI.   Ears and Nose: No external deformity. CV: RRR, No M/G/R. No JVD. No thrill. No extra heart sounds. PULM: CTA B, no wheezes, crackles, rhonchi. No retractions. No resp. distress. No accessory muscle use. ABD: S, NT, ND. No rebound. No HSM. EXTR: No c/c/e NEURO Normal gait.  PSYCH: Normally interactive.  Conversant. Not depressed or anxious appearing.  Calm demeanor.    Assessment and Plan: Physical exam  Dyslipidemia - Plan: Lipid panel, lovastatin (MEVACOR) 20 MG tablet  Urinary frequency  Spinal stenosis in cervical region  Chronic pain syndrome  Prediabetes - Plan: Comprehensive metabolic panel, Hemoglobin A1c  Screening for cervical cancer  Screening for deficiency anemia - Plan: CBC  Screening for HIV (human immunodeficiency virus) - Plan: HIV Antibody (routine testing w rflx)  Screening for thyroid disorder - Plan: TSH  Encounter for screening for lung cancer - Plan: CT CHEST LUNG CA SCREEN LOW DOSE W/O CM  Here today for a CPE Labs pending as above Screening CT ordered Flu shot given Will plan further follow- up pending labs.   Signed Lamar Blinks, MD   Received her labs, message to patient Critical CBC result Results for orders placed or performed in visit  on 06/03/19  CBC  Result Value Ref Range   WBC 18.0 Repeated and verified X2. (HH) 4.0 - 10.5 K/uL   RBC 4.84 3.87 - 5.11 Mil/uL   Platelets 416.0 (H) 150.0 - 400.0 K/uL   Hemoglobin 15.4 (H) 12.0 - 15.0 g/dL   HCT 46.7 (H) 36.0 - 46.0 %   MCV 96.5 78.0 - 100.0 fl   MCHC 33.1 30.0 - 36.0 g/dL   RDW 13.9 11.5 - 15.5 %  Comprehensive metabolic panel  Result Value Ref Range   Sodium 136 135 - 145 mEq/L   Potassium 4.4 3.5 - 5.1 mEq/L   Chloride 103 96 - 112 mEq/L   CO2 24 19 - 32 mEq/L   Glucose, Bld 95 70 - 99 mg/dL   BUN 7 6 - 23 mg/dL   Creatinine, Ser 0.80 0.40 - 1.20 mg/dL   Total Bilirubin 0.5 0.2 - 1.2 mg/dL   Alkaline Phosphatase 50 39 - 117 U/L   AST 25 0 - 37 U/L   ALT 24 0 - 35 U/L   Total Protein 7.7 6.0 - 8.3 g/dL   Albumin 4.8 3.5 - 5.2 g/dL   Calcium 10.0 8.4 - 10.5 mg/dL   GFR 73.25 >60.00 mL/min  Hemoglobin A1c  Result Value Ref Range   Hgb A1c MFr Bld 5.9 4.6 - 6.5 %  Lipid panel  Result Value Ref Range   Cholesterol 182 0 - 200 mg/dL   Triglycerides 111.0 0.0 -  149.0 mg/dL   HDL 55.20 >39.00 mg/dL   VLDL 22.2 0.0 - 40.0 mg/dL   LDL Cholesterol 104 (H) 0 - 99 mg/dL   Total CHOL/HDL Ratio 3    NonHDL 126.44   TSH  Result Value Ref Range   TSH 1.27 0.35 - 4.50 uIU/mL  HIV Antibody (routine testing w rflx)  Result Value Ref Range   HIV 1&2 Ab, 4th Generation NON-REACTIVE NON-REACTI   Labs overall look good-thyroid is normal, HIV negative, cholesterol favorable, A1c stable in prediabetes range, metabolic profile normal  We got a surprise with your white blood cell count.  It is quite high. As you are feeling well, I wonder if this may be some sort of error.  I would like to repeat it as soon as possible, within the next few days or tomorrow if he can. If your white count is indeed actually this high, we will want you to see hematology promptly for evaluation.  Another possible explanation is steroid use -have you been on prednisone or another steroid recently?  I will go ahead and order your repeat blood count, and will ask my nurse to reach out to you tomorrow and get you scheduled for repeat blood test

## 2019-05-31 NOTE — Patient Instructions (Addendum)
It was nice to see you again today, I will be in touch with your labs ASAP  We will set you up for a lung cancer screening CT- you can stop in the imaging dept on the ground floor or call them to schedule [phone] You got your flu shot and first dose of shingles today   Health Maintenance, Female Adopting a healthy lifestyle and getting preventive care are important in promoting health and wellness. Ask your health care provider about:  The right schedule for you to have regular tests and exams.  Things you can do on your own to prevent diseases and keep yourself healthy. What should I know about diet, weight, and exercise? Eat a healthy diet   Eat a diet that includes plenty of vegetables, fruits, low-fat dairy products, and lean protein.  Do not eat a lot of foods that are high in solid fats, added sugars, or sodium. Maintain a healthy weight Body mass index (BMI) is used to identify weight problems. It estimates body fat based on height and weight. Your health care provider can help determine your BMI and help you achieve or maintain a healthy weight. Get regular exercise Get regular exercise. This is one of the most important things you can do for your health. Most adults should:  Exercise for at least 150 minutes each week. The exercise should increase your heart rate and make you sweat (moderate-intensity exercise).  Do strengthening exercises at least twice a week. This is in addition to the moderate-intensity exercise.  Spend less time sitting. Even light physical activity can be beneficial. Watch cholesterol and blood lipids Have your blood tested for lipids and cholesterol at 59 years of age, then have this test every 5 years. Have your cholesterol levels checked more often if:  Your lipid or cholesterol levels are high.  You are older than 59 years of age.  You are at high risk for heart disease. What should I know about cancer screening? Depending on your  health history and family history, you may need to have cancer screening at various ages. This may include screening for:  Breast cancer.  Cervical cancer.  Colorectal cancer.  Skin cancer.  Lung cancer. What should I know about heart disease, diabetes, and high blood pressure? Blood pressure and heart disease  High blood pressure causes heart disease and increases the risk of stroke. This is more likely to develop in people who have high blood pressure readings, are of African descent, or are overweight.  Have your blood pressure checked: ? Every 3-5 years if you are 70-15 years of age. ? Every year if you are 27 years old or older. Diabetes Have regular diabetes screenings. This checks your fasting blood sugar level. Have the screening done:  Once every three years after age 47 if you are at a normal weight and have a low risk for diabetes.  More often and at a younger age if you are overweight or have a high risk for diabetes. What should I know about preventing infection? Hepatitis B If you have a higher risk for hepatitis B, you should be screened for this virus. Talk with your health care provider to find out if you are at risk for hepatitis B infection. Hepatitis C Testing is recommended for:  Everyone born from [birth year] through 1966.  Anyone with known risk factors for hepatitis C. Sexually transmitted infections (STIs)  Get screened for STIs, including gonorrhea and chlamydia, if: ? You are sexually active  and are younger than 59 years of age. ? You are older than 59 years of age and your health care provider tells you that you are at risk for this type of infection. ? Your sexual activity has changed since you were last screened, and you are at increased risk for chlamydia or gonorrhea. Ask your health care provider if you are at risk.  Ask your health care provider about whether you are at high risk for HIV. Your health care provider may recommend a prescription  medicine to help prevent HIV infection. If you choose to take medicine to prevent HIV, you should first get tested for HIV. You should then be tested every 3 months for as long as you are taking the medicine. Pregnancy  If you are about to stop having your period (premenopausal) and you may become pregnant, seek counseling before you get pregnant.  Take 400 to 800 micrograms (mcg) of folic acid every day if you become pregnant.  Ask for birth control (contraception) if you want to prevent pregnancy. Osteoporosis and menopause Osteoporosis is a disease in which the bones lose minerals and strength with aging. This can result in bone fractures. If you are 64 years old or older, or if you are at risk for osteoporosis and fractures, ask your health care provider if you should:  Be screened for bone loss.  Take a calcium or vitamin D supplement to lower your risk of fractures.  Be given hormone replacement therapy (HRT) to treat symptoms of menopause. Follow these instructions at home: Lifestyle  Do not use any products that contain nicotine or tobacco, such as cigarettes, e-cigarettes, and chewing tobacco. If you need help quitting, ask your health care provider.  Do not use street drugs.  Do not share needles.  Ask your health care provider for help if you need support or information about quitting drugs. Alcohol use  Do not drink alcohol if: ? Your health care provider tells you not to drink. ? You are pregnant, may be pregnant, or are planning to become pregnant.  If you drink alcohol: ? Limit how much you use to 0-1 drink a day. ? Limit intake if you are breastfeeding.  Be aware of how much alcohol is in your drink. In the [place] one drink equals one 12 oz bottle of beer (355 mL), one 5 oz glass of wine (148 mL), or one 1 oz glass of hard liquor (44 mL). General instructions  Schedule regular health, dental, and eye exams.  Stay current with your vaccines.  Tell your health  care provider if: ? You often feel depressed. ? You have ever been abused or do not feel safe at home. Summary  Adopting a healthy lifestyle and getting preventive care are important in promoting health and wellness.  Follow your health care provider's instructions about healthy diet, exercising, and getting tested or screened for diseases.  Follow your health care provider's instructions on monitoring your cholesterol and blood pressure. This information is not intended to replace advice given to you by your health care provider. Make sure you discuss any questions you have with your health care provider. Document Released: 02/26/2011 Document Revised: 08/06/2018 Document Reviewed: 08/06/2018 Elsevier Patient Education  2020 [employer].

## 2019-06-02 ENCOUNTER — Other Ambulatory Visit: Payer: Self-pay

## 2019-06-03 ENCOUNTER — Encounter: Payer: Self-pay | Admitting: Family Medicine

## 2019-06-03 ENCOUNTER — Ambulatory Visit (INDEPENDENT_AMBULATORY_CARE_PROVIDER_SITE_OTHER): Payer: Medicare Other | Admitting: Family Medicine

## 2019-06-03 ENCOUNTER — Other Ambulatory Visit: Payer: Self-pay | Admitting: Medical

## 2019-06-03 VITALS — BP 130/82 | HR 96 | Temp 96.5°F | Resp 17 | Ht 68.0 in | Wt 214.0 lb

## 2019-06-03 DIAGNOSIS — M4802 Spinal stenosis, cervical region: Secondary | ICD-10-CM

## 2019-06-03 DIAGNOSIS — Z1329 Encounter for screening for other suspected endocrine disorder: Secondary | ICD-10-CM

## 2019-06-03 DIAGNOSIS — E785 Hyperlipidemia, unspecified: Secondary | ICD-10-CM

## 2019-06-03 DIAGNOSIS — G894 Chronic pain syndrome: Secondary | ICD-10-CM | POA: Diagnosis not present

## 2019-06-03 DIAGNOSIS — Z124 Encounter for screening for malignant neoplasm of cervix: Secondary | ICD-10-CM

## 2019-06-03 DIAGNOSIS — Z122 Encounter for screening for malignant neoplasm of respiratory organs: Secondary | ICD-10-CM

## 2019-06-03 DIAGNOSIS — R7303 Prediabetes: Secondary | ICD-10-CM

## 2019-06-03 DIAGNOSIS — R350 Frequency of micturition: Secondary | ICD-10-CM

## 2019-06-03 DIAGNOSIS — Z23 Encounter for immunization: Secondary | ICD-10-CM

## 2019-06-03 DIAGNOSIS — Z0000 Encounter for general adult medical examination without abnormal findings: Secondary | ICD-10-CM

## 2019-06-03 DIAGNOSIS — Z114 Encounter for screening for human immunodeficiency virus [HIV]: Secondary | ICD-10-CM

## 2019-06-03 DIAGNOSIS — Z130 Encounter for screening for diseases of the blood and blood-forming organs and certain disorders involving the immune mechanism: Secondary | ICD-10-CM | POA: Diagnosis not present

## 2019-06-03 DIAGNOSIS — D72829 Elevated white blood cell count, unspecified: Secondary | ICD-10-CM

## 2019-06-03 MED ORDER — LOVASTATIN 20 MG PO TABS: ORAL_TABLET | ORAL | 3 refills | Status: DC

## 2019-06-03 MED ORDER — SHINGRIX 50 MCG/0.5ML IM SUSR: 0.5000 mL | Freq: Once | INTRAMUSCULAR | 1 refills | Status: AC

## 2019-06-03 MED FILL — SHINGRIX 50 MCG SUS: 50 | 1 days supply | Qty: 1 | Fill #0

## 2019-06-04 ENCOUNTER — Encounter: Payer: Self-pay | Admitting: Family Medicine

## 2019-06-04 ENCOUNTER — Telehealth: Payer: Self-pay | Admitting: *Deleted

## 2019-06-04 DIAGNOSIS — D72829 Elevated white blood cell count, unspecified: Secondary | ICD-10-CM

## 2019-06-04 LAB — CBC
HCT: 46.7 % — ABNORMAL HIGH (ref 36.0–46.0)
Hemoglobin: 15.4 g/dL — ABNORMAL HIGH (ref 12.0–15.0)
MCHC: 33.1 g/dL (ref 30.0–36.0)
MCV: 96.5 fl (ref 78.0–100.0)
Platelets: 416 10*3/uL — ABNORMAL HIGH (ref 150.0–400.0)
RBC: 4.84 Mil/uL (ref 3.87–5.11)
RDW: 13.9 % (ref 11.5–15.5)
WBC: 18 10*3/uL (ref 4.0–10.5)

## 2019-06-04 LAB — COMPREHENSIVE METABOLIC PANEL
ALT: 24 U/L (ref 0–35)
AST: 25 U/L (ref 0–37)
Albumin: 4.8 g/dL (ref 3.5–5.2)
Alkaline Phosphatase: 50 U/L (ref 39–117)
BUN: 7 mg/dL (ref 6–23)
CO2: 24 mEq/L (ref 19–32)
Calcium: 10 mg/dL (ref 8.4–10.5)
Chloride: 103 mEq/L (ref 96–112)
Creatinine, Ser: 0.8 mg/dL (ref 0.40–1.20)
GFR: 73.25 mL/min (ref >60.00)
Glucose, Bld: 95 mg/dL (ref 70–99)
Potassium: 4.4 mEq/L (ref 3.5–5.1)
Sodium: 136 mEq/L (ref 135–145)
Total Bilirubin: 0.5 mg/dL (ref 0.2–1.2)
Total Protein: 7.7 g/dL (ref 6.0–8.3)

## 2019-06-04 LAB — HEMOGLOBIN A1C: Hgb A1c MFr Bld: 5.9 % (ref 4.6–6.5)

## 2019-06-04 LAB — HIV ANTIBODY (ROUTINE TESTING W REFLEX): HIV 1&2 Ab, 4th Generation: NONREACTIVE

## 2019-06-04 LAB — LIPID PANEL
Cholesterol: 182 mg/dL (ref 0–200)
HDL: 55.2 mg/dL (ref >39.00)
LDL Cholesterol: 104 mg/dL — ABNORMAL HIGH (ref 0–99)
NonHDL: 126.44
Total CHOL/HDL Ratio: 3
Triglycerides: 111 mg/dL (ref 0.0–149.0)
VLDL: 22.2 mg/dL (ref 0.0–40.0)

## 2019-06-04 LAB — TSH: TSH: 1.27 u[IU]/mL (ref 0.35–4.50)

## 2019-06-04 NOTE — Telephone Encounter (Signed)
CRITICAL VALUE STICKER  CRITICAL VALUE:  WBC 18.0  RECEIVER (on-site recipient of call): Micajah Dennin, [employer] NOTIFIED:  06/04/19 @ 4:45pm  MESSENGER (representative from lab):  Shari Prows.  MD NOTIFIED: [place]  TIME OF NOTIFICATION:  RESPONSE:

## 2019-06-04 NOTE — Telephone Encounter (Signed)
Lab received, communicate with patient. We will bring her in for a CBC with differential, pathology smear ASAP  Mel Almond can you please call the patient on Friday, 10/9.  Would like to get her in for labs tomorrow if at all possible

## 2019-06-04 NOTE — Addendum Note (Signed)
Addended by: Lamar Blinks C on: 06/04/2019 05:56 PM   Modules accepted: Orders

## 2019-06-05 ENCOUNTER — Other Ambulatory Visit (INDEPENDENT_AMBULATORY_CARE_PROVIDER_SITE_OTHER): Payer: Medicare Other

## 2019-06-05 ENCOUNTER — Other Ambulatory Visit: Payer: Self-pay

## 2019-06-05 DIAGNOSIS — D72829 Elevated white blood cell count, unspecified: Secondary | ICD-10-CM

## 2019-06-05 NOTE — Addendum Note (Signed)
Addended by: Kelle Darting A on: 06/05/2019 02:04 PM   Modules accepted: Orders

## 2019-06-05 NOTE — Telephone Encounter (Signed)
Patient scheduled for this afternoon.

## 2019-06-08 ENCOUNTER — Encounter: Payer: Self-pay | Admitting: Family Medicine

## 2019-06-08 LAB — CBC WITH DIFFERENTIAL/PLATELET
Absolute Monocytes: 960 cells/uL — ABNORMAL HIGH (ref 200–950)
Basophils Absolute: 77 cells/uL (ref 0–200)
Basophils Relative: 0.6 %
Eosinophils Absolute: 128 cells/uL (ref 15–500)
Eosinophils Relative: 1 %
HCT: 44.8 % (ref 35.0–45.0)
Hemoglobin: 15 g/dL (ref 11.7–15.5)
Lymphs Abs: 4275 cells/uL — ABNORMAL HIGH (ref 850–3900)
MCH: 31.1 pg (ref 27.0–33.0)
MCHC: 33.5 g/dL (ref 32.0–36.0)
MCV: 92.8 fL (ref 80.0–100.0)
MPV: 10.7 fL (ref 7.5–12.5)
Monocytes Relative: 7.5 %
Neutro Abs: 7360 cells/uL (ref 1500–7800)
Neutrophils Relative %: 57.5 %
Platelets: 412 10*3/uL — ABNORMAL HIGH (ref 140–400)
RBC: 4.83 10*6/uL (ref 3.80–5.10)
RDW: 12.4 % (ref 11.0–15.0)
Total Lymphocyte: 33.4 %
WBC: 12.8 10*3/uL — ABNORMAL HIGH (ref 3.8–10.8)

## 2019-06-08 LAB — PATHOLOGIST SMEAR REVIEW

## 2019-06-10 ENCOUNTER — Other Ambulatory Visit: Payer: Self-pay

## 2019-06-10 ENCOUNTER — Ambulatory Visit (HOSPITAL_BASED_OUTPATIENT_CLINIC_OR_DEPARTMENT_OTHER)
Admission: RE | Admit: 2019-06-10 | Discharge: 2019-06-10 | Disposition: A | Discharge status: Home / Self Care | Payer: Medicare Other | Source: Ambulatory Visit | Attending: Family Medicine | Admitting: Family Medicine

## 2019-06-10 DIAGNOSIS — F17210 Nicotine dependence, cigarettes, uncomplicated: Secondary | ICD-10-CM | POA: Insufficient documentation

## 2019-06-10 DIAGNOSIS — Z122 Encounter for screening for malignant neoplasm of respiratory organs: Secondary | ICD-10-CM | POA: Diagnosis not present

## 2019-06-10 DIAGNOSIS — J432 Centrilobular emphysema: Secondary | ICD-10-CM | POA: Insufficient documentation

## 2019-06-10 DIAGNOSIS — I700 Atherosclerosis of aorta: Secondary | ICD-10-CM | POA: Insufficient documentation

## 2019-06-12 ENCOUNTER — Encounter: Payer: Self-pay | Admitting: Family Medicine

## 2019-06-16 ENCOUNTER — Other Ambulatory Visit: Payer: Self-pay | Admitting: Family Medicine

## 2019-06-16 ENCOUNTER — Encounter: Payer: Self-pay | Admitting: Family Medicine

## 2019-06-16 DIAGNOSIS — D72829 Elevated white blood cell count, unspecified: Secondary | ICD-10-CM

## 2019-07-14 ENCOUNTER — Other Ambulatory Visit: Payer: Self-pay | Admitting: Registered Nurse

## 2019-07-14 ENCOUNTER — Encounter: Payer: Self-pay | Admitting: Registered Nurse

## 2019-07-14 ENCOUNTER — Encounter: Payer: Medicare Other | Attending: Registered Nurse | Admitting: Registered Nurse

## 2019-07-14 ENCOUNTER — Other Ambulatory Visit: Payer: Self-pay

## 2019-07-14 VITALS — BP 117/82 | HR 96 | Temp 97.5°F | Ht 68.0 in | Wt 217.0 lb

## 2019-07-14 DIAGNOSIS — Z85828 Personal history of other malignant neoplasm of skin: Secondary | ICD-10-CM | POA: Insufficient documentation

## 2019-07-14 DIAGNOSIS — M546 Pain in thoracic spine: Secondary | ICD-10-CM

## 2019-07-14 DIAGNOSIS — M1990 Unspecified osteoarthritis, unspecified site: Secondary | ICD-10-CM | POA: Insufficient documentation

## 2019-07-14 DIAGNOSIS — M25511 Pain in right shoulder: Secondary | ICD-10-CM

## 2019-07-14 DIAGNOSIS — M545 Low back pain, unspecified: Secondary | ICD-10-CM

## 2019-07-14 DIAGNOSIS — G8929 Other chronic pain: Secondary | ICD-10-CM | POA: Diagnosis not present

## 2019-07-14 DIAGNOSIS — Z79891 Long term (current) use of opiate analgesic: Secondary | ICD-10-CM

## 2019-07-14 DIAGNOSIS — M4802 Spinal stenosis, cervical region: Secondary | ICD-10-CM | POA: Diagnosis not present

## 2019-07-14 DIAGNOSIS — M75101 Unspecified rotator cuff tear or rupture of right shoulder, not specified as traumatic: Secondary | ICD-10-CM | POA: Diagnosis not present

## 2019-07-14 DIAGNOSIS — F17210 Nicotine dependence, cigarettes, uncomplicated: Secondary | ICD-10-CM | POA: Insufficient documentation

## 2019-07-14 DIAGNOSIS — G4700 Insomnia, unspecified: Secondary | ICD-10-CM | POA: Diagnosis not present

## 2019-07-14 DIAGNOSIS — Z760 Encounter for issue of repeat prescription: Secondary | ICD-10-CM | POA: Diagnosis not present

## 2019-07-14 DIAGNOSIS — Z5181 Encounter for therapeutic drug level monitoring: Secondary | ICD-10-CM

## 2019-07-14 DIAGNOSIS — G894 Chronic pain syndrome: Secondary | ICD-10-CM

## 2019-07-14 DIAGNOSIS — M542 Cervicalgia: Secondary | ICD-10-CM

## 2019-07-14 MED ORDER — ACETAMINOPHEN-CODEINE #4 300-60 MG PO TABS: 1.0000 | ORAL_TABLET | Freq: Every day | ORAL | 2 refills | Status: DC

## 2019-07-14 NOTE — Progress Notes (Signed)
Subjective:    Patient ID: Susan Cortez, female    DOB: 06-Mar-1961, 58 y.o.   MRN: [id number]  HPI: Susan Cortez is a 58 y.o. female who returns for follow up appointment for chronic pain and medication refill. She states her pain is located in her neck radiating into her right shoulder, mid- back pain mainly right side and lower back pain. She rates her pain 7. Her current exercise regime is walking.   Susan Cortez Morphine equivalent is 9.00 MME.  UDS ordered today.     Pain Inventory Average Pain 5 Pain Right Now 7 My pain is sharp, burning, stabbing and aching  In the last 24 hours, has pain interfered with the following? General activity 2 Relation with others 0 Enjoyment of life 2 What TIME of day is your pain at its worst? daytime, evening Sleep (in general) Fair  Pain is worse with: bending, standing and some activites Pain improves with: rest and medication Relief from Meds: 7  Mobility how many minutes can you walk? 30 ability to climb steps?  yes do you drive?  yes  Function disabled: date disabled . Do you have any goals in this area?  no  Neuro/Psych No problems in this area  Prior Studies Any changes since last visit?  no  Physicians involved in your care Any changes since last visit?  no   Family History  Problem Relation Age of Onset  . Diabetes Father   . Heart disease Father   . Hyperlipidemia Father    Social History   Socioeconomic History  . Marital status: Divorced    Spouse name: Not on file  . Number of children: Not on file  . Years of education: Not on file  . Highest education level: Not on file  Occupational History  . Not on file  Social Needs  . Financial resource strain: Not on file  . Food insecurity    Worry: Not on file    Inability: Not on file  . Transportation needs    Medical: Not on file    Non-medical: Not on file  Tobacco Use  . Smoking status: Current Every Day Smoker    Packs/day: 1.00   Years: 40.00    Pack years: 40.00  . Smokeless tobacco: Never Used  . Tobacco comment: pt declines  Substance and Sexual Activity  . Alcohol use: No  . Drug use: No  . Sexual activity: Not Currently    Partners: Male    Birth control/protection: Surgical  Lifestyle  . Physical activity    Days per week: Not on file    Minutes per session: Not on file  . Stress: Not on file  Relationships  . Social Herbalist on phone: Not on file    Gets together: Not on file    Attends religious service: Not on file    Active member of club or organization: Not on file    Attends meetings of clubs or organizations: Not on file    Relationship status: Not on file  Other Topics Concern  . Not on file  Social History Narrative  . Not on file   Past Surgical History:  Procedure Laterality Date  . CESAREAN SECTION     X 3  . DILATION AND CURETTAGE OF UTERUS    . SHOULDER ARTHROSCOPY WITH OPEN ROTATOR CUFF REPAIR Right 05/2009  . TONSILLECTOMY  1977  . TUBAL LIGATION     Past Medical  History:  Diagnosis Date  . Allergy   . Arthritis   . Basal cell carcinoma 2018   Removed from Right neck per patient    BP 117/82   Pulse 96   Temp (!) 97.5 F (36.4 C)   Ht 5\' 8"  (1.727 m)   Wt 217 lb (98.4 kg)   SpO2 96%   BMI 32.99 kg/m   Opioid Risk Score:   Fall Risk Score:  `1  Depression screen PHQ 2/9  Depression screen [facility] 2/9 03/09/2019 12/29/2018 03/07/2018 01/03/2018 06/26/2017 09/05/2016 04/03/2016  Decreased Interest 0 0 0 0 0 0 3  Down, Depressed, Hopeless 0 0 0 0 0 0 0  PHQ - 2 Score 0 0 0 0 0 0 3  Altered sleeping - - - - - - -  Tired, decreased energy - - - - - - -  Change in appetite - - - - - - -  Feeling bad or failure about yourself  - - - - - - -  Trouble concentrating - - - - - - -  Moving slowly or fidgety/restless - - - - - - -  Suicidal thoughts - - - - - - -  PHQ-9 Score - - - - - - -    Review of Systems  Constitutional: Positive for diaphoresis.  HENT:  Negative.   Eyes: Negative.   Respiratory: Positive for cough.   Cardiovascular: Negative.   Gastrointestinal: Positive for constipation.  Endocrine: Negative.   Genitourinary: Negative.   Musculoskeletal: Positive for back pain and neck pain.  Skin: Negative.   Allergic/Immunologic: Negative.   Neurological: Negative.   Hematological: Negative.   Psychiatric/Behavioral: Negative.   All other systems reviewed and are negative.      Objective:   Physical Exam Vitals signs and nursing note reviewed.  Constitutional:      Appearance: Normal appearance.  Neck:     Musculoskeletal: Normal range of motion and neck supple.  Cardiovascular:     Rate and Rhythm: Normal rate and regular rhythm.     Pulses: Normal pulses.     Heart sounds: Normal heart sounds.  Musculoskeletal:     Comments: Normal Muscle Bulk and Muscle Testing Reveals:  Upper Extremities: Right: Decreased ROM 90 Degrees and Muscle Strength 5/5 Left Full ROM and Muscle Strength 5/5  Thoracic Paraspinal Tenderness" T-7-T-9 : Mainly Right Side Lumbar Paraspinal Tenderness: L-4-L-5 Lower Extremities: Full ROM and Muscle Strength 5/5 Arises from Table with Ease Narrow Based Gait   Skin:    General: Skin is warm and dry.  Neurological:     Mental Status: She is oriented to person, place, and time.  Psychiatric:        Mood and Affect: Mood normal.           Assessment & Plan:  1.Cervical spinal stenosis without evidence ofradiculopathy. Chronic neck pain: Refilled Tylenol #4 300/60mg  daily, may take an extra tablet when pain is sever. #30.07/14/2019 2. Right shoulder rotator cuff tear, status post repair with chronic pain and contracture : Continue with exercise regimeas tolerated.07/14/2019 3. Insomnia:Continue Melatonin.07/14/2019 4. Chronic Bilateral Lower Back Pain without Sciatica:No complaints Today.Continue HEP as tolerated. Continue to Monitor.07/14/2019 5. Right Greater Trochanter Bursitis:  No complaints Today. Continue to Alternate Ice and Heat Therapy. Continue to Monitor.07/14/2019 6. Chronic right Sided Thoracic Back Pain: Continue current medication regime. Continue HEP as tolerated. Continue to monitor. 07/14/2019  15 minutes of face to face patient care time was spent during this  visit. All questions were encouraged and answered.   F/U 3 months

## 2019-07-17 LAB — TOXASSURE SELECT,+ANTIDEPR,UR

## 2019-07-21 ENCOUNTER — Telehealth: Payer: Self-pay | Admitting: *Deleted

## 2019-07-21 NOTE — Telephone Encounter (Signed)
Urine drug screen for this encounter is consistent for prescribed medication 

## 2019-07-31 ENCOUNTER — Encounter: Payer: Self-pay | Admitting: Family Medicine

## 2019-07-31 ENCOUNTER — Telehealth: Payer: Self-pay

## 2019-07-31 MED ORDER — SHINGRIX 50 MCG/0.5ML IM SUSR: 0.5000 mL | Freq: Once | INTRAMUSCULAR | 0 refills | Status: AC

## 2019-07-31 MED FILL — SHINGRIX 50 MCG SUS: 50 | 1 days supply | Qty: 1 | Fill #0

## 2019-07-31 NOTE — Telephone Encounter (Signed)
Patient's first shingles documented as given on 06-03-2019  Copied from [place] #[id number]. Topic: General - Inquiry >> Jul 31, 2019  2:01 PM Richardo Priest, NT wrote: Reason for CRM: Patient called in stating she would like a script for her shingrix vaccination. Please advise. Pt would like it sent to [place], [place] - [place] [phone] (Phone) [phone] (Fax)

## 2019-07-31 NOTE — Addendum Note (Signed)
Addended by: Lamar Blinks C on: 07/31/2019 02:49 PM   Modules accepted: Orders

## 2019-08-06 ENCOUNTER — Other Ambulatory Visit (INDEPENDENT_AMBULATORY_CARE_PROVIDER_SITE_OTHER): Payer: Medicare Other

## 2019-08-06 ENCOUNTER — Other Ambulatory Visit: Payer: Self-pay

## 2019-08-06 DIAGNOSIS — D72829 Elevated white blood cell count, unspecified: Secondary | ICD-10-CM | POA: Diagnosis not present

## 2019-08-06 LAB — CBC
HCT: 44.5 % (ref 36.0–46.0)
Hemoglobin: 14.7 g/dL (ref 12.0–15.0)
MCHC: 33.2 g/dL (ref 30.0–36.0)
MCV: 94.7 fl (ref 78.0–100.0)
Platelets: 414 10*3/uL — ABNORMAL HIGH (ref 150.0–400.0)
RBC: 4.7 Mil/uL (ref 3.87–5.11)
RDW: 14 % (ref 11.5–15.5)
WBC: 11.3 10*3/uL — ABNORMAL HIGH (ref 4.0–10.5)

## 2019-08-06 MED FILL — SHINGRIX 50 MCG SUS: 50 | 1 days supply | Qty: 1 | Fill #0

## 2019-08-07 ENCOUNTER — Encounter: Payer: Self-pay | Admitting: Family Medicine

## 2019-08-10 MED ORDER — SPIRIVA HANDIHALER 18 MCG IN CAPS: 18.0000 ug | ORAL_CAPSULE | Freq: Every day | RESPIRATORY_TRACT | 12 refills | Status: DC

## 2019-08-10 NOTE — Addendum Note (Signed)
Addended by: Lamar Blinks C on: 08/10/2019 12:51 PM   Modules accepted: Orders

## 2019-08-18 ENCOUNTER — Other Ambulatory Visit: Payer: Self-pay

## 2019-08-18 ENCOUNTER — Emergency Department (HOSPITAL_BASED_OUTPATIENT_CLINIC_OR_DEPARTMENT_OTHER)
Admission: EM | Admit: 2019-08-18 | Discharge: 2019-08-18 | Disposition: A | Discharge status: Home / Self Care | Payer: Medicare Other | Attending: Emergency Medicine | Admitting: Emergency Medicine

## 2019-08-18 ENCOUNTER — Emergency Department (HOSPITAL_BASED_OUTPATIENT_CLINIC_OR_DEPARTMENT_OTHER): Payer: Medicare Other

## 2019-08-18 ENCOUNTER — Encounter (HOSPITAL_BASED_OUTPATIENT_CLINIC_OR_DEPARTMENT_OTHER): Payer: Self-pay

## 2019-08-18 DIAGNOSIS — K5730 Diverticulosis of large intestine without perforation or abscess without bleeding: Secondary | ICD-10-CM | POA: Diagnosis not present

## 2019-08-18 DIAGNOSIS — R1012 Left upper quadrant pain: Secondary | ICD-10-CM | POA: Diagnosis not present

## 2019-08-18 DIAGNOSIS — F17210 Nicotine dependence, cigarettes, uncomplicated: Secondary | ICD-10-CM | POA: Diagnosis not present

## 2019-08-18 DIAGNOSIS — K5909 Other constipation: Secondary | ICD-10-CM | POA: Diagnosis not present

## 2019-08-18 DIAGNOSIS — Z79891 Long term (current) use of opiate analgesic: Secondary | ICD-10-CM | POA: Insufficient documentation

## 2019-08-18 DIAGNOSIS — Z79899 Other long term (current) drug therapy: Secondary | ICD-10-CM | POA: Diagnosis not present

## 2019-08-18 DIAGNOSIS — K5900 Constipation, unspecified: Secondary | ICD-10-CM | POA: Diagnosis not present

## 2019-08-18 DIAGNOSIS — R109 Unspecified abdominal pain: Secondary | ICD-10-CM | POA: Diagnosis not present

## 2019-08-18 DIAGNOSIS — F1190 Opioid use, unspecified, uncomplicated: Secondary | ICD-10-CM

## 2019-08-18 LAB — CBC WITH DIFFERENTIAL/PLATELET
Abs Immature Granulocytes: 0.07 10*3/uL (ref 0.00–0.07)
Basophils Absolute: 0.1 10*3/uL (ref 0.0–0.1)
Basophils Relative: 1 %
Eosinophils Absolute: 0.1 10*3/uL (ref 0.0–0.5)
Eosinophils Relative: 1 %
HCT: 48.7 % — ABNORMAL HIGH (ref 36.0–46.0)
Hemoglobin: 16.3 g/dL — ABNORMAL HIGH (ref 12.0–15.0)
Immature Granulocytes: 0 %
Lymphocytes Relative: 21 %
Lymphs Abs: 3.7 10*3/uL (ref 0.7–4.0)
MCH: 31.5 pg (ref 26.0–34.0)
MCHC: 33.5 g/dL (ref 30.0–36.0)
MCV: 94 fL (ref 80.0–100.0)
Monocytes Absolute: 1 10*3/uL (ref 0.1–1.0)
Monocytes Relative: 6 %
Neutro Abs: 12.7 10*3/uL — ABNORMAL HIGH (ref 1.7–7.7)
Neutrophils Relative %: 71 %
Platelets: 443 10*3/uL — ABNORMAL HIGH (ref 150–400)
RBC: 5.18 MIL/uL — ABNORMAL HIGH (ref 3.87–5.11)
RDW: 13.3 % (ref 11.5–15.5)
WBC: 17.6 10*3/uL — ABNORMAL HIGH (ref 4.0–10.5)
nRBC: 0 % (ref 0.0–0.2)

## 2019-08-18 LAB — URINALYSIS, ROUTINE W REFLEX MICROSCOPIC
Glucose, UA: NEGATIVE mg/dL
Hgb urine dipstick: NEGATIVE
Ketones, ur: NEGATIVE mg/dL
Leukocytes,Ua: NEGATIVE
Nitrite: NEGATIVE
Protein, ur: NEGATIVE mg/dL
Specific Gravity, Urine: >1.03 — ABNORMAL HIGH (ref 1.005–1.030)
pH: 5.5 (ref 5.0–8.0)

## 2019-08-18 LAB — COMPREHENSIVE METABOLIC PANEL
ALT: 31 U/L (ref 0–44)
AST: 27 U/L (ref 15–41)
Albumin: 4.9 g/dL (ref 3.5–5.0)
Alkaline Phosphatase: 53 U/L (ref 38–126)
Anion gap: 10 (ref 5–15)
BUN: 9 mg/dL (ref 6–20)
CO2: 22 mmol/L (ref 22–32)
Calcium: 9.7 mg/dL (ref 8.9–10.3)
Chloride: 105 mmol/L (ref 98–111)
Creatinine, Ser: 0.72 mg/dL (ref 0.44–1.00)
GFR calc Af Amer: >60 mL/min (ref >60)
GFR calc non Af Amer: >60 mL/min (ref >60)
Glucose, Bld: 114 mg/dL — ABNORMAL HIGH (ref 70–99)
Potassium: 4 mmol/L (ref 3.5–5.1)
Sodium: 137 mmol/L (ref 135–145)
Total Bilirubin: 0.3 mg/dL (ref 0.3–1.2)
Total Protein: 7.9 g/dL (ref 6.5–8.1)

## 2019-08-18 LAB — LIPASE, BLOOD: Lipase: 24 U/L (ref 11–51)

## 2019-08-18 MED ORDER — METOCLOPRAMIDE HCL 5 MG/ML IJ SOLN
10.0000 mg | Freq: Once | INTRAMUSCULAR | Status: AC
  Administered 2019-08-18: 15:00:00 10 mg via INTRAVENOUS
  Filled 2019-08-18: qty 2

## 2019-08-18 MED ORDER — MORPHINE SULFATE (PF) 4 MG/ML IV SOLN
4.0000 mg | Freq: Once | INTRAVENOUS | Status: AC
  Administered 2019-08-18: 4 mg via INTRAVENOUS
  Filled 2019-08-18: qty 1

## 2019-08-18 MED ORDER — IOHEXOL 300 MG/ML  SOLN
100.0000 mL | Freq: Once | INTRAMUSCULAR | Status: AC | PRN
  Administered 2019-08-18: 100 mL via INTRAVENOUS

## 2019-08-18 NOTE — ED Provider Notes (Signed)
[facility] EMERGENCY DEPARTMENT Provider Note   CSN: [id number] Arrival date & time: 08/18/19  1405     History Chief Complaint  Patient presents with  . Abdominal Pain    Susan Cortez is a 58 y.o. female with history of chronic pain on pain regimen, tobacco use presents to the ER for evaluation of abdominal pain associated with nausea.  Onset on Sunday.  Located to the left lateral mid abdomen, described as sharp, constant, 8/10.  It is significantly worse with any movement like bending down, walking, sneezing or twisting movements.  She thinks she pulled a muscle because the pain began on Sunday after she had a bowl of cereal and milk.  States she usually cannot tolerate milk very well and she threw up soon after eating which is when the left abdominal pain began.  Reports long history of chronic constipation due to chronic Tylenol No. 4's for her chronic pain.  States it is normal for her to go 2 weeks without any bowel movements.  She had a small marble sized bowel movement on Sunday prior to symptom onset, before that she had not had a bowel movement for 1 week.  Is still passing small amount of gas.  She has tried to induce a bowel movement with MiraLAX, stool softener and 3 hours ago she took magnesium citrate.  Has had to use this type of regimen in the past to induce a bowel movement typically worse within 30 minutes but he has not worked this time.  History of chronic urinary frequency and states she has been urinating less.  No fever, vomiting.  No dysuria, hematuria.  There is no radiation of pain in the chest, shortness of breath.  History of colonic polyps on previous colonoscopies.  History of C-section but no other abdominal surgeries.  No history of kidney stones.HPI     Past Medical History:  Diagnosis Date  . Allergy   . Arthritis   . Basal cell carcinoma 2018   Removed from Right neck per patient     Patient Active Problem List   Diagnosis Date Noted   . Prediabetes 05/31/2019  . Dyslipidemia 03/01/2018  . De Quervain's disease (tenosynovitis) 03/28/2017  . Chronic bilateral low back pain without sciatica 03/28/2017  . Sebaceous hyperplasia of face 06/26/2016  . Chronic right shoulder pain 01/02/2016  . Adenoma of left adrenal gland 12/26/2015  . Intermittent claudication ([place]) 12/26/2015  . Rotator cuff (capsule) sprain 03/26/2014  . Spinal stenosis in cervical region 03/26/2014  . Chronic pain 03/26/2014    Past Surgical History:  Procedure Laterality Date  . CESAREAN SECTION     X 3  . DILATION AND CURETTAGE OF UTERUS    . SHOULDER ARTHROSCOPY WITH OPEN ROTATOR CUFF REPAIR Right 05/2009  . TONSILLECTOMY  1977  . TUBAL LIGATION       OB History    Gravida  4   Para  3   Term      Preterm      AB  1   Living  3     SAB  1   TAB      Ectopic      Multiple      Live Births  3           Family History  Problem Relation Age of Onset  . Diabetes Father   . Heart disease Father   . Hyperlipidemia Father     Social History  Tobacco Use  . Smoking status: Current Every Day Smoker    Packs/day: 1.00    Years: 40.00    Pack years: 40.00  . Smokeless tobacco: Never Used  . Tobacco comment: pt declines  Substance Use Topics  . Alcohol use: No  . Drug use: No    Home Medications Prior to Admission medications   Medication Sig Start Date End Date Taking? Authorizing Provider  acetaminophen-codeine (TYLENOL #4) 300-60 MG tablet Take 1 tablet by mouth daily. 07/14/19  Yes Bayard Hugger, NP  Calcium-Magnesium-Vitamin D (CALCIUM 500 PO) Take by mouth.   Yes [provider]  cholecalciferol (VITAMIN D) 1000 units tablet Take 1,000 Units by mouth daily.   Yes [provider]  lovastatin (MEVACOR) 20 MG tablet TAKE 1 TABLET(20 MG) BY MOUTH AT BEDTIME 06/03/19  Yes Copland, Gay Filler, MD  mirabegron ER (MYRBETRIQ) 50 MG TB24 tablet Take 1 tablet (50 mg total) by mouth daily. 12/10/18   Yes Copland, Gay Filler, MD  polyethylene glycol powder (GLYCOLAX/MIRALAX) powder Take 17 g 2 (two) times daily as needed by mouth. Use for constipation 07/10/17  Yes Copland, Gay Filler, MD  tiotropium (SPIRIVA HANDIHALER) 18 MCG inhalation capsule Place 1 capsule (18 mcg total) into inhaler and inhale daily. 08/10/19  Yes Copland, Gay Filler, MD    Allergies    Chantix [varenicline], Diclofenac, Meloxicam, Butrans [buprenorphine], Lyrica [pregabalin], Nsaids, and Tolmetin  Review of Systems   Review of Systems  Gastrointestinal: Positive for abdominal pain and nausea.  Genitourinary: Positive for flank pain.  All other systems reviewed and are negative.   Physical Exam Updated Vital Signs BP 133/78 (BP Location: Left Arm)   Pulse 71   Temp 98.1 F (36.7 C) (Oral)   Resp 14   Ht 5\' 8"  (1.727 m)   Wt 97.5 kg   SpO2 100%   BMI 32.69 kg/m   Physical Exam Vitals and nursing note reviewed.  Constitutional:      Appearance: She is well-developed.     Comments: Non toxic in NAD  HENT:     Head: Normocephalic and atraumatic.     Nose: Nose normal.  Eyes:     Conjunctiva/sclera: Conjunctivae normal.  Cardiovascular:     Rate and Rhythm: Normal rate and regular rhythm.  Pulmonary:     Effort: Pulmonary effort is normal.     Breath sounds: Normal breath sounds.  Abdominal:     General: Bowel sounds are normal.     Palpations: Abdomen is soft.     Tenderness: There is abdominal tenderness.       Comments: Left CVA tenderness.  Left lateral and mid/frontal abdominal tenderness.  Skin is normal over this area.  Suprapubic tenderness.  Negative Murphy's and McBurney's.  Obese, soft abdomen.  No G/R/R.  Difficult to auscultate BS due to body habitus.  Musculoskeletal:        General: Normal range of motion.     Cervical back: Normal range of motion.       Back:     Comments: Left-sided mid abdominal pain reproducible with sitting up, trunk movements and left leg raise.  No  midline T L-spine tenderness.  Skin:    General: Skin is warm and dry.     Capillary Refill: Capillary refill takes less than 2 seconds.  Neurological:     Mental Status: She is alert.  Psychiatric:        Behavior: Behavior normal.     ED Results /  Procedures / Treatments   Labs (all labs ordered are listed, but only abnormal results are displayed) Labs Reviewed  CBC WITH DIFFERENTIAL/PLATELET - Abnormal; Notable for the following components:      Result Value   WBC 17.6 (*)    RBC 5.18 (*)    Hemoglobin 16.3 (*)    HCT 48.7 (*)    Platelets 443 (*)    Neutro Abs 12.7 (*)    All other components within normal limits  COMPREHENSIVE METABOLIC PANEL - Abnormal; Notable for the following components:   Glucose, Bld 114 (*)    All other components within normal limits  URINALYSIS, ROUTINE W REFLEX MICROSCOPIC - Abnormal; Notable for the following components:   Specific Gravity, Urine >1.030 (*)    Bilirubin Urine SMALL (*)    All other components within normal limits  URINE CULTURE  LIPASE, BLOOD    EKG None  Radiology CT ABDOMEN PELVIS W CONTRAST  Result Date: 08/18/2019 CLINICAL DATA:  58 year old female with left-sided abdomen/flank pain. EXAM: CT ABDOMEN AND PELVIS WITH CONTRAST TECHNIQUE: Multidetector CT imaging of the abdomen and pelvis was performed using the standard protocol following bolus administration of intravenous contrast. CONTRAST:  152mL OMNIPAQUE IOHEXOL 300 MG/ML  SOLN COMPARISON:  CT of the abdomen pelvis dated 02/15/2015. FINDINGS: Lower chest: The visualized lung bases are clear. No intra-abdominal free air or free fluid. Hepatobiliary: Diffuse fatty infiltration of the liver. No intrahepatic biliary ductal dilatation. The gallbladder is mildly distended. No calcified gallstone or pericholecystic fluid. Pancreas: Unremarkable. No pancreatic ductal dilatation or surrounding inflammatory changes. Spleen: Normal in size without focal abnormality.  Adrenals/Urinary Tract: The right adrenal gland is unremarkable. Two left adrenal nodules measuring up to 17 mm, indeterminate and not characterized on this CT but no significant interval change since the CT of 2016 and previously suggested to represent adenoma or myelolipoma. The kidneys are unremarkable. There is symmetric enhancement and excretion of contrast by both kidneys. The visualized ureters appear unremarkable. The urinary bladder is minimally distended. There is slight thickened trabeculated appearance of the bladder wall. A punctate focus of air in the anterior bladder of indeterminate etiology but may be introduced via recent instrumentation. Correlation with urinalysis recommended to exclude cystitis. Stomach/Bowel: There is liquid stool throughout the colon compatible with diarrheal state. Correlation with clinical exam and stool cultures recommended. There is sigmoid diverticulosis without active inflammatory changes. There is no bowel obstruction. The appendix is unremarkable as visualized. Vascular/Lymphatic: Mild aortoiliac atherosclerotic disease. 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 No portal venous gas. There is no adenopathy. Reproductive: The uterus is grossly unremarkable. No adnexal masses. Other: None Musculoskeletal: Degenerative changes of the spine. No acute osseous pathology. IMPRESSION: 1. Diarrheal state. Correlation with clinical exam and stool cultures recommended. No bowel obstruction. Normal appendix. 2. Sigmoid diverticulosis. 3. Fatty liver. 4. Aortic Atherosclerosis (ICD10-I70.0). Electronically Signed   By: Anner Crete M.D.   On: 08/18/2019 16:22    Procedures Procedures (including critical care time)  Medications Ordered in ED Medications  metoCLOPramide (REGLAN) injection 10 mg (10 mg Intravenous Given 08/18/19 1505)  morphine 4 MG/ML injection 4 mg (4 mg Intravenous Given 08/18/19 1508)  iohexol (OMNIPAQUE) 300 MG/ML solution 100 mL (100 mLs Intravenous  Contrast Given 08/18/19 1549)    ED Course  I have reviewed the triage vital signs and the nursing notes.  Pertinent labs & imaging results that were available during my care of the patient were reviewed by me and considered in my medical decision making (  see chart for details).  Clinical Course as of Aug 17 1705  Tue Aug 18, 2019  1524 WBC(!): 17.6 [CG]  1525 Hemoglobin(!): 16.3 [CG]  1525 HCT(!): 48.7 [CG]  1525 Platelets(!): 443 [CG]  1525 NEUT#(!): 12.7 [CG]  1525 Hgb urine dipstick: NEGATIVE [CG]  1525 Nitrite: NEGATIVE [CG]  1525 Leukocytes,Ua: NEGATIVE [CG]  1659 Pt re-evaluated, just had large watery BM in bathroom and feels "much better". No nausea.    [CG]    Clinical Course User Index [CG] Arlean Hopping   MDM Rules/Calculators/A&P                      EMR reviewed.  DDX includes muscular strain from forceful vomiting could definitely be contributing however she reports nausea, marble sized BM in the last 7 days with minimal flatus.  Sounds like she has tried a lot of medicines to induce a bowel movement including MiraLAX, stool softener, mag citrate without any improvement.  Also considering complete/partial SBO given her symptoms, chronic opioid use.  History of kidney stones, urinary symptoms otherwise but location of pain could suggest GU process.  No rash.  No associated pain in the chest, shortness of breath.  Labs including urinalysis, Reglan, morphine ordered.  Will reassess  1540: ER work-up personally reviewed remarkable as above.  Leukocytosis with left shift, some hemoconcentration.  Labs otherwise benign.  UA is essentially normal.  Patient reevaluated and reports moderate improvement in pain.  Given initial tachycardia 110, leukocytosis no overall clinical presentation feel CT abdomen pelvis is warranted also to evaluate colon, SBO, ileus.  1704: CT abdomen shows liquid stool in colon possibly suggesting diarrheal state.  On reevaluation patient  had just had a large watery BM and had significant improvement in pain and symptoms overall.  Incidentally noted thickened bladder but UA is not consistent with infection she has no urinary infection symptoms, urine culture sent.  She is aware of adrenal nodules noted.  Suspect pain due to constipation, possibly abdominal wall muscular strain or both.  Recommended daily stool softener given her chronic opioid use, diet modifications.  Return precautions given.  She is comfortable discharge Final Clinical Impression(s) / ED Diagnoses Final diagnoses:  Left lateral abdominal pain  Chronic constipation  Chronic, continuous use of opioids    Rx / DC Orders ED Discharge Orders    None       Kinnie Feil, PA-C 08/18/19 1706    Gareth Morgan, MD 08/18/19 2106

## 2019-08-18 NOTE — Discharge Instructions (Signed)
You were seen in the ER for abdominal pain, nausea, constipation  I suspect your symptoms are from constipation and/or abdominal muscle strain  ER work up today including labs, urine and CT were reassuring  I recommend a daily stool softener with your tylenol #4s to prevent chronic constipation  Return for worsening symptoms, fever greater than 100, vomiting, worsening abdominal pain, no bowel movements or gas, blood in stool, heavy bloody diarrhea

## 2019-08-18 NOTE — ED Triage Notes (Signed)
Pt c/o pain to left side abd/flank x 2 days-vomited x 1 two days ago-denies diarrhea-NAD-steady gait

## 2019-08-19 ENCOUNTER — Encounter: Payer: Self-pay | Admitting: Family Medicine

## 2019-08-19 DIAGNOSIS — D72829 Elevated white blood cell count, unspecified: Secondary | ICD-10-CM

## 2019-08-19 LAB — URINE CULTURE

## 2019-08-31 ENCOUNTER — Encounter: Payer: Self-pay | Admitting: Hematology & Oncology

## 2019-08-31 ENCOUNTER — Inpatient Hospital Stay: Payer: Medicare Other | Attending: Hematology & Oncology

## 2019-08-31 ENCOUNTER — Inpatient Hospital Stay: Payer: Medicare Other | Admitting: Hematology & Oncology

## 2019-08-31 ENCOUNTER — Inpatient Hospital Stay (HOSPITAL_BASED_OUTPATIENT_CLINIC_OR_DEPARTMENT_OTHER): Payer: Medicare Other | Admitting: Hematology & Oncology

## 2019-08-31 ENCOUNTER — Inpatient Hospital Stay: Payer: Medicare Other

## 2019-08-31 ENCOUNTER — Other Ambulatory Visit: Payer: Self-pay

## 2019-08-31 VITALS — BP 135/83 | HR 89 | Temp 97.7°F | Resp 18 | Wt 212.0 lb

## 2019-08-31 DIAGNOSIS — D72823 Leukemoid reaction: Secondary | ICD-10-CM

## 2019-08-31 DIAGNOSIS — R531 Weakness: Secondary | ICD-10-CM | POA: Diagnosis not present

## 2019-08-31 DIAGNOSIS — Z85828 Personal history of other malignant neoplasm of skin: Secondary | ICD-10-CM | POA: Insufficient documentation

## 2019-08-31 DIAGNOSIS — R5383 Other fatigue: Secondary | ICD-10-CM | POA: Diagnosis not present

## 2019-08-31 DIAGNOSIS — M129 Arthropathy, unspecified: Secondary | ICD-10-CM | POA: Diagnosis not present

## 2019-08-31 DIAGNOSIS — F17210 Nicotine dependence, cigarettes, uncomplicated: Secondary | ICD-10-CM | POA: Insufficient documentation

## 2019-08-31 DIAGNOSIS — Z79899 Other long term (current) drug therapy: Secondary | ICD-10-CM | POA: Insufficient documentation

## 2019-08-31 DIAGNOSIS — D72829 Elevated white blood cell count, unspecified: Secondary | ICD-10-CM | POA: Insufficient documentation

## 2019-08-31 LAB — CMP (CANCER CENTER ONLY)
ALT: 21 U/L (ref 0–44)
AST: 18 U/L (ref 15–41)
Albumin: 4.9 g/dL (ref 3.5–5.0)
Alkaline Phosphatase: 48 U/L (ref 38–126)
Anion gap: 7 (ref 5–15)
BUN: 11 mg/dL (ref 6–20)
CO2: 29 mmol/L (ref 22–32)
Calcium: 10.3 mg/dL (ref 8.9–10.3)
Chloride: 103 mmol/L (ref 98–111)
Creatinine: 0.83 mg/dL (ref 0.44–1.00)
GFR, Est AFR Am: >60 mL/min (ref >60)
GFR, Estimated: >60 mL/min (ref >60)
Glucose, Bld: 104 mg/dL — ABNORMAL HIGH (ref 70–99)
Potassium: 4.3 mmol/L (ref 3.5–5.1)
Sodium: 139 mmol/L (ref 135–145)
Total Bilirubin: 0.4 mg/dL (ref 0.3–1.2)
Total Protein: 7.5 g/dL (ref 6.5–8.1)

## 2019-08-31 LAB — CBC WITH DIFFERENTIAL (CANCER CENTER ONLY)
Abs Immature Granulocytes: 0.03 10*3/uL (ref 0.00–0.07)
Basophils Absolute: 0.1 10*3/uL (ref 0.0–0.1)
Basophils Relative: 1 %
Eosinophils Absolute: 0.1 10*3/uL (ref 0.0–0.5)
Eosinophils Relative: 1 %
HCT: 45.7 % (ref 36.0–46.0)
Hemoglobin: 15.4 g/dL — ABNORMAL HIGH (ref 12.0–15.0)
Immature Granulocytes: 0 %
Lymphocytes Relative: 28 %
Lymphs Abs: 3.7 10*3/uL (ref 0.7–4.0)
MCH: 31.7 pg (ref 26.0–34.0)
MCHC: 33.7 g/dL (ref 30.0–36.0)
MCV: 94 fL (ref 80.0–100.0)
Monocytes Absolute: 0.8 10*3/uL (ref 0.1–1.0)
Monocytes Relative: 6 %
Neutro Abs: 8.6 10*3/uL — ABNORMAL HIGH (ref 1.7–7.7)
Neutrophils Relative %: 64 %
Platelet Count: 422 10*3/uL — ABNORMAL HIGH (ref 150–400)
RBC: 4.86 MIL/uL (ref 3.87–5.11)
RDW: 13.2 % (ref 11.5–15.5)
WBC Count: 13.3 10*3/uL — ABNORMAL HIGH (ref 4.0–10.5)
nRBC: 0 % (ref 0.0–0.2)

## 2019-08-31 LAB — SAVE SMEAR(SSMR), FOR PROVIDER SLIDE REVIEW

## 2019-08-31 NOTE — Progress Notes (Signed)
Referral MD  Reason for Referral: Mild leukocytosis-likely reactive  Chief Complaint  Patient presents with  . Follow-up  : I just feel tired all the time.  HPI: [name] is a very charming 59 year old white female.  She is originally from [place].  She has been down in [place] for the past 6 years.  She has been on disability for several years.  She does smoke 1 pack of cigarettes per day.  She has about a 45-pack-year history of tobacco use.  She has had multiple surgeries.  She has gone through surgeries.  She had a CT scan done of the lungs back in October 2020.  Everything looked fine on the CT scan.  Patient has CT scan of the abdomen pelvis in December 2020.  Again nothing looked suspicious.  She has been noted to have mild leukocytosis and thrombocytosis.  Going back to 2 years ago, her white cell count was slightly elevated.  Her platelet count was also slightly elevated.  Her blood counts have fluctuated.  In May 2019, her white cell count was 9.1.  Hemoglobin 14.3.  Platelet count 339,000.  In October 2020, her white count was 18,000.  Hemoglobin 15.4.  Platelet count 4-16,000.  In December 2020, her white count was 11.3.  Hemoglobin 14.7.  Platelet count 414,000.  About 12 days later white cell count was up to 17.6.  She is has always had a normal white blood cell differential.  We were asked to see her to see if there is any underlying hematologic issue that might be causing the leukocytosis.  She is not lost weight.  She feels tired all the time.  I am not sure as to why she would feel tired all the time.  She has had no headaches.  She has had no rashes.  There is been no nausea or vomiting.  She has no family history of malignancy.  There is no family history of any kind of blood problems.  Overall, I would say her performance status is ECOG 1.   Past Medical History:  Diagnosis Date  . Allergy   . Arthritis   . Basal cell carcinoma 2018   Removed from Right neck per patient   :  Past Surgical History:  Procedure Laterality Date  . CESAREAN SECTION     X 3  . DILATION AND CURETTAGE OF UTERUS    . SHOULDER ARTHROSCOPY WITH OPEN ROTATOR CUFF REPAIR Right 05/2009  . TONSILLECTOMY  1977  . TUBAL LIGATION    :   Current Outpatient Medications:  .  acetaminophen-codeine (TYLENOL #4) 300-60 MG tablet, Take 1 tablet by mouth daily., Disp: 30 tablet, Rfl: 2 .  Calcium-Magnesium-Vitamin D (CALCIUM 500 PO), Take by mouth., Disp: , Rfl:  .  cholecalciferol (VITAMIN D) 1000 units tablet, Take 1,000 Units by mouth daily., Disp: , Rfl:  .  lovastatin (MEVACOR) 20 MG tablet, TAKE 1 TABLET(20 MG) BY MOUTH AT BEDTIME, Disp: 90 tablet, Rfl: 3 .  mirabegron ER (MYRBETRIQ) 50 MG TB24 tablet, Take 1 tablet (50 mg total) by mouth daily., Disp: 90 tablet, Rfl: 3 .  polyethylene glycol powder (GLYCOLAX/MIRALAX) powder, Take 17 g 2 (two) times daily as needed by mouth. Use for constipation, Disp: 3350 g, Rfl: 6 .  tiotropium (SPIRIVA HANDIHALER) 18 MCG inhalation capsule, Place 1 capsule (18 mcg total) into inhaler and inhale daily., Disp: 30 capsule, Rfl: 12:  :  Allergies  Allergen Reactions  . Chantix [Varenicline] Other (See  Comments)    Strange dreams/hallucinations  . Diclofenac Swelling  . Meloxicam Swelling  . Butrans [Buprenorphine] Itching    Itching and redness at the area of the patch.  Recardo Evangelist [Pregabalin]     Headache,severe eye pain, swelling  . Nsaids     Stomach pain  . Tolmetin Other (See Comments)    Stomach pain  :  Family History  Problem Relation Age of Onset  . Diabetes Father   . Heart disease Father   . Hyperlipidemia Father   :  Social History   Socioeconomic History  . Marital status: Divorced    Spouse name: Not on file  . Number of children: Not on file  . Years of education: Not on file  . Highest education level: Not on file  Occupational History  . Not on file  Tobacco Use  . Smoking  status: Current Every Day Smoker    Packs/day: 1.00    Years: 40.00    Pack years: 40.00  . Smokeless tobacco: Never Used  . Tobacco comment: pt declines  Substance and Sexual Activity  . Alcohol use: No  . Drug use: No  . Sexual activity: Not on file  Other Topics Concern  . Not on file  Social History Narrative  . Not on file   Social Determinants of Health   Financial Resource Strain:   . Difficulty of Paying Living Expenses: Not on file  Food Insecurity:   . Worried About Charity fundraiser in the Last Year: Not on file  . Ran Out of Food in the Last Year: Not on file  Transportation Needs:   . Lack of Transportation (Medical): Not on file  . Lack of Transportation (Non-Medical): Not on file  Physical Activity:   . Days of Exercise per Week: Not on file  . Minutes of Exercise per Session: Not on file  Stress:   . Feeling of Stress : Not on file  Social Connections:   . Frequency of Communication with Friends and Family: Not on file  . Frequency of Social Gatherings with Friends and Family: Not on file  . Attends Religious Services: Not on file  . Active Member of Clubs or Organizations: Not on file  . Attends Archivist Meetings: Not on file  . Marital Status: Not on file  Intimate Partner Violence:   . Fear of Current or Ex-Partner: Not on file  . Emotionally Abused: Not on file  . Physically Abused: Not on file  . Sexually Abused: Not on file  :  Review of Systems  Constitutional: Positive for malaise/fatigue.  HENT: Negative.   Eyes: Negative.   Respiratory: Negative.   Cardiovascular: Negative.   Gastrointestinal: Negative.   Genitourinary: Negative.   Musculoskeletal: Positive for myalgias.  Skin: Negative.   Neurological: Negative.   Endo/Heme/Allergies: Negative.   Psychiatric/Behavioral: Negative.      Exam: This is a well-developed and well-nourished white female in no obvious distress.  Vital signs show temperature of 97.7.  Pulse  89.  Blood pressure 135/83.  Weight is 212 pounds.  Head neck exam shows no ocular or oral lesions.  She has no scleral icterus.  She has no adenopathy in the neck.  There is no palpable thyroid.  Lungs are clear bilaterally.  She has no wheezes bilaterally.  Cardiac exam regular rate and rhythm with no murmurs, rubs or bruits.  Abdomen is soft.  She has good bowel sounds.  There is no fluid wave.  There is no palpable liver or spleen tip.  Back exam shows no tenderness over the spine, ribs or hips.  Extremities shows no clubbing, cyanosis or edema.  She has good range of motion of her joints.  She has good strength in her upper and lower extremities.  Skin exam shows no rashes, ecchymoses or petechia.  Neurological exam shows no focal neurological deficits.     '@IPVITALS' @   Recent Labs    08/31/19 1429  WBC 13.3*  HGB 15.4*  HCT 45.7  PLT 422*   Recent Labs    08/31/19 1429  NA 139  K 4.3  CL 103  CO2 29  GLUCOSE 104*  BUN 11  CREATININE 0.83  CALCIUM 10.3    Blood smear review: Normochromic and normocytic population of red blood cells.  There is no nucleated red blood cells.  I see no teardrop cells.  She has no rouleaux formation.  I see no schistocytes or spherocytes.  White blood cells appear normal in morphology and maturation.  She has no hypersegmented polys.  There is no immature myeloid or lymphoid forms.  Platelets are adequate number and size.  I really do not see any large platelets.  Platelets are well granulated.  Pathology: None    Assessment and Plan: [name] is a very charming 59 year old white female.  Actually, her birthday is on [birth year] Day.  She has transient leukocytosis.  I do not see anything on the blood smear that looks like a hematologic malignancy.  I do not think there is anything that would suggest a chronic myeloid leukemia.  I will see anything that looks like chronic lymphocytic leukemia.  It is possible that the smoking might have  some to do with the leukocytosis and thrombocytosis.  We could be seen a reactive process.  I do not see anything that would suggest an underlying malignancy with respect to her smoking.  I suppose that she might have a myeloproliferative disorder.  Again I would be surprised if this were the case.  However, we might want to think about sending off a JAK 2 assay on her but we see her back.  I do not see a need for a bone marrow biopsy.  I just think that this would be very low yield.  I spent about 45 minutes with Ms. Hardin Negus.  She is very nice.  It was fun talking to her about [place].  She has some good stories to tell.  We will plan to get her back to see [place] in another 2 months or so for follow-up.  If everything looks stable at that time, then we might let her go from the practice.

## 2019-09-01 ENCOUNTER — Telehealth: Payer: Self-pay | Admitting: Hematology & Oncology

## 2019-09-01 LAB — LACTATE DEHYDROGENASE: LDH: 251 U/L — ABNORMAL HIGH (ref 98–192)

## 2019-09-01 NOTE — Telephone Encounter (Signed)
Appointments scheduled letter/calendar mailed per 1/4 los

## 2019-09-03 ENCOUNTER — Ambulatory Visit: Payer: Medicare Other | Admitting: Hematology & Oncology

## 2019-09-03 ENCOUNTER — Inpatient Hospital Stay: Payer: Medicare Other

## 2019-10-19 ENCOUNTER — Encounter: Payer: Medicare Other | Attending: Registered Nurse | Admitting: Registered Nurse

## 2019-10-19 ENCOUNTER — Encounter: Payer: Self-pay | Admitting: Registered Nurse

## 2019-10-19 ENCOUNTER — Other Ambulatory Visit: Payer: Self-pay

## 2019-10-19 VITALS — BP 124/86 | HR 99 | Temp 98.6°F | Ht 68.0 in | Wt 212.6 lb

## 2019-10-19 DIAGNOSIS — Z760 Encounter for issue of repeat prescription: Secondary | ICD-10-CM | POA: Insufficient documentation

## 2019-10-19 DIAGNOSIS — M75101 Unspecified rotator cuff tear or rupture of right shoulder, not specified as traumatic: Secondary | ICD-10-CM | POA: Diagnosis not present

## 2019-10-19 DIAGNOSIS — Z5181 Encounter for therapeutic drug level monitoring: Secondary | ICD-10-CM | POA: Diagnosis not present

## 2019-10-19 DIAGNOSIS — M4802 Spinal stenosis, cervical region: Secondary | ICD-10-CM | POA: Insufficient documentation

## 2019-10-19 DIAGNOSIS — G894 Chronic pain syndrome: Secondary | ICD-10-CM | POA: Diagnosis not present

## 2019-10-19 DIAGNOSIS — G8929 Other chronic pain: Secondary | ICD-10-CM | POA: Diagnosis not present

## 2019-10-19 DIAGNOSIS — M1990 Unspecified osteoarthritis, unspecified site: Secondary | ICD-10-CM | POA: Insufficient documentation

## 2019-10-19 DIAGNOSIS — M546 Pain in thoracic spine: Secondary | ICD-10-CM

## 2019-10-19 DIAGNOSIS — F17210 Nicotine dependence, cigarettes, uncomplicated: Secondary | ICD-10-CM | POA: Insufficient documentation

## 2019-10-19 DIAGNOSIS — M25511 Pain in right shoulder: Secondary | ICD-10-CM | POA: Diagnosis not present

## 2019-10-19 DIAGNOSIS — Z79891 Long term (current) use of opiate analgesic: Secondary | ICD-10-CM | POA: Diagnosis not present

## 2019-10-19 DIAGNOSIS — M542 Cervicalgia: Secondary | ICD-10-CM | POA: Diagnosis not present

## 2019-10-19 DIAGNOSIS — Z85828 Personal history of other malignant neoplasm of skin: Secondary | ICD-10-CM | POA: Diagnosis not present

## 2019-10-19 DIAGNOSIS — G4700 Insomnia, unspecified: Secondary | ICD-10-CM | POA: Diagnosis not present

## 2019-10-19 DIAGNOSIS — M545 Low back pain: Secondary | ICD-10-CM | POA: Insufficient documentation

## 2019-10-19 MED ORDER — ACETAMINOPHEN-CODEINE #4 300-60 MG PO TABS: 1.0000 | ORAL_TABLET | Freq: Every day | ORAL | 2 refills | Status: DC

## 2019-10-19 NOTE — Progress Notes (Signed)
Subjective:    Patient ID: Susan Cortez, female    DOB: [birth year], 59 y.o.   MRN: [id number]  HPI: Susan Cortez is a 59 y.o. female who returns for follow up appointment for chronic pain and medication refill. She states her pain is located in her neck radiating into her right shoulder. She rates her pain 7. Her current exercise regime is walking and performing stretching exercises.  Susan Cortez Morphine equivalent is 9.00  MME.  Last UDS was Performed on 07/14/2019, it was consistent.   Pain Inventory Average Pain 5 Pain Right Now 7 My pain is burning, stabbing and aching  In the last 24 hours, has pain interfered with the following? General activity 2 Relation with others 2 Enjoyment of life 2 What TIME of day is your pain at its worst? daytime Sleep (in general) Poor  Pain is worse with: standing and some activites Pain improves with: rest, pacing activities and medication Relief from Meds: 7  Mobility Do you have any goals in this area?  no  Function Do you have any goals in this area?  no  Neuro/Psych No problems in this area  Prior Studies Any changes since last visit?  no  Physicians involved in your care Any changes since last visit?  no   Family History  Problem Relation Age of Onset  . Diabetes Father   . Heart disease Father   . Hyperlipidemia Father    Social History   Socioeconomic History  . Marital status: Divorced    Spouse name: Not on file  . Number of children: Not on file  . Years of education: Not on file  . Highest education level: Not on file  Occupational History  . Not on file  Tobacco Use  . Smoking status: Current Every Day Smoker    Packs/day: 1.00    Years: 40.00    Pack years: 40.00  . Smokeless tobacco: Never Used  . Tobacco comment: pt declines  Substance and Sexual Activity  . Alcohol use: No  . Drug use: No  . Sexual activity: Not on file  Other Topics Concern  . Not on file  Social History Narrative  .  Not on file   Social Determinants of Health   Financial Resource Strain:   . Difficulty of Paying Living Expenses: Not on file  Food Insecurity:   . Worried About Charity fundraiser in the Last Year: Not on file  . Ran Out of Food in the Last Year: Not on file  Transportation Needs:   . Lack of Transportation (Medical): Not on file  . Lack of Transportation (Non-Medical): Not on file  Physical Activity:   . Days of Exercise per Week: Not on file  . Minutes of Exercise per Session: Not on file  Stress:   . Feeling of Stress : Not on file  Social Connections:   . Frequency of Communication with Friends and Family: Not on file  . Frequency of Social Gatherings with Friends and Family: Not on file  . Attends Religious Services: Not on file  . Active Member of Clubs or Organizations: Not on file  . Attends Archivist Meetings: Not on file  . Marital Status: Not on file   Past Surgical History:  Procedure Laterality Date  . CESAREAN SECTION     X 3  . DILATION AND CURETTAGE OF UTERUS    . SHOULDER ARTHROSCOPY WITH OPEN ROTATOR CUFF REPAIR Right 05/2009  .  TONSILLECTOMY  1977  . TUBAL LIGATION     Past Medical History:  Diagnosis Date  . Allergy   . Arthritis   . Basal cell carcinoma 2018   Removed from Right neck per patient    BP 124/86   Pulse 99   Temp 98.6 F (37 C)   Ht 5\' 8"  (1.727 m)   Wt 212 lb 9.6 oz (96.4 kg)   SpO2 98%   BMI 32.33 kg/m   Opioid Risk Score:   Fall Risk Score:  `1  Depression screen PHQ 2/9  Depression screen [facility] 2/9 03/09/2019 12/29/2018 03/07/2018 01/03/2018 06/26/2017 09/05/2016 04/03/2016  Decreased Interest 0 0 0 0 0 0 3  Down, Depressed, Hopeless 0 0 0 0 0 0 0  PHQ - 2 Score 0 0 0 0 0 0 3  Altered sleeping - - - - - - -  Tired, decreased energy - - - - - - -  Change in appetite - - - - - - -  Feeling bad or failure about yourself  - - - - - - -  Trouble concentrating - - - - - - -  Moving slowly or fidgety/restless - - - -  - - -  Suicidal thoughts - - - - - - -  PHQ-9 Score - - - - - - -     Review of Systems  All other systems reviewed and are negative.      Objective:   Physical Exam Vitals and nursing note reviewed.  Constitutional:      Appearance: Normal appearance.  Neck:     Comments: Cervical Paraspinal Tenderness: C-5-C-6 Cardiovascular:     Rate and Rhythm: Normal rate and regular rhythm.     Pulses: Normal pulses.     Heart sounds: Normal heart sounds.  Pulmonary:     Effort: Pulmonary effort is normal.     Breath sounds: Normal breath sounds.  Musculoskeletal:     Cervical back: Normal range of motion and neck supple.     Comments: Normal Muscle Bulk and Muscle Testing Reveals:  Upper Extremities: Right: Decreased ROM 45 Degrees and Muscle Strength 5/5 Right AC Joint Tenderness Left: Full ROM and Muscle Strength 5/5 Lower Extremities: Full ROM and Muscle Strength 5/5 Arises from Table with ease Narrow Based Gait   Skin:    General: Skin is warm and dry.  Neurological:     Mental Status: She is alert and oriented to person, place, and time.  Psychiatric:        Mood and Affect: Mood normal.        Behavior: Behavior normal.           Assessment & Plan:  1.Cervical spinal stenosis without evidence ofradiculopathy. Chronic neck pain: Refilled Tylenol #4 300/60mg  daily, #30.10/19/2019. 2. Right shoulder rotator cuff tear, status post repair with chronic pain and contracture : Continue with exercise regimeas tolerated.10/19/2019. 3. Insomnia:Continue Melatonin.10/19/2019. 4. Chronic Bilateral Lower Back Pain without Sciatica:No complaints Today.Continue HEP as tolerated. Continue to Monitor.10/19/2019 5. Right Greater Trochanter Bursitis: No complaints Today. Continue to Alternate Ice and Heat Therapy. Continue to Monitor.10/19/2019 6. Chronic right Sided Thoracic Back Pain: Continue current medication regime. Continue HEP as tolerated. Continue to  monitor.10/19/2019  15 minutes of face to face patient care time was spent during this visit. All questions were encouraged and answered.   F/U 3 months

## 2019-10-20 ENCOUNTER — Telehealth: Payer: Self-pay

## 2019-10-20 MED ORDER — METHYLPREDNISOLONE 4 MG PO TBPK: ORAL_TABLET | ORAL | 0 refills | Status: DC

## 2019-10-20 NOTE — Telephone Encounter (Signed)
Patient called stating you mentioned prescribing a steroid with her meds. No steroid sent in

## 2019-10-20 NOTE — Telephone Encounter (Signed)
[name] called office stating she has decided she would like to take medrol-dose pak, we discussed this yesterday regarding her right shoulder tendonitis. Medrol dose Pak e-scribed. [name] understanding.

## 2019-10-23 ENCOUNTER — Inpatient Hospital Stay (HOSPITAL_BASED_OUTPATIENT_CLINIC_OR_DEPARTMENT_OTHER): Payer: Medicare Other | Admitting: Hematology & Oncology

## 2019-10-23 ENCOUNTER — Encounter: Payer: Self-pay | Admitting: Hematology & Oncology

## 2019-10-23 ENCOUNTER — Inpatient Hospital Stay: Payer: Medicare Other | Attending: Hematology & Oncology

## 2019-10-23 ENCOUNTER — Other Ambulatory Visit: Payer: Self-pay

## 2019-10-23 VITALS — BP 139/68 | HR 88 | Temp 97.8°F | Resp 18 | Wt 212.0 lb

## 2019-10-23 DIAGNOSIS — D509 Iron deficiency anemia, unspecified: Secondary | ICD-10-CM | POA: Diagnosis not present

## 2019-10-23 DIAGNOSIS — Z79899 Other long term (current) drug therapy: Secondary | ICD-10-CM | POA: Diagnosis not present

## 2019-10-23 DIAGNOSIS — D72829 Elevated white blood cell count, unspecified: Secondary | ICD-10-CM | POA: Insufficient documentation

## 2019-10-23 DIAGNOSIS — F17210 Nicotine dependence, cigarettes, uncomplicated: Secondary | ICD-10-CM | POA: Diagnosis not present

## 2019-10-23 DIAGNOSIS — D72823 Leukemoid reaction: Secondary | ICD-10-CM

## 2019-10-23 LAB — COMPREHENSIVE METABOLIC PANEL
ALT: 26 U/L (ref 0–44)
AST: 18 U/L (ref 15–41)
Albumin: 4.7 g/dL (ref 3.5–5.0)
Alkaline Phosphatase: 50 U/L (ref 38–126)
Anion gap: 7 (ref 5–15)
BUN: 10 mg/dL (ref 6–20)
CO2: 29 mmol/L (ref 22–32)
Calcium: 10.5 mg/dL — ABNORMAL HIGH (ref 8.9–10.3)
Chloride: 106 mmol/L (ref 98–111)
Creatinine, Ser: 0.7 mg/dL (ref 0.44–1.00)
GFR calc Af Amer: >60 mL/min (ref >60)
GFR calc non Af Amer: >60 mL/min (ref >60)
Glucose, Bld: 116 mg/dL — ABNORMAL HIGH (ref 70–99)
Potassium: 4.4 mmol/L (ref 3.5–5.1)
Sodium: 142 mmol/L (ref 135–145)
Total Bilirubin: 0.4 mg/dL (ref 0.3–1.2)
Total Protein: 7.3 g/dL (ref 6.5–8.1)

## 2019-10-23 LAB — CBC WITH DIFFERENTIAL (CANCER CENTER ONLY)
Abs Immature Granulocytes: 0.12 10*3/uL — ABNORMAL HIGH (ref 0.00–0.07)
Basophils Absolute: 0.1 10*3/uL (ref 0.0–0.1)
Basophils Relative: 0 %
Eosinophils Absolute: 0.1 10*3/uL (ref 0.0–0.5)
Eosinophils Relative: 0 %
HCT: 44.1 % (ref 36.0–46.0)
Hemoglobin: 14.3 g/dL (ref 12.0–15.0)
Immature Granulocytes: 1 %
Lymphocytes Relative: 21 %
Lymphs Abs: 4.1 10*3/uL — ABNORMAL HIGH (ref 0.7–4.0)
MCH: 31 pg (ref 26.0–34.0)
MCHC: 32.4 g/dL (ref 30.0–36.0)
MCV: 95.7 fL (ref 80.0–100.0)
Monocytes Absolute: 1.2 10*3/uL — ABNORMAL HIGH (ref 0.1–1.0)
Monocytes Relative: 6 %
Neutro Abs: 13.9 10*3/uL — ABNORMAL HIGH (ref 1.7–7.7)
Neutrophils Relative %: 72 %
Platelet Count: 423 10*3/uL — ABNORMAL HIGH (ref 150–400)
RBC: 4.61 MIL/uL (ref 3.87–5.11)
RDW: 13.7 % (ref 11.5–15.5)
WBC Count: 19.4 10*3/uL — ABNORMAL HIGH (ref 4.0–10.5)
nRBC: 0 % (ref 0.0–0.2)

## 2019-10-23 LAB — SAVE SMEAR(SSMR), FOR PROVIDER SLIDE REVIEW

## 2019-10-23 NOTE — Progress Notes (Signed)
Hematology and Oncology Follow Up Visit  Susan Cortez [id number] [birth year] 60 y.o. 10/23/2019   Principle Diagnosis:   Leukocytosis-reactive  Current Therapy:    Observation     Interim History:  Susan Cortez is back for second office visit.  We first saw her back in January.  At that time, I really did not see anything that looked malignant or a myeloproliferative disorder.  Of note, her white cell count is up today.  She had a cortisone injection yesterday.  She was having some pain in her neck.  She saw her pain specialist.  She has had no problems with lymphadenopathy.  There is no rashes.  She is smoking.  She had stopped smoking but now is smoking because of the coronavirus.  She is headed up to [place].  She will be up there for about 4 weeks taking care of her mother who has Alzheimer's disease.  She has had no problems with bowels or bladder.  She has had no cough.  She has had no bleeding.  Overall, her performance status is ECOG 0.  Medications:  Current Outpatient Medications:  .  acetaminophen-codeine (TYLENOL #4) 300-60 MG tablet, Take 1 tablet by mouth daily., Disp: 30 tablet, Rfl: 2 .  Calcium-Magnesium-Vitamin D (CALCIUM 500 PO), Take by mouth., Disp: , Rfl:  .  cholecalciferol (VITAMIN D) 1000 units tablet, Take 1,000 Units by mouth daily., Disp: , Rfl:  .  lovastatin (MEVACOR) 20 MG tablet, TAKE 1 TABLET(20 MG) BY MOUTH AT BEDTIME, Disp: 90 tablet, Rfl: 3 .  methylPREDNISolone (MEDROL DOSEPAK) 4 MG TBPK tablet, Use as Directed, Disp: 21 tablet, Rfl: 0 .  mirabegron ER (MYRBETRIQ) 50 MG TB24 tablet, Take 1 tablet (50 mg total) by mouth daily., Disp: 90 tablet, Rfl: 3 .  polyethylene glycol powder (GLYCOLAX/MIRALAX) powder, Take 17 g 2 (two) times daily as needed by mouth. Use for constipation, Disp: 3350 g, Rfl: 6 .  tiotropium (SPIRIVA HANDIHALER) 18 MCG inhalation capsule, Place 1 capsule (18 mcg total) into inhaler and inhale daily., Disp:  30 capsule, Rfl: 12 .  triamcinolone cream (KENALOG) 0.1 %, APPLY TO AFFECTED AREA TWICE A DAY, Disp: , Rfl:   Allergies:  Allergies  Allergen Reactions  . Chantix [Varenicline] Other (See Comments)    Strange dreams/hallucinations  . Diclofenac Swelling  . Meloxicam Swelling  . Butrans [Buprenorphine] Itching    Itching and redness at the area of the patch.  Recardo Evangelist [Pregabalin]     Headache,severe eye pain, swelling  . Nsaids     Stomach pain  . Tolmetin Other (See Comments)    Stomach pain    Past Medical History, Surgical history, Social history, and Family History were reviewed and updated.  Review of Systems: Review of Systems  Constitutional: Negative.   HENT:  Negative.   Eyes: Negative.   Respiratory: Negative.   Cardiovascular: Negative.   Gastrointestinal: Negative.   Endocrine: Negative.   Genitourinary: Negative.    Musculoskeletal: Negative.   Skin: Negative.   Neurological: Negative.   Hematological: Negative.   Psychiatric/Behavioral: Negative.     Physical Exam:  weight is 212 lb (96.2 kg). Her temporal temperature is 97.8 F (36.6 C). Her blood pressure is 139/68 and her pulse is 88. Her respiration is 18 and oxygen saturation is 99%.   Wt Readings from Last 3 Encounters:  10/23/19 212 lb (96.2 kg)  10/19/19 212 lb 9.6 oz (96.4 kg)  08/31/19 212 lb (96.2 kg)  Physical Exam Vitals reviewed.  HENT:     Head: Normocephalic and atraumatic.  Eyes:     Pupils: Pupils are equal, round, and reactive to light.  Cardiovascular:     Rate and Rhythm: Normal rate and regular rhythm.     Heart sounds: Normal heart sounds.  Pulmonary:     Effort: Pulmonary effort is normal.     Breath sounds: Normal breath sounds.  Abdominal:     General: Bowel sounds are normal.     Palpations: Abdomen is soft.  Musculoskeletal:        General: No tenderness or deformity. Normal range of motion.     Cervical back: Normal range of motion.  Lymphadenopathy:      Cervical: No cervical adenopathy.  Skin:    General: Skin is warm and dry.     Findings: No erythema or rash.  Neurological:     Mental Status: She is alert and oriented to person, place, and time.  Psychiatric:        Behavior: Behavior normal.        Thought Content: Thought content normal.        Judgment: Judgment normal.      Lab Results  Component Value Date   WBC 19.4 (H) 10/23/2019   HGB 14.3 10/23/2019   HCT 44.1 10/23/2019   MCV 95.7 10/23/2019   PLT 423 (H) 10/23/2019     Chemistry      Component Value Date/Time   NA 142 10/23/2019 1027   K 4.4 10/23/2019 1027   CL 106 10/23/2019 1027   CO2 29 10/23/2019 1027   BUN 10 10/23/2019 1027   CREATININE 0.70 10/23/2019 1027   CREATININE 0.83 08/31/2019 1429      Component Value Date/Time   CALCIUM 10.5 (H) 10/23/2019 1027   ALKPHOS 50 10/23/2019 1027   AST 18 10/23/2019 1027   AST 18 08/31/2019 1429   ALT 26 10/23/2019 1027   ALT 21 08/31/2019 1429   BILITOT 0.4 10/23/2019 1027   BILITOT 0.4 08/31/2019 1429       Impression and Plan: Susan Cortez is a 59 year old white female.  She has mild leukocytosis.  The leukocytosis is a little more prominent right now because of the cortisone injection.  She also may have some leukocytosis from her tobacco use.  I looked at her blood under the microscope.  Her white cells appear mature.  I did not see nothing that looks like a myeloproliferative disorder or chronic lymphocytic leukemia.  I would like to see her back before she sees her pain specialist.  I would plan on getting her back in April.  She will be back from [place] around Easter.  I do not see need for any kind of bone marrow test on her.   Volanda Napoleon, MD 2/26/202111:17 AM

## 2019-10-26 LAB — IRON AND TIBC
Iron: 109 ug/dL (ref 41–142)
Saturation Ratios: 28 % (ref 21–57)
TIBC: 393 ug/dL (ref 236–444)
UIBC: 284 ug/dL (ref 120–384)

## 2019-10-26 LAB — FERRITIN: Ferritin: 162 ng/mL (ref 11–307)

## 2019-10-26 LAB — LACTATE DEHYDROGENASE: LDH: 276 U/L — ABNORMAL HIGH (ref 98–192)

## 2019-10-30 ENCOUNTER — Other Ambulatory Visit: Payer: Medicare Other

## 2019-10-30 ENCOUNTER — Ambulatory Visit: Payer: Medicare Other | Admitting: Hematology & Oncology

## 2019-11-17 ENCOUNTER — Other Ambulatory Visit: Payer: Self-pay | Admitting: Hematology & Oncology

## 2019-11-17 LAB — JAK2 (INCLUDING V617F AND EXON 12), MPL,& CALR-NEXT GEN SEQ

## 2019-11-24 ENCOUNTER — Ambulatory Visit: Payer: Medicare Other | Admitting: Podiatry

## 2019-12-01 ENCOUNTER — Ambulatory Visit: Payer: Medicare Other | Admitting: Podiatry

## 2019-12-02 ENCOUNTER — Ambulatory Visit (INDEPENDENT_AMBULATORY_CARE_PROVIDER_SITE_OTHER): Payer: Medicare Other | Admitting: Podiatry

## 2019-12-02 ENCOUNTER — Encounter: Payer: Self-pay | Admitting: Podiatry

## 2019-12-02 ENCOUNTER — Other Ambulatory Visit: Payer: Self-pay

## 2019-12-02 DIAGNOSIS — M216X1 Other acquired deformities of right foot: Secondary | ICD-10-CM

## 2019-12-02 DIAGNOSIS — Q828 Other specified congenital malformations of skin: Secondary | ICD-10-CM

## 2019-12-02 DIAGNOSIS — M216X2 Other acquired deformities of left foot: Secondary | ICD-10-CM | POA: Insufficient documentation

## 2019-12-02 NOTE — Progress Notes (Signed)
This patient present to the office  with chief complaint of callus developing under the outside of ball of both feet.  She says this callus has become painful walking and wearing her shoes. Patient states the callus has been painful for years and used to have them treated with pedicures. Patient has provided no  treatment or sought professional help.  She presents to the office for treatment of her painful callus.  Vascular  Dorsalis pedis and posterior tibial pulses are palpable  B/L.  Capillary return  WNL.  Temperature gradient is  WNL.  Skin turgor  WNL  Sensorium  Senn Weinstein monofilament wire  WNL. Normal tactile sensation.  Nail Exam  Patient has normal nails with no evidence of bacterial or fungal infection.  Orthopedic  Exam  Muscle tone and muscle strength  WNL.  No limitations of motion feet  B/L.  No crepitus or joint effusion noted.  Foot type is unremarkable and digits show no abnormalities.  Bony prominences are unremarkable.  Plantar flexed fifth metatarsal  B/L.  Skin  No open lesions.  Normal skin texture and turgor.  Callus/porokeratosis  sub 5th  B/L  Porokeratosis secondary plantar flexed fifth metatarsal  B/L  IE  Debride callus/porokeratosis.  Discussed condition with patient.  RTC prn  Gardiner Barefoot DPM

## 2019-12-14 ENCOUNTER — Other Ambulatory Visit (HOSPITAL_BASED_OUTPATIENT_CLINIC_OR_DEPARTMENT_OTHER): Payer: Self-pay | Admitting: Family Medicine

## 2019-12-14 ENCOUNTER — Encounter: Payer: Self-pay | Admitting: Hematology & Oncology

## 2019-12-14 ENCOUNTER — Inpatient Hospital Stay (HOSPITAL_BASED_OUTPATIENT_CLINIC_OR_DEPARTMENT_OTHER): Payer: Medicare Other | Admitting: Hematology & Oncology

## 2019-12-14 ENCOUNTER — Other Ambulatory Visit: Payer: Self-pay

## 2019-12-14 ENCOUNTER — Telehealth: Payer: Self-pay | Admitting: Hematology & Oncology

## 2019-12-14 ENCOUNTER — Inpatient Hospital Stay: Payer: Medicare Other | Attending: Hematology & Oncology

## 2019-12-14 VITALS — BP 137/78 | HR 81 | Temp 97.8°F | Resp 18 | Wt 210.0 lb

## 2019-12-14 DIAGNOSIS — D72829 Elevated white blood cell count, unspecified: Secondary | ICD-10-CM | POA: Insufficient documentation

## 2019-12-14 DIAGNOSIS — D72823 Leukemoid reaction: Secondary | ICD-10-CM | POA: Diagnosis not present

## 2019-12-14 DIAGNOSIS — Z79899 Other long term (current) drug therapy: Secondary | ICD-10-CM | POA: Insufficient documentation

## 2019-12-14 DIAGNOSIS — Z1231 Encounter for screening mammogram for malignant neoplasm of breast: Secondary | ICD-10-CM

## 2019-12-14 LAB — CBC WITH DIFFERENTIAL (CANCER CENTER ONLY)
Abs Immature Granulocytes: 0.03 10*3/uL (ref 0.00–0.07)
Basophils Absolute: 0.1 10*3/uL (ref 0.0–0.1)
Basophils Relative: 1 %
Eosinophils Absolute: 0.2 10*3/uL (ref 0.0–0.5)
Eosinophils Relative: 2 %
HCT: 46 % (ref 36.0–46.0)
Hemoglobin: 15.3 g/dL — ABNORMAL HIGH (ref 12.0–15.0)
Immature Granulocytes: 0 %
Lymphocytes Relative: 38 %
Lymphs Abs: 4.3 10*3/uL — ABNORMAL HIGH (ref 0.7–4.0)
MCH: 31.6 pg (ref 26.0–34.0)
MCHC: 33.3 g/dL (ref 30.0–36.0)
MCV: 95 fL (ref 80.0–100.0)
Monocytes Absolute: 0.7 10*3/uL (ref 0.1–1.0)
Monocytes Relative: 7 %
Neutro Abs: 5.9 10*3/uL (ref 1.7–7.7)
Neutrophils Relative %: 52 %
Platelet Count: 396 10*3/uL (ref 150–400)
RBC: 4.84 MIL/uL (ref 3.87–5.11)
RDW: 13.2 % (ref 11.5–15.5)
WBC Count: 11.2 10*3/uL — ABNORMAL HIGH (ref 4.0–10.5)
nRBC: 0 % (ref 0.0–0.2)

## 2019-12-14 LAB — CMP (CANCER CENTER ONLY)
ALT: 21 U/L (ref 0–44)
AST: 19 U/L (ref 15–41)
Albumin: 4.6 g/dL (ref 3.5–5.0)
Alkaline Phosphatase: 51 U/L (ref 38–126)
Anion gap: 8 (ref 5–15)
BUN: 7 mg/dL (ref 6–20)
CO2: 29 mmol/L (ref 22–32)
Calcium: 9.9 mg/dL (ref 8.9–10.3)
Chloride: 103 mmol/L (ref 98–111)
Creatinine: 0.74 mg/dL (ref 0.44–1.00)
GFR, Est AFR Am: >60 mL/min (ref >60)
GFR, Estimated: >60 mL/min (ref >60)
Glucose, Bld: 116 mg/dL — ABNORMAL HIGH (ref 70–99)
Potassium: 4.1 mmol/L (ref 3.5–5.1)
Sodium: 140 mmol/L (ref 135–145)
Total Bilirubin: 0.5 mg/dL (ref 0.3–1.2)
Total Protein: 7.3 g/dL (ref 6.5–8.1)

## 2019-12-14 LAB — SAVE SMEAR(SSMR), FOR PROVIDER SLIDE REVIEW

## 2019-12-14 LAB — LACTATE DEHYDROGENASE: LDH: 183 U/L (ref 98–192)

## 2019-12-14 NOTE — Progress Notes (Signed)
Hematology and Oncology Follow Up Visit  Susan Cortez [id number] 20-May-1961 59 y.o. 12/14/2019   Principle Diagnosis:   Leukocytosis-reactive  Current Therapy:    Observation     Interim History:  Susan Cortez is back for follow-up.  We last saw her back in February.  She had a good March.  She was up in [place] the whole month.  She is helping with her mom's house.  She still is feeling very tired.  Not sure why she is so tired.  I think that she may have sleep apnea.  She said that she had a sleep study a year ago.  I am unsure what the results were.  She has had no problem with fever.  She has had no rashes.  She did have some poison ivy while up in [place].  She has had no swollen lymph nodes.  She has had no nausea or vomiting.  There has been no bleeding.  Is been no change in bowel or bladder habits.  She is still smoking.  Overall, her performance status is ECOG 0.  Medications:  Current Outpatient Medications:  .  acetaminophen-codeine (TYLENOL #4) 300-60 MG tablet, Take 1 tablet by mouth daily., Disp: 30 tablet, Rfl: 2 .  Calcium-Magnesium-Vitamin D (CALCIUM 500 PO), Take by mouth., Disp: , Rfl:  .  cholecalciferol (VITAMIN D) 1000 units tablet, Take 1,000 Units by mouth daily., Disp: , Rfl:  .  lovastatin (MEVACOR) 20 MG tablet, TAKE 1 TABLET(20 MG) BY MOUTH AT BEDTIME, Disp: 90 tablet, Rfl: 3 .  mirabegron ER (MYRBETRIQ) 50 MG TB24 tablet, Take 1 tablet (50 mg total) by mouth daily., Disp: 90 tablet, Rfl: 3 .  polyethylene glycol powder (GLYCOLAX/MIRALAX) powder, Take 17 g 2 (two) times daily as needed by mouth. Use for constipation, Disp: 3350 g, Rfl: 6 .  tiotropium (SPIRIVA HANDIHALER) 18 MCG inhalation capsule, Place 1 capsule (18 mcg total) into inhaler and inhale daily., Disp: 30 capsule, Rfl: 12 .  triamcinolone cream (KENALOG) 0.1 %, APPLY TO AFFECTED AREA TWICE A DAY, Disp: , Rfl:   Allergies:  Allergies  Allergen Reactions  . Chantix [Varenicline]  Other (See Comments)    Strange dreams/hallucinations  . Diclofenac Swelling  . Meloxicam Swelling  . Butrans [Buprenorphine] Itching    Itching and redness at the area of the patch.  Recardo Evangelist [Pregabalin]     Headache,severe eye pain, swelling  . Nsaids     Stomach pain  . Tolmetin Other (See Comments)    Stomach pain    Past Medical History, Surgical history, Social history, and Family History were reviewed and updated.  Review of Systems: Review of Systems  Constitutional: Negative.   HENT:  Negative.   Eyes: Negative.   Respiratory: Negative.   Cardiovascular: Negative.   Gastrointestinal: Negative.   Endocrine: Negative.   Genitourinary: Negative.    Musculoskeletal: Negative.   Skin: Negative.   Neurological: Negative.   Hematological: Negative.   Psychiatric/Behavioral: Negative.     Physical Exam:  vitals were not taken for this visit.   Wt Readings from Last 3 Encounters:  10/23/19 212 lb (96.2 kg)  10/19/19 212 lb 9.6 oz (96.4 kg)  08/31/19 212 lb (96.2 kg)    Physical Exam Vitals reviewed.  HENT:     Head: Normocephalic and atraumatic.  Eyes:     Pupils: Pupils are equal, round, and reactive to light.  Cardiovascular:     Rate and Rhythm: Normal rate and regular  rhythm.     Heart sounds: Normal heart sounds.  Pulmonary:     Effort: Pulmonary effort is normal.     Breath sounds: Normal breath sounds.  Abdominal:     General: Bowel sounds are normal.     Palpations: Abdomen is soft.  Musculoskeletal:        General: No tenderness or deformity. Normal range of motion.     Cervical back: Normal range of motion.  Lymphadenopathy:     Cervical: No cervical adenopathy.  Skin:    General: Skin is warm and dry.     Findings: No erythema or rash.  Neurological:     Mental Status: She is alert and oriented to person, place, and time.  Psychiatric:        Behavior: Behavior normal.        Thought Content: Thought content normal.        Judgment:  Judgment normal.      Lab Results  Component Value Date   WBC 11.2 (H) 12/14/2019   HGB 15.3 (H) 12/14/2019   HCT 46.0 12/14/2019   MCV 95.0 12/14/2019   PLT 396 12/14/2019     Chemistry      Component Value Date/Time   NA 142 10/23/2019 1027   K 4.4 10/23/2019 1027   CL 106 10/23/2019 1027   CO2 29 10/23/2019 1027   BUN 10 10/23/2019 1027   CREATININE 0.70 10/23/2019 1027   CREATININE 0.83 08/31/2019 1429      Component Value Date/Time   CALCIUM 10.5 (H) 10/23/2019 1027   ALKPHOS 50 10/23/2019 1027   AST 18 10/23/2019 1027   AST 18 08/31/2019 1429   ALT 26 10/23/2019 1027   ALT 21 08/31/2019 1429   BILITOT 0.4 10/23/2019 1027   BILITOT 0.4 08/31/2019 1429       Impression and Plan: Susan Cortez is a 58 year old white female.  She has mild leukocytosis.  This is the best her white cell count has been.  I still believe that everything is reactive.    She also may have some leukocytosis from her tobacco use.  I looked at her blood under the microscope.  Her white cells appear mature.  I did not see nothing that looks like a myeloproliferative disorder or chronic lymphocytic leukemia.  I think that we can get her back in 6 months now.  I do not see need for any kind of bone marrow test on her.   Volanda Napoleon, MD 4/19/20218:20 AM

## 2019-12-14 NOTE — Telephone Encounter (Signed)
Appointments scheduled calendar printed per 4/19 los

## 2019-12-17 DIAGNOSIS — Z03818 Encounter for observation for suspected exposure to other biological agents ruled out: Secondary | ICD-10-CM | POA: Diagnosis not present

## 2020-01-04 DIAGNOSIS — Z122 Encounter for screening for malignant neoplasm of respiratory organs: Secondary | ICD-10-CM | POA: Insufficient documentation

## 2020-01-04 NOTE — Progress Notes (Addendum)
[facility] [address], [place], [place] [address] [phone]  Date:  01/06/2020   Name:  Susan Cortez   DOB:  09-Mar-1961   MRN:  [id number]  PCP:  Darreld Mclean, MD    Chief Complaint: Neck Pain (family histry of thyroid)   History of Present Illness:  Susan Cortez is a 59 y.o. very pleasant female patient who presents with the following:  Patient here today for a follow-up visit and to discuss a couple of concerns History of cervical spinal stenosis, lower back pain, intermittent claudication, dyslipidemia, prediabetes Last seen by myself in October for physical exam At that time she had improvement of her urinary incontinence with Myrbetriq  She is a patient physical medicine and rehab for chronic neck and back pain- Dr Letta Pate   History of current smoking-lung cancer screening CT up-to-date  COVID-19 vaccine- done, she had a pretty severe reaction and was in bed for a couple of days after both doses.  She hopes she will not to do this again! Pap- done in 2017, she would like to do later which is ok  Shingrix is complete  She has been seeing hematology for leukocytosis, at this point this is thought to be a benign issue and she is being followed regularly-no bone marrow done at this time Mammo is now due- scheduled for next week   Her daughter is getting married in [place] next weekend  Her mother was diagnosed with dementia earlier this year She has noted a lump in her right neck which appeared in January- she notes some tenderness.  Not getting bigger, but has not gone away.  She had this checked by Dr. Ella Bodo and Dr. Marin Olp, they thought perhaps is related to her thyroid  She also has noted some tenderness in the pinna of her right ear just the last couple of days, she feels like she is getting a pimple  Tends to have a chronic productive cough-current smoker  Patient Active Problem List   Diagnosis Date Noted  . Encounter for screening for lung cancer 01/04/2020  . Porokeratosis 12/02/2019  . Plantar flexed metatarsal bone of left foot 12/02/2019  . Plantar flexed metatarsal bone of right foot 12/02/2019  . Prediabetes 05/31/2019  . Dyslipidemia 03/01/2018  . De Quervain's disease (tenosynovitis) 03/28/2017  . Chronic bilateral low back pain without sciatica 03/28/2017  . Sebaceous hyperplasia of face 06/26/2016  . Chronic right shoulder pain 01/02/2016  . Adenoma of left adrenal gland 12/26/2015  . Intermittent claudication ([facility]) 12/26/2015  . Rotator cuff (capsule) sprain 03/26/2014  . Spinal stenosis in cervical region 03/26/2014  . Chronic pain 03/26/2014    Past Medical History:  Diagnosis Date  . Allergy   . Arthritis   . Basal cell carcinoma 2018   Removed from Right neck per patient     Past Surgical History:  Procedure Laterality Date  . CESAREAN SECTION     X 3  . DILATION AND CURETTAGE OF UTERUS    . SHOULDER ARTHROSCOPY WITH OPEN ROTATOR CUFF REPAIR Right 05/2009  . TONSILLECTOMY  1977  . TUBAL LIGATION      Social History   Tobacco Use  . Smoking status: Current Every Day Smoker    Packs/day: 1.00    Years: 40.00    Pack years: 40.00  . Smokeless tobacco: Never Used  . Tobacco comment: pt declines  Substance Use Topics  .  Alcohol use: No  . Drug use: No    Family History  Problem Relation Age of Onset  . Diabetes Father   . Heart disease Father   . Hyperlipidemia Father     Allergies  Allergen Reactions  . Chantix [Varenicline] Other (See Comments)    Strange dreams/hallucinations  . Diclofenac Swelling  . Meloxicam Swelling  . Butrans [Buprenorphine] Itching    Itching and redness at the area of the patch.  Recardo Evangelist [Pregabalin]     Headache,severe eye pain, swelling  . Nsaids     Stomach pain  . Tolmetin Other (See Comments)    Stomach pain    Medication list has been reviewed and updated.  Current Outpatient  Medications on File Prior to Visit  Medication Sig Dispense Refill  . acetaminophen-codeine (TYLENOL #4) 300-60 MG tablet Take 1 tablet by mouth daily. 30 tablet 2  . Calcium-Magnesium-Vitamin D (CALCIUM 500 PO) Take by mouth.    . cholecalciferol (VITAMIN D) 1000 units tablet Take 1,000 Units by mouth daily.    Marland Kitchen lovastatin (MEVACOR) 20 MG tablet TAKE 1 TABLET(20 MG) BY MOUTH AT BEDTIME 90 tablet 3  . mirabegron ER (MYRBETRIQ) 50 MG TB24 tablet Take 1 tablet (50 mg total) by mouth daily. 90 tablet 3  . polyethylene glycol powder (GLYCOLAX/MIRALAX) powder Take 17 g 2 (two) times daily as needed by mouth. Use for constipation 3350 g 6  . tiotropium (SPIRIVA HANDIHALER) 18 MCG inhalation capsule Place 1 capsule (18 mcg total) into inhaler and inhale daily. 30 capsule 12  . triamcinolone cream (KENALOG) 0.1 % APPLY TO AFFECTED AREA TWICE A DAY     No current facility-administered medications on file prior to visit.    Review of Systems:  As per HPI- otherwise negative.   Physical Examination: Vitals:   01/06/20 0933  BP: 132/85  Pulse: 97  Resp: 16  Temp: 98.4 F (36.9 C)  SpO2: 99%   Vitals:   01/06/20 0933  Weight: 215 lb (97.5 kg)  Height: 5\' 8"  (1.727 m)   Body mass index is 32.69 kg/m. Ideal Body Weight: Weight in (lb) to have BMI = 25: 164.1  GEN: no acute distress.  Obese, otherwise looks well HEENT: Atraumatic, Normocephalic.   Bilateral TM wnl, oropharynx normal.  PEERL,EOMI.   The right ear pinna displays a small area of tenderness and minimal swelling, which may represent a pimple Palpation of her neck is more or less benign.  There is a possible small node overlying the right sternocleidomastoid muscle.  Thyroid seems normal Ears and Nose: No external deformity. CV: RRR, No M/G/R. No JVD. No thrill. No extra heart sounds. PULM: CTA B, no wheezes, crackles, rhonchi. No retractions. No resp. distress. No accessory muscle use. EXTR: No c/c/e PSYCH: Normally  interactive. Conversant.    Assessment and Plan: Thyroid nodule - Plan: US THYROID, TSH  Encounter for screening for lung cancer - Plan: US Soft Tissue Head/Neck (NON-THYROID)  LAD (lymphadenopathy) of right cervical region  Acute bronchitis, unspecified organism - Plan: doxycycline (VIBRAMYCIN) 100 MG capsule  Here today for a follow-up visit and discuss a couple of concerns She may be getting a pimple on her right ear, and has noticed some exacerbation of her chronic bronchitis.  Will call in a course of doxycycline as she is attending her daughter's wedding about 10 days Ordered ultrasound of the right neck and also thyroid to evaluate possible nodule in the right neck.  Will check TSH Further follow-up  pending results of her ultrasound and labs  Moderate medical decision making today  This visit occurred during the SARS-CoV-2 public health emergency.  Safety protocols were in place, including screening questions prior to the visit, additional usage of staff PPE, and extensive cleaning of exam room while observing appropriate contact time as indicated for disinfecting solutions.    Signed Lamar Blinks, MD  Received her TSH- normal.  Message to pt   Results for orders placed or performed in visit on 01/06/20  TSH  Result Value Ref Range   TSH 2.35 0.35 - 4.50 uIU/mL

## 2020-01-06 ENCOUNTER — Other Ambulatory Visit: Payer: Self-pay

## 2020-01-06 ENCOUNTER — Encounter: Payer: Self-pay | Admitting: Family Medicine

## 2020-01-06 ENCOUNTER — Ambulatory Visit (INDEPENDENT_AMBULATORY_CARE_PROVIDER_SITE_OTHER): Payer: Medicare Other | Admitting: Family Medicine

## 2020-01-06 VITALS — BP 132/85 | HR 97 | Temp 98.4°F | Resp 16 | Ht 68.0 in | Wt 215.0 lb

## 2020-01-06 DIAGNOSIS — R590 Localized enlarged lymph nodes: Secondary | ICD-10-CM

## 2020-01-06 DIAGNOSIS — Z122 Encounter for screening for malignant neoplasm of respiratory organs: Secondary | ICD-10-CM | POA: Diagnosis not present

## 2020-01-06 DIAGNOSIS — E041 Nontoxic single thyroid nodule: Secondary | ICD-10-CM | POA: Diagnosis not present

## 2020-01-06 DIAGNOSIS — J209 Acute bronchitis, unspecified: Secondary | ICD-10-CM

## 2020-01-06 LAB — TSH: TSH: 2.35 u[IU]/mL (ref 0.35–4.50)

## 2020-01-06 MED ORDER — DOXYCYCLINE HYCLATE 100 MG PO CAPS: 100.0000 mg | ORAL_CAPSULE | Freq: Two times a day (BID) | ORAL | 0 refills | Status: DC

## 2020-01-06 NOTE — Patient Instructions (Signed)
Great to see you today-   Please stop by the lab for thyroid level checked, then go to imaging on the ground floor.  They may be also do your ultrasound studies today.  If not, please schedule for a convenient time, not an emergency  I called in a course of doxycycline for your lungs and possible infection of the right ear  I hope everything is perfect for your daughter's wedding, enjoy!

## 2020-01-11 ENCOUNTER — Encounter: Payer: Medicare Other | Attending: Registered Nurse | Admitting: Registered Nurse

## 2020-01-11 ENCOUNTER — Other Ambulatory Visit: Payer: Self-pay

## 2020-01-11 VITALS — BP 152/75 | HR 95 | Temp 98.5°F | Ht 68.0 in | Wt 215.0 lb

## 2020-01-11 DIAGNOSIS — M75101 Unspecified rotator cuff tear or rupture of right shoulder, not specified as traumatic: Secondary | ICD-10-CM | POA: Diagnosis not present

## 2020-01-11 DIAGNOSIS — M545 Low back pain: Secondary | ICD-10-CM | POA: Insufficient documentation

## 2020-01-11 DIAGNOSIS — M542 Cervicalgia: Secondary | ICD-10-CM

## 2020-01-11 DIAGNOSIS — G894 Chronic pain syndrome: Secondary | ICD-10-CM | POA: Insufficient documentation

## 2020-01-11 DIAGNOSIS — G8929 Other chronic pain: Secondary | ICD-10-CM

## 2020-01-11 DIAGNOSIS — M1990 Unspecified osteoarthritis, unspecified site: Secondary | ICD-10-CM | POA: Diagnosis not present

## 2020-01-11 DIAGNOSIS — Z85828 Personal history of other malignant neoplasm of skin: Secondary | ICD-10-CM | POA: Insufficient documentation

## 2020-01-11 DIAGNOSIS — Z760 Encounter for issue of repeat prescription: Secondary | ICD-10-CM | POA: Insufficient documentation

## 2020-01-11 DIAGNOSIS — M4802 Spinal stenosis, cervical region: Secondary | ICD-10-CM | POA: Insufficient documentation

## 2020-01-11 DIAGNOSIS — F17210 Nicotine dependence, cigarettes, uncomplicated: Secondary | ICD-10-CM | POA: Diagnosis not present

## 2020-01-11 DIAGNOSIS — G4700 Insomnia, unspecified: Secondary | ICD-10-CM | POA: Insufficient documentation

## 2020-01-11 DIAGNOSIS — M5412 Radiculopathy, cervical region: Secondary | ICD-10-CM

## 2020-01-11 DIAGNOSIS — Z79891 Long term (current) use of opiate analgesic: Secondary | ICD-10-CM | POA: Diagnosis not present

## 2020-01-11 DIAGNOSIS — Z5181 Encounter for therapeutic drug level monitoring: Secondary | ICD-10-CM

## 2020-01-11 DIAGNOSIS — M25511 Pain in right shoulder: Secondary | ICD-10-CM

## 2020-01-11 MED ORDER — ACETAMINOPHEN-CODEINE #4 300-60 MG PO TABS: 1.0000 | ORAL_TABLET | Freq: Every day | ORAL | 2 refills | Status: DC

## 2020-01-11 NOTE — Progress Notes (Signed)
Subjective:    Patient ID: Susan Cortez, female    DOB: [birth year], 60 y.o.   MRN: [id number]  HPI: Susan Cortez is a 59 y.o. female who returns for follow up appointment for chronic pain and medication refill. She states her pain is located in her neck radiating into her right shoulder. She rates her pain 6. Her current exercise regime is walking and performing stretching exercises.  Ms. [name] Morphine equivalent is 9.00 MME.  UDS Performed Today.   Pain Inventory Average Pain 6 Pain Right Now 7 My pain is constant, sharp, burning, stabbing and aching  In the last 24 hours, has pain interfered with the following? General activity 1 Relation with others 5 Enjoyment of life 5 What TIME of day is your pain at its worst? daytime Sleep (in general) Poor  Pain is worse with: bending and some activites Pain improves with: medication Relief from Meds: 6  Mobility walk without assistance how many minutes can you walk? 30 ability to climb steps?  yes do you drive?  yes  Function disabled: date disabled 10/21/2008  Neuro/Psych No problems in this area  Prior Studies Any changes since last visit?  no  Physicians involved in your care Any changes since last visit?  no   Family History  Problem Relation Age of Onset  . Diabetes Father   . Heart disease Father   . Hyperlipidemia Father    Social History   Socioeconomic History  . Marital status: Divorced    Spouse name: Not on file  . Number of children: Not on file  . Years of education: Not on file  . Highest education level: Not on file  Occupational History  . Not on file  Tobacco Use  . Smoking status: Current Every Day Smoker    Packs/day: 1.00    Years: 40.00    Pack years: 40.00  . Smokeless tobacco: Never Used  . Tobacco comment: pt declines  Substance and Sexual Activity  . Alcohol use: No  . Drug use: No  . Sexual activity: Not on file  Other Topics Concern  . Not on file  Social History  Narrative  . Not on file   Social Determinants of Health   Financial Resource Strain:   . Difficulty of Paying Living Expenses:   Food Insecurity:   . Worried About Charity fundraiser in the Last Year:   . Arboriculturist in the Last Year:   Transportation Needs:   . Film/video editor (Medical):   Marland Kitchen Lack of Transportation (Non-Medical):   Physical Activity:   . Days of Exercise per Week:   . Minutes of Exercise per Session:   Stress:   . Feeling of Stress :   Social Connections:   . Frequency of Communication with Friends and Family:   . Frequency of Social Gatherings with Friends and Family:   . Attends Religious Services:   . Active Member of Clubs or Organizations:   . Attends Archivist Meetings:   Marland Kitchen Marital Status:    Past Surgical History:  Procedure Laterality Date  . CESAREAN SECTION     X 3  . DILATION AND CURETTAGE OF UTERUS    . SHOULDER ARTHROSCOPY WITH OPEN ROTATOR CUFF REPAIR Right 05/2009  . TONSILLECTOMY  1977  . TUBAL LIGATION     Past Medical History:  Diagnosis Date  . Allergy   . Arthritis   . Basal cell carcinoma 2018  Removed from Right neck per patient    BP (!) 152/75   Pulse 95   Temp 98.5 F (36.9 C)   Ht 5\' 8"  (1.727 m)   Wt 215 lb (97.5 kg)   SpO2 96%   BMI 32.69 kg/m   Opioid Risk Score:   Fall Risk Score:  `1  Depression screen PHQ 2/9  Depression screen [facility] 2/9 03/09/2019 12/29/2018 03/07/2018 01/03/2018 06/26/2017 09/05/2016 04/03/2016  Decreased Interest 0 0 0 0 0 0 3  Down, Depressed, Hopeless 0 0 0 0 0 0 0  PHQ - 2 Score 0 0 0 0 0 0 3  Altered sleeping - - - - - - -  Tired, decreased energy - - - - - - -  Change in appetite - - - - - - -  Feeling bad or failure about yourself  - - - - - - -  Trouble concentrating - - - - - - -  Moving slowly or fidgety/restless - - - - - - -  Suicidal thoughts - - - - - - -  PHQ-9 Score - - - - - - -    Review of Systems  Respiratory: Positive for shortness of breath.    Gastrointestinal: Positive for constipation.  All other systems reviewed and are negative.      Objective:   Physical Exam Vitals and nursing note reviewed.  Constitutional:      Appearance: Normal appearance.  Neck:     Comments: Cervical Paraspinal Tenderness: C-5-C-6 Cardiovascular:     Rate and Rhythm: Normal rate and regular rhythm.     Pulses: Normal pulses.     Heart sounds: Normal heart sounds.  Pulmonary:     Effort: Pulmonary effort is normal.     Breath sounds: Normal breath sounds.  Musculoskeletal:     Cervical back: Normal range of motion and neck supple.     Comments: Normal Muscle Bulk and Muscle Testing Reveals:  Upper Extremities: Right: Decreased ROM 90 Degrees and Muscle Strength 5/5 Right AC Joint Tenderness Left: Full ROM and Muscle Strength  5/5 Lumbar Paraspinal Tenderness: L-4-L-5 Lower Extremities: Full ROM and Muscle Strength 5/5 Arises from chair with ease Narrow Based  Gait   Skin:    General: Skin is warm and dry.  Neurological:     Mental Status: She is alert and oriented to person, place, and time.  Psychiatric:        Mood and Affect: Mood normal.        Behavior: Behavior normal.           Assessment & Plan:  1.Cervical spinal stenosis without evidence ofradiculopathy. Chronic neck pain: Refilled Tylenol #4 300/60mg  daily, #30.01/11/2020. 2. Right shoulder rotator cuff tear, status post repair with chronic pain and contracture : Continue with exercise regimeas tolerated.01/11/2020. 3. Insomnia:Continue Melatonin.01/11/2020. 4. Chronic Bilateral Lower Back Pain without Sciatica:No complaints Today.Continue HEP as tolerated. Continue to Monitor.01/11/2020 5. Right Greater Trochanter Bursitis:No complaints Today.Continue to Alternate Ice and Heat Therapy. Continue to Monitor.01/11/2020 6. Chronic right Sided Thoracic Back Pain: Continue current medication regime. Continue HEP as tolerated. Continue to  monitor.01/11/2020  15 minutes of face to face patient care time was spent during this visit. All questions were encouraged and answered.   F/U 3 months

## 2020-01-12 ENCOUNTER — Ambulatory Visit (HOSPITAL_BASED_OUTPATIENT_CLINIC_OR_DEPARTMENT_OTHER)
Admission: RE | Admit: 2020-01-12 | Discharge: 2020-01-12 | Disposition: A | Discharge status: Home / Self Care | Payer: Medicare Other | Source: Ambulatory Visit | Attending: Family Medicine | Admitting: Family Medicine

## 2020-01-12 DIAGNOSIS — Z1231 Encounter for screening mammogram for malignant neoplasm of breast: Secondary | ICD-10-CM | POA: Insufficient documentation

## 2020-01-14 ENCOUNTER — Encounter: Payer: Self-pay | Admitting: Registered Nurse

## 2020-01-14 LAB — TOXASSURE SELECT,+ANTIDEPR,UR

## 2020-01-21 ENCOUNTER — Telehealth: Payer: Self-pay | Admitting: *Deleted

## 2020-01-21 NOTE — Telephone Encounter (Signed)
Urine drug screen for this encounter is consistent for prescribed medication 

## 2020-01-22 ENCOUNTER — Ambulatory Visit (HOSPITAL_BASED_OUTPATIENT_CLINIC_OR_DEPARTMENT_OTHER)
Admission: RE | Admit: 2020-01-22 | Discharge: 2020-01-22 | Disposition: A | Discharge status: Home / Self Care | Payer: Medicare Other | Source: Ambulatory Visit | Attending: Family Medicine | Admitting: Family Medicine

## 2020-01-22 ENCOUNTER — Encounter: Payer: Self-pay | Admitting: Family Medicine

## 2020-01-22 ENCOUNTER — Other Ambulatory Visit: Payer: Self-pay

## 2020-01-22 DIAGNOSIS — Z122 Encounter for screening for malignant neoplasm of respiratory organs: Secondary | ICD-10-CM | POA: Insufficient documentation

## 2020-01-22 DIAGNOSIS — E041 Nontoxic single thyroid nodule: Secondary | ICD-10-CM | POA: Diagnosis not present

## 2020-01-22 DIAGNOSIS — Z8639 Personal history of other endocrine, nutritional and metabolic disease: Secondary | ICD-10-CM | POA: Diagnosis not present

## 2020-02-23 ENCOUNTER — Other Ambulatory Visit: Payer: Self-pay | Admitting: Family Medicine

## 2020-02-23 DIAGNOSIS — R350 Frequency of micturition: Secondary | ICD-10-CM

## 2020-03-01 DIAGNOSIS — H524 Presbyopia: Secondary | ICD-10-CM | POA: Diagnosis not present

## 2020-03-01 DIAGNOSIS — H2513 Age-related nuclear cataract, bilateral: Secondary | ICD-10-CM | POA: Diagnosis not present

## 2020-03-01 LAB — HM DIABETES EYE EXAM

## 2020-03-02 ENCOUNTER — Encounter: Payer: Self-pay | Admitting: Family Medicine

## 2020-03-09 NOTE — Progress Notes (Signed)
Subjective:   Susan Cortez is a 59 y.o. female who presents for Medicare Annual (Subsequent) preventive examination.  Review of Systems    Cardiac Risk Factors include: advanced age (>36men, >48 women);dyslipidemia;smoking/ tobacco exposure     Objective:    Today's Vitals   03/10/20 1115  BP: (!) 110/47  Pulse: 78  Temp: (!) 97 F (36.1 C)  TempSrc: Temporal  SpO2: 97%  Weight: 215 lb 12.8 oz (97.9 kg)  Height: 5\' 8"  (1.727 m)   Body mass index is 32.81 kg/m.  Advanced Directives 03/10/2020 12/14/2019 08/18/2019 03/09/2019 03/07/2018 06/26/2017 03/28/2017  Does Patient Have a Medical Advance Directive? No No No No No No No  Would patient like information on creating a medical advance directive? No - Patient declined No - Patient declined - No - Patient declined Yes (MAU/Ambulatory/Procedural Areas - Information given) - -    Current Medications (verified) Outpatient Encounter Medications as of 03/10/2020  Medication Sig  . acetaminophen-codeine (TYLENOL #4) 300-60 MG tablet Take 1 tablet by mouth daily.  . Calcium-Magnesium-Vitamin D (CALCIUM 500 PO) Take by mouth.  . cholecalciferol (VITAMIN D) 1000 units tablet Take 1,000 Units by mouth daily.  Marland Kitchen lovastatin (MEVACOR) 20 MG tablet TAKE 1 TABLET(20 MG) BY MOUTH AT BEDTIME  . MYRBETRIQ 50 MG TB24 tablet TAKE 1 TABLET BY MOUTH EVERY DAY  . polyethylene glycol powder (GLYCOLAX/MIRALAX) powder Take 17 g 2 (two) times daily as needed by mouth. Use for constipation  . triamcinolone cream (KENALOG) 0.1 % APPLY TO AFFECTED AREA TWICE A DAY  . tiotropium (SPIRIVA HANDIHALER) 18 MCG inhalation capsule Place 1 capsule (18 mcg total) into inhaler and inhale daily. (Patient not taking: Reported on 03/10/2020)  . [DISCONTINUED] doxycycline (VIBRAMYCIN) 100 MG capsule Take 1 capsule (100 mg total) by mouth 2 (two) times daily.   No facility-administered encounter medications on file as of 03/10/2020.    Allergies (verified) Chantix  [varenicline], Diclofenac, Meloxicam, Butrans [buprenorphine], Lyrica [pregabalin], Nsaids, and Tolmetin   History: Past Medical History:  Diagnosis Date  . Allergy   . Arthritis   . Basal cell carcinoma 2018   Removed from Right neck per patient    Past Surgical History:  Procedure Laterality Date  . CESAREAN SECTION     X 3  . DILATION AND CURETTAGE OF UTERUS    . SHOULDER ARTHROSCOPY WITH OPEN ROTATOR CUFF REPAIR Right 05/2009  . TONSILLECTOMY  1977  . TUBAL LIGATION     Family History  Problem Relation Age of Onset  . Diabetes Father   . Heart disease Father   . Hyperlipidemia Father    Social History   Socioeconomic History  . Marital status: Divorced    Spouse name: Not on file  . Number of children: Not on file  . Years of education: Not on file  . Highest education level: Not on file  Occupational History  . Not on file  Tobacco Use  . Smoking status: Current Every Day Smoker    Packs/day: 1.00    Years: 40.00    Pack years: 40.00  . Smokeless tobacco: Never Used  . Tobacco comment: pt declines  Vaping Use  . Vaping Use: Former  Substance and Sexual Activity  . Alcohol use: No  . Drug use: No  . Sexual activity: Not on file  Other Topics Concern  . Not on file  Social History Narrative  . Not on file   Social Determinants of Health   Financial Resource  Strain:   . Difficulty of Paying Living Expenses:   Food Insecurity:   . Worried About Charity fundraiser in the Last Year:   . Arboriculturist in the Last Year:   Transportation Needs:   . Film/video editor (Medical):   Marland Kitchen Lack of Transportation (Non-Medical):   Physical Activity:   . Days of Exercise per Week:   . Minutes of Exercise per Session:   Stress:   . Feeling of Stress :   Social Connections:   . Frequency of Communication with Friends and Family:   . Frequency of Social Gatherings with Friends and Family:   . Attends Religious Services:   . Active Member of Clubs or  Organizations:   . Attends Archivist Meetings:   Marland Kitchen Marital Status:     Tobacco Counseling Ready to quit: No Counseling given: No Comment: pt declines   Clinical Intake:  Pain : No/denies pain     Activities of Daily Living In your present state of health, do you have any difficulty performing the following activities: 03/10/2020  Hearing? N  Vision? N  Difficulty concentrating or making decisions? N  Walking or climbing stairs? N  Dressing or bathing? N  Doing errands, shopping? N  Preparing Food and eating ? N  Using the Toilet? N  In the past six months, have you accidently leaked urine? N  Do you have problems with loss of bowel control? N  Managing your Medications? N  Managing your Finances? N  Housekeeping or managing your Housekeeping? N  Some recent data might be hidden    Patient Care Team: Copland, Gay Filler, MD as PCP - General (Family Medicine) Kirsteins, Luanna Salk, MD as Consulting Physician (Pain Medicine) Jola Schmidt, MD as Consulting Physician (Ophthalmology)  Indicate any recent Medical Services you may have received from other than [facility] providers in the past year (date may be approximate).     Assessment:   This is a routine wellness examination for Susan Cortez.  Dietary issues and exercise activities discussed: Current Exercise Habits: The patient does not participate in regular exercise at present, Exercise limited by: None identified Diet (meal preparation, eat out, water intake, caffeinated beverages, dairy products, fruits and vegetables): well balanced     Goals    . Quit smoking / using tobacco    . Weight (lb) < 200 lb (90.7 kg)      Depression Screen PHQ 2/9 Scores 03/10/2020 03/09/2019 12/29/2018 03/07/2018 01/03/2018 06/26/2017 09/05/2016  PHQ - 2 Score 0 0 0 0 0 0 0  PHQ- 9 Score - - - - - - -  Exception Documentation - - - - - - -    Fall Risk Fall Risk  03/10/2020 07/14/2019 03/09/2019 12/29/2018 10/06/2018  Falls in the past  year? 0 0 0 0 0  Comment - - - - -  Number falls in past yr: 0 - - - -  Injury with Fall? 0 - - - -  Comment - - - - -  Risk for fall due to : - - - - -  Follow up Education provided;Falls prevention discussed - - - -   Lives alone in 1st floor condo. Any stairs in or around the home? No  If so, are there any without handrails? No  Home free of loose throw rugs in walkways, pet beds, electrical cords, etc? Yes  Adequate lighting in your home to reduce risk of falls? Yes   ASSISTIVE DEVICES  UTILIZED TO PREVENT FALLS:  Life alert? No  Use of a cane, walker or w/c? No  Grab bars in the bathroom? No  Shower chair or bench in shower? Yes  Elevated toilet seat or a handicapped toilet? No    Gait steady and fast without use of assistive device  Cognitive Function: Ad8 score reviewed for issues:  Issues making decisions:no  Less interest in hobbies / activities:no  Repeats questions, stories (family complaining):no  Trouble using ordinary gadgets (microwave, computer, phone):no  Forgets the month or year: no  Mismanaging finances: no  Remembering appts:no  Daily problems with thinking and/or memory:no Ad8 score is=0     MMSE - Mini Mental State Exam 03/07/2018 09/05/2016  Orientation to time 5 5  Orientation to Place 5 5  Registration 3 3  Attention/ Calculation 5 5  Recall 3 3  Language- name 2 objects 2 2  Language- repeat 1 1  Language- follow 3 step command 3 3  Language- read & follow direction 1 1  Write a sentence 1 1  Copy design 1 1  Total score 30 30        Immunizations Immunization History  Administered Date(s) Administered  . Influenza,inj,Quad PF,6+ Mos 06/11/2018, 06/03/2019  . PFIZER SARS-COV-2 Vaccination 11/30/2019, 12/28/2019  . Tdap 08/28/2011  . Zoster Recombinat (Shingrix) 06/03/2019, 08/06/2019    TDAP status: Up to date Flu Vaccine status: Up to date Covid-19 vaccine status: Completed vaccines  Qualifies for Shingles Vaccine?  Yes   Shingrix Completed?: Yes  Screening Tests Health Maintenance  Topic Date Due  . PAP SMEAR-Modifier  12/26/2018  . INFLUENZA VACCINE  03/27/2020  . TETANUS/TDAP  08/27/2021  . MAMMOGRAM  01/11/2022  . COLONOSCOPY  11/16/2027  . COVID-19 Vaccine  Completed  . Hepatitis C Screening  Completed  . HIV Screening  Completed    Health Maintenance  Health Maintenance Due  Topic Date Due  . PAP SMEAR-Modifier  12/26/2018    Colon cancer screening ---pt states she is due next year.  Mammogram status: Completed 01/13/20. Repeat every year Bone Density status: Completed 01/15/18. Results reflect: Bone density results: OSTEOPENIA. Repeat every 2 years.  Lung Cancer Screening: Scan done 06/10/19  Additional Screening:  Hepatitis C Screening: does qualify; Completed 12/26/15  Vision Screening: Recommended annual ophthalmology exams for early detection of glaucoma and other disorders of the eye. Is the patient up to date with their annual eye exam?  Yes  Who is the provider or what is the name of the office in which the patient attends annual eye exams? Dr.Bowen   Dental Screening: Recommended annual dental exams for proper oral hygiene  Community Resource Referral / Chronic Care Management: CRR required this visit?  No   CCM required this visit?  No      Plan:    Please schedule your next medicare wellness visit with me in 1 yr.  Continue to eat heart healthy diet (full of fruits, vegetables, whole grains, lean protein, water--limit salt, fat, and sugar intake) and increase physical activity as tolerated.  Continue doing brain stimulating activities (puzzles, reading, adult coloring books, staying active) to keep memory sharp.    I have personally reviewed and noted the following in the patient's chart:   . Medical and social history . Use of alcohol, tobacco or illicit drugs  . Current medications and supplements . Functional ability and status . Nutritional  status . Physical activity . Advanced directives . List of other physicians . Hospitalizations, surgeries,  and ER visits in previous 12 months . Vitals . Screenings to include cognitive, depression, and falls . Referrals and appointments  In addition, I have reviewed and discussed with patient certain preventive protocols, quality metrics, and best practice recommendations. A written personalized care plan for preventive services as well as general preventive health recommendations were provided to patient.    Pt prefers to view AVS on her mychart.  Shela Nevin, [place]   03/10/2020

## 2020-03-10 ENCOUNTER — Encounter: Payer: Self-pay | Admitting: *Deleted

## 2020-03-10 ENCOUNTER — Ambulatory Visit (INDEPENDENT_AMBULATORY_CARE_PROVIDER_SITE_OTHER): Payer: Medicare Other | Admitting: *Deleted

## 2020-03-10 ENCOUNTER — Other Ambulatory Visit: Payer: Self-pay

## 2020-03-10 VITALS — BP 110/51 | HR 78 | Temp 97.0°F | Ht 68.0 in | Wt 215.8 lb

## 2020-03-10 DIAGNOSIS — Z0000 Encounter for general adult medical examination without abnormal findings: Secondary | ICD-10-CM | POA: Diagnosis not present

## 2020-03-10 NOTE — Patient Instructions (Signed)
[name] , Thank you for taking time to come for your Medicare Wellness Visit. I appreciate your ongoing commitment to your health goals. Please review the following plan we discussed and let me know if I can assist you in the future.   Screening recommendations/referrals: Colon cancer screening ---pt states she is due next year.  Mammogram status: Completed 01/13/20. Repeat every year Bone Density status: Completed 01/15/18. Results reflect: Bone density results: OSTEOPENIA. Repeat every 2 years. Recommended yearly ophthalmology/optometry visit for glaucoma screening and checkup Recommended yearly dental visit for hygiene and checkup  Vaccinations: TDAP status: Up to date Flu Vaccine status: Up to date Covid-19 vaccine status: Completed vaccines  Qualifies for Shingles Vaccine? Yes   Shingrix Completed?: Yes   Advanced directives: Bring a copy of your living will and/or healthcare power of attorney to your next office visit.   Next appointment: Follow up in one year for your annual wellness visit.   Preventive Care 40-64 Years, Female Preventive care refers to lifestyle choices and visits with your health care provider that can promote health and wellness. What does preventive care include?  A yearly physical exam. This is also called an annual well check.  Dental exams once or twice a year.  Routine eye exams. Ask your health care provider how often you should have your eyes checked.  Personal lifestyle choices, including:  Daily care of your teeth and gums.  Regular physical activity.  Eating a healthy diet.  Avoiding tobacco and drug use.  Limiting alcohol use.  Practicing safe sex.  Taking low-dose aspirin daily starting at age 42.  Taking vitamin and mineral supplements as recommended by your health care provider. What happens during an annual well check? The services and screenings done by your health care provider during your annual well check will depend  on your age, overall health, lifestyle risk factors, and family history of disease. Counseling  Your health care provider may ask you questions about your:  Alcohol use.  Tobacco use.  Drug use.  Emotional well-being.  Home and relationship well-being.  Sexual activity.  Eating habits.  Work and work Statistician.  Method of birth control.  Menstrual cycle.  Pregnancy history. Screening  You may have the following tests or measurements:  Height, weight, and BMI.  Blood pressure.  Lipid and cholesterol levels. These may be checked every 5 years, or more frequently if you are over 62 years old.  Skin check.  Lung cancer screening. You may have this screening every year starting at age 22 if you have a 30-pack-year history of smoking and currently smoke or have quit within the past 15 years.  Fecal occult blood test (FOBT) of the stool. You may have this test every year starting at age 2.  Flexible sigmoidoscopy or colonoscopy. You may have a sigmoidoscopy every 5 years or a colonoscopy every 10 years starting at age 73.  Hepatitis C blood test.  Hepatitis B blood test.  Sexually transmitted disease (STD) testing.  Diabetes screening. This is done by checking your blood sugar (glucose) after you have not eaten for a while (fasting). You may have this done every 1-3 years.  Mammogram. This may be done every 1-2 years. Talk to your health care provider about when you should start having regular mammograms. This may depend on whether you have a family history of breast cancer.  BRCA-related cancer screening. This may be done if you have a family history of breast, ovarian, tubal, or peritoneal cancers.  Pelvic exam and Pap test. This may be done every 3 years starting at age 12. Starting at age 49, this may be done every 5 years if you have a Pap test in combination with an HPV test.  Bone density scan. This is done to screen for osteoporosis. You may have this scan  if you are at high risk for osteoporosis. Discuss your test results, treatment options, and if necessary, the need for more tests with your health care provider. Vaccines  Your health care provider may recommend certain vaccines, such as:  Influenza vaccine. This is recommended every year.  Tetanus, diphtheria, and acellular pertussis (Tdap, Td) vaccine. You may need a Td booster every 10 years.  Zoster vaccine. You may need this after age 32.  Pneumococcal 13-valent conjugate (PCV13) vaccine. You may need this if you have certain conditions and were not previously vaccinated.  Pneumococcal polysaccharide (PPSV23) vaccine. You may need one or two doses if you smoke cigarettes or if you have certain conditions. Talk to your health care provider about which screenings and vaccines you need and how often you need them. This information is not intended to replace advice given to you by your health care provider. Make sure you discuss any questions you have with your health care provider. Document Released: 09/09/2015 Document Revised: 05/02/2016 Document Reviewed: 06/14/2015 Elsevier Interactive Patient Education  2017 [employer] Prevention in the Home Falls can cause injuries. They can happen to people of all ages. There are many things you can do to make your home safe and to help prevent falls. What can I do on the outside of my home?  Regularly fix the edges of walkways and driveways and fix any cracks.  Remove anything that might make you trip as you walk through a door, such as a raised step or threshold.  Trim any bushes or trees on the path to your home.  Use bright outdoor lighting.  Clear any walking paths of anything that might make someone trip, such as rocks or tools.  Regularly check to see if handrails are loose or broken. Make sure that both sides of any steps have handrails.  Any raised decks and porches should have guardrails on the edges.  Have any  leaves, snow, or ice cleared regularly.  Use sand or salt on walking paths during winter.  Clean up any spills in your garage right away. This includes oil or grease spills. What can I do in the bathroom?  Use night lights.  Install grab bars by the toilet and in the tub and shower. Do not use towel bars as grab bars.  Use non-skid mats or decals in the tub or shower.  If you need to sit down in the shower, use a plastic, non-slip stool.  Keep the floor dry. Clean up any water that spills on the floor as soon as it happens.  Remove soap buildup in the tub or shower regularly.  Attach bath mats securely with double-sided non-slip rug tape.  Do not have throw rugs and other things on the floor that can make you trip. What can I do in the bedroom?  Use night lights.  Make sure that you have a light by your bed that is easy to reach.  Do not use any sheets or blankets that are too big for your bed. They should not hang down onto the floor.  Have a firm chair that has side arms. You can use this for  support while you get dressed.  Do not have throw rugs and other things on the floor that can make you trip. What can I do in the kitchen?  Clean up any spills right away.  Avoid walking on wet floors.  Keep items that you use a lot in easy-to-reach places.  If you need to reach something above you, use a strong step stool that has a grab bar.  Keep electrical cords out of the way.  Do not use floor polish or wax that makes floors slippery. If you must use wax, use non-skid floor wax.  Do not have throw rugs and other things on the floor that can make you trip. What can I do with my stairs?  Do not leave any items on the stairs.  Make sure that there are handrails on both sides of the stairs and use them. Fix handrails that are broken or loose. Make sure that handrails are as long as the stairways.  Check any carpeting to make sure that it is firmly attached to the stairs.  Fix any carpet that is loose or worn.  Avoid having throw rugs at the top or bottom of the stairs. If you do have throw rugs, attach them to the floor with carpet tape.  Make sure that you have a light switch at the top of the stairs and the bottom of the stairs. If you do not have them, ask someone to add them for you. What else can I do to help prevent falls?  Wear shoes that:  Do not have high heels.  Have rubber bottoms.  Are comfortable and fit you well.  Are closed at the toe. Do not wear sandals.  If you use a stepladder:  Make sure that it is fully opened. Do not climb a closed stepladder.  Make sure that both sides of the stepladder are locked into place.  Ask someone to hold it for you, if possible.  Clearly mark and make sure that you can see:  Any grab bars or handrails.  First and last steps.  Where the edge of each step is.  Use tools that help you move around (mobility aids) if they are needed. These include:  Canes.  Walkers.  Scooters.  Crutches.  Turn on the lights when you go into a dark area. Replace any light bulbs as soon as they burn out.  Set up your furniture so you have a clear path. Avoid moving your furniture around.  If any of your floors are uneven, fix them.  If there are any pets around you, be aware of where they are.  Review your medicines with your doctor. Some medicines can make you feel dizzy. This can increase your chance of falling. Ask your doctor what other things that you can do to help prevent falls. This information is not intended to replace advice given to you by your health care provider. Make sure you discuss any questions you have with your health care provider. Document Released: 06/09/2009 Document Revised: 01/19/2016 Document Reviewed: 09/17/2014 Elsevier Interactive Patient Education  2017 [employer].

## 2020-04-25 ENCOUNTER — Encounter: Payer: Self-pay | Admitting: Registered Nurse

## 2020-04-25 ENCOUNTER — Other Ambulatory Visit: Payer: Self-pay

## 2020-04-25 ENCOUNTER — Encounter: Payer: Medicare Other | Attending: Registered Nurse | Admitting: Registered Nurse

## 2020-04-25 VITALS — BP 133/87 | HR 75 | Temp 98.9°F | Ht 68.0 in | Wt 217.0 lb

## 2020-04-25 DIAGNOSIS — M545 Low back pain: Secondary | ICD-10-CM | POA: Diagnosis not present

## 2020-04-25 DIAGNOSIS — M4802 Spinal stenosis, cervical region: Secondary | ICD-10-CM | POA: Diagnosis not present

## 2020-04-25 DIAGNOSIS — M25511 Pain in right shoulder: Secondary | ICD-10-CM

## 2020-04-25 DIAGNOSIS — Z79891 Long term (current) use of opiate analgesic: Secondary | ICD-10-CM

## 2020-04-25 DIAGNOSIS — G8929 Other chronic pain: Secondary | ICD-10-CM | POA: Diagnosis not present

## 2020-04-25 DIAGNOSIS — M542 Cervicalgia: Secondary | ICD-10-CM | POA: Diagnosis not present

## 2020-04-25 DIAGNOSIS — M5412 Radiculopathy, cervical region: Secondary | ICD-10-CM | POA: Diagnosis not present

## 2020-04-25 DIAGNOSIS — M546 Pain in thoracic spine: Secondary | ICD-10-CM | POA: Insufficient documentation

## 2020-04-25 DIAGNOSIS — G894 Chronic pain syndrome: Secondary | ICD-10-CM | POA: Diagnosis not present

## 2020-04-25 DIAGNOSIS — Z5181 Encounter for therapeutic drug level monitoring: Secondary | ICD-10-CM

## 2020-04-25 MED ORDER — ACETAMINOPHEN-CODEINE #4 300-60 MG PO TABS: 1.0000 | ORAL_TABLET | Freq: Every day | ORAL | 2 refills | Status: DC

## 2020-04-25 NOTE — Progress Notes (Signed)
Subjective:    Patient ID: Susan Cortez, female    DOB: June 16, 1961, 59 y.o.   MRN: [id number]  HPI: Susan Cortez is a 59 y.o. female who returns for follow up appointment for chronic pain and medication refill. She states her pain is located in her neck radiating into her right shoulder and upper thoracic region mainly right side and lower back pain. She  rates her pain 7. Her  current exercise regime is walking and performing stretching exercises.  [name] Morphine equivalent is 9.00 MME.  Last UDS was Performed on 01/11/2020, it was consistent.    Pain Inventory Average Pain 5 Pain Right Now 7 My pain is sharp, burning, stabbing and aching  In the last 24 hours, has pain interfered with the following? General activity 1 Relation with others 0 Enjoyment of life 1 What TIME of day is your pain at its worst? daytime and evening Sleep (in general) Poor  Pain is worse with: walking, standing and some activites Pain improves with: rest and medication Relief from Meds: 8  walk without assistance Do you have any goals in this area?  no  Do you have any goals in this area?  no  No problems in this area  Any changes since last visit?  no  Any changes since last visit?  no    Family History  Problem Relation Age of Onset  . Diabetes Father   . Heart disease Father   . Hyperlipidemia Father    Social History   Socioeconomic History  . Marital status: Divorced    Spouse name: Not on file  . Number of children: Not on file  . Years of education: Not on file  . Highest education level: Not on file  Occupational History  . Not on file  Tobacco Use  . Smoking status: Current Every Day Smoker    Packs/day: 1.00    Years: 40.00    Pack years: 40.00  . Smokeless tobacco: Never Used  . Tobacco comment: pt declines  Vaping Use  . Vaping Use: Former  Substance and Sexual Activity  . Alcohol use: No  . Drug use: No  . Sexual activity: Not on file  Other  Topics Concern  . Not on file  Social History Narrative  . Not on file   Social Determinants of Health   Financial Resource Strain: Low Risk   . Difficulty of Paying Living Expenses: Not hard at all  Food Insecurity: No Food Insecurity  . Worried About Charity fundraiser in the Last Year: Never true  . Ran Out of Food in the Last Year: Never true  Transportation Needs: No Transportation Needs  . Lack of Transportation (Medical): No  . Lack of Transportation (Non-Medical): No  Physical Activity:   . Days of Exercise per Week: Not on file  . Minutes of Exercise per Session: Not on file  Stress:   . Feeling of Stress : Not on file  Social Connections:   . Frequency of Communication with Friends and Family: Not on file  . Frequency of Social Gatherings with Friends and Family: Not on file  . Attends Religious Services: Not on file  . Active Member of Clubs or Organizations: Not on file  . Attends Archivist Meetings: Not on file  . Marital Status: Not on file   Past Surgical History:  Procedure Laterality Date  . CESAREAN SECTION     X 3  . DILATION AND  CURETTAGE OF UTERUS    . SHOULDER ARTHROSCOPY WITH OPEN ROTATOR CUFF REPAIR Right 05/2009  . TONSILLECTOMY  1977  . TUBAL LIGATION     Past Medical History:  Diagnosis Date  . Allergy   . Arthritis   . Basal cell carcinoma 2018   Removed from Right neck per patient    BP 133/87   Pulse 75   Temp 98.9 F (37.2 C)   Ht 5\' 8"  (1.727 m)   Wt 217 lb (98.4 kg)   SpO2 97%   BMI 32.99 kg/m   Opioid Risk Score:   Fall Risk Score:  `1  Depression screen PHQ 2/9  Depression screen [facility] 2/9 03/10/2020 03/09/2019 12/29/2018 03/07/2018 01/03/2018 06/26/2017 09/05/2016  Decreased Interest 0 0 0 0 0 0 0  Down, Depressed, Hopeless 0 0 0 0 0 0 0  PHQ - 2 Score 0 0 0 0 0 0 0  Altered sleeping - - - - - - -  Tired, decreased energy - - - - - - -  Change in appetite - - - - - - -  Feeling bad or failure about yourself  - -  - - - - -  Trouble concentrating - - - - - - -  Moving slowly or fidgety/restless - - - - - - -  Suicidal thoughts - - - - - - -  PHQ-9 Score - - - - - - -    Review of Systems  Constitutional: Positive for diaphoresis.  HENT: Negative.   Respiratory: Negative.   Cardiovascular: Negative.   Gastrointestinal: Positive for constipation.  Endocrine: Negative.   Genitourinary: Negative.   Musculoskeletal: Positive for arthralgias, back pain and neck pain.  Skin: Negative.   Allergic/Immunologic: Negative.   Neurological: Negative.   Hematological: Negative.   Psychiatric/Behavioral: Negative.        Objective:   Physical Exam Vitals and nursing note reviewed.  Constitutional:      Appearance: Normal appearance.  Neck:     Comments: Cervical Paraspinal Tenderness: C-5-C-6 Cardiovascular:     Rate and Rhythm: Normal rate and regular rhythm.     Pulses: Normal pulses.     Heart sounds: Normal heart sounds.  Pulmonary:     Effort: Pulmonary effort is normal.     Breath sounds: Normal breath sounds.  Musculoskeletal:     Cervical back: Normal range of motion and neck supple.     Comments: Normal Muscle Bulk and Muscle Testing Reveals:  Upper Extremities: Right: Decreased ROM 90 DEgrees and Muscle Strength 5/5 Left Full ROM and Muscle Strength 5/5   Lumbar Paraspinal Tenderness: L-3-L-5 Lower Extremities: Full ROM and Muscle Strength 5/5 Arises from chair with ease Narrow Based Gait   Skin:    General: Skin is warm and dry.  Neurological:     Mental Status: She is alert and oriented to person, place, and time.  Psychiatric:        Mood and Affect: Mood normal.        Behavior: Behavior normal.           Assessment & Plan:  1.Cervical spinal stenosis without evidence ofradiculopathy. Chronic neck pain: Refilled Tylenol #4 300/60mg  daily, #30.04/25/2020. We will continue the opioid monitoring program, this consists of regular clinic visits, examinations, urine  drug screen, pill counts as well as use of [place] Controlled Substance Reporting system. A 12 month History has been reviewed on the [place] Controlled Substance Reporting System on 04/25/2020. 2.  Right shoulder rotator cuff tear, status post repair with chronic pain and contracture : Continue with exercise regimeas tolerated.04/25/2020. 3. Insomnia:Continue Melatonin.04/25/2020. 4. Chronic Bilateral Lower Back Pain without Sciatica: Continue HEP as tolerated. Continue to Monitor.04/25/2020 5. Right Greater Trochanter Bursitis:No complaints Today.Continue to Alternate Ice and Heat Therapy. Continue to Monitor.04/25/2020 6. Chronic right Sided Thoracic Back Pain: Continue current medication regime. Continue HEP as tolerated. Continue to monitor.04/25/2020  20 minutes of face to face patient care time was spent during this visit. All questions were encouraged and answered.   F/U 3 months

## 2020-04-26 DIAGNOSIS — Z20822 Contact with and (suspected) exposure to covid-19: Secondary | ICD-10-CM | POA: Diagnosis not present

## 2020-05-03 NOTE — Progress Notes (Signed)
[place] at [facility] [address] [place], [place] [address] [phone]  Date:  05/04/2020   Name:  Susan Cortez   DOB:  Apr 19, 1961   MRN:  [id number]  PCP:  Darreld Mclean, MD    Chief Complaint: No chief complaint on file.   History of Present Illness:  Susan Cortez is a 59 y.o. very pleasant female patient who presents with the following:  Virtual visit today for illness Patient location is home, provider is at office.  Patient identity is confirmed 2 factors, she gives consent for virtual visit today.  The pt and myself are present on the call today  I most recently saw Susan Cortez in May of this year for follow-up History of cervical spinal stenosis, lower back pain, intermittent claudication, dyslipidemia, prediabetes, urinary incontinence  She has completed her COVID-19 vaccine series as of May 3 Today Susan Cortez notes illness for about 2 weeks- 8/31 she felt very fatigued, and on 9/4 she noted onset of possible Her grandson was with her, and he was ill with a fever up to 102- he got better, he tested negative negative for covid 19 She did get tested for covid last week and it was negative Her temps have run 101.4 max + for ST, headache She is coughing but mostly dry- she is getting green discharge from her nose Sx are more in her sinuses than in her chest No vomiting or diarrhea Her most recent fever was this am- 99.2 No dysuria No rashes, she is not aware of any tick bites She is not feeling SOB She is no longer using spiriva as it did not seem to help- she does not miss it  Patient Active Problem List   Diagnosis Date Noted  . Encounter for screening for lung cancer 01/04/2020  . Porokeratosis 12/02/2019  . Plantar flexed metatarsal bone of left foot 12/02/2019  . Plantar flexed metatarsal bone of right foot 12/02/2019  . Prediabetes 05/31/2019  . Dyslipidemia 03/01/2018  . De Quervain's disease (tenosynovitis)  03/28/2017  . Chronic bilateral low back pain without sciatica 03/28/2017  . Sebaceous hyperplasia of face 06/26/2016  . Chronic right shoulder pain 01/02/2016  . Adenoma of left adrenal gland 12/26/2015  . Intermittent claudication ([place]) 12/26/2015  . Rotator cuff (capsule) sprain 03/26/2014  . Spinal stenosis in cervical region 03/26/2014  . Chronic pain 03/26/2014    Past Medical History:  Diagnosis Date  . Allergy   . Arthritis   . Basal cell carcinoma 2018   Removed from Right neck per patient     Past Surgical History:  Procedure Laterality Date  . CESAREAN SECTION     X 3  . DILATION AND CURETTAGE OF UTERUS    . SHOULDER ARTHROSCOPY WITH OPEN ROTATOR CUFF REPAIR Right 05/2009  . TONSILLECTOMY  1977  . TUBAL LIGATION      Social History   Tobacco Use  . Smoking status: Current Every Day Smoker    Packs/day: 1.00    Years: 40.00    Pack years: 40.00  . Smokeless tobacco: Never Used  . Tobacco comment: pt declines  Vaping Use  . Vaping Use: Former  Substance Use Topics  . Alcohol use: No  . Drug use: No    Family History  Problem Relation Age of Onset  . Diabetes Father   . Heart disease Father   . Hyperlipidemia Father     Allergies  Allergen  Reactions  . Chantix [Varenicline] Other (See Comments)    Strange dreams/hallucinations  . Diclofenac Swelling  . Meloxicam Swelling  . Butrans [Buprenorphine] Itching    Itching and redness at the area of the patch.  Recardo Evangelist [Pregabalin]     Headache,severe eye pain, swelling  . Nsaids     Stomach pain  . Tolmetin Other (See Comments)    Stomach pain    Medication list has been reviewed and updated.  Current Outpatient Medications on File Prior to Visit  Medication Sig Dispense Refill  . acetaminophen-codeine (TYLENOL #4) 300-60 MG tablet Take 1 tablet by mouth daily. 30 tablet 2  . Calcium-Magnesium-Vitamin D (CALCIUM 500 PO) Take by mouth.    . cholecalciferol (VITAMIN D) 1000 units tablet Take  1,000 Units by mouth daily.    Marland Kitchen lovastatin (MEVACOR) 20 MG tablet TAKE 1 TABLET(20 MG) BY MOUTH AT BEDTIME 90 tablet 3  . MYRBETRIQ 50 MG TB24 tablet TAKE 1 TABLET BY MOUTH EVERY DAY 90 tablet 2  . polyethylene glycol powder (GLYCOLAX/MIRALAX) powder Take 17 g 2 (two) times daily as needed by mouth. Use for constipation 3350 g 6  . tiotropium (SPIRIVA HANDIHALER) 18 MCG inhalation capsule Place 1 capsule (18 mcg total) into inhaler and inhale daily. 30 capsule 12  . triamcinolone cream (KENALOG) 0.1 % APPLY TO AFFECTED AREA TWICE A DAY     No current facility-administered medications on file prior to visit.    Review of Systems: As per HPI- otherwise negative.   Physical Examination: There were no vitals filed for this visit. There were no vitals filed for this visit. There is no height or weight on file to calculate BMI. Ideal Body Weight:    Patient observed her video monitor.  She looks well, her normal self.  In good spirits, no distress or cough, no shortness of breath noted Patient is not checking other vital signs besides her temperature  Assessment and Plan: Acute non-recurrent frontal sinusitis - Plan: doxycycline (VIBRA-TABS) 100 MG tablet   Virtual visit today for symptoms of illness for about 10 days.  Patient has noticed low-grade fevers, sinus pain and pressure, discolored nasal discharge.  She has been fully immunized against COVID-19 and was tested recently, negative We will treat her for sinus infection with doxycycline, 100 mg twice a day for 10 days.  This should also cover chest infection or any possible tickborne illness.  Patient is asked to contact me if she is not feeling significantly better in the next 2 to 3 days, sooner if she is getting worse.  Invited and answered all questions, patient states understanding and agreement with the plan  Video monitor used for the entire visit today  Signed Lamar Blinks, MD

## 2020-05-04 ENCOUNTER — Telehealth (INDEPENDENT_AMBULATORY_CARE_PROVIDER_SITE_OTHER): Payer: Medicare Other | Admitting: Family Medicine

## 2020-05-04 ENCOUNTER — Other Ambulatory Visit: Payer: Self-pay

## 2020-05-04 DIAGNOSIS — J0110 Acute frontal sinusitis, unspecified: Secondary | ICD-10-CM

## 2020-05-04 MED ORDER — DOXYCYCLINE HYCLATE 100 MG PO TABS: 100.0000 mg | ORAL_TABLET | Freq: Two times a day (BID) | ORAL | 0 refills | Status: DC

## 2020-05-06 ENCOUNTER — Encounter: Payer: Self-pay | Admitting: Family Medicine

## 2020-05-10 DIAGNOSIS — Z20822 Contact with and (suspected) exposure to covid-19: Secondary | ICD-10-CM | POA: Diagnosis not present

## 2020-05-23 DIAGNOSIS — L218 Other seborrheic dermatitis: Secondary | ICD-10-CM | POA: Diagnosis not present

## 2020-05-23 DIAGNOSIS — D1801 Hemangioma of skin and subcutaneous tissue: Secondary | ICD-10-CM | POA: Diagnosis not present

## 2020-05-23 DIAGNOSIS — Z85828 Personal history of other malignant neoplasm of skin: Secondary | ICD-10-CM | POA: Diagnosis not present

## 2020-05-23 DIAGNOSIS — L821 Other seborrheic keratosis: Secondary | ICD-10-CM | POA: Diagnosis not present

## 2020-05-23 DIAGNOSIS — L723 Sebaceous cyst: Secondary | ICD-10-CM | POA: Diagnosis not present

## 2020-05-24 LAB — HEMOGLOBIN A1C: Hemoglobin A1C: 5.5

## 2020-05-29 NOTE — Progress Notes (Addendum)
[place] at [facility] [place], [place], [place] [address] [phone]  Date:  06/08/2020   Name:  Susan Cortez   DOB:  [birth year]   MRN:  [id number]  PCP:  Darreld Mclean, MD    Chief Complaint: Annual Exam   History of Present Illness:  Susan Cortez is a 59 y.o. very pleasant female patient who presents with the following:  Patient here today for physical exam- History of cervical spinal stenosis, lower back pain, intermittent claudication, dyslipidemia, prediabetes  Last seen by myself in May, we also had a video visit last month with acute illness/sinus infection- she got back to normal but she has some tinnitus in her left ear only She describes this as a static sound, it will come and go but is most bothersome at night.  This has been present for 2 or 3 weeks She is coughing up some brown mucus still, although her respiratory symptoms are resolved.  Of note, she does have about a 40-pack-year history of tobacco.  She does take Spiriva  Pap smear- will do next year, patient declines due today Flu vaccine- she declines today as she tends to get ill following flu shot Mammogram is up-to-date Colon cancer screen up-to-date- she would like to be referred to [facility] for her 3 year follow-up which is due in the spring.  She also notes issues with constipation recently Lung cancer screening now due-current smoker, will order for her  covid vaccine done -she had significant symptoms of each dose, she is not really interested in doing a booster  Update complete labs today- she did have an A1c done per a home nurse check recently, 5.5%-will abstract She uses tyelenol chronically for arthritis pain  Patient Active Problem List   Diagnosis Date Noted  . Encounter for screening for lung cancer 01/04/2020  . Porokeratosis 12/02/2019  . Plantar flexed metatarsal bone of left foot 12/02/2019  . Plantar flexed metatarsal  bone of right foot 12/02/2019  . Prediabetes 05/31/2019  . Dyslipidemia 03/01/2018  . De Quervain's disease (tenosynovitis) 03/28/2017  . Chronic bilateral low back pain without sciatica 03/28/2017  . Sebaceous hyperplasia of face 06/26/2016  . Chronic right shoulder pain 01/02/2016  . Adenoma of left adrenal gland 12/26/2015  . Intermittent claudication ([place]) 12/26/2015  . Rotator cuff (capsule) sprain 03/26/2014  . Spinal stenosis in cervical region 03/26/2014  . Chronic pain 03/26/2014    Past Medical History:  Diagnosis Date  . Allergy   . Arthritis   . Basal cell carcinoma 2018   Removed from Right neck per patient     Past Surgical History:  Procedure Laterality Date  . CESAREAN SECTION     X 3  . DILATION AND CURETTAGE OF UTERUS    . SHOULDER ARTHROSCOPY WITH OPEN ROTATOR CUFF REPAIR Right 05/2009  . TONSILLECTOMY  1977  . TUBAL LIGATION      Social History   Tobacco Use  . Smoking status: Current Every Day Smoker    Packs/day: 1.00    Years: 40.00    Pack years: 40.00  . Smokeless tobacco: Never Used  . Tobacco comment: pt declines  Vaping Use  . Vaping Use: Former  Substance Use Topics  . Alcohol use: No  . Drug use: No    Family History  Problem Relation Age of Onset  . Diabetes Father   . Heart disease Father   . Hyperlipidemia  Father     Allergies  Allergen Reactions  . Chantix [Varenicline] Other (See Comments)    Strange dreams/hallucinations  . Diclofenac Swelling  . Meloxicam Swelling  . Butrans [Buprenorphine] Itching    Itching and redness at the area of the patch.  Recardo Evangelist [Pregabalin]     Headache,severe eye pain, swelling  . Nsaids     Stomach pain  . Tolmetin Other (See Comments)    Stomach pain    Medication list has been reviewed and updated.  Current Outpatient Medications on File Prior to Visit  Medication Sig Dispense Refill  . acetaminophen-codeine (TYLENOL #4) 300-60 MG tablet Take 1 tablet by mouth daily. 30  tablet 2  . Calcium-Magnesium-Vitamin D (CALCIUM 500 PO) Take by mouth.    . cholecalciferol (VITAMIN D) 1000 units tablet Take 1,000 Units by mouth daily.    Marland Kitchen lovastatin (MEVACOR) 20 MG tablet TAKE 1 TABLET(20 MG) BY MOUTH AT BEDTIME 90 tablet 3  . MYRBETRIQ 50 MG TB24 tablet TAKE 1 TABLET BY MOUTH EVERY DAY 90 tablet 2  . polyethylene glycol powder (GLYCOLAX/MIRALAX) powder Take 17 g 2 (two) times daily as needed by mouth. Use for constipation 3350 g 6  . tiotropium (SPIRIVA HANDIHALER) 18 MCG inhalation capsule Place 1 capsule (18 mcg total) into inhaler and inhale daily. 30 capsule 12  . triamcinolone cream (KENALOG) 0.1 % APPLY TO AFFECTED AREA TWICE A DAY     No current facility-administered medications on file prior to visit.    Review of Systems:  As per HPI- otherwise negative.   Physical Examination: Vitals:   06/08/20 1021  BP: 110/70  Pulse: 73  Resp: 12  Temp: 98.3 F (36.8 C)  SpO2: 97%   Vitals:   06/08/20 1021  Weight: 221 lb 9.6 oz (100.5 kg)  Height: 5\' 8"  (1.727 m)   Body mass index is 33.69 kg/m. Ideal Body Weight: Weight in (lb) to have BMI = 25: 164.1  GEN: no acute distress.  Overweight, otherwise looks well.  Smoker HEENT: Atraumatic, Normocephalic. Bilateral TM wnl, oropharynx normal.  PEERL,EOMI.   there is a clear fluid effusion behind the left TM Ears and Nose: No external deformity. CV: RRR, No M/G/R. No JVD. No thrill. No extra heart sounds. PULM: CTA B, no wheezes, crackles, rhonchi. No retractions. No resp. distress. No accessory muscle use. ABD: S, NT, ND, +BS. No rebound. No HSM. EXTR: No c/c/e PSYCH: Normally interactive. Conversant.    Assessment and Plan: Physical exam  Prediabetes - Plan: Comprehensive metabolic panel  Screening for deficiency anemia - Plan: CBC  Screening for thyroid disorder - Plan: TSH  Dyslipidemia - Plan: Lipid panel  Chronic bilateral low back pain without sciatica  Screening for colon cancer -  Plan: Ambulatory referral to Gastroenterology  Encounter for screening for lung cancer - Plan: CT CHEST LUNG CA SCREEN LOW DOSE W/O CM  Tinnitus, left    Here today for a physical exam Referral to GI for chronic constipation and upcoming colonoscopy Ordered CT scan to screen for lung cancer Routine labs otherwise pending as above We discussed chronic productive cough.  I suspect this is likely chronic bronchitis from smoking.  Suggested that she try Mucinex as needed.  Will also obtain lung cancer screening CT Tinnitus of the left ear-may be related to recent illness Patient does seem to have a fluid effusion behind the left eardrum.  Suggested that she try an over-the-counter nasal steroid spray.  If this does not relieve  tinnitus in 1 to 2 weeks she is asked to please let me know This visit occurred during the SARS-CoV-2 public health emergency.  Safety protocols were in place, including screening questions prior to the visit, additional usage of staff PPE, and extensive cleaning of exam room while observing appropriate contact time as indicated for disinfecting solutions.    Signed Lamar Blinks, MD  Received her labs as below, 10/14 Results for orders placed or performed in visit on 06/08/20  CBC  Result Value Ref Range   WBC 10.3 3.8 - 10.8 Thousand/uL   RBC 4.74 3.80 - 5.10 Million/uL   Hemoglobin 15.0 11.7 - 15.5 g/dL   HCT 44.2 35 - 45 %   MCV 93.2 80.0 - 100.0 fL   MCH 31.6 27.0 - 33.0 pg   MCHC 33.9 32.0 - 36.0 g/dL   RDW 12.5 11.0 - 15.0 %   Platelets 378 140 - 400 Thousand/uL   MPV 10.6 7.5 - 12.5 fL  Comprehensive metabolic panel  Result Value Ref Range   Glucose, Bld 94 65 - 99 mg/dL   BUN 6 (L) 7 - 25 mg/dL   Creat 0.65 0.50 - 0.99 mg/dL   BUN/Creatinine Ratio 9 6 - 22 (calc)   Sodium 138 135 - 146 mmol/L   Potassium 4.5 3.5 - 5.3 mmol/L   Chloride 105 98 - 110 mmol/L   CO2 25 20 - 32 mmol/L   Calcium 10.0 8.6 - 10.4 mg/dL   Total Protein 7.1 6.1 - 8.1  g/dL   Albumin 4.6 3.6 - 5.1 g/dL   Globulin 2.5 1.9 - 3.7 g/dL (calc)   AG Ratio 1.8 1.0 - 2.5 (calc)   Total Bilirubin 0.4 0.2 - 1.2 mg/dL   Alkaline phosphatase (APISO) 58 37 - 153 U/L   AST 23 10 - 35 U/L   ALT 24 6 - 29 U/L  Lipid panel  Result Value Ref Range   Cholesterol 175 <200 mg/dL   HDL 53 > OR = 50 mg/dL   Triglycerides 185 (H) <150 mg/dL   LDL Cholesterol (Calc) 94 mg/dL (calc)   Total CHOL/HDL Ratio 3.3 <5.0 (calc)   Non-HDL Cholesterol (Calc) 122 <130 mg/dL (calc)  TSH  Result Value Ref Range   TSH 2.06 0.40 - 4.50 mIU/L

## 2020-06-08 ENCOUNTER — Ambulatory Visit (INDEPENDENT_AMBULATORY_CARE_PROVIDER_SITE_OTHER): Payer: Medicare Other | Admitting: Family Medicine

## 2020-06-08 ENCOUNTER — Encounter: Payer: Self-pay | Admitting: Family Medicine

## 2020-06-08 ENCOUNTER — Other Ambulatory Visit: Payer: Self-pay

## 2020-06-08 VITALS — BP 110/70 | HR 73 | Temp 98.3°F | Resp 12 | Ht 68.0 in | Wt 221.6 lb

## 2020-06-08 DIAGNOSIS — Z0000 Encounter for general adult medical examination without abnormal findings: Secondary | ICD-10-CM

## 2020-06-08 DIAGNOSIS — E785 Hyperlipidemia, unspecified: Secondary | ICD-10-CM

## 2020-06-08 DIAGNOSIS — H9312 Tinnitus, left ear: Secondary | ICD-10-CM

## 2020-06-08 DIAGNOSIS — Z1329 Encounter for screening for other suspected endocrine disorder: Secondary | ICD-10-CM | POA: Diagnosis not present

## 2020-06-08 DIAGNOSIS — Z1211 Encounter for screening for malignant neoplasm of colon: Secondary | ICD-10-CM

## 2020-06-08 DIAGNOSIS — G8929 Other chronic pain: Secondary | ICD-10-CM

## 2020-06-08 DIAGNOSIS — M545 Low back pain, unspecified: Secondary | ICD-10-CM

## 2020-06-08 DIAGNOSIS — Z130 Encounter for screening for diseases of the blood and blood-forming organs and certain disorders involving the immune mechanism: Secondary | ICD-10-CM | POA: Diagnosis not present

## 2020-06-08 DIAGNOSIS — R7303 Prediabetes: Secondary | ICD-10-CM

## 2020-06-08 DIAGNOSIS — Z122 Encounter for screening for malignant neoplasm of respiratory organs: Secondary | ICD-10-CM

## 2020-06-08 NOTE — Patient Instructions (Signed)
It was good to see you again today! I will be in touch with your labs Pap next year for sure!  I ordered your cancer screening lung CT- stop by imaging on the way out and see if they can do this for you I also ordered a referral to GI; you can go ahead and see them to discuss constipation prior to having your colonoscopy if you like  Take care!    Health Maintenance After Age 60 After age 78, you are at a higher risk for certain long-term diseases and infections as well as injuries from falls. Falls are a major cause of broken bones and head injuries in people who are older than age 21. Getting regular preventive care can help to keep you healthy and well. Preventive care includes getting regular testing and making lifestyle changes as recommended by your health care provider. Talk with your health care provider about:  Which screenings and tests you should have. A screening is a test that checks for a disease when you have no symptoms.  A diet and exercise plan that is right for you. What should I know about screenings and tests to prevent falls? Screening and testing are the best ways to find a health problem early. Early diagnosis and treatment give you the best chance of managing medical conditions that are common after age 33. Certain conditions and lifestyle choices may make you more likely to have a fall. Your health care provider may recommend:  Regular vision checks. Poor vision and conditions such as cataracts can make you more likely to have a fall. If you wear glasses, make sure to get your prescription updated if your vision changes.  Medicine review. Work with your health care provider to regularly review all of the medicines you are taking, including over-the-counter medicines. Ask your health care provider about any side effects that may make you more likely to have a fall. Tell your health care provider if any medicines that you take make you feel dizzy or sleepy.  Osteoporosis  screening. Osteoporosis is a condition that causes the bones to get weaker. This can make the bones weak and cause them to break more easily.  Blood pressure screening. Blood pressure changes and medicines to control blood pressure can make you feel dizzy.  Strength and balance checks. Your health care provider may recommend certain tests to check your strength and balance while standing, walking, or changing positions.  Foot health exam. Foot pain and numbness, as well as not wearing proper footwear, can make you more likely to have a fall.  Depression screening. You may be more likely to have a fall if you have a fear of falling, feel emotionally low, or feel unable to do activities that you used to do.  Alcohol use screening. Using too much alcohol can affect your balance and may make you more likely to have a fall. What actions can I take to lower my risk of falls? General instructions  Talk with your health care provider about your risks for falling. Tell your health care provider if: ? You fall. Be sure to tell your health care provider about all falls, even ones that seem minor. ? You feel dizzy, sleepy, or off-balance.  Take over-the-counter and prescription medicines only as told by your health care provider. These include any supplements.  Eat a healthy diet and maintain a healthy weight. A healthy diet includes low-fat dairy products, low-fat (lean) meats, and fiber from whole grains, beans, and lots  of fruits and vegetables. Home safety  Remove any tripping hazards, such as rugs, cords, and clutter.  Install safety equipment such as grab bars in bathrooms and safety rails on stairs.  Keep rooms and walkways well-lit. Activity   Follow a regular exercise program to stay fit. This will help you maintain your balance. Ask your health care provider what types of exercise are appropriate for you.  If you need a cane or walker, use it as recommended by your health care  provider.  Wear supportive shoes that have nonskid soles. Lifestyle  Do not drink alcohol if your health care provider tells you not to drink.  If you drink alcohol, limit how much you have: ? 0-1 drink a day for women. ? 0-2 drinks a day for men.  Be aware of how much alcohol is in your drink. In the [place] one drink equals one typical bottle of beer (12 oz), one-half glass of wine (5 oz), or one shot of hard liquor (1 oz).  Do not use any products that contain nicotine or tobacco, such as cigarettes and e-cigarettes. If you need help quitting, ask your health care provider. Summary  Having a healthy lifestyle and getting preventive care can help to protect your health and wellness after age 59.  Screening and testing are the best way to find a health problem early and help you avoid having a fall. Early diagnosis and treatment give you the best chance for managing medical conditions that are more common for people who are older than age 84.  Falls are a major cause of broken bones and head injuries in people who are older than age 47. Take precautions to prevent a fall at home.  Work with your health care provider to learn what changes you can make to improve your health and wellness and to prevent falls. This information is not intended to replace advice given to you by your health care provider. Make sure you discuss any questions you have with your health care provider. Document Revised: 12/04/2018 Document Reviewed: 06/26/2017 Elsevier Patient Education  2020 [employer].

## 2020-06-09 ENCOUNTER — Encounter: Payer: Self-pay | Admitting: Family Medicine

## 2020-06-09 LAB — COMPREHENSIVE METABOLIC PANEL
AG Ratio: 1.8 (calc) (ref 1.0–2.5)
ALT: 24 U/L (ref 6–29)
AST: 23 U/L (ref 10–35)
Albumin: 4.6 g/dL (ref 3.6–5.1)
Alkaline phosphatase (APISO): 58 U/L (ref 37–153)
BUN/Creatinine Ratio: 9 (calc) (ref 6–22)
BUN: 6 mg/dL — ABNORMAL LOW (ref 7–25)
CO2: 25 mmol/L (ref 20–32)
Calcium: 10 mg/dL (ref 8.6–10.4)
Chloride: 105 mmol/L (ref 98–110)
Creat: 0.65 mg/dL (ref 0.50–0.99)
Globulin: 2.5 g/dL (calc) (ref 1.9–3.7)
Glucose, Bld: 94 mg/dL (ref 65–99)
Potassium: 4.5 mmol/L (ref 3.5–5.3)
Sodium: 138 mmol/L (ref 135–146)
Total Bilirubin: 0.4 mg/dL (ref 0.2–1.2)
Total Protein: 7.1 g/dL (ref 6.1–8.1)

## 2020-06-09 LAB — CBC
HCT: 44.2 % (ref 35.0–45.0)
Hemoglobin: 15 g/dL (ref 11.7–15.5)
MCH: 31.6 pg (ref 27.0–33.0)
MCHC: 33.9 g/dL (ref 32.0–36.0)
MCV: 93.2 fL (ref 80.0–100.0)
MPV: 10.6 fL (ref 7.5–12.5)
Platelets: 378 10*3/uL (ref 140–400)
RBC: 4.74 10*6/uL (ref 3.80–5.10)
RDW: 12.5 % (ref 11.0–15.0)
WBC: 10.3 10*3/uL (ref 3.8–10.8)

## 2020-06-09 LAB — LIPID PANEL
Cholesterol: 175 mg/dL (ref <200)
HDL: 53 mg/dL (ref >=50)
LDL Cholesterol (Calc): 94 mg/dL (calc)
Non-HDL Cholesterol (Calc): 122 mg/dL (calc) (ref <130)
Total CHOL/HDL Ratio: 3.3 (calc) (ref <5.0)
Triglycerides: 185 mg/dL — ABNORMAL HIGH (ref <150)

## 2020-06-09 LAB — TSH: TSH: 2.06 mIU/L (ref 0.40–4.50)

## 2020-06-09 MED ORDER — LOVASTATIN 20 MG PO TABS: ORAL_TABLET | ORAL | 3 refills | Status: DC

## 2020-06-09 NOTE — Addendum Note (Signed)
Addended by: Lamar Blinks C on: 06/09/2020 05:55 AM   Modules accepted: Orders

## 2020-06-14 ENCOUNTER — Telehealth: Payer: Self-pay | Admitting: Family

## 2020-06-14 ENCOUNTER — Encounter: Payer: Self-pay | Admitting: Family

## 2020-06-14 ENCOUNTER — Inpatient Hospital Stay (HOSPITAL_BASED_OUTPATIENT_CLINIC_OR_DEPARTMENT_OTHER): Payer: Medicare Other | Admitting: Family

## 2020-06-14 ENCOUNTER — Ambulatory Visit (HOSPITAL_BASED_OUTPATIENT_CLINIC_OR_DEPARTMENT_OTHER)
Admission: RE | Admit: 2020-06-14 | Discharge: 2020-06-14 | Disposition: A | Discharge status: Home / Self Care | Payer: Medicare Other | Source: Ambulatory Visit | Attending: Family Medicine | Admitting: Family Medicine

## 2020-06-14 ENCOUNTER — Inpatient Hospital Stay: Payer: Medicare Other | Attending: Family

## 2020-06-14 ENCOUNTER — Other Ambulatory Visit: Payer: Self-pay

## 2020-06-14 VITALS — BP 146/82 | HR 77 | Temp 98.4°F | Resp 18 | Ht 68.0 in | Wt 215.0 lb

## 2020-06-14 DIAGNOSIS — Z87891 Personal history of nicotine dependence: Secondary | ICD-10-CM | POA: Insufficient documentation

## 2020-06-14 DIAGNOSIS — D72829 Elevated white blood cell count, unspecified: Secondary | ICD-10-CM | POA: Insufficient documentation

## 2020-06-14 DIAGNOSIS — F17200 Nicotine dependence, unspecified, uncomplicated: Secondary | ICD-10-CM | POA: Diagnosis not present

## 2020-06-14 DIAGNOSIS — I700 Atherosclerosis of aorta: Secondary | ICD-10-CM | POA: Diagnosis not present

## 2020-06-14 DIAGNOSIS — J432 Centrilobular emphysema: Secondary | ICD-10-CM | POA: Diagnosis not present

## 2020-06-14 DIAGNOSIS — F17210 Nicotine dependence, cigarettes, uncomplicated: Secondary | ICD-10-CM | POA: Diagnosis not present

## 2020-06-14 DIAGNOSIS — Z122 Encounter for screening for malignant neoplasm of respiratory organs: Secondary | ICD-10-CM | POA: Diagnosis not present

## 2020-06-14 DIAGNOSIS — D72823 Leukemoid reaction: Secondary | ICD-10-CM | POA: Diagnosis not present

## 2020-06-14 LAB — CBC WITH DIFFERENTIAL (CANCER CENTER ONLY)
Abs Immature Granulocytes: 0.04 10*3/uL (ref 0.00–0.07)
Basophils Absolute: 0.1 10*3/uL (ref 0.0–0.1)
Basophils Relative: 1 %
Eosinophils Absolute: 0.1 10*3/uL (ref 0.0–0.5)
Eosinophils Relative: 1 %
HCT: 43.2 % (ref 36.0–46.0)
Hemoglobin: 14.5 g/dL (ref 12.0–15.0)
Immature Granulocytes: 0 %
Lymphocytes Relative: 36 %
Lymphs Abs: 3.6 10*3/uL (ref 0.7–4.0)
MCH: 31.8 pg (ref 26.0–34.0)
MCHC: 33.6 g/dL (ref 30.0–36.0)
MCV: 94.7 fL (ref 80.0–100.0)
Monocytes Absolute: 0.7 10*3/uL (ref 0.1–1.0)
Monocytes Relative: 7 %
Neutro Abs: 5.6 10*3/uL (ref 1.7–7.7)
Neutrophils Relative %: 55 %
Platelet Count: 362 10*3/uL (ref 150–400)
RBC: 4.56 MIL/uL (ref 3.87–5.11)
RDW: 13.7 % (ref 11.5–15.5)
WBC Count: 10.1 10*3/uL (ref 4.0–10.5)
nRBC: 0 % (ref 0.0–0.2)

## 2020-06-14 LAB — SAVE SMEAR(SSMR), FOR PROVIDER SLIDE REVIEW

## 2020-06-14 LAB — LACTATE DEHYDROGENASE: LDH: 167 U/L (ref 98–192)

## 2020-06-14 NOTE — Telephone Encounter (Signed)
Per 10/19 los  Released, will follow-up as needed

## 2020-06-14 NOTE — Progress Notes (Signed)
Hematology and Oncology Follow Up Visit  Susan Cortez [id number] 1961/03/10 59 y.o. 06/14/2020   Principle Diagnosis:  Leukocytosis - reactive, smoker  Current Therapy:        Observation   Interim History:  Susan Cortez is here today for follow-up. She is doing well and has no complaints at this time.  She stays quite busy taking care of her grandson here as well as her mother up in [place].  No issue with infection. Her counts today are stable. WBC count is 10.1, platelets 362 and Hgb 14.5.  No fever, chills, n/v, cough, rash, dizziness, SOB, chest pain, palpitations, abdominal pain or changes in bowel or bladder habits.  No blood loss. No bruising or petechiae.  No swelling, tenderness, numbness or tingling in her extremities at this time.  No falls or syncope.  She has maintained a good appetite and is staying well hydrated. Her weight is stable.   ECOG Performance Status: 1 - Symptomatic but completely ambulatory  Medications:  Allergies as of 06/14/2020      Reactions   Chantix [varenicline] Other (See Comments)   Strange dreams/hallucinations   Diclofenac Swelling   Meloxicam Swelling   Butrans [buprenorphine] Itching   Itching and redness at the area of the patch.   Lyrica [pregabalin]    Headache,severe eye pain, swelling   Nsaids    Stomach pain   Tolmetin Other (See Comments)   Stomach pain      Medication List       Accurate as of June 14, 2020 11:25 AM. If you have any questions, ask your nurse or doctor.        acetaminophen-codeine 300-60 MG tablet Commonly known as: TYLENOL #4 Take 1 tablet by mouth daily.   CALCIUM 500 PO Take by mouth.   cholecalciferol 1000 units tablet Commonly known as: VITAMIN D Take 1,000 Units by mouth daily.   lovastatin 20 MG tablet Commonly known as: MEVACOR TAKE 1 TABLET(20 MG) BY MOUTH AT BEDTIME   Myrbetriq 50 MG Tb24 tablet Generic drug: mirabegron ER TAKE 1 TABLET BY MOUTH EVERY DAY     polyethylene glycol powder 17 GM/SCOOP powder Commonly known as: GLYCOLAX/MIRALAX Take 17 g 2 (two) times daily as needed by mouth. Use for constipation   Spiriva HandiHaler 18 MCG inhalation capsule Generic drug: tiotropium Place 1 capsule (18 mcg total) into inhaler and inhale daily.   triamcinolone cream 0.1 % Commonly known as: KENALOG APPLY TO AFFECTED AREA TWICE A DAY       Allergies:  Allergies  Allergen Reactions   Chantix [Varenicline] Other (See Comments)    Strange dreams/hallucinations   Diclofenac Swelling   Meloxicam Swelling   Butrans [Buprenorphine] Itching    Itching and redness at the area of the patch.   Lyrica [Pregabalin]     Headache,severe eye pain, swelling   Nsaids     Stomach pain   Tolmetin Other (See Comments)    Stomach pain    Past Medical History, Surgical history, Social history, and Family History were reviewed and updated.  Review of Systems: All other 10 point review of systems is negative.   Physical Exam:  vitals were not taken for this visit.   Wt Readings from Last 3 Encounters:  06/08/20 221 lb 9.6 oz (100.5 kg)  04/25/20 217 lb (98.4 kg)  03/10/20 215 lb 12.8 oz (97.9 kg)    Ocular: Sclerae unicteric, pupils equal, round and reactive to light Ear-nose-throat: Oropharynx clear, dentition fair  Lymphatic: No cervical or supraclavicular adenopathy Lungs no rales or rhonchi, good excursion bilaterally Heart regular rate and rhythm, no murmur appreciated Abd soft, nontender, positive bowel sounds MSK no focal spinal tenderness, no joint edema Neuro: non-focal, well-oriented, appropriate affect Breasts: Deferred   Lab Results  Component Value Date   WBC 10.1 06/14/2020   HGB 14.5 06/14/2020   HCT 43.2 06/14/2020   MCV 94.7 06/14/2020   PLT 362 06/14/2020   Lab Results  Component Value Date   FERRITIN 162 10/23/2019   IRON 109 10/23/2019   TIBC 393 10/23/2019   UIBC 284 10/23/2019   IRONPCTSAT 28  10/23/2019   Lab Results  Component Value Date   RBC 4.56 06/14/2020   No results found for: KPAFRELGTCHN, LAMBDASER, KAPLAMBRATIO No results found for: IGGSERUM, IGA, IGMSERUM No results found for: Odetta Pink, SPEI   Chemistry      Component Value Date/Time   NA 138 06/08/2020 1050   K 4.5 06/08/2020 1050   CL 105 06/08/2020 1050   CO2 25 06/08/2020 1050   BUN 6 (L) 06/08/2020 1050   CREATININE 0.65 06/08/2020 1050      Component Value Date/Time   CALCIUM 10.0 06/08/2020 1050   ALKPHOS 51 12/14/2019 0802   AST 23 06/08/2020 1050   AST 19 12/14/2019 0802   ALT 24 06/08/2020 1050   ALT 21 12/14/2019 0802   BILITOT 0.4 06/08/2020 1050   BILITOT 0.5 12/14/2019 0802       Impression and Plan: Susan Cortez is a very pleasant 59 yo caucasian female with history of reactive leukocytosis, smoking.  Her counts have remained stable and she is asymptomatic.  At this point we can let her go from our office and she can follow-up as needed.  She is in agreement with the plan and will contact our office with any questions or concerns.    Laverna Peace, NP 10/19/202111:25 AM

## 2020-06-16 ENCOUNTER — Telehealth: Payer: Self-pay | Admitting: Family Medicine

## 2020-06-16 NOTE — Telephone Encounter (Signed)
[facility] calling to make sure you have received CT Chest Lung Ca Screen along with the 3 impressions.

## 2020-06-16 NOTE — Telephone Encounter (Signed)
Provider has received report.

## 2020-06-17 ENCOUNTER — Telehealth: Payer: Self-pay | Admitting: Family Medicine

## 2020-06-17 DIAGNOSIS — R918 Other nonspecific abnormal finding of lung field: Secondary | ICD-10-CM

## 2020-06-17 NOTE — Telephone Encounter (Signed)
Called pt to discuss her recent chest CT findings.  She was already aware.  Will refer to pulmonology for likely bronch and tissue sampling.  She understands and agrees w plan, will call me if she does not hear in the next 1-2 weeks

## 2020-06-24 ENCOUNTER — Encounter: Payer: Self-pay | Admitting: Family Medicine

## 2020-06-27 DIAGNOSIS — D481 Neoplasm of uncertain behavior of connective and other soft tissue: Secondary | ICD-10-CM | POA: Diagnosis not present

## 2020-06-28 ENCOUNTER — Ambulatory Visit (INDEPENDENT_AMBULATORY_CARE_PROVIDER_SITE_OTHER): Payer: Medicare Other | Admitting: Emergency Medicine

## 2020-06-28 ENCOUNTER — Other Ambulatory Visit: Payer: Self-pay

## 2020-06-28 ENCOUNTER — Encounter: Payer: Self-pay | Admitting: Emergency Medicine

## 2020-06-28 VITALS — BP 120/70 | HR 110 | Temp 97.6°F | Ht 68.0 in | Wt 219.6 lb

## 2020-06-28 DIAGNOSIS — R911 Solitary pulmonary nodule: Secondary | ICD-10-CM | POA: Insufficient documentation

## 2020-06-28 NOTE — Addendum Note (Signed)
Addended by: Lia Foyer R on: 06/28/2020 03:58 PM   Modules accepted: Orders

## 2020-06-28 NOTE — Patient Instructions (Addendum)
We will arrange for a CT scan of your chest in late January 2022. Follow with Dr. Lamonte Sakai in January after your CT to review. We will perform pulmonary function testing at your next office visit Try to work on decreasing your smoking as you are able. We can talk about this more going forward.

## 2020-06-28 NOTE — Progress Notes (Signed)
Subjective:    Patient ID: Susan Cortez, female    DOB: 17-Jul-1961, 59 y.o.   MRN: [id number]  HPI 59 year old active smoker (40 pack years) with history of allergic rhinitis, osteoarthritis with cervical stenosis, prediabetes, COPD. She is referred today for abnormal CT scan of the chest.  She underwent a lung cancer screening CT on 06/16/2020 which I have reviewed, this shows an irregularly shaped somewhat branching nodular area in the posterior right upper lobe, 10 mm, without any associated airspace disease.  Question mucoid impaction versus nodule.  She reports that she has been feeling well. She does have chronic fatigue, chronic pain. She has poor sleep. She has exertional SOB when carrying groceries. Able to walk without trouble. She tried Spiriva without any benefit. No cough, no wheeze.  No PFT available at this time.    Review of Systems As per HPI  Past Medical History:  Diagnosis Date  . Allergy   . Arthritis   . Basal cell carcinoma 2018   Removed from Right neck per patient      Family History  Problem Relation Age of Onset  . Diabetes Father   . Heart disease Father   . Hyperlipidemia Father      Social History   Socioeconomic History  . Marital status: Divorced    Spouse name: Not on file  . Number of children: Not on file  . Years of education: Not on file  . Highest education level: Not on file  Occupational History  . Not on file  Tobacco Use  . Smoking status: Current Every Day Smoker    Packs/day: 1.00    Years: 40.00    Pack years: 40.00  . Smokeless tobacco: Never Used  Vaping Use  . Vaping Use: Former  Substance and Sexual Activity  . Alcohol use: No  . Drug use: No  . Sexual activity: Not on file  Other Topics Concern  . Not on file  Social History Narrative  . Not on file   Social Determinants of Health   Financial Resource Strain: Low Risk   . Difficulty of Paying Living Expenses: Not hard at all  Food Insecurity: No Food  Insecurity  . Worried About Charity fundraiser in the Last Year: Never true  . Ran Out of Food in the Last Year: Never true  Transportation Needs: No Transportation Needs  . Lack of Transportation (Medical): No  . Lack of Transportation (Non-Medical): No  Physical Activity:   . Days of Exercise per Week: Not on file  . Minutes of Exercise per Session: Not on file  Stress:   . Feeling of Stress : Not on file  Social Connections:   . Frequency of Communication with Friends and Family: Not on file  . Frequency of Social Gatherings with Friends and Family: Not on file  . Attends Religious Services: Not on file  . Active Member of Clubs or Organizations: Not on file  . Attends Archivist Meetings: Not on file  . Marital Status: Not on file  Intimate Partner Violence:   . Fear of Current or Ex-Partner: Not on file  . Emotionally Abused: Not on file  . Physically Abused: Not on file  . Sexually Abused: Not on file    Has lived in [place] Exposed to jet fumes No military   Allergies  Allergen Reactions  . Lyrica [Pregabalin] Swelling    Headache,severe eye pain, swelling  . Butrans [Buprenorphine] Itching  Itching and redness at the area of the patch.  . Chantix [Varenicline] Other (See Comments)    Strange dreams/hallucinations  . Diclofenac Swelling  . Meloxicam Swelling  . Tolmetin Other (See Comments)    Stomach pain  . Nsaids Other (See Comments)    Stomach pain     Outpatient Medications Prior to Visit  Medication Sig Dispense Refill  . acetaminophen-codeine (TYLENOL #4) 300-60 MG tablet Take 1 tablet by mouth daily. 30 tablet 2  . Calcium-Magnesium-Vitamin D (CALCIUM 500 PO) Take by mouth.    . cholecalciferol (VITAMIN D) 1000 units tablet Take 1,000 Units by mouth daily.    Marland Kitchen lovastatin (MEVACOR) 20 MG tablet TAKE 1 TABLET(20 MG) BY MOUTH AT BEDTIME 90 tablet 3  . MYRBETRIQ 50 MG TB24 tablet TAKE 1 TABLET BY MOUTH EVERY DAY 90 tablet 2  . polyethylene  glycol powder (GLYCOLAX/MIRALAX) powder Take 17 g 2 (two) times daily as needed by mouth. Use for constipation 3350 g 6  . tiotropium (SPIRIVA HANDIHALER) 18 MCG inhalation capsule Place 1 capsule (18 mcg total) into inhaler and inhale daily. 30 capsule 12  . triamcinolone cream (KENALOG) 0.1 % APPLY TO AFFECTED AREA TWICE A DAY     No facility-administered medications prior to visit.        Objective:   Physical Exam Vitals:   06/28/20 1514  BP: 120/70  Pulse: (!) 110  Temp: 97.6 F (36.4 C)  TempSrc: Temporal  SpO2: 97%  Weight: 219 lb 9.6 oz (99.6 kg)  Height: 5\' 8"  (1.727 m)   Gen: Pleasant, well-nourished, in no distress,  normal affect  ENT: No lesions,  mouth clear,  oropharynx clear, no postnasal drip  Neck: No JVD, no stridor  Lungs: No use of accessory muscles, no crackles or wheezing on normal respiration, no wheeze on forced expiration  Cardiovascular: RRR, heart sounds normal, no murmur or gallops, no peripheral edema  Musculoskeletal: No deformities, no cyanosis or clubbing  Neuro: alert, awake, non focal  Skin: Warm, no lesions or rash      Assessment & Plan:  Pulmonary nodule 1 cm or greater in diameter Small irregularly shaped almost linear posterior right upper lobe opacity.  Etiology unclear but question whether this could be vascular or mucoid impaction.  It is new compared with her CT 1 year prior.  Recommend follow-up CT in 3 months to assess for either resolution, interval change.  If it increases in size then would propose either diagnostic bronchoscopy or possibly even VATS resection depending on her pulmonary function testing.  We will arrange for a CT scan of your chest in late January 2022. Follow with Dr. Lamonte Sakai in January after your CT to review. We will perform pulmonary function testing at your next office visit Try to work on decreasing your smoking as you are able. We can talk about this more going forward.   Baltazar Apo, MD,  PhD 06/28/2020, 3:43 PM [facility] [phone] or if no answer [phone]

## 2020-06-28 NOTE — Assessment & Plan Note (Signed)
Small irregularly shaped almost linear posterior right upper lobe opacity.  Etiology unclear but question whether this could be vascular or mucoid impaction.  It is new compared with her CT 1 year prior.  Recommend follow-up CT in 3 months to assess for either resolution, interval change.  If it increases in size then would propose either diagnostic bronchoscopy or possibly even VATS resection depending on her pulmonary function testing.  We will arrange for a CT scan of your chest in late January 2022. Follow with Dr. Lamonte Sakai in January after your CT to review. We will perform pulmonary function testing at your next office visit Try to work on decreasing your smoking as you are able. We can talk about this more going forward.

## 2020-07-12 DIAGNOSIS — Z20822 Contact with and (suspected) exposure to covid-19: Secondary | ICD-10-CM | POA: Diagnosis not present

## 2020-07-19 ENCOUNTER — Encounter: Payer: Medicare Other | Attending: Registered Nurse | Admitting: Registered Nurse

## 2020-07-19 ENCOUNTER — Other Ambulatory Visit: Payer: Self-pay

## 2020-07-19 ENCOUNTER — Encounter: Payer: Self-pay | Admitting: Registered Nurse

## 2020-07-19 VITALS — Ht 68.0 in | Wt 219.0 lb

## 2020-07-19 DIAGNOSIS — M5412 Radiculopathy, cervical region: Secondary | ICD-10-CM

## 2020-07-19 DIAGNOSIS — M4802 Spinal stenosis, cervical region: Secondary | ICD-10-CM

## 2020-07-19 DIAGNOSIS — M545 Low back pain, unspecified: Secondary | ICD-10-CM

## 2020-07-19 DIAGNOSIS — M542 Cervicalgia: Secondary | ICD-10-CM

## 2020-07-19 DIAGNOSIS — M546 Pain in thoracic spine: Secondary | ICD-10-CM | POA: Diagnosis not present

## 2020-07-19 DIAGNOSIS — Z79891 Long term (current) use of opiate analgesic: Secondary | ICD-10-CM

## 2020-07-19 DIAGNOSIS — G8929 Other chronic pain: Secondary | ICD-10-CM

## 2020-07-19 DIAGNOSIS — Z5181 Encounter for therapeutic drug level monitoring: Secondary | ICD-10-CM

## 2020-07-19 DIAGNOSIS — M25511 Pain in right shoulder: Secondary | ICD-10-CM | POA: Diagnosis not present

## 2020-07-19 DIAGNOSIS — G894 Chronic pain syndrome: Secondary | ICD-10-CM

## 2020-07-19 MED ORDER — ACETAMINOPHEN-CODEINE #4 300-60 MG PO TABS: 1.0000 | ORAL_TABLET | Freq: Every day | ORAL | 2 refills | Status: DC

## 2020-07-19 NOTE — Progress Notes (Signed)
Subjective:    Patient ID: Susan Cortez, female    DOB: April 27, 1961, 59 y.o.   MRN: [id number]  HPI: Susan Cortez is a 59 y.o. female whose appointment was changed to a My-Chart Video visit, she is presently on quarantine she states. Also reports her daughter and grandson was diagnosed with COVID, emotional support given. [name] agrees with My- Chary video visit and verbalizes understanding. She states her pain is located in her neck radiating into her right shoulder and mid-  Back mainly right side and lower back pain. She rates her pain 7. Her  current exercise regime is walking and performing stretching exercises.  [name] Morphine equivalent is 9.00 MME.  Last UDS was performed on 01/11/2020, it was consistent.   Jasmine December CMA asked the health and history questions. This provider and Kennon Rounds verified we were speaking with the correct person using two identifiers.   Pain Inventory Average Pain 5 Pain Right Now 7 My pain is sharp, stabbing and aching  In the last 24 hours, has pain interfered with the following? General activity 0 Relation with others 0 Enjoyment of life 0 What TIME of day is your pain at its worst? morning , daytime, evening and night Sleep (in general) Poor  Pain is worse with: some activites Pain improves with: pacing activities and medication Relief from Meds: 7  Family History  Problem Relation Age of Onset  . Diabetes Father   . Heart disease Father   . Hyperlipidemia Father    Social History   Socioeconomic History  . Marital status: Divorced    Spouse name: Not on file  . Number of children: Not on file  . Years of education: Not on file  . Highest education level: Not on file  Occupational History  . Not on file  Tobacco Use  . Smoking status: Current Every Day Smoker    Packs/day: 1.00    Years: 40.00    Pack years: 40.00  . Smokeless tobacco: Never Used  Vaping Use  . Vaping Use: Former  Substance and Sexual  Activity  . Alcohol use: No  . Drug use: No  . Sexual activity: Not on file  Other Topics Concern  . Not on file  Social History Narrative  . Not on file   Social Determinants of Health   Financial Resource Strain: Low Risk   . Difficulty of Paying Living Expenses: Not hard at all  Food Insecurity: No Food Insecurity  . Worried About Charity fundraiser in the Last Year: Never true  . Ran Out of Food in the Last Year: Never true  Transportation Needs: No Transportation Needs  . Lack of Transportation (Medical): No  . Lack of Transportation (Non-Medical): No  Physical Activity:   . Days of Exercise per Week: Not on file  . Minutes of Exercise per Session: Not on file  Stress:   . Feeling of Stress : Not on file  Social Connections:   . Frequency of Communication with Friends and Family: Not on file  . Frequency of Social Gatherings with Friends and Family: Not on file  . Attends Religious Services: Not on file  . Active Member of Clubs or Organizations: Not on file  . Attends Archivist Meetings: Not on file  . Marital Status: Not on file   Past Surgical History:  Procedure Laterality Date  . CESAREAN SECTION     X 3  . DILATION AND CURETTAGE OF  UTERUS    . SHOULDER ARTHROSCOPY WITH OPEN ROTATOR CUFF REPAIR Right 05/2009  . TONSILLECTOMY  1977  . TUBAL LIGATION     Past Surgical History:  Procedure Laterality Date  . CESAREAN SECTION     X 3  . DILATION AND CURETTAGE OF UTERUS    . SHOULDER ARTHROSCOPY WITH OPEN ROTATOR CUFF REPAIR Right 05/2009  . TONSILLECTOMY  1977  . TUBAL LIGATION     Past Medical History:  Diagnosis Date  . Allergy   . Arthritis   . Basal cell carcinoma 2018   Removed from Right neck per patient    Ht 5\' 8"  (1.727 m)   Wt 219 lb (99.3 kg)   BMI 33.30 kg/m   Opioid Risk Score:   Fall Risk Score:  `1  Depression screen PHQ 2/9  Depression screen [facility] 2/9 03/10/2020 03/09/2019 12/29/2018 03/07/2018 01/03/2018 06/26/2017  09/05/2016  Decreased Interest 0 0 0 0 0 0 0  Down, Depressed, Hopeless 0 0 0 0 0 0 0  PHQ - 2 Score 0 0 0 0 0 0 0  Altered sleeping - - - - - - -  Tired, decreased energy - - - - - - -  Change in appetite - - - - - - -  Feeling bad or failure about yourself  - - - - - - -  Trouble concentrating - - - - - - -  Moving slowly or fidgety/restless - - - - - - -  Suicidal thoughts - - - - - - -  PHQ-9 Score - - - - - - -    Review of Systems  Musculoskeletal: Positive for back pain and neck pain.       Shoulder pain  All other systems reviewed and are negative.      Objective:   Physical Exam Vitals and nursing note reviewed.  Musculoskeletal:     Comments: No Physical Exam Performed: My-Chart Video Visit           Assessment & Plan:  1.Cervical spinal stenosis without evidence ofradiculopathy. Chronic neck pain: Refilled Tylenol #4 300/60mg  daily, #30.07/19/2020. We will continue the opioid monitoring program, this consists of regular clinic visits, examinations, urine drug screen, pill counts as well as use of [place] Controlled Substance Reporting system. A 12 month History has been reviewed on the [place] on 07/19/2020. 2. Right shoulder rotator cuff tear, status post repair with chronic pain and contracture : Continue with exercise regimeas tolerated.07/19/2020. 3. Insomnia:Continue Melatonin.07/09/2020. 4. Chronic Bilateral Lower Back Pain without Sciatica: Continue HEP as tolerated. Continue to Monitor.07/19/2020 5. Right Greater Trochanter Bursitis:No complaints Today.Continue to Alternate Ice and Heat Therapy. Continue to Monitor.07/19/2020 6. Chronic right Sided Thoracic Back Pain: Continue current medication regime. Continue HEP as tolerated. Continue to monitor.07/19/2020  My-Chart Video Visit Established Patient Location of Patient: In Her Home Location of Provider: In the Office   F/U 3 months

## 2020-08-01 ENCOUNTER — Telehealth: Payer: Self-pay | Admitting: Emergency Medicine

## 2020-08-03 NOTE — Telephone Encounter (Signed)
[email or web address]  to schedule this cxt Susan Cortez

## 2020-08-05 NOTE — Telephone Encounter (Signed)
Spoke to pt she is aware her ct is 09/20/19@[facility]  and I transferred her to main# to set up pft and ov with dr byrum Joellen [place]

## 2020-08-23 ENCOUNTER — Other Ambulatory Visit: Payer: Self-pay

## 2020-08-23 ENCOUNTER — Encounter: Payer: Self-pay | Admitting: Internal Medicine

## 2020-08-23 ENCOUNTER — Telehealth (INDEPENDENT_AMBULATORY_CARE_PROVIDER_SITE_OTHER): Payer: Medicare Other | Admitting: Internal Medicine

## 2020-08-23 VITALS — Ht 68.0 in | Wt 221.0 lb

## 2020-08-23 DIAGNOSIS — J069 Acute upper respiratory infection, unspecified: Secondary | ICD-10-CM | POA: Diagnosis not present

## 2020-08-23 NOTE — Progress Notes (Signed)
Subjective:    Patient ID: Susan Cortez, female    DOB: [birth year], 60 y.o.   MRN: [id number]  DOS:  08/23/2020 Type of visit - description: Virtual Visit via Video Note  I connected with the above patient  by a video enabled telemedicine application and verified that I am speaking with the correct person using two identifiers.   THIS ENCOUNTER IS A VIRTUAL VISIT DUE TO COVID-19 - PATIENT WAS NOT SEEN IN THE OFFICE. PATIENT HAS CONSENTED TO VIRTUAL VISIT / TELEMEDICINE VISIT   Location of patient: home  Location of provider: office  Persons participating in the virtual visit: patient, provider   I discussed the limitations of evaluation and management by telemedicine and the availability of in person appointments. The patient expressed understanding and agreed to proceed.  Acute Symptoms a started 08/19/2020: Sinus congestion, mild sore throat, some facial pain (points to the parotid area bilaterally, denies swelling or TTP). She had a mild subjective fever yesterday but not today.  She has a chronic cough, she smokes a pack a day. Chronic cough is at baseline. I see that she was prescribed before Spiriva but she denies a history of asthma or  emphysema.  Denies chest pain no difficulty breathing Very mild headache twice in the last few days. No rash No nasal discharge.    Review of Systems See above   Past Medical History:  Diagnosis Date  . Allergy   . Arthritis   . Basal cell carcinoma 2018   Removed from Right neck per patient     Past Surgical History:  Procedure Laterality Date  . CESAREAN SECTION     X 3  . DILATION AND CURETTAGE OF UTERUS    . SHOULDER ARTHROSCOPY WITH OPEN ROTATOR CUFF REPAIR Right 05/2009  . TONSILLECTOMY  1977  . TUBAL LIGATION      Allergies as of 08/23/2020      Reactions   Lyrica [pregabalin] Swelling   Headache,severe eye pain, swelling   Butrans [buprenorphine] Itching   Itching and redness at the area of the patch.    Chantix [varenicline] Other (See Comments)   Strange dreams/hallucinations   Diclofenac Swelling   Meloxicam Swelling   Tolmetin Other (See Comments)   Stomach pain   Nsaids Other (See Comments)   Stomach pain      Medication List       Accurate as of August 23, 2020 11:59 PM. If you have any questions, ask your nurse or doctor.        acetaminophen-codeine 300-60 MG tablet Commonly known as: TYLENOL #4 Take 1 tablet by mouth daily.   CALCIUM 500 PO Take by mouth.   cholecalciferol 1000 units tablet Commonly known as: VITAMIN D Take 1,000 Units by mouth daily.   lovastatin 20 MG tablet Commonly known as: MEVACOR TAKE 1 TABLET(20 MG) BY MOUTH AT BEDTIME   Myrbetriq 50 MG Tb24 tablet Generic drug: mirabegron ER TAKE 1 TABLET BY MOUTH EVERY DAY   polyethylene glycol powder 17 GM/SCOOP powder Commonly known as: GLYCOLAX/MIRALAX Take 17 g 2 (two) times daily as needed by mouth. Use for constipation   Spiriva HandiHaler 18 MCG inhalation capsule Generic drug: tiotropium Place 1 capsule (18 mcg total) into inhaler and inhale daily.   triamcinolone 0.1 % Commonly known as: KENALOG APPLY TO AFFECTED AREA TWICE A DAY          Objective:   Physical Exam Ht 5\' 8"  (1.727 m)   Wt 221 lb (  100.2 kg)   BMI 33.60 kg/m  This is a video visit, she is alert oriented x3, in no distress, no cough noted. No vital signs available.    Assessment     59 year old female, PMH include tobacco abuse, chronic pain, chronic cough, presents with  URI: Upper respiratory symptoms for 4 days, no red flags, had 2 Covid vaccinations before. DDx includes a URI, Covid, other viral infections. Recommend to be tested for Covid and let us know if positive. Otherwise treat conservatively with rest, fluids, Mucinex. I did stress the need to pay attention to her symptoms, if she gets worse she needs to let us know. I also recommended a Covid booster vaccine, she is strongly declined,  reports is feeling tired since she had the second Covid vaccine.    I discussed the assessment and treatment plan with the patient. The patient was provided an opportunity to ask questions and all were answered. The patient agreed with the plan and demonstrated an understanding of the instructions.   The patient was advised to call back or seek an in-person evaluation if the symptoms worsen or if the condition fails to improve as anticipated.

## 2020-08-25 ENCOUNTER — Telehealth: Payer: Self-pay | Admitting: Gastroenterology

## 2020-08-25 NOTE — Telephone Encounter (Addendum)
Hi Dr. Havery Moros,  We received a referral for patient to repeat colonoscopy. Patient had one done in 2013 obtain records for you to review.   Please advise on scheduling.   Thank you

## 2020-08-28 DIAGNOSIS — Z20822 Contact with and (suspected) exposure to covid-19: Secondary | ICD-10-CM | POA: Diagnosis not present

## 2020-08-30 ENCOUNTER — Encounter: Payer: Self-pay | Admitting: Gastroenterology

## 2020-08-30 NOTE — Progress Notes (Signed)
[place] at [facility] [address], [place], [place] [address] [phone]  Date:  08/31/2020   Name:  Susan Cortez   DOB:  02-02-1961   MRN:  [id number]  PCP:  Darreld Mclean, MD    Chief Complaint: No chief complaint on file.   History of Present Illness:  Susan Cortez is a 60 y.o. very pleasant female patient who presents with the following:  Virtual visit today for illness- History of cervical spinal stenosis, lower back pain, intermittent claudication, dyslipidemia, prediabetes   covid vaccine done but has not gotten booster  Pt location is home, my location is office Pt ID confirmed with 2 factors, the patient and myself are present on the call today  Connected with patient via video monitor today  Today pt notes sinus symptoms for more than 10 days She is leaving for [place] tomorrow- she will be driving She got sick on Christmas eve She has a cough but this is her baseline-long-term smoker She did have a fever she thinks last week- she is not 100% sure, her thermometer is broken No vomiting or diarrhea She does have some ST- no earache  She did get tested for covid and was negative She was tested this past Sunday   Patient Active Problem List   Diagnosis Date Noted  . Pulmonary nodule 1 cm or greater in diameter 06/28/2020  . Encounter for screening for lung cancer 01/04/2020  . Porokeratosis 12/02/2019  . Plantar flexed metatarsal bone of left foot 12/02/2019  . Plantar flexed metatarsal bone of right foot 12/02/2019  . Prediabetes 05/31/2019  . Dyslipidemia 03/01/2018  . De Quervain's disease (tenosynovitis) 03/28/2017  . Chronic bilateral low back pain without sciatica 03/28/2017  . Sebaceous hyperplasia of face 06/26/2016  . Chronic right shoulder pain 01/02/2016  . Adenoma of left adrenal gland 12/26/2015  . Intermittent claudication ([place]) 12/26/2015  . Rotator cuff (capsule) sprain 03/26/2014  .  Spinal stenosis in cervical region 03/26/2014  . Chronic pain 03/26/2014    Past Medical History:  Diagnosis Date  . Allergy   . Arthritis   . Basal cell carcinoma 2018   Removed from Right neck per patient     Past Surgical History:  Procedure Laterality Date  . CESAREAN SECTION     X 3  . DILATION AND CURETTAGE OF UTERUS    . SHOULDER ARTHROSCOPY WITH OPEN ROTATOR CUFF REPAIR Right 05/2009  . TONSILLECTOMY  1977  . TUBAL LIGATION      Social History   Tobacco Use  . Smoking status: Current Every Day Smoker    Packs/day: 1.00    Years: 40.00    Pack years: 40.00  . Smokeless tobacco: Never Used  Vaping Use  . Vaping Use: Former  Substance Use Topics  . Alcohol use: No  . Drug use: No    Family History  Problem Relation Age of Onset  . Diabetes Father   . Heart disease Father   . Hyperlipidemia Father     Allergies  Allergen Reactions  . Lyrica [Pregabalin] Swelling    Headache,severe eye pain, swelling  . Butrans [Buprenorphine] Itching    Itching and redness at the area of the patch.  . Chantix [Varenicline] Other (See Comments)    Strange dreams/hallucinations  . Diclofenac Swelling  . Meloxicam Swelling  . Tolmetin Other (See Comments)    Stomach pain  . Nsaids Other (See Comments)  Stomach pain    Medication list has been reviewed and updated.  Current Outpatient Medications on File Prior to Visit  Medication Sig Dispense Refill  . acetaminophen-codeine (TYLENOL #4) 300-60 MG tablet Take 1 tablet by mouth daily. 30 tablet 2  . Calcium-Magnesium-Vitamin D (CALCIUM 500 PO) Take by mouth.    . cholecalciferol (VITAMIN D) 1000 units tablet Take 1,000 Units by mouth daily.    Marland Kitchen lovastatin (MEVACOR) 20 MG tablet TAKE 1 TABLET(20 MG) BY MOUTH AT BEDTIME 90 tablet 3  . MYRBETRIQ 50 MG TB24 tablet TAKE 1 TABLET BY MOUTH EVERY DAY 90 tablet 2  . polyethylene glycol powder (GLYCOLAX/MIRALAX) powder Take 17 g 2 (two) times daily as needed by mouth. Use  for constipation 3350 g 6  . tiotropium (SPIRIVA HANDIHALER) 18 MCG inhalation capsule Place 1 capsule (18 mcg total) into inhaler and inhale daily. (Patient not taking: Reported on 08/23/2020) 30 capsule 12  . triamcinolone cream (KENALOG) 0.1 % APPLY TO AFFECTED AREA TWICE A DAY     No current facility-administered medications on file prior to visit.    Review of Systems:  As per HPI- otherwise negative.   Physical Examination: There were no vitals filed for this visit. There were no vitals filed for this visit. There is no height or weight on file to calculate BMI. Ideal Body Weight:    Pt observed via video- she looks well  She does not have a working thermometer  Not checking other vital signs at this time No cough, wheezing, distress is noted  Assessment and Plan: Acute recurrent frontal sinusitis - Plan: amoxicillin (AMOXIL) 500 MG capsule  Virtual visit today for concern of sinus infection.  The patient notes sinus fullness, pressure and discomfort for more than 10 days.  She has been tested for COVID-19 and was negative.  We will treat with amoxicillin twice a day for 10 days, I have asked her let me know if not feeling better soon Video used for the entirety of visit today  Signed Lamar Blinks, MD

## 2020-08-31 ENCOUNTER — Other Ambulatory Visit: Payer: Self-pay

## 2020-08-31 ENCOUNTER — Telehealth (INDEPENDENT_AMBULATORY_CARE_PROVIDER_SITE_OTHER): Payer: Medicare Other | Admitting: Family Medicine

## 2020-08-31 DIAGNOSIS — J0111 Acute recurrent frontal sinusitis: Secondary | ICD-10-CM

## 2020-08-31 MED ORDER — AMOXICILLIN 500 MG PO CAPS: 1000.0000 mg | ORAL_CAPSULE | Freq: Two times a day (BID) | ORAL | 0 refills | Status: DC

## 2020-09-19 ENCOUNTER — Other Ambulatory Visit: Payer: Medicare Other

## 2020-09-21 DIAGNOSIS — Z20822 Contact with and (suspected) exposure to covid-19: Secondary | ICD-10-CM | POA: Diagnosis not present

## 2020-09-25 ENCOUNTER — Encounter (HOSPITAL_BASED_OUTPATIENT_CLINIC_OR_DEPARTMENT_OTHER): Payer: Self-pay | Admitting: *Deleted

## 2020-09-25 ENCOUNTER — Emergency Department (HOSPITAL_BASED_OUTPATIENT_CLINIC_OR_DEPARTMENT_OTHER): Payer: Medicare Other

## 2020-09-25 ENCOUNTER — Emergency Department (HOSPITAL_BASED_OUTPATIENT_CLINIC_OR_DEPARTMENT_OTHER)
Admission: EM | Admit: 2020-09-25 | Discharge: 2020-09-25 | Disposition: A | Discharge status: Home / Self Care | Payer: Medicare Other | Attending: Emergency Medicine | Admitting: Emergency Medicine

## 2020-09-25 ENCOUNTER — Other Ambulatory Visit: Payer: Self-pay

## 2020-09-25 DIAGNOSIS — Z85828 Personal history of other malignant neoplasm of skin: Secondary | ICD-10-CM | POA: Diagnosis not present

## 2020-09-25 DIAGNOSIS — J069 Acute upper respiratory infection, unspecified: Secondary | ICD-10-CM | POA: Diagnosis not present

## 2020-09-25 DIAGNOSIS — F17210 Nicotine dependence, cigarettes, uncomplicated: Secondary | ICD-10-CM | POA: Diagnosis not present

## 2020-09-25 DIAGNOSIS — R0602 Shortness of breath: Secondary | ICD-10-CM | POA: Diagnosis not present

## 2020-09-25 DIAGNOSIS — R509 Fever, unspecified: Secondary | ICD-10-CM | POA: Diagnosis not present

## 2020-09-25 DIAGNOSIS — Z20828 Contact with and (suspected) exposure to other viral communicable diseases: Secondary | ICD-10-CM

## 2020-09-25 DIAGNOSIS — R059 Cough, unspecified: Secondary | ICD-10-CM | POA: Diagnosis not present

## 2020-09-25 DIAGNOSIS — J029 Acute pharyngitis, unspecified: Secondary | ICD-10-CM | POA: Diagnosis not present

## 2020-09-25 MED ORDER — PROMETHAZINE-DM 6.25-15 MG/5ML PO SYRP
5.0000 mL | ORAL_SOLUTION | Freq: Four times a day (QID) | ORAL | 0 refills | Status: DC | PRN
Start: 2020-09-25 — End: 2020-10-26

## 2020-09-25 MED ORDER — PREDNISONE 20 MG PO TABS
40.0000 mg | ORAL_TABLET | Freq: Every day | ORAL | 0 refills | Status: AC
Start: 2020-09-25 — End: 2020-09-30

## 2020-09-25 MED ORDER — BENZONATATE 100 MG PO CAPS
100.0000 mg | ORAL_CAPSULE | Freq: Three times a day (TID) | ORAL | 0 refills | Status: DC
Start: 2020-09-25 — End: 2020-10-13

## 2020-09-25 NOTE — ED Provider Notes (Signed)
[facility] EMERGENCY DEPARTMENT Provider Note   CSN: [id number] Arrival date & time: 09/25/20  1325     History Chief Complaint  Patient presents with  . Cough    Susan Cortez is a 60 y.o. female presenting for evaluation of cough, fever, nasal congestion.  Patient states the past week, she has had persistent symptoms.  She has multiple family members who have similar symptoms, they all had a family gathering 2 weeks ago.  Multiple family members have tested positive for RSV since symptoms began, everyone has tested negative for Covid.  Patient tested 3 days ago for Covid, was negative at that time.  Patient states that she continues to have symptoms, is recommended she come to the ER for RSV testing.  She is taking Tylenol and ibuprofen for fever, is not taking any medication.  She reports cough is nonproductive, is not worsening, but also not improving.  She denies significant shortness of breath, chest pain, nausea, vomiting, abd, urinary symptoms, abnormal bowel movements.  She denies lung history including COPD or asthma.  HPI     Past Medical History:  Diagnosis Date  . Allergy   . Arthritis   . Basal cell carcinoma 2018   Removed from Right neck per patient     Patient Active Problem List   Diagnosis Date Noted  . Pulmonary nodule 1 cm or greater in diameter 06/28/2020  . Encounter for screening for lung cancer 01/04/2020  . Porokeratosis 12/02/2019  . Plantar flexed metatarsal bone of left foot 12/02/2019  . Plantar flexed metatarsal bone of right foot 12/02/2019  . Prediabetes 05/31/2019  . Dyslipidemia 03/01/2018  . De Quervain's disease (tenosynovitis) 03/28/2017  . Chronic bilateral low back pain without sciatica 03/28/2017  . Sebaceous hyperplasia of face 06/26/2016  . Chronic right shoulder pain 01/02/2016  . Adenoma of left adrenal gland 12/26/2015  . Intermittent claudication ([place]) 12/26/2015  . Rotator cuff (capsule) sprain 03/26/2014  .  Spinal stenosis in cervical region 03/26/2014  . Chronic pain 03/26/2014    Past Surgical History:  Procedure Laterality Date  . CESAREAN SECTION     X 3  . DILATION AND CURETTAGE OF UTERUS    . SHOULDER ARTHROSCOPY WITH OPEN ROTATOR CUFF REPAIR Right 05/2009  . TONSILLECTOMY  1977  . TUBAL LIGATION       OB History    Gravida  4   Para  3   Term      Preterm      AB  1   Living  3     SAB  1   IAB      Ectopic      Multiple      Live Births  3           Family History  Problem Relation Age of Onset  . Diabetes Father   . Heart disease Father   . Hyperlipidemia Father     Social History   Tobacco Use  . Smoking status: Current Every Day Smoker    Packs/day: 1.00    Years: 40.00    Pack years: 40.00    Types: Cigarettes  . Smokeless tobacco: Never Used  Vaping Use  . Vaping Use: Former  Substance Use Topics  . Alcohol use: No  . Drug use: No    Home Medications Prior to Admission medications   Medication Sig Start Date End Date Taking? Authorizing Provider  benzonatate (TESSALON) 100 MG capsule Take 1 capsule (100  mg total) by mouth every 8 (eight) hours. 09/25/20  Yes Artavia Jeanlouis, PA-C  predniSONE (DELTASONE) 20 MG tablet Take 2 tablets (40 mg total) by mouth daily for 5 days. 09/25/20 09/30/20 Yes Wells Mabe, PA-C  promethazine-dextromethorphan (PROMETHAZINE-DM) 6.25-15 MG/5ML syrup Take 5 mLs by mouth 4 (four) times daily as needed for cough. 09/25/20  Yes Hermann Dottavio, PA-C  acetaminophen-codeine (TYLENOL #4) 300-60 MG tablet Take 1 tablet by mouth daily. 07/19/20   Bayard Hugger, NP  amoxicillin (AMOXIL) 500 MG capsule Take 2 capsules (1,000 mg total) by mouth 2 (two) times daily. 08/31/20   Copland, Gay Filler, MD  Calcium-Magnesium-Vitamin D (CALCIUM 500 PO) Take by mouth.    [provider]  cholecalciferol (VITAMIN D) 1000 units tablet Take 1,000 Units by mouth daily.    [provider]  lovastatin  (MEVACOR) 20 MG tablet TAKE 1 TABLET(20 MG) BY MOUTH AT BEDTIME 06/09/20   Copland, Gay Filler, MD  MYRBETRIQ 50 MG TB24 tablet TAKE 1 TABLET BY MOUTH EVERY DAY 02/23/20   Copland, Gay Filler, MD  polyethylene glycol powder (GLYCOLAX/MIRALAX) powder Take 17 g 2 (two) times daily as needed by mouth. Use for constipation 07/10/17   Copland, Gay Filler, MD  tiotropium (SPIRIVA HANDIHALER) 18 MCG inhalation capsule Place 1 capsule (18 mcg total) into inhaler and inhale daily. Patient not taking: Reported on 08/23/2020 08/10/19   Copland, Gay Filler, MD  triamcinolone cream (KENALOG) 0.1 % APPLY TO AFFECTED AREA TWICE A DAY 08/29/19   [provider]    Allergies    Lyrica [pregabalin], Butrans [buprenorphine], Chantix [varenicline], Diclofenac, Meloxicam, Tolmetin, and Nsaids  Review of Systems   Review of Systems  Constitutional: Positive for fever.  HENT: Positive for congestion and sore throat.   Respiratory: Positive for cough.   All other systems reviewed and are negative.   Physical Exam Updated Vital Signs BP 113/71 (BP Location: Right Arm)   Pulse 78   Temp 98.3 F (36.8 C) (Oral)   Resp 18   Ht 5' 8.5" (1.74 m)   Wt 99.8 kg   SpO2 100%   BMI 32.96 kg/m   Physical Exam Vitals and nursing note reviewed.  Constitutional:      General: She is not in acute distress.    Appearance: She is well-developed and well-nourished.     Comments: Resting in the bed in NAD  HENT:     Head: Normocephalic and atraumatic.  Eyes:     Extraocular Movements: Extraocular movements intact and EOM normal.     Conjunctiva/sclera: Conjunctivae normal.     Pupils: Pupils are equal, round, and reactive to light.  Cardiovascular:     Rate and Rhythm: Normal rate and regular rhythm.     Pulses: Normal pulses and intact distal pulses.  Pulmonary:     Effort: Pulmonary effort is normal. No respiratory distress.     Breath sounds: Normal breath sounds. No wheezing.     Comments: Speaking full  sentences.  Clear lung sounds in all fields.  Sats stable on room air. Abdominal:     General: There is no distension.     Palpations: Abdomen is soft. There is no mass.     Tenderness: There is no abdominal tenderness. There is no guarding or rebound.  Musculoskeletal:        General: Normal range of motion.     Cervical back: Normal range of motion and neck supple.     Right lower leg: No edema.  Left lower leg: No edema.  Skin:    General: Skin is warm and dry.     Capillary Refill: Capillary refill takes less than 2 seconds.  Neurological:     Mental Status: She is alert and oriented to person, place, and time.  Psychiatric:        Mood and Affect: Mood and affect normal.     ED Results / Procedures / Treatments   Labs (all labs ordered are listed, but only abnormal results are displayed) Labs Reviewed - No data to display  EKG None  Radiology DG Chest 2 View  Result Date: 09/25/2020 CLINICAL DATA:  Cough, shortness of breath, sore throat and fever for 1 week. EXAM: CHEST - 2 VIEW COMPARISON:  Chest x-ray dated 08/18/2018. FINDINGS: Heart size and mediastinal contours are within normal limits. Coarse lung markings are seen bilaterally indicating some degree of chronic interstitial lung disease/fibrosis. No confluent opacity to suggest a developing pneumonia. No pleural effusion or pneumothorax is seen. Osseous structures about the chest are unremarkable. IMPRESSION: 1. No active cardiopulmonary disease. No evidence of pneumonia or pulmonary edema. 2. Evidence of chronic interstitial lung disease/fibrosis. 3. Chest CT dated 06/14/2020 described a suspicious finding within the RIGHT upper lobe with differential considerations of mucoid impaction versus neoplastic process. Please see detailed description and recommendations on chest CT report of 06/14/2020. Electronically Signed   By: Franki Cabot M.D.   On: 09/25/2020 15:49    Procedures Procedures   Medications Ordered in  ED Medications - No data to display  ED Course  I have reviewed the triage vital signs and the nursing notes.  Pertinent labs & imaging results that were available during my care of the patient were reviewed by me and considered in my medical decision making (see chart for details).    MDM Rules/Calculators/A&P                          Patient presenting for evaluation of 1 week history of URI symptoms.  On exam, patient appears nontoxic.  Patient has had multiple family members who have tested positive for RSV, all negative for Covid.  As such, her symptoms are likely due to RSV.  However, she has had a week of symptoms including fevers, will check chest x-ray to ensure no secondary bacterial infection.  Will make sure patient is able to ambulate without hypoxia.  Otherwise, will treat for viral illness with symptomatic management.  Chest x-ray viewed interpreted by me, no pneumonia pneumothorax or effusion.  Patient ambulated without hypoxia.  At this time, patient appears safe for discharge.  Return precautions given.  Patient states she understands and agrees plan.  Final Clinical Impression(s) / ED Diagnoses Final diagnoses:  Viral URI with cough  Exposure to respiratory syncytial virus (RSV)    Rx / DC Orders ED Discharge Orders         Ordered    predniSONE (DELTASONE) 20 MG tablet  Daily        09/25/20 1610    benzonatate (TESSALON) 100 MG capsule  Every 8 hours        09/25/20 1610    promethazine-dextromethorphan (PROMETHAZINE-DM) 6.25-15 MG/5ML syrup  4 times daily PRN        09/25/20 1610           Reese Senk, PA-C 09/25/20 1657    Malvin Johns, MD 09/25/20 [facility]

## 2020-09-25 NOTE — ED Triage Notes (Signed)
Pt reports cough, fever, sore throat x 1 week. Had negative covid test on Wednesday. She reports exposure to RSV and states her mother's doctor recommended that she be tested for it

## 2020-09-25 NOTE — Progress Notes (Signed)
Ambulating pulse ox performed with pt. Pre SpO2 on room air 99% HR 75. Pt in no distress. Pt ambulated for 2 minutes and SpO2 on room air 98% HR 104. Pt with no complaints at this time and no distress noted. HR quickly declined back to baseline once pt sat back down. Pt tolerated well. RT will continue to monitor and be available as needed.

## 2020-09-25 NOTE — Discharge Instructions (Signed)
Take prednisone to help with inflammation. Use cough drops and syrup to help with cough control. Make sure stay well-hydrated water. Continue taking Tylenol ibuprofen as needed for fever, headache, body aches. Follow-up with your primary care doctor next week as needed for recheck of your symptoms. Return to the emergency room if you develop persistent chest pain, increased difficulty breathing, or any new, worsening, concerning symptoms.

## 2020-09-26 NOTE — Progress Notes (Unsigned)
[place] [facility] [address], [place], [place] [address] [phone]  Date:  09/28/2020   Name:  Susan Cortez   DOB:  [birth year]   MRN:  [id number]  PCP:  Darreld Mclean, MD    Chief Complaint: No chief complaint on file.   History of Present Illness:  Susan Cortez is a 60 y.o. very pleasant female patient who presents with the following:  Virtual visit today for illness- connected with pt via  Pt location is home, provider is at office Pt ID confirmed with 2 factors, she gives consent for virtual visit today  Seen in the ER on 1/30 with viral illness She has been negative for covid, but several family members with RSV recently  She was treated with prednisone and tessalon, cough syrup  Her mother and brother were actually admitted with RSV!   Pt notes that she is feeling better-  Temp 100.6 this am- no fever reducers taken  Her energy level is "same as usual" Breathing is ok- she is not in distress   Pulse ox 97% Pulse 81 BP 139/87  We think this all started with her 6yo grand-nephew who had RSV The cough syrup is working well for her, she does not need a RF at this time   No results found.   Patient Active Problem List   Diagnosis Date Noted  . Pulmonary nodule 1 cm or greater in diameter 06/28/2020  . Encounter for screening for lung cancer 01/04/2020  . Porokeratosis 12/02/2019  . Plantar flexed metatarsal bone of left foot 12/02/2019  . Plantar flexed metatarsal bone of right foot 12/02/2019  . Prediabetes 05/31/2019  . Dyslipidemia 03/01/2018  . De Quervain's disease (tenosynovitis) 03/28/2017  . Chronic bilateral low back pain without sciatica 03/28/2017  . Sebaceous hyperplasia of face 06/26/2016  . Chronic right shoulder pain 01/02/2016  . Adenoma of left adrenal gland 12/26/2015  . Intermittent claudication ([place]) 12/26/2015  . Rotator cuff (capsule) sprain 03/26/2014  . Spinal  stenosis in cervical region 03/26/2014  . Chronic pain 03/26/2014    Past Medical History:  Diagnosis Date  . Allergy   . Arthritis   . Basal cell carcinoma 2018   Removed from Right neck per patient     Past Surgical History:  Procedure Laterality Date  . CESAREAN SECTION     X 3  . DILATION AND CURETTAGE OF UTERUS    . SHOULDER ARTHROSCOPY WITH OPEN ROTATOR CUFF REPAIR Right 05/2009  . TONSILLECTOMY  1977  . TUBAL LIGATION      Social History   Tobacco Use  . Smoking status: Current Every Day Smoker    Packs/day: 1.00    Years: 40.00    Pack years: 40.00    Types: Cigarettes  . Smokeless tobacco: Never Used  Vaping Use  . Vaping Use: Former  Substance Use Topics  . Alcohol use: No  . Drug use: No    Family History  Problem Relation Age of Onset  . Diabetes Father   . Heart disease Father   . Hyperlipidemia Father     Allergies  Allergen Reactions  . Lyrica [Pregabalin] Swelling    Headache,severe eye pain, swelling  . Butrans [Buprenorphine] Itching    Itching and redness at the area of the patch.  . Chantix [Varenicline] Other (See Comments)    Strange dreams/hallucinations  . Diclofenac Swelling  . Meloxicam Swelling  . Tolmetin  Other (See Comments)    Stomach pain  . Nsaids Other (See Comments)    Stomach pain    Medication list has been reviewed and updated.  Current Outpatient Medications on File Prior to Visit  Medication Sig Dispense Refill  . acetaminophen-codeine (TYLENOL #4) 300-60 MG tablet Take 1 tablet by mouth daily. 30 tablet 2  . amoxicillin (AMOXIL) 500 MG capsule Take 2 capsules (1,000 mg total) by mouth 2 (two) times daily. 40 capsule 0  . benzonatate (TESSALON) 100 MG capsule Take 1 capsule (100 mg total) by mouth every 8 (eight) hours. 21 capsule 0  . Calcium-Magnesium-Vitamin D (CALCIUM 500 PO) Take by mouth.    . cholecalciferol (VITAMIN D) 1000 units tablet Take 1,000 Units by mouth daily.    Marland Kitchen lovastatin (MEVACOR) 20 MG  tablet TAKE 1 TABLET(20 MG) BY MOUTH AT BEDTIME 90 tablet 3  . MYRBETRIQ 50 MG TB24 tablet TAKE 1 TABLET BY MOUTH EVERY DAY 90 tablet 2  . polyethylene glycol powder (GLYCOLAX/MIRALAX) powder Take 17 g 2 (two) times daily as needed by mouth. Use for constipation 3350 g 6  . predniSONE (DELTASONE) 20 MG tablet Take 2 tablets (40 mg total) by mouth daily for 5 days. 10 tablet 0  . promethazine-dextromethorphan (PROMETHAZINE-DM) 6.25-15 MG/5ML syrup Take 5 mLs by mouth 4 (four) times daily as needed for cough. 118 mL 0  . tiotropium (SPIRIVA HANDIHALER) 18 MCG inhalation capsule Place 1 capsule (18 mcg total) into inhaler and inhale daily. (Patient not taking: Reported on 08/23/2020) 30 capsule 12  . triamcinolone cream (KENALOG) 0.1 % APPLY TO AFFECTED AREA TWICE A DAY     No current facility-administered medications on file prior to visit.    Review of Systems:  As per HPI- otherwise negative.   Physical Examination: There were no vitals filed for this visit. There were no vitals filed for this visit. There is no height or weight on file to calculate BMI. Ideal Body Weight:     Spoke with pt via video monitor Looks well, no cough or distress noted   Assessment and Plan: RSV (respiratory syncytial virus infection)  Virtual visit today to discuss recent RSV infection Seen in ER, she is stable and being treated with steroids No distress, she feels like she is getting better Continue quarantine until AF for 24 hours Call me if getting worse or any other concerns  Video used for entirely of visit today   Signed Lamar Blinks, MD

## 2020-09-28 ENCOUNTER — Encounter: Payer: Self-pay | Admitting: Family Medicine

## 2020-09-28 ENCOUNTER — Other Ambulatory Visit: Payer: Self-pay

## 2020-09-28 ENCOUNTER — Telehealth (INDEPENDENT_AMBULATORY_CARE_PROVIDER_SITE_OTHER): Payer: Medicare Other | Admitting: Family Medicine

## 2020-09-28 VITALS — BP 139/87 | HR 81 | Temp 100.6°F

## 2020-09-28 DIAGNOSIS — B974 Respiratory syncytial virus as the cause of diseases classified elsewhere: Secondary | ICD-10-CM | POA: Diagnosis not present

## 2020-09-28 DIAGNOSIS — B338 Other specified viral diseases: Secondary | ICD-10-CM

## 2020-10-05 ENCOUNTER — Ambulatory Visit (INDEPENDENT_AMBULATORY_CARE_PROVIDER_SITE_OTHER)
Admission: RE | Admit: 2020-10-05 | Discharge: 2020-10-05 | Disposition: A | Discharge status: Home / Self Care | Payer: Medicare Other | Source: Ambulatory Visit | Attending: Emergency Medicine | Admitting: Emergency Medicine

## 2020-10-05 ENCOUNTER — Other Ambulatory Visit: Payer: Self-pay

## 2020-10-05 DIAGNOSIS — J984 Other disorders of lung: Secondary | ICD-10-CM | POA: Diagnosis not present

## 2020-10-05 DIAGNOSIS — J439 Emphysema, unspecified: Secondary | ICD-10-CM | POA: Diagnosis not present

## 2020-10-05 DIAGNOSIS — R911 Solitary pulmonary nodule: Secondary | ICD-10-CM

## 2020-10-05 DIAGNOSIS — I700 Atherosclerosis of aorta: Secondary | ICD-10-CM | POA: Diagnosis not present

## 2020-10-05 DIAGNOSIS — I2510 Atherosclerotic heart disease of native coronary artery without angina pectoris: Secondary | ICD-10-CM | POA: Diagnosis not present

## 2020-10-10 ENCOUNTER — Ambulatory Visit (AMBULATORY_SURGERY_CENTER): Payer: Self-pay | Admitting: *Deleted

## 2020-10-10 ENCOUNTER — Other Ambulatory Visit: Payer: Self-pay

## 2020-10-10 VITALS — Ht 68.0 in | Wt 221.0 lb

## 2020-10-10 DIAGNOSIS — Z86010 Personal history of colonic polyps: Secondary | ICD-10-CM

## 2020-10-10 MED ORDER — PEG 3350-KCL-NA BICARB-NACL 420 G PO SOLR: 4000.0000 mL | Freq: Once | ORAL | 0 refills | Status: AC

## 2020-10-10 NOTE — Progress Notes (Signed)
Patient is here in-person for PV. Patient denies any allergies to eggs or soy. Patient denies any problems with anesthesia/sedation. Patient denies any oxygen use at home. Patient denies taking any diet/weight loss medications or blood thinners. Patient is not being treated for MRSA or C-diff. Patient is aware of our care-partner policy and [facility]-75 safety protocol. EMMI education assigned to the patient for the procedure, sent to [facility].   COVID-19 vaccines completed x2, per patient.   Patient is going to see pulmonologist Wednesday she will let him know she is having the colon and she'll let us know if he wants her to cx and do further testing.

## 2020-10-11 ENCOUNTER — Other Ambulatory Visit (HOSPITAL_COMMUNITY)
Admission: RE | Admit: 2020-10-11 | Discharge: 2020-10-11 | Disposition: A | Discharge status: Home / Self Care | Payer: Medicare Other | Source: Ambulatory Visit | Attending: Emergency Medicine | Admitting: Emergency Medicine

## 2020-10-11 DIAGNOSIS — Z01812 Encounter for preprocedural laboratory examination: Secondary | ICD-10-CM | POA: Diagnosis not present

## 2020-10-11 DIAGNOSIS — Z20822 Contact with and (suspected) exposure to covid-19: Secondary | ICD-10-CM | POA: Insufficient documentation

## 2020-10-11 LAB — SARS CORONAVIRUS 2 (TAT 6-24 HRS): SARS Coronavirus 2: NEGATIVE

## 2020-10-12 ENCOUNTER — Encounter: Payer: Self-pay | Admitting: Emergency Medicine

## 2020-10-12 ENCOUNTER — Other Ambulatory Visit: Payer: Self-pay

## 2020-10-12 ENCOUNTER — Ambulatory Visit (INDEPENDENT_AMBULATORY_CARE_PROVIDER_SITE_OTHER): Payer: Medicare Other | Admitting: Emergency Medicine

## 2020-10-12 VITALS — BP 126/74 | HR 96 | Temp 97.6°F | Ht 68.5 in | Wt 223.0 lb

## 2020-10-12 DIAGNOSIS — R911 Solitary pulmonary nodule: Secondary | ICD-10-CM

## 2020-10-12 LAB — PULMONARY FUNCTION TEST
DL/VA % pred: 82 %
DL/VA: 3.36 ml/min/mmHg/L
DLCO cor % pred: 75 %
DLCO cor: 17.67 ml/min/mmHg
DLCO unc % pred: 75 %
DLCO unc: 17.67 ml/min/mmHg
FEF 25-75 Post: 2.72 L/sec
FEF 25-75 Pre: 2.4 L/sec
FEF2575-%Change-Post: 13 %
FEF2575-%Pred-Post: 105 %
FEF2575-%Pred-Pre: 93 %
FEV1-%Change-Post: 2 %
FEV1-%Pred-Post: 93 %
FEV1-%Pred-Pre: 91 %
FEV1-Post: 2.79 L
FEV1-Pre: 2.71 L
FEV1FVC-%Change-Post: 1 %
FEV1FVC-%Pred-Pre: 99 %
FEV6-%Change-Post: 1 %
FEV6-%Pred-Post: 94 %
FEV6-%Pred-Pre: 93 %
FEV6-Post: 3.53 L
FEV6-Pre: 3.48 L
FEV6FVC-%Change-Post: 0 %
FEV6FVC-%Pred-Post: 103 %
FEV6FVC-%Pred-Pre: 103 %
FVC-%Change-Post: 1 %
FVC-%Pred-Post: 92 %
FVC-%Pred-Pre: 90 %
FVC-Post: 3.55 L
FVC-Pre: 3.49 L
Post FEV1/FVC ratio: 79 %
Post FEV6/FVC ratio: 99 %
Pre FEV1/FVC ratio: 78 %
Pre FEV6/FVC Ratio: 100 %
RV % pred: 107 %
RV: 2.41 L
TLC % pred: 102 %
TLC: 5.84 L

## 2020-10-12 NOTE — Assessment & Plan Note (Signed)
.  Persistent and actually enlarged on her most recent CT.  Now associated with enlarging mediastinal adenopathy.  Etiology unclear.  Do not think referral for primary resection is appropriate since she has the adenopathy.  Her pulmonary function testing would be suitable for resection if that becomes indicated going forward.  We will try to set up EBUS, ENB in the near future.

## 2020-10-12 NOTE — H&P (View-Only) (Signed)
   Subjective:    Patient ID: Susan Cortez, female    DOB: Nov 22, 1960, 60 y.o.   MRN: [id number]  HPI 60 year old active smoker (40 pack years) with history of allergic rhinitis, osteoarthritis with cervical stenosis, prediabetes, COPD. She is referred today for abnormal CT scan of the chest.  She underwent a lung cancer screening CT on 06/16/2020 which I have reviewed, this shows an irregularly shaped somewhat branching nodular area in the posterior right upper lobe, 10 mm, without any associated airspace disease.  Question mucoid impaction versus nodule.  She reports that she has been feeling well. She does have chronic fatigue, chronic pain. She has poor sleep. She has exertional SOB when carrying groceries. Able to walk without trouble. She tried Spiriva without any benefit. No cough, no wheeze.  No PFT available at this time.   60 year old smoker with allergic rhinitis, OA, prediabetes, carries a diagnosis of COPD.  I saw her in November for a irregularly shaped pulmonary nodule, linear opacity seen on screening CT scan. No new issues reported. Still smoking a pack a day. She has CSY scheduled for 10/24/20  CT chest 10/05/2020 reviewed by me shows some stable emphysematous change, slight increase in size and a branching nodular lesion in the right upper lobe suspicious for endobronchial tumor.  Also progression of mediastinal lymphadenopathy with a 1.8 cm precarinal node, right paratracheal node, some right hilar adenopathy  Pulmonary function testing done today reviewed by me, shows grossly normal airflows without a bronchodilator response, normal lung volumes, decreased diffusion capacity that corrects to the normal range when adjusted for alveolar volume   Review of Systems As per HPI      Objective:   Physical Exam Vitals:   10/12/20 1606  BP: 126/74  Pulse: 96  Temp: 97.6 F (36.4 C)  TempSrc: Temporal  SpO2: 97%  Weight: 223 lb (101.2 kg)  Height: 5' 8.5" (1.74 m)   Gen:  Pleasant, well-nourished, in no distress,  normal affect  ENT: No lesions,  mouth clear,  oropharynx clear, no postnasal drip  Neck: No JVD, no stridor  Lungs: No use of accessory muscles, no crackles or wheezing on normal respiration, no wheeze on forced expiration  Cardiovascular: RRR, heart sounds normal, no murmur or gallops, no peripheral edema  Musculoskeletal: No deformities, no cyanosis or clubbing  Neuro: alert, awake, non focal  Skin: Warm, no lesions or rash      Assessment & Plan:  Pulmonary nodule 1 cm or greater in diameter .Persistent and actually enlarged on her most recent CT.  Now associated with enlarging mediastinal adenopathy.  Etiology unclear.  Do not think referral for primary resection is appropriate since she has the adenopathy.  Her pulmonary function testing would be suitable for resection if that becomes indicated going forward.  We will try to set up EBUS, ENB in the near future.  Baltazar Apo, MD, PhD 10/12/2020, 4:50 PM [facility] [phone] or if no answer [phone]

## 2020-10-12 NOTE — Patient Instructions (Signed)
We will work on setting up bronchoscopy with endobronchial ultrasound, navigation and biopsies to better evaluate your pulmonary nodule and enlarged lymph nodes.  We will try to get this set up for 10/18/2020.  You will be called with scheduling details. Follow with Dr Lamonte Sakai in 1 month

## 2020-10-12 NOTE — Progress Notes (Signed)
PFT done today. 

## 2020-10-12 NOTE — Progress Notes (Signed)
   Subjective:    Patient ID: Susan Cortez, female    DOB: 1960-12-04, 60 y.o.   MRN: [id number]  HPI 60 year old active smoker (40 pack years) with history of allergic rhinitis, osteoarthritis with cervical stenosis, prediabetes, COPD. She is referred today for abnormal CT scan of the chest.  She underwent a lung cancer screening CT on 06/16/2020 which I have reviewed, this shows an irregularly shaped somewhat branching nodular area in the posterior right upper lobe, 10 mm, without any associated airspace disease.  Question mucoid impaction versus nodule.  She reports that she has been feeling well. She does have chronic fatigue, chronic pain. She has poor sleep. She has exertional SOB when carrying groceries. Able to walk without trouble. She tried Spiriva without any benefit. No cough, no wheeze.  No PFT available at this time.   60 year old smoker with allergic rhinitis, OA, prediabetes, carries a diagnosis of COPD.  I saw her in November for a irregularly shaped pulmonary nodule, linear opacity seen on screening CT scan. No new issues reported. Still smoking a pack a day. She has CSY scheduled for 10/24/20  CT chest 10/05/2020 reviewed by me shows some stable emphysematous change, slight increase in size and a branching nodular lesion in the right upper lobe suspicious for endobronchial tumor.  Also progression of mediastinal lymphadenopathy with a 1.8 cm precarinal node, right paratracheal node, some right hilar adenopathy  Pulmonary function testing done today reviewed by me, shows grossly normal airflows without a bronchodilator response, normal lung volumes, decreased diffusion capacity that corrects to the normal range when adjusted for alveolar volume   Review of Systems As per HPI      Objective:   Physical Exam Vitals:   10/12/20 1606  BP: 126/74  Pulse: 96  Temp: 97.6 F (36.4 C)  TempSrc: Temporal  SpO2: 97%  Weight: 223 lb (101.2 kg)  Height: 5' 8.5" (1.74 m)   Gen:  Pleasant, well-nourished, in no distress,  normal affect  ENT: No lesions,  mouth clear,  oropharynx clear, no postnasal drip  Neck: No JVD, no stridor  Lungs: No use of accessory muscles, no crackles or wheezing on normal respiration, no wheeze on forced expiration  Cardiovascular: RRR, heart sounds normal, no murmur or gallops, no peripheral edema  Musculoskeletal: No deformities, no cyanosis or clubbing  Neuro: alert, awake, non focal  Skin: Warm, no lesions or rash      Assessment & Plan:  Pulmonary nodule 1 cm or greater in diameter .Persistent and actually enlarged on her most recent CT.  Now associated with enlarging mediastinal adenopathy.  Etiology unclear.  Do not think referral for primary resection is appropriate since she has the adenopathy.  Her pulmonary function testing would be suitable for resection if that becomes indicated going forward.  We will try to set up EBUS, ENB in the near future.  Baltazar Apo, MD, PhD 10/12/2020, 4:50 PM [facility] [phone] or if no answer [phone]

## 2020-10-14 ENCOUNTER — Encounter: Payer: Self-pay | Admitting: Registered Nurse

## 2020-10-14 ENCOUNTER — Other Ambulatory Visit: Payer: Self-pay

## 2020-10-14 ENCOUNTER — Telehealth: Payer: Self-pay | Admitting: Emergency Medicine

## 2020-10-14 ENCOUNTER — Encounter: Payer: Medicare Other | Attending: Registered Nurse | Admitting: Registered Nurse

## 2020-10-14 VITALS — BP 98/54 | HR 87 | Temp 98.7°F | Ht 68.5 in | Wt 223.4 lb

## 2020-10-14 DIAGNOSIS — Z79891 Long term (current) use of opiate analgesic: Secondary | ICD-10-CM | POA: Insufficient documentation

## 2020-10-14 DIAGNOSIS — M546 Pain in thoracic spine: Secondary | ICD-10-CM | POA: Diagnosis not present

## 2020-10-14 DIAGNOSIS — M4802 Spinal stenosis, cervical region: Secondary | ICD-10-CM | POA: Diagnosis not present

## 2020-10-14 DIAGNOSIS — G8929 Other chronic pain: Secondary | ICD-10-CM | POA: Insufficient documentation

## 2020-10-14 DIAGNOSIS — Z5181 Encounter for therapeutic drug level monitoring: Secondary | ICD-10-CM | POA: Diagnosis not present

## 2020-10-14 DIAGNOSIS — M5412 Radiculopathy, cervical region: Secondary | ICD-10-CM | POA: Insufficient documentation

## 2020-10-14 DIAGNOSIS — M25511 Pain in right shoulder: Secondary | ICD-10-CM | POA: Diagnosis not present

## 2020-10-14 DIAGNOSIS — G894 Chronic pain syndrome: Secondary | ICD-10-CM | POA: Insufficient documentation

## 2020-10-14 DIAGNOSIS — M542 Cervicalgia: Secondary | ICD-10-CM | POA: Diagnosis not present

## 2020-10-14 MED ORDER — ACETAMINOPHEN-CODEINE #4 300-60 MG PO TABS: 1.0000 | ORAL_TABLET | Freq: Every day | ORAL | 2 refills | Status: DC

## 2020-10-14 NOTE — Telephone Encounter (Signed)
The following has been scheduled for the patient & patient has been made aware: COVID TEST 2/19 @ 11:30 ENB/EBUS 222 @ 9:15  CT Dis to by [facility] to [facility].

## 2020-10-14 NOTE — Progress Notes (Signed)
Subjective:    Patient ID: Susan Cortez, female    DOB: [birth year], 60 y.o.   MRN: [id number]  HPI: Susan Cortez is a 60 y.o. female who returns for follow up appointment for chronic pain and medication refill. She states her pain is located in her neck radiating into her right shoulder and upper-back mainly right side. She rates her pain 6. Her current exercise regime is walking and performing stretching exercises.  [name] Morphine equivalent is 9.00 MME.  Last UDS was on 01/11/2020, it was consistent.   Pain Inventory Average Pain 7 Pain Right Now 6 My pain is burning, dull, stabbing and aching  In the last 24 hours, has pain interfered with the following? General activity 1 Relation with others 0 Enjoyment of life 1 What TIME of day is your pain at its worst? daytime and evening Sleep (in general) Fair  Pain is worse with: walking, standing and some activites Pain improves with: rest, pacing activities and medication Relief from Meds: 8  Family History  Problem Relation Age of Onset  . Diabetes Father   . Heart disease Father   . Hyperlipidemia Father   . Colon cancer Neg Hx   . Colon polyps Neg Hx   . Esophageal cancer Neg Hx   . Rectal cancer Neg Hx   . Stomach cancer Neg Hx    Social History   Socioeconomic History  . Marital status: Divorced    Spouse name: Not on file  . Number of children: Not on file  . Years of education: Not on file  . Highest education level: Not on file  Occupational History  . Not on file  Tobacco Use  . Smoking status: Current Every Day Smoker    Packs/day: 1.00    Years: 45.00    Pack years: 45.00    Types: Cigarettes  . Smokeless tobacco: Never Used  . Tobacco comment: 1 packed smoked a day 2/162022 [facility]   Vaping Use  . Vaping Use: Former  Substance and Sexual Activity  . Alcohol use: No  . Drug use: No  . Sexual activity: Not on file  Other Topics Concern  . Not on file  Social History Narrative  . Not on  file   Social Determinants of Health   Financial Resource Strain: Low Risk   . Difficulty of Paying Living Expenses: Not hard at all  Food Insecurity: No Food Insecurity  . Worried About Charity fundraiser in the Last Year: Never true  . Ran Out of Food in the Last Year: Never true  Transportation Needs: No Transportation Needs  . Lack of Transportation (Medical): No  . Lack of Transportation (Non-Medical): No  Physical Activity: Not on file  Stress: Not on file  Social Connections: Not on file   Past Surgical History:  Procedure Laterality Date  . CESAREAN SECTION     X 3  . COLONOSCOPY  2013, 2016, 2019   hx colon polyps  . DILATION AND CURETTAGE OF UTERUS    . SHOULDER ARTHROSCOPY WITH OPEN ROTATOR CUFF REPAIR Right 05/2009  . TONSILLECTOMY  1977  . TUBAL LIGATION     Past Surgical History:  Procedure Laterality Date  . CESAREAN SECTION     X 3  . COLONOSCOPY  2013, 2016, 2019   hx colon polyps  . DILATION AND CURETTAGE OF UTERUS    . SHOULDER ARTHROSCOPY WITH OPEN ROTATOR CUFF REPAIR Right 05/2009  . TONSILLECTOMY  1977  .  TUBAL LIGATION     Past Medical History:  Diagnosis Date  . Allergy   . Arthritis   . Basal cell carcinoma 2018   Removed from Right neck per patient    BP (!) 98/54   Pulse 87   Temp 98.7 F (37.1 C)   Ht 5' 8.5" (1.74 m)   Wt 223 lb 6.4 oz (101.3 kg)   SpO2 95%   BMI 33.47 kg/m   Opioid Risk Score:   Fall Risk Score:  `1  Depression screen PHQ 2/9  Depression screen [facility] 2/9 08/23/2020 03/10/2020 03/09/2019 12/29/2018 03/07/2018 01/03/2018 06/26/2017  Decreased Interest 0 0 0 0 0 0 0  Down, Depressed, Hopeless 0 0 0 0 0 0 0  PHQ - 2 Score 0 0 0 0 0 0 0  Altered sleeping - - - - - - -  Tired, decreased energy - - - - - - -  Change in appetite - - - - - - -  Feeling bad or failure about yourself  - - - - - - -  Trouble concentrating - - - - - - -  Moving slowly or fidgety/restless - - - - - - -  Suicidal thoughts - - - - - - -   PHQ-9 Score - - - - - - -     Review of Systems  Musculoskeletal: Positive for neck pain.       Shoulder pain  All other systems reviewed and are negative.      Objective:   Physical Exam Vitals and nursing note reviewed.  Constitutional:      Appearance: Normal appearance.  Neck:     Comments: Cervical Paraspinal Tenderness: C-5_6 Cardiovascular:     Rate and Rhythm: Normal rate and regular rhythm.     Pulses: Normal pulses.     Heart sounds: Normal heart sounds.  Pulmonary:     Effort: Pulmonary effort is normal.     Breath sounds: Normal breath sounds.  Musculoskeletal:     Cervical back: Normal range of motion and neck supple.     Comments: Normal Muscle Bulk and Muscle Testing Reveals:  Upper Extremities: Right Upper Extremity: Decreased ROM 45 Degrees and Muscle Strength 5/5 Right AC Joint Tenderness Left Upper Extremity: Full ROM and Muscle Strength 5/5 Thoracic Paraspinal Tenderness: T-1-T-7 Mainly Right Side Lower Extremities: Full ROM and Muscle Strength 5/5 Arises from Table with ease Narrow Based  Gait   Skin:    General: Skin is warm and dry.  Neurological:     Mental Status: She is alert and oriented to person, place, and time.  Psychiatric:        Mood and Affect: Mood normal.        Behavior: Behavior normal.           Assessment & Plan:  1.Cervical spinal stenosis without evidence ofradiculopathy. Chronic neck pain: Refilled Tylenol #4 300/60mg  daily, #30.10/14/2020. We will continue the opioid monitoring program, this consists of regular clinic visits, examinations, urine drug screen, pill counts as well as use of [place] Controlled Substance Reporting system. A 12 month History has been reviewed on the [place] Controlled Substance Reporting Systemon 10/14/2020. 2. Right shoulder rotator cuff tear, status post repair with chronic pain and contracture : Continue with exercise regimeas tolerated.10/14/2020. 3.  Insomnia:Continue Melatonin.10/14/2020 4. Chronic Bilateral Lower Back Pain without Sciatica: No complaints today. Continue HEP as tolerated. Continue to Monitor.10/14/2020 5. Right Greater Trochanter Bursitis:No complaints Today.Continue to Alternate  Ice and Heat Therapy. Continue to Monitor.10/14/2020 6. Chronic right Sided Thoracic Back Pain: Continue current medication regime. Continue HEP as tolerated. Continue to monitor.10/14/2020  F/U in  1 month

## 2020-10-15 ENCOUNTER — Other Ambulatory Visit (HOSPITAL_COMMUNITY)
Admission: RE | Admit: 2020-10-15 | Discharge: 2020-10-15 | Disposition: A | Discharge status: Home / Self Care | Payer: Medicare Other | Source: Ambulatory Visit | Attending: Emergency Medicine | Admitting: Emergency Medicine

## 2020-10-15 DIAGNOSIS — Z01812 Encounter for preprocedural laboratory examination: Secondary | ICD-10-CM | POA: Insufficient documentation

## 2020-10-15 DIAGNOSIS — Z20822 Contact with and (suspected) exposure to covid-19: Secondary | ICD-10-CM | POA: Insufficient documentation

## 2020-10-15 LAB — SARS CORONAVIRUS 2 (TAT 6-24 HRS): SARS Coronavirus 2: NEGATIVE

## 2020-10-17 ENCOUNTER — Other Ambulatory Visit: Payer: Self-pay

## 2020-10-17 ENCOUNTER — Encounter (HOSPITAL_COMMUNITY): Payer: Self-pay | Admitting: Emergency Medicine

## 2020-10-17 NOTE — Progress Notes (Signed)
Patient denies shortness of breath, fever, cough or chest pain.  PCP -Dr Silvestre Mesi Cardiologist - n/a  Chest x-ray - CT Super Chest 10/05/20; 09/25/20 EKG - n/a Stress Test - n/a ECHO - n/a Cardiac Cath - n/a  STOP now taking any Aspirin (unless otherwise instructed by your surgeon), Aleve, Naproxen, Ibuprofen, Motrin, Advil, Goody's, BC's, all herbal medications, fish oil, and all vitamins.   Coronavirus Screening Covid test on 10/15/20 was negative.  Patient verbalized understanding of instructions that were given via phone.

## 2020-10-18 ENCOUNTER — Other Ambulatory Visit: Payer: Self-pay

## 2020-10-18 ENCOUNTER — Ambulatory Visit (HOSPITAL_COMMUNITY): Payer: Medicare Other

## 2020-10-18 ENCOUNTER — Encounter: Payer: Medicare Other | Admitting: Registered Nurse

## 2020-10-18 ENCOUNTER — Ambulatory Visit (HOSPITAL_COMMUNITY)
Admission: RE | Admit: 2020-10-18 | Discharge: 2020-10-18 | Disposition: A | Discharge status: Home / Self Care | Payer: Medicare Other | Attending: Emergency Medicine | Admitting: Emergency Medicine

## 2020-10-18 ENCOUNTER — Encounter (HOSPITAL_COMMUNITY)
Admission: RE | Disposition: A | Discharge status: Home / Self Care | Payer: Self-pay | Source: Home / Self Care | Attending: Emergency Medicine

## 2020-10-18 ENCOUNTER — Ambulatory Visit (HOSPITAL_COMMUNITY): Payer: Medicare Other | Admitting: Anesthesiology

## 2020-10-18 ENCOUNTER — Encounter (HOSPITAL_COMMUNITY): Payer: Self-pay | Admitting: Emergency Medicine

## 2020-10-18 DIAGNOSIS — R590 Localized enlarged lymph nodes: Secondary | ICD-10-CM | POA: Diagnosis not present

## 2020-10-18 DIAGNOSIS — C801 Malignant (primary) neoplasm, unspecified: Secondary | ICD-10-CM | POA: Diagnosis not present

## 2020-10-18 DIAGNOSIS — Z419 Encounter for procedure for purposes other than remedying health state, unspecified: Secondary | ICD-10-CM

## 2020-10-18 DIAGNOSIS — Z9889 Other specified postprocedural states: Secondary | ICD-10-CM

## 2020-10-18 DIAGNOSIS — C3411 Malignant neoplasm of upper lobe, right bronchus or lung: Secondary | ICD-10-CM | POA: Diagnosis not present

## 2020-10-18 DIAGNOSIS — F17210 Nicotine dependence, cigarettes, uncomplicated: Secondary | ICD-10-CM | POA: Diagnosis not present

## 2020-10-18 DIAGNOSIS — R911 Solitary pulmonary nodule: Secondary | ICD-10-CM | POA: Diagnosis present

## 2020-10-18 DIAGNOSIS — C771 Secondary and unspecified malignant neoplasm of intrathoracic lymph nodes: Secondary | ICD-10-CM | POA: Diagnosis not present

## 2020-10-18 DIAGNOSIS — E785 Hyperlipidemia, unspecified: Secondary | ICD-10-CM | POA: Diagnosis not present

## 2020-10-18 DIAGNOSIS — R918 Other nonspecific abnormal finding of lung field: Secondary | ICD-10-CM | POA: Diagnosis not present

## 2020-10-18 DIAGNOSIS — R7303 Prediabetes: Secondary | ICD-10-CM | POA: Diagnosis not present

## 2020-10-18 DIAGNOSIS — D3500 Benign neoplasm of unspecified adrenal gland: Secondary | ICD-10-CM | POA: Diagnosis not present

## 2020-10-18 DIAGNOSIS — R896 Abnormal cytological findings in specimens from other organs, systems and tissues: Secondary | ICD-10-CM | POA: Diagnosis not present

## 2020-10-18 HISTORY — DX: Hyperlipidemia, unspecified: E78.5

## 2020-10-18 HISTORY — DX: Overactive bladder: N32.81

## 2020-10-18 HISTORY — PX: VIDEO BRONCHOSCOPY WITH ENDOBRONCHIAL NAVIGATION: SHX6175

## 2020-10-18 HISTORY — DX: Prediabetes: R73.03

## 2020-10-18 HISTORY — PX: FUDUCIAL PLACEMENT: SHX5083

## 2020-10-18 HISTORY — PX: VIDEO BRONCHOSCOPY WITH ENDOBRONCHIAL ULTRASOUND: SHX6177

## 2020-10-18 LAB — POCT I-STAT, CHEM 8
BUN: 8 mg/dL (ref 8–23)
Calcium, Ion: 1.21 mmol/L (ref 1.15–1.40)
Chloride: 109 mmol/L (ref 98–111)
Creatinine, Ser: 0.6 mg/dL (ref 0.44–1.00)
Glucose, Bld: 110 mg/dL — ABNORMAL HIGH (ref 70–99)
HCT: 46 % (ref 36.0–46.0)
Hemoglobin: 15.6 g/dL — ABNORMAL HIGH (ref 12.0–15.0)
Potassium: 4.1 mmol/L (ref 3.5–5.1)
Sodium: 141 mmol/L (ref 135–145)
TCO2: 22 mmol/L (ref 22–32)

## 2020-10-18 SURGERY — VIDEO BRONCHOSCOPY WITH ENDOBRONCHIAL NAVIGATION
Anesthesia: General | Site: Chest | Laterality: Right

## 2020-10-18 MED ORDER — FENTANYL CITRATE (PF) 100 MCG/2ML IJ SOLN: 25.0000 ug | INTRAMUSCULAR | Status: DC | PRN

## 2020-10-18 MED ORDER — LIDOCAINE HCL (CARDIAC) PF 100 MG/5ML IV SOSY
PREFILLED_SYRINGE | INTRAVENOUS | Status: DC | PRN
  Administered 2020-10-18: 30 mg via INTRAVENOUS

## 2020-10-18 MED ORDER — PROPOFOL 10 MG/ML IV BOLUS
INTRAVENOUS | Status: DC | PRN
  Administered 2020-10-18: 150 mg via INTRAVENOUS

## 2020-10-18 MED ORDER — PROPOFOL 10 MG/ML IV BOLUS
INTRAVENOUS | Status: AC
  Filled 2020-10-18: qty 40

## 2020-10-18 MED ORDER — FENTANYL CITRATE (PF) 100 MCG/2ML IJ SOLN
INTRAMUSCULAR | Status: DC | PRN
  Administered 2020-10-18: 150 ug via INTRAVENOUS

## 2020-10-18 MED ORDER — EPINEPHRINE PF 1 MG/ML IJ SOLN
INTRAMUSCULAR | Status: AC
  Filled 2020-10-18: qty 1

## 2020-10-18 MED ORDER — ONDANSETRON HCL 4 MG/2ML IJ SOLN
INTRAMUSCULAR | Status: DC | PRN
  Administered 2020-10-18: 4 mg via INTRAVENOUS

## 2020-10-18 MED ORDER — SUGAMMADEX SODIUM 200 MG/2ML IV SOLN
INTRAVENOUS | Status: DC | PRN
  Administered 2020-10-18: 200 mg via INTRAVENOUS

## 2020-10-18 MED ORDER — MIDAZOLAM HCL 2 MG/2ML IJ SOLN
INTRAMUSCULAR | Status: AC
  Filled 2020-10-18: qty 2

## 2020-10-18 MED ORDER — ACETAMINOPHEN 500 MG PO TABS
ORAL_TABLET | ORAL | Status: AC
  Filled 2020-10-18: qty 2

## 2020-10-18 MED ORDER — FENTANYL CITRATE (PF) 250 MCG/5ML IJ SOLN
INTRAMUSCULAR | Status: AC
  Filled 2020-10-18: qty 5

## 2020-10-18 MED ORDER — DEXAMETHASONE SODIUM PHOSPHATE 10 MG/ML IJ SOLN
INTRAMUSCULAR | Status: DC | PRN
  Administered 2020-10-18: 10 mg via INTRAVENOUS

## 2020-10-18 MED ORDER — ACETAMINOPHEN 500 MG PO TABS: 1000.0000 mg | ORAL_TABLET | Freq: Once | ORAL | Status: DC

## 2020-10-18 MED ORDER — PHENYLEPHRINE HCL (PRESSORS) 10 MG/ML IV SOLN
INTRAVENOUS | Status: DC | PRN
  Administered 2020-10-18 (×2): 100 ug via INTRAVENOUS

## 2020-10-18 MED ORDER — ROCURONIUM BROMIDE 100 MG/10ML IV SOLN
INTRAVENOUS | Status: DC | PRN
  Administered 2020-10-18: 50 mg via INTRAVENOUS
  Administered 2020-10-18: 10 mg via INTRAVENOUS

## 2020-10-18 MED ORDER — 0.9 % SODIUM CHLORIDE (POUR BTL) OPTIME
TOPICAL | Status: DC | PRN
  Administered 2020-10-18: 1000 mL

## 2020-10-18 MED ORDER — CHLORHEXIDINE GLUCONATE 0.12 % MT SOLN
15.0000 mL | Freq: Once | OROMUCOSAL | Status: AC
  Administered 2020-10-18: 15 mL via OROMUCOSAL
  Filled 2020-10-18: qty 15

## 2020-10-18 MED ORDER — MIDAZOLAM HCL 5 MG/5ML IJ SOLN
INTRAMUSCULAR | Status: DC | PRN
  Administered 2020-10-18: 2 mg via INTRAVENOUS

## 2020-10-18 MED ORDER — ORAL CARE MOUTH RINSE: 15.0000 mL | Freq: Once | OROMUCOSAL | Status: AC

## 2020-10-18 MED ORDER — LACTATED RINGERS IV SOLN: INTRAVENOUS | Status: DC

## 2020-10-18 MED ORDER — CELECOXIB 200 MG PO CAPS
ORAL_CAPSULE | ORAL | Status: AC
  Filled 2020-10-18: qty 1

## 2020-10-18 SURGICAL SUPPLY — 47 items
ADAPTER BRONCHOSCOPE OLYMPUS (ADAPTER) ×3 IMPLANT
ADAPTER VALVE BIOPSY EBUS (MISCELLANEOUS) IMPLANT
ADPTR VALVE BIOPSY EBUS (MISCELLANEOUS)
BRUSH CYTOL CELLEBRITY 1.5X140 (MISCELLANEOUS) ×3 IMPLANT
BRUSH SUPERTRAX BIOPSY (INSTRUMENTS) IMPLANT
BRUSH SUPERTRAX NDL-TIP CYTO (INSTRUMENTS) ×6 IMPLANT
CANISTER SUCT 3000ML PPV (MISCELLANEOUS) ×3 IMPLANT
CNTNR URN SCR LID CUP LEK RST (MISCELLANEOUS) ×2 IMPLANT
CONT SPEC 4OZ STRL OR WHT (MISCELLANEOUS) ×1
COVER BACK TABLE 60X90IN (DRAPES) ×3 IMPLANT
COVER WAND RF STERILE (DRAPES) ×3 IMPLANT
FILTER STRAW FLUID ASPIR (MISCELLANEOUS) IMPLANT
FORCEPS BIOP 1.5 SINGLE USE (MISCELLANEOUS) ×3 IMPLANT
FORCEPS BIOP RJ4 1.8 (CUTTING FORCEPS) IMPLANT
FORCEPS BIOP SUPERTRX PREMAR (INSTRUMENTS) ×6 IMPLANT
GAUZE SPONGE 4X4 12PLY STRL (GAUZE/BANDAGES/DRESSINGS) ×3 IMPLANT
GLOVE BIO SURGEON STRL SZ7.5 (GLOVE) ×6 IMPLANT
GOWN STRL REUS W/ TWL LRG LVL3 (GOWN DISPOSABLE) ×4 IMPLANT
GOWN STRL REUS W/TWL LRG LVL3 (GOWN DISPOSABLE) ×2
KIT CLEAN ENDO COMPLIANCE (KITS) ×6 IMPLANT
KIT ILLUMISITE 180 PROCEDURE (KITS) ×3 IMPLANT
KIT ILLUMISITE 90 PROCEDURE (KITS) IMPLANT
KIT LOCATABLE GUIDE (CANNULA) IMPLANT
KIT MARKER FIDUCIAL DELIVERY (KITS) ×3 IMPLANT
KIT TURNOVER KIT B (KITS) ×3 IMPLANT
MARKER FIDUCIAL SL NIT COIL (Implant Marker) ×9 IMPLANT
MARKER SKIN DUAL TIP RULER LAB (MISCELLANEOUS) ×3 IMPLANT
NEEDLE ARCPOINT PULMONARY 18GA (NEEDLE) ×3 IMPLANT
NEEDLE ASPIRATION VIZISHOT 19G (NEEDLE) ×3 IMPLANT
NEEDLE ASPIRATION VIZISHOT 21G (NEEDLE) IMPLANT
NEEDLE SUPERTRX PREMARK BIOPSY (NEEDLE) ×6 IMPLANT
NS IRRIG 1000ML POUR BTL (IV SOLUTION) ×3 IMPLANT
OIL SILICONE PENTAX (PARTS (SERVICE/REPAIRS)) ×3 IMPLANT
PAD ARMBOARD 7.5X6 YLW CONV (MISCELLANEOUS) ×6 IMPLANT
PATCHES PATIENT (LABEL) ×9 IMPLANT
SYR 20ML ECCENTRIC (SYRINGE) ×6 IMPLANT
SYR 20ML LL LF (SYRINGE) ×6 IMPLANT
SYR 50ML SLIP (SYRINGE) ×3 IMPLANT
SYR 5ML LUER SLIP (SYRINGE) ×3 IMPLANT
TOWEL GREEN STERILE FF (TOWEL DISPOSABLE) ×3 IMPLANT
TRAP SPECIMEN MUCUS 40CC (MISCELLANEOUS) IMPLANT
TUBE CONNECTING 20X1/4 (TUBING) ×6 IMPLANT
UNDERPAD 30X36 HEAVY ABSORB (UNDERPADS AND DIAPERS) ×3 IMPLANT
VALVE BIOPSY  SINGLE USE (MISCELLANEOUS) ×1
VALVE BIOPSY SINGLE USE (MISCELLANEOUS) ×2 IMPLANT
VALVE SUCTION BRONCHIO DISP (MISCELLANEOUS) ×3 IMPLANT
WATER STERILE IRR 1000ML POUR (IV SOLUTION) ×3 IMPLANT

## 2020-10-18 NOTE — Discharge Instructions (Signed)
Flexible Bronchoscopy, Care After This sheet gives you information about how to care for yourself after your test. Your doctor may also give you more specific instructions. If you have problems or questions, contact your doctor. Follow these instructions at home: Eating and drinking  Do not eat or drink anything (not even water) for 2 hours after your test, or until your numbing medicine (local anesthetic) wears off.  When your numbness is gone and your cough and gag reflexes have come back, you may: ? Eat only soft foods. ? Slowly drink liquids.  The day after the test, go back to your normal diet. Driving  Do not drive for 24 hours if you were given a medicine to help you relax (sedative).  Do not drive or use heavy machinery while taking prescription pain medicine. General instructions   Take over-the-counter and prescription medicines only as told by your doctor.  Return to your normal activities as told. Ask what activities are safe for you.  Do not use any products that have nicotine or tobacco in them. This includes cigarettes and e-cigarettes. If you need help quitting, ask your doctor.  Keep all follow-up visits as told by your doctor. This is important. It is very important if you had a tissue sample (biopsy) taken. Get help right away if:  You have shortness of breath that gets worse.  You get light-headed.  You feel like you are going to pass out (faint).  You have chest pain.  You cough up: ? More than a little blood. ? More blood than before. Summary  Do not eat or drink anything (not even water) for 2 hours after your test, or until your numbing medicine wears off.  Do not use cigarettes. Do not use e-cigarettes.  Get help right away if you have chest pain.  Please call our office for any questions or concerns.  [phone].  This information is not intended to replace advice given to you by your health care provider. Make sure you discuss any  questions you have with your health care provider. Document Released: 06/10/2009 Document Revised: 07/26/2017 Document Reviewed: 08/31/2016 Elsevier Patient Education  2020 [employer].

## 2020-10-18 NOTE — Interval H&P Note (Signed)
History and Physical Interval Note:  10/18/2020 9:34 AM  Susan Cortez  has presented today for surgery, with the diagnosis of LUNG NODULE.  The various methods of treatment have been discussed with the patient and family. After consideration of risks, benefits and other options for treatment, the patient has consented to  Procedure(s): VIDEO BRONCHOSCOPY WITH ENDOBRONCHIAL NAVIGATION (N/A) VIDEO BRONCHOSCOPY WITH ENDOBRONCHIAL ULTRASOUND (N/A) as a surgical intervention.  The patient's history has been reviewed, patient examined, no change in status, stable for surgery.  I have reviewed the patient's chart and labs.  Questions were answered to the patient's satisfaction.     Collene Gobble

## 2020-10-18 NOTE — Anesthesia Procedure Notes (Signed)
Procedure Name: Intubation Date/Time: 10/18/2020 10:32 AM Performed by: Eligha Bridegroom, CRNA Pre-anesthesia Checklist: Patient identified, Emergency Drugs available, Suction available, Patient being monitored and Timeout performed Patient Re-evaluated:Patient Re-evaluated prior to induction Oxygen Delivery Method: Circle system utilized Preoxygenation: Pre-oxygenation with 100% oxygen Induction Type: IV induction Ventilation: Mask ventilation without difficulty and Oral airway inserted - appropriate to patient size Laryngoscope Size: Mac and 4 Grade View: Grade II Tube type: Oral Tube size: 8.0 mm Number of attempts: 1 Airway Equipment and Method: Stylet Placement Confirmation: ETT inserted through vocal cords under direct vision,  positive ETCO2 and breath sounds checked- equal and bilateral Secured at: 23 cm Tube secured with: Tape Dental Injury: Teeth and Oropharynx as per pre-operative assessment

## 2020-10-18 NOTE — Op Note (Signed)
Video Bronchoscopy with Endobronchial Ultrasound and Electromagnetic Navigation Procedure Note  Date of Operation: 10/18/2020  Pre-op Diagnosis: Right upper lobe pulmonary nodule, mediastinal adenopathy  Post-op Diagnosis: Same  Surgeon: Baltazar Apo  Assistants: None  Anesthesia: General endotracheal anesthesia  Operation: Flexible video fiberoptic bronchoscopy with endobronchial ultrasound, electromagnetic navigation and biopsies.  Estimated Blood Loss: Minimal  Complications: None apparent  Indications and History: Susan Cortez is a 60 y.o. female with history of tobacco use.  She was found to have a somewhat lobulated right upper lobe pulmonary nodule on lung cancer screening CT.  Follow-up revealed enlargement, mediastinal adenopathy..  Recommendation was made to achieve a tissue diagnosis using endobronchial ultrasound and navigational bronchoscopy. The risks, benefits, complications, treatment options and expected outcomes were discussed with the patient.  The possibilities of pneumothorax, pneumonia, reaction to medication, pulmonary aspiration, perforation of a viscus, bleeding, failure to diagnose a condition and creating a complication requiring transfusion or operation were discussed with the patient who freely signed the consent.    Description of Procedure: The patient was examined in the preoperative area and history and data from the preprocedure consultation were reviewed. It was deemed appropriate to proceed.  The patient was taken to [facility] in [place] room 11, identified as RIKO LUMSDEN and the procedure verified as Flexible Video Fiberoptic Bronchoscopy.  A Time Out was held and the above information confirmed. After being taken to the operating room general anesthesia was initiated and the patient  was orally intubated. The video fiberoptic bronchoscope was introduced via the endotracheal tube and a general inspection was performed which showed normal airways  throughout.  There were no endobronchial lesions or abnormal secretions seen.   Attention was then turned to the patient's peripheral lesion. Prior to the date of the procedure a high-resolution CT scan of the chest was performed. Utilizing [place] a virtual tracheobronchial tree was generated to allow the creation of distinct navigation pathways to the patient's parenchymal abnormality. The video fiberoptic bronchoscope was re-introduced via the endotracheal tube. An extendable working channel and locator guide were introduced into the bronchoscope. The distinct navigation pathways prepared prior to this procedure were then utilized to navigate to within 0.2 cm of patient's right upper lobe nodule identified on CT scan. The extendable working channel was secured into place and the locator guide was withdrawn. Under fluoroscopic guidance transbronchial needle brushings, transbronchial Wang needle biopsies, and transbronchial forceps biopsies were performed to be sent for cytology and pathology.  Three fiducial markers were placed under fluoroscopic guidance triangulating lesion should radiation therapy be warranted going forward.  The standard scope was then withdrawn and the endobronchial ultrasound was used to identify and characterize the peritracheal, hilar and bronchial lymph nodes. Inspection showed enlargement of station 4R, 10 R, 11 R nodes.  There was no abnormal lymphadenopathy at station seven or anywhere on the left. Using real-time ultrasound guidance Wang needle biopsies were take from Station 4R, 10 R, 11 R nodes and were sent for cytology.   At the end of the procedure a general airway inspection was performed and there was no evidence of active bleeding. The bronchoscope was removed.  The patient tolerated the procedure well. There was no significant blood loss and there were no obvious complications. A post-procedural chest x-ray is pending.   Samples: 1. Wang needle  biopsies from 4R node 2. Wang needle biopsies from 10 R node 3. Wang needle biopsies from 11 R node 4. Transbronchial needle brushings from right upper  lobe nodule 5. Transbronchial Wang needle biopsies from right upper lobe nodule 6. Transbronchial forceps biopsies from right upper lobe nodule  Plans:  The patient will be discharged from the PACU to home when recovered from anesthesia. We will review the cytology, pathology and microbiology results with the patient when they become available. Outpatient followup will be with Dr. Lamonte Sakai.    Baltazar Apo, MD, PhD 10/18/2020, 12:06 PM [facility] [phone] or if no answer before 7:00PM call [phone] For any issues after 7:00PM please call eLink [phone]

## 2020-10-18 NOTE — Anesthesia Postprocedure Evaluation (Signed)
Anesthesia Post Note  Patient: Susan Cortez  Procedure(s) Performed: VIDEO BRONCHOSCOPY WITH ENDOBRONCHIAL NAVIGATION (N/A ) VIDEO BRONCHOSCOPY WITH ENDOBRONCHIAL ULTRASOUND (N/A ) PLACEMENT OF FUDUCIAL (Right Chest)     Patient location during evaluation: PACU Anesthesia Type: General Level of consciousness: awake and alert Pain management: pain level controlled Vital Signs Assessment: post-procedure vital signs reviewed and stable Respiratory status: spontaneous breathing, nonlabored ventilation and respiratory function stable Cardiovascular status: blood pressure returned to baseline and stable Postop Assessment: no apparent nausea or vomiting Anesthetic complications: no   No complications documented.  Last Vitals:  Vitals:   10/18/20 1240 10/18/20 1255  BP: (!) 128/59 120/68  Pulse: 75 79  Resp: 14 18  Temp:  36.9 C  SpO2: 99% 96%    Last Pain:  Vitals:   10/18/20 1255  TempSrc:   PainSc: 0-No pain                 Akram Kissick,W. EDMOND

## 2020-10-18 NOTE — Anesthesia Preprocedure Evaluation (Signed)
Anesthesia Evaluation  Patient identified by MRN, date of birth, ID band Patient awake    Reviewed: Allergy & Precautions, H&P , NPO status , Patient's Chart, lab work & pertinent test results  Airway Mallampati: III  TM Distance: >3 FB Neck ROM: Full    Dental no notable dental hx. (+) Teeth Intact, Dental Advisory Given   Pulmonary Current Smoker and Patient abstained from smoking.,    Pulmonary exam normal breath sounds clear to auscultation       Cardiovascular negative cardio ROS   Rhythm:Regular Rate:Normal     Neuro/Psych negative neurological ROS  negative psych ROS   GI/Hepatic negative GI ROS, Neg liver ROS,   Endo/Other  negative endocrine ROS  Renal/GU negative Renal ROS  negative genitourinary   Musculoskeletal   Abdominal   Peds  Hematology negative hematology ROS (+)   Anesthesia Other Findings   Reproductive/Obstetrics negative OB ROS                             Anesthesia Physical Anesthesia Plan  ASA: II  Anesthesia Plan: General   Post-op Pain Management:    Induction: Intravenous  PONV Risk Score and Plan: 3 and Ondansetron, Dexamethasone and Midazolam  Airway Management Planned: Oral ETT  Additional Equipment:   Intra-op Plan:   Post-operative Plan: Extubation in OR  Informed Consent: I have reviewed the patients History and Physical, chart, labs and discussed the procedure including the risks, benefits and alternatives for the proposed anesthesia with the patient or authorized representative who has indicated his/her understanding and acceptance.     Dental advisory given  Plan Discussed with: CRNA  Anesthesia Plan Comments:         Anesthesia Quick Evaluation

## 2020-10-18 NOTE — Transfer of Care (Signed)
Immediate Anesthesia Transfer of Care Note  Patient: Susan Cortez  Procedure(s) Performed: VIDEO BRONCHOSCOPY WITH ENDOBRONCHIAL NAVIGATION (N/A ) VIDEO BRONCHOSCOPY WITH ENDOBRONCHIAL ULTRASOUND (N/A )  Patient Location: PACU  Anesthesia Type:General  Level of Consciousness: awake, alert  and oriented  Airway & Oxygen Therapy: Patient Spontanous Breathing  Post-op Assessment: Report given to RN and Post -op Vital signs reviewed and stable  Post vital signs: Reviewed and stable  Last Vitals:  Vitals Value Taken Time  BP 124/62 10/18/20 1210  Temp    Pulse 84 10/18/20 1215  Resp 17 10/18/20 1215  SpO2 94 % 10/18/20 1215  Vitals shown include unvalidated device data.  Last Pain:  Vitals:   10/18/20 0815  TempSrc: Oral         Complications: No complications documented.

## 2020-10-19 ENCOUNTER — Encounter (HOSPITAL_COMMUNITY): Payer: Self-pay | Admitting: Emergency Medicine

## 2020-10-20 ENCOUNTER — Telehealth: Payer: Self-pay | Admitting: Emergency Medicine

## 2020-10-20 ENCOUNTER — Telehealth: Payer: Self-pay | Admitting: Physician Assistant

## 2020-10-20 DIAGNOSIS — C3490 Malignant neoplasm of unspecified part of unspecified bronchus or lung: Secondary | ICD-10-CM

## 2020-10-20 LAB — CYTOLOGY - NON PAP

## 2020-10-20 NOTE — Telephone Encounter (Signed)
Called and spoke with pt about the message from [place]. Pt's appt has been rescheduled for 4/25. Nothing further needed.

## 2020-10-20 NOTE — Telephone Encounter (Signed)
I discussed biopsy results with patient, show small cell lung CA.   I referred her [place].   Please reschedule her upcoming OV with me for 2 months from now. Thanks.

## 2020-10-20 NOTE — Telephone Encounter (Signed)
Received a new pt referral from Dr. Lamonte Sakai for small cell lung cancer. Susan Cortez has been cld and scheduled to see Cassie on 3/2 at 10am w/labs at 9:30am. Pt aware to arrive 20 minutes early.

## 2020-10-24 ENCOUNTER — Encounter: Payer: Self-pay | Admitting: Gastroenterology

## 2020-10-24 ENCOUNTER — Other Ambulatory Visit: Payer: Self-pay

## 2020-10-24 ENCOUNTER — Ambulatory Visit (AMBULATORY_SURGERY_CENTER): Payer: Medicare Other | Admitting: Gastroenterology

## 2020-10-24 ENCOUNTER — Other Ambulatory Visit: Payer: Self-pay | Admitting: Medical Oncology

## 2020-10-24 VITALS — BP 119/69 | HR 76 | Temp 98.1°F | Resp 19 | Ht 68.0 in | Wt 221.0 lb

## 2020-10-24 DIAGNOSIS — Z86010 Personal history of colonic polyps: Secondary | ICD-10-CM | POA: Diagnosis not present

## 2020-10-24 DIAGNOSIS — D128 Benign neoplasm of rectum: Secondary | ICD-10-CM | POA: Diagnosis not present

## 2020-10-24 DIAGNOSIS — D129 Benign neoplasm of anus and anal canal: Secondary | ICD-10-CM

## 2020-10-24 DIAGNOSIS — D72823 Leukemoid reaction: Secondary | ICD-10-CM

## 2020-10-24 DIAGNOSIS — D124 Benign neoplasm of descending colon: Secondary | ICD-10-CM | POA: Diagnosis not present

## 2020-10-24 DIAGNOSIS — D122 Benign neoplasm of ascending colon: Secondary | ICD-10-CM

## 2020-10-24 MED ORDER — SODIUM CHLORIDE 0.9 % IV SOLN: 500.0000 mL | Freq: Once | INTRAVENOUS | Status: DC

## 2020-10-24 NOTE — Op Note (Signed)
[facility] Patient Name: Susan Cortez Procedure Date: 10/24/2020 1:28 PM MRN: [id number] Endoscopist: Remo Lipps P. Havery Moros , MD Age: 60 Referring MD:  Date of Birth: 02-13-1961 Gender: Female Account #: [id number] Procedure:                Colonoscopy Indications:              High risk colon cancer surveillance: Personal                            history of colonic polyps - patient reports having                            colonoscopy every 3 years or so for history of                            colon polyps. Medicines:                Monitored Anesthesia Care Procedure:                Pre-Anesthesia Assessment:                           - Prior to the procedure, a History and Physical                            was performed, and patient medications and                            allergies were reviewed. The patient's tolerance of                            previous anesthesia was also reviewed. The risks                            and benefits of the procedure and the sedation                            options and risks were discussed with the patient.                            All questions were answered, and informed consent                            was obtained. Prior Anticoagulants: The patient has                            taken no previous anticoagulant or antiplatelet                            agents. ASA Grade Assessment: III - A patient with                            severe systemic disease. After reviewing the risks  and benefits, the patient was deemed in                            satisfactory condition to undergo the procedure.                           After obtaining informed consent, the colonoscope                            was passed under direct vision. Throughout the                            procedure, the patient's blood pressure, pulse, and                            oxygen saturations were monitored  continuously. The                            Olympus PCF-H190DL (#[id number]) Colonoscope was                            introduced through the anus and advanced to the the                            cecum, identified by appendiceal orifice and                            ileocecal valve. The colonoscopy was performed                            without difficulty. The patient tolerated the                            procedure well. The quality of the bowel                            preparation was good. The ileocecal valve,                            appendiceal orifice, and rectum were photographed. Scope In: 1:33:31 PM Scope Out: 1:57:09 PM Scope Withdrawal Time: 0 hours 16 minutes 30 seconds  Total Procedure Duration: 0 hours 23 minutes 38 seconds  Findings:                 The perianal and digital rectal examinations were                            normal.                           Multiple medium-mouthed diverticula were found in                            the sigmoid colon.  A single small angiodysplastic lesion was found in                            the ascending colon.                           Two flat polyps were found in the ascending colon.                            The polyps were 3 to 4 mm in size. These polyps                            were removed with a cold snare. Resection and                            retrieval were complete.                           A 4 mm polyp was found in the descending colon. The                            polyp was sessile. The polyp was removed with a                            cold snare. Resection and retrieval were complete.                           A 4 to 5 mm polyp was found in the rectum. The                            polyp was sessile. The polyp was removed with a                            cold snare. Resection and retrieval were complete.                           The exam was otherwise without  abnormality. Complications:            No immediate complications. Estimated blood loss:                            Minimal. Estimated Blood Loss:     Estimated blood loss was minimal. Impression:               - Diverticulosis in the sigmoid colon.                           - A single colonic angiodysplastic lesion.                           - Two 3 to 4 mm polyps in the ascending colon,                            removed  with a cold snare. Resected and retrieved.                           - One 4 mm polyp in the descending colon, removed                            with a cold snare. Resected and retrieved.                           - One 4 to 5 mm polyp in the rectum, removed with a                            cold snare. Resected and retrieved.                           - The examination was otherwise normal. Recommendation:           - Patient has a contact number available for                            emergencies. The signs and symptoms of potential                            delayed complications were discussed with the                            patient. Return to normal activities tomorrow.                            Written discharge instructions were provided to the                            patient.                           - Resume previous diet.                           - Continue present medications.                           - Await pathology results. Remo Lipps P. Havery Moros, MD 10/24/2020 2:02:39 PM This report has been signed electronically.

## 2020-10-24 NOTE — Patient Instructions (Signed)
Impression/Recommendations:  Polyp and diverticulosis handouts given to patient.  Resume previous diet. Continue present medications. Await pathology results.  YOU HAD AN ENDOSCOPIC PROCEDURE TODAY AT [facility]:   Refer to the procedure report that was given to you for any specific questions about what was found during the examination.  If the procedure report does not answer your questions, please call your gastroenterologist to clarify.  If you requested that your care partner not be given the details of your procedure findings, then the procedure report has been included in a sealed envelope for you to review at your convenience later.  YOU SHOULD EXPECT: Some feelings of bloating in the abdomen. Passage of more gas than usual.  Walking can help get rid of the air that was put into your GI tract during the procedure and reduce the bloating. If you had a lower endoscopy (such as a colonoscopy or flexible sigmoidoscopy) you may notice spotting of blood in your stool or on the toilet paper. If you underwent a bowel prep for your procedure, you may not have a normal bowel movement for a few days.  Please Note:  You might notice some irritation and congestion in your nose or some drainage.  This is from the oxygen used during your procedure.  There is no need for concern and it should clear up in a day or so.  SYMPTOMS TO REPORT IMMEDIATELY:   Following lower endoscopy (colonoscopy or flexible sigmoidoscopy):  Excessive amounts of blood in the stool  Significant tenderness or worsening of abdominal pains  Swelling of the abdomen that is new, acute  Fever of 100F or higher  For urgent or emergent issues, a gastroenterologist can be reached at any hour by calling [phone]. Do not use MyChart messaging for urgent concerns.    DIET:  We do recommend a small meal at first, but then you may proceed to your regular diet.  Drink plenty of fluids but you should avoid  alcoholic beverages for 24 hours.  ACTIVITY:  You should plan to take it easy for the rest of today and you should NOT DRIVE or use heavy machinery until tomorrow (because of the sedation medicines used during the test).    FOLLOW UP: Our staff will call the number listed on your records 48-72 hours following your procedure to check on you and address any questions or concerns that you may have regarding the information given to you following your procedure. If we do not reach you, we will leave a message.  We will attempt to reach you two times.  During this call, we will ask if you have developed any symptoms of COVID 19. If you develop any symptoms (ie: fever, flu-like symptoms, shortness of breath, cough etc.) before then, please call [phone].  If you test positive for Covid 19 in the 2 weeks post procedure, please call and report this information to [place].    If any biopsies were taken you will be contacted by phone or by letter within the next 1-3 weeks.  Please call us at [phone] if you have not heard about the biopsies in 3 weeks.    SIGNATURES/CONFIDENTIALITY: You and/or your care partner have signed paperwork which will be entered into your electronic medical record.  These signatures attest to the fact that that the information above on your After Visit Summary has been reviewed and is understood.  Full responsibility of the confidentiality of this discharge information lies with you and/or your  care-partner.

## 2020-10-24 NOTE — Progress Notes (Signed)
Report to PACU, RN, vss, BBS= Clear.  

## 2020-10-24 NOTE — Progress Notes (Signed)
Called to room to assist during endoscopic procedure.  Patient ID and intended procedure confirmed with present staff. Received instructions for my participation in the procedure from the performing physician.  

## 2020-10-24 NOTE — Progress Notes (Signed)
Pt's states no medical or surgical changes since previsit or office visit. 

## 2020-10-25 NOTE — Progress Notes (Signed)
[place] [phone]   [phone]  CONSULT NOTE  REFERRING PHYSICIAN: Dr. Lamonte Sakai  REASON FOR CONSULTATION:  Small Cell Lung Cancer  HPI Susan Cortez is a 60 y.o. female with a past medical history significant for prediabetes, hyperlipidemia, COPD, tenosynovitis, rotator cuff sprain, and allergic rhinitis is referred to the clinic for evaluation of newly diagnosed small cell lung cancer.  The patient's evaluation began in June 14, 2020 when the patient had a low-dose lung cancer screening.  The CT scan showed a new irregular shaped nodule area of architectural distortion in the posterior aspect of the right upper lobe, 10 mm, that was suggested to represent a benign area of mucoid impaction but developing neoplasm could not be excluded.  She is referred to pulmonology for further evaluation.  A follow-up CT scan in 3 months was recommended to evaluate for resolution versus interval change.  The patient then had a repeat CT scan performed on 10/05/2020 which showed progression of mediastinal right hilar lymphadenopathy and an increase in size of the right upper lobe nodule.   The patient then underwent a bronchoscopy under the care of Dr. Lamonte Sakai on 10/18/20 and biopsy which the pathology ([id number] ) was consistent for small cell lung cancer. The 4R and 10R lymph nodes were positive for carcinoma.   Overall, the patient is feeling fairly well today. She denies fevers, chills, night sweats or weight loss. She denies chest pain, shortness of breath, or hemoptyosis. She occasionally has a cough which produces sputum. She denies nauea, vomiting, or diarrhea. She has baseline constipation for which she uses a stool softener. She notices occassional headaches in the left frontal and left temportal region. These typically occur whe she lays down and lastas about 1 hour or so. She rates her pain a 5/10. She denies associated visual changes or gait changes. She  also states she has had some dypshagia and a feeling like food is getting stuck in her throat. She localized the area to her right cervical area. She states this was evaluated about 1 year ago with ultrasound which did not show any abnormalities.   Her family history consists of a mother who has vascular dementia and CHF. Her father passed away and had 4 stents placed in his heart, had diabetes, obesity, HTN, and HLD. Her sister has diabetes and was previously treated for pancreatitis. Her one brother had an MI. Her other brother had cirrhosis of the liver.   The patient used to work teaching reading. She also worked at an [employer]. She is divorced and has 3 children. She has been smoking about 45 years averaging 1 ppd. She denies drug use. She has not drank alcohol in 20 years.    HPI  Past Medical History:  Diagnosis Date  . Allergy   . Arthritis   . Basal cell carcinoma 2018   Removed from Right neck per patient   . HLD (hyperlipidemia)   . OAB (overactive bladder)   . Pre-diabetes     Past Surgical History:  Procedure Laterality Date  . CESAREAN SECTION     X 3  . COLONOSCOPY  2013, 2016, 2019   hx colon polyps  . DILATION AND CURETTAGE OF UTERUS    . FUDUCIAL PLACEMENT Right 10/18/2020   Procedure: PLACEMENT OF FUDUCIAL;  Surgeon: Collene Gobble, MD;  Location: [facility];  Service: Thoracic;  Laterality: Right;  . SHOULDER ARTHROSCOPY WITH OPEN ROTATOR CUFF REPAIR Right 05/2009  .  TONSILLECTOMY  1977  . TUBAL LIGATION    . VIDEO BRONCHOSCOPY WITH ENDOBRONCHIAL NAVIGATION N/A 10/18/2020   Procedure: VIDEO BRONCHOSCOPY WITH ENDOBRONCHIAL NAVIGATION;  Surgeon: Collene Gobble, MD;  Location: [facility];  Service: Thoracic;  Laterality: N/A;  . VIDEO BRONCHOSCOPY WITH ENDOBRONCHIAL ULTRASOUND N/A 10/18/2020   Procedure: VIDEO BRONCHOSCOPY WITH ENDOBRONCHIAL ULTRASOUND;  Surgeon: Collene Gobble, MD;  Location: [facility];  Service: Thoracic;  Laterality: N/A;  . WISDOM TOOTH EXTRACTION       Family History  Problem Relation Age of Onset  . Diabetes Father   . Heart disease Father   . Hyperlipidemia Father   . Colon cancer Neg Hx   . Colon polyps Neg Hx   . Esophageal cancer Neg Hx   . Rectal cancer Neg Hx   . Stomach cancer Neg Hx     Social History Social History   Tobacco Use  . Smoking status: Current Every Day Smoker    Packs/day: 1.00    Years: 45.00    Pack years: 45.00    Types: Cigarettes  . Smokeless tobacco: Never Used  . Tobacco comment: 1 or more packs daily  Vaping Use  . Vaping Use: Former  Substance Use Topics  . Alcohol use: No  . Drug use: No    Allergies  Allergen Reactions  . Lyrica [Pregabalin] Swelling    Headache,severe eye pain, swelling  . Butrans [Buprenorphine] Itching    Itching and redness at the area of the patch.  . Chantix [Varenicline] Other (See Comments)    Strange dreams/hallucinations  . Diclofenac Swelling  . Meloxicam Swelling  . Tolmetin Other (See Comments)    Stomach pain  . Nsaids Other (See Comments)    Stomach pain    Current Outpatient Medications  Medication Sig Dispense Refill  . acetaminophen-codeine (TYLENOL #4) 300-60 MG tablet Take 1 tablet by mouth daily. 30 tablet 2  . CALCIUM PO Take 650 mg by mouth daily.    . Cholecalciferol (VITAMIN D3) 125 MCG (5000 UT) CAPS Take 5,000 Units by mouth daily.    Marland Kitchen LORazepam (ATIVAN) 0.5 MG tablet Take 1 tablet (0.5 mg total) by mouth every 8 (eight) hours. 4 tablet 0  . lovastatin (MEVACOR) 20 MG tablet TAKE 1 TABLET(20 MG) BY MOUTH AT BEDTIME (Patient taking differently: Take 20 mg by mouth at bedtime.) 90 tablet 3  . MYRBETRIQ 50 MG TB24 tablet TAKE 1 TABLET BY MOUTH EVERY DAY (Patient taking differently: Take 50 mg by mouth daily.) 90 tablet 2   No current facility-administered medications for this visit.    REVIEW OF SYSTEMS:   Review of Systems  Constitutional: Negative for appetite change, chills, fatigue, fever and unexpected weight change.   HENT: Negative for mouth sores, nosebleeds, sore throat and trouble swallowing.   Eyes: Negative for eye problems and icterus.  Respiratory: Positive for mild cough. Negative for hemoptysis, shortness of breath and wheezing.   Cardiovascular: Negative for chest pain and leg swelling.  Gastrointestinal: Negative for abdominal pain, constipation, diarrhea, nausea and vomiting.  Genitourinary: Negative for bladder incontinence, difficulty urinating, dysuria, frequency and hematuria.   Musculoskeletal: Positive for occasional rt sided neck pain. Negative for back pain, gait problem, neck pain and neck stiffness.  Skin: Negative for itching and rash.  Neurological: Positive for occasional headaches. Negative for dizziness, extremity weakness, gait problem, light-headedness and seizures.  Hematological: Negative for adenopathy. Does not bruise/bleed easily.  Psychiatric/Behavioral: Negative for confusion, depression and sleep disturbance. The patient  is not nervous/anxious.     PHYSICAL EXAMINATION:  Blood pressure 133/60, pulse 86, temperature 97.9 F (36.6 C), temperature source Temporal, resp. rate 20, height $RemoveBe'5\' 8"'MOgzLsYCQ$  (1.727 m), weight 219 lb 12.8 oz (99.7 kg), SpO2 98 %.  ECOG PERFORMANCE STATUS: 1 - Symptomatic but completely ambulatory  Physical Exam  Constitutional: Oriented to person, place, and time and well-developed, well-nourished, and in no distress.  HENT:  Head: Normocephalic and atraumatic.  Mouth/Throat: Oropharynx is clear and moist. No oropharyngeal exudate.  Eyes: Conjunctivae are normal. Right eye exhibits no discharge. Left eye exhibits no discharge. No scleral icterus.  Neck: Normal range of motion. Neck supple.  Cardiovascular: Normal rate, regular rhythm, normal heart sounds and intact distal pulses.   Pulmonary/Chest: Effort normal and breath sounds normal. No respiratory distress. No wheezes. No rales.  Abdominal: Soft. Bowel sounds are normal. Exhibits no distension  and no mass. There is no tenderness.  Musculoskeletal: Normal range of motion. Exhibits no edema.  Lymphadenopathy:    No cervical adenopathy.  Neurological: Alert and oriented to person, place, and time. Exhibits normal muscle tone. Gait normal. Coordination normal.  Skin: Skin is warm and dry. No rash noted. Not diaphoretic. No erythema. No pallor.  Psychiatric: Mood, memory and judgment normal.  Vitals reviewed.  LABORATORY DATA: Lab Results  Component Value Date   WBC 9.0 10/26/2020   HGB 14.6 10/26/2020   HCT 43.8 10/26/2020   MCV 93.6 10/26/2020   PLT 394 10/26/2020      Chemistry      Component Value Date/Time   NA 139 10/26/2020 0928   K 3.6 10/26/2020 0928   CL 107 10/26/2020 0928   CO2 23 10/26/2020 0928   BUN 5 (L) 10/26/2020 0928   CREATININE 0.82 10/26/2020 0928   CREATININE 0.65 06/08/2020 1050      Component Value Date/Time   CALCIUM 9.6 10/26/2020 0928   ALKPHOS 58 10/26/2020 0928   AST 19 10/26/2020 0928   ALT 23 10/26/2020 0928   BILITOT 0.2 (L) 10/26/2020 0928       RADIOGRAPHIC STUDIES: DG Chest Port 1 View  Result Date: 10/18/2020 CLINICAL DATA:  Post right upper lobe bronchoscopy with biopsy EXAM: PORTABLE CHEST 1 VIEW COMPARISON:  09/25/2020 FINDINGS: Post biopsy changes in the upper right lung. No pneumothorax. Lung aeration is otherwise stable. No pleural effusion. Stable cardiomediastinal contours. IMPRESSION: Post biopsy changes.  No pneumothorax. Electronically Signed   By: Macy Mis M.D.   On: 10/18/2020 12:48   CT Super D Chest Wo Contrast  Result Date: 10/05/2020 CLINICAL DATA:  Super D protocol for navigational bronchoscopy for right upper lobe pulmonary lesion. EXAM: CT CHEST WITHOUT CONTRAST TECHNIQUE: Multidetector CT imaging of the chest was performed using thin slice collimation for electromagnetic bronchoscopy planning purposes, without intravenous contrast. COMPARISON:  Low-dose screening chest CT 06/14/2020 FINDINGS:  Cardiovascular: The heart is normal in size. No pericardial effusion. Stable age advanced atherosclerotic calcification and mild tortuosity of the thoracic aorta. Minimal scattered coronary artery calcifications are also noted. Mediastinum/Nodes: Progression of mediastinal lymphadenopathy. There is an 18.5 mm precarinal lymph node on image number 55/3. This previously measured 11 mm. 8.5 mm right paratracheal node on image 45/3 previously measured 4 mm. Suspect right hilar adenopathy but difficult to measure for certain. Suspect 12 mm right hilar node on image number 65/3. 8 mm prevascular lymph node is stable. Small scattered subcarinal lymph nodes appears stable. The esophagus is grossly normal. Lungs/Pleura: Stable emphysematous changes and areas of  pulmonary scarring. Branching nodular lesion in the right upper lobe posteriorly could reflect endobronchial tumor. Maximum measurement is 16 mm and was previously 13 mm. No other pulmonary lesions are identified. No acute pulmonary findings. No pleural effusions or pleural nodules. Upper Abdomen: No significant upper abdominal findings. Low-attenuation left adrenal gland lesion most consistent with a benign adenoma. Musculoskeletal: No breast masses, supraclavicular or axillary adenopathy. There is a 12.5 mm right thyroid nodule which is stable and has been previously evaluated with ultrasound. This has been evaluated on previous imaging. (ref: J Am Coll Radiol. 2015 Feb;12(2): 143-50).No significant bony findings. IMPRESSION: 1. Progression of mediastinal and right hilar lymphadenopathy. 2. Branching nodular lesion in the right upper lobe posteriorly has increased in size and could reflect endobronchial tumor. 3. Stable emphysematous changes and pulmonary scarring. 4. Stable left adrenal gland adenoma. 5. Stable age advanced atherosclerotic calcification involving the thoracic aorta and coronary arteries. 6. Emphysema and aortic atherosclerosis. Aortic  Atherosclerosis (ICD10-I70.0) and Emphysema (ICD10-J43.9). Electronically Signed   By: Marijo Sanes M.D.   On: 10/05/2020 15:25   DG C-ARM BRONCHOSCOPY  Result Date: 10/18/2020 C-ARM BRONCHOSCOPY: Fluoroscopy was utilized by the requesting physician.  No radiographic interpretation.    ASSESSMENT: This is a very pleasant 60 year old Caucasian female diagnosed with small cell lung cancer.  She presented with a right upper lobe lung nodule and mediastinal and hilar lymphadenopathy pending further staging work-up with PET scan and brain MRI.  She was diagnosed in February 2022   PLAN: The patient was seen with Dr. Julien Nordmann today.  Dr. Julien Nordmann had a lengthy discussion the patient about her current condition and recommended treatment options.  The patient still requires a brain MRI and PET scan to complete staging work-up.   Dr. Julien Nordmann discussed that if the patient's disease was found to be limited stage small cell lung cancer then he would recommend chemoradiation. If her disease is found to be extensive stage, he discussed that treatment would be palliative in nature and would consist of chemotherapy only.   We will arrange for a brain MRI and PET scan in the next 7-10 days. She is claustrophobic so I have sent 4 tablets of ativan 0.5 mg to her pharmacy. She was instructed to take 1 tablet before her scans if needed.   We will see the patient back for follow-up visit in 10 days or so to review her scans and to have a more detailed discussion about her current condition and recommended treatment option.   The patient voices understanding of current disease status and treatment options and is in agreement with the current care plan.  All questions were answered. The patient knows to call the clinic with any problems, questions or concerns. We can certainly see the patient much sooner if necessary.  Thank you so much for allowing me to participate in the care of Susan Cortez. I will continue to  follow up the patient with you and assist in her care.  I spent 40-49 minutes in this encounter   Disclaimer: This note was dictated with voice recognition software. Similar sounding words can inadvertently be transcribed and may not be corrected upon review.   Mercedees Convery L Jessikah Dicker October 26, 2020, 11:02 AM  ADDENDUM: Hematology/Oncology Attending: I had a face-to-face encounter with the patient today.  I reviewed her records, scan and recommended her care plan.  This is a very pleasant 60 years old white female with past medical history significant for COPD, dyslipidemia, allergic rhinitis as well  as long history of smoking.  The patient mentioned that she was seen by Dr. Lamonte Sakai for evaluation of her COPD and CT screening of the chest was performed on June 14, 2020 and it showed new irregular shaped nodule in the posterior aspect of the right upper lobe measuring 1.0 cm suggestive of benign area of mucoid impaction but developing neoplasm could not be excluded.  She had repeat CT scan of the chest on 10/05/2020 and it showed progression of mediastinal, right hilar adenopathy as well as increase in the size of the right upper lobe nodule.  On October 18, 2020 the patient underwent video bronchoscopy with endobronchial ultrasound and electromagnetic navigation procedure under the care of Dr. Lamonte Sakai.  The final pathology ([id number]) showed malignant cells consistent with small cell carcinoma. Immunohistochemistry is positive for CD56, synaptophysin, and TTF-1. Ki-67 is elevated. The findings are consistent with small cell carcinoma. Dr. Lamonte Sakai kindly referred the patient to me today for evaluation and recommendation regarding treatment of her condition. When seen today she is feeling fine except for cough and shortness of breath with exertion. I had a lengthy discussion with the patient today about her current disease stage prognosis and treatment options. I recommended for the patient to  complete the staging work-up by ordering a PET scan as well as MRI of the brain to rule out any metastatic disease. If the patient has no evidence of metastatic disease outside the areas seen on the current CT scan of the chest, she will have limited stage (T1b, N3, M0) small cell lung cancer, presented with right upper lobe lung nodule in addition to right hilar and bilateral mediastinal lymphadenopathy.  In this case the patient will be treated with a course of systemic chemotherapy with cisplatin and etoposide concurrent with radiation but if the patient has any evidence for metastatic disease, she will be treated with systemic chemotherapy with carboplatin, etoposide and durvalumab for 4 cycles followed by maintenance durvalumab. We will arrange for the patient a follow-up appointment in around 10 days for more detailed discussion of her treatment options based on the final staging work-up. The patient was advised to call immediately if she has any concerning symptoms in the interval. The total time spent in the appointment was 60 minutes. Disclaimer: This note was dictated with voice recognition software. Similar sounding words can inadvertently be transcribed and may be missed upon review. Eilleen Kempf, MD 10/26/20

## 2020-10-26 ENCOUNTER — Telehealth: Payer: Self-pay | Admitting: Physician Assistant

## 2020-10-26 ENCOUNTER — Inpatient Hospital Stay: Payer: Medicare Other

## 2020-10-26 ENCOUNTER — Encounter: Payer: Self-pay | Admitting: Physician Assistant

## 2020-10-26 ENCOUNTER — Other Ambulatory Visit: Payer: Self-pay

## 2020-10-26 ENCOUNTER — Telehealth: Payer: Self-pay

## 2020-10-26 ENCOUNTER — Inpatient Hospital Stay: Payer: Medicare Other | Attending: Physician Assistant | Admitting: Physician Assistant

## 2020-10-26 VITALS — BP 133/60 | HR 86 | Temp 97.9°F | Resp 20 | Ht 68.0 in | Wt 219.8 lb

## 2020-10-26 DIAGNOSIS — F17210 Nicotine dependence, cigarettes, uncomplicated: Secondary | ICD-10-CM | POA: Insufficient documentation

## 2020-10-26 DIAGNOSIS — C3490 Malignant neoplasm of unspecified part of unspecified bronchus or lung: Secondary | ICD-10-CM | POA: Diagnosis not present

## 2020-10-26 DIAGNOSIS — C3411 Malignant neoplasm of upper lobe, right bronchus or lung: Secondary | ICD-10-CM | POA: Insufficient documentation

## 2020-10-26 DIAGNOSIS — Z85820 Personal history of malignant melanoma of skin: Secondary | ICD-10-CM | POA: Insufficient documentation

## 2020-10-26 DIAGNOSIS — I509 Heart failure, unspecified: Secondary | ICD-10-CM | POA: Insufficient documentation

## 2020-10-26 DIAGNOSIS — E785 Hyperlipidemia, unspecified: Secondary | ICD-10-CM | POA: Insufficient documentation

## 2020-10-26 DIAGNOSIS — Z5111 Encounter for antineoplastic chemotherapy: Secondary | ICD-10-CM | POA: Diagnosis not present

## 2020-10-26 DIAGNOSIS — Z79899 Other long term (current) drug therapy: Secondary | ICD-10-CM | POA: Diagnosis not present

## 2020-10-26 DIAGNOSIS — J449 Chronic obstructive pulmonary disease, unspecified: Secondary | ICD-10-CM | POA: Diagnosis not present

## 2020-10-26 DIAGNOSIS — J439 Emphysema, unspecified: Secondary | ICD-10-CM | POA: Insufficient documentation

## 2020-10-26 DIAGNOSIS — D72823 Leukemoid reaction: Secondary | ICD-10-CM

## 2020-10-26 DIAGNOSIS — F40240 Claustrophobia: Secondary | ICD-10-CM | POA: Diagnosis not present

## 2020-10-26 DIAGNOSIS — Z86010 Personal history of colonic polyps: Secondary | ICD-10-CM | POA: Diagnosis not present

## 2020-10-26 LAB — CBC WITH DIFFERENTIAL (CANCER CENTER ONLY)
Abs Immature Granulocytes: 0.04 10*3/uL (ref 0.00–0.07)
Basophils Absolute: 0.1 10*3/uL (ref 0.0–0.1)
Basophils Relative: 1 %
Eosinophils Absolute: 0.1 10*3/uL (ref 0.0–0.5)
Eosinophils Relative: 1 %
HCT: 43.8 % (ref 36.0–46.0)
Hemoglobin: 14.6 g/dL (ref 12.0–15.0)
Immature Granulocytes: 0 %
Lymphocytes Relative: 32 %
Lymphs Abs: 2.8 10*3/uL (ref 0.7–4.0)
MCH: 31.2 pg (ref 26.0–34.0)
MCHC: 33.3 g/dL (ref 30.0–36.0)
MCV: 93.6 fL (ref 80.0–100.0)
Monocytes Absolute: 0.7 10*3/uL (ref 0.1–1.0)
Monocytes Relative: 8 %
Neutro Abs: 5.3 10*3/uL (ref 1.7–7.7)
Neutrophils Relative %: 58 %
Platelet Count: 394 10*3/uL (ref 150–400)
RBC: 4.68 MIL/uL (ref 3.87–5.11)
RDW: 13.3 % (ref 11.5–15.5)
WBC Count: 9 10*3/uL (ref 4.0–10.5)
nRBC: 0 % (ref 0.0–0.2)

## 2020-10-26 LAB — CMP (CANCER CENTER ONLY)
ALT: 23 U/L (ref 0–44)
AST: 19 U/L (ref 15–41)
Albumin: 4.1 g/dL (ref 3.5–5.0)
Alkaline Phosphatase: 58 U/L (ref 38–126)
Anion gap: 9 (ref 5–15)
BUN: 5 mg/dL — ABNORMAL LOW (ref 8–23)
CO2: 23 mmol/L (ref 22–32)
Calcium: 9.6 mg/dL (ref 8.9–10.3)
Chloride: 107 mmol/L (ref 98–111)
Creatinine: 0.82 mg/dL (ref 0.44–1.00)
GFR, Estimated: >60 mL/min (ref >60)
Glucose, Bld: 147 mg/dL — ABNORMAL HIGH (ref 70–99)
Potassium: 3.6 mmol/L (ref 3.5–5.1)
Sodium: 139 mmol/L (ref 135–145)
Total Bilirubin: 0.2 mg/dL — ABNORMAL LOW (ref 0.3–1.2)
Total Protein: 7.3 g/dL (ref 6.5–8.1)

## 2020-10-26 MED ORDER — LORAZEPAM 0.5 MG PO TABS: 0.5000 mg | ORAL_TABLET | Freq: Three times a day (TID) | ORAL | 0 refills | Status: DC

## 2020-10-26 NOTE — Telephone Encounter (Signed)
  Follow up Call-  Call back number 10/24/2020  Post procedure Call Back phone  # [phone]  Permission to leave phone message Yes  Some recent data might be hidden     Patient questions:  Do you have a fever, pain , or abdominal swelling? No. Pain Score  0 *  Have you tolerated food without any problems? Yes.    Have you been able to return to your normal activities? Yes.    Do you have any questions about your discharge instructions: Diet   No. Medications  No. Follow up visit  No.  Do you have questions or concerns about your Care? No.  Actions: * If pain score is 4 or above: No action needed, pain <4.  1. Have you developed a fever since your procedure? no  2.   Have you had an respiratory symptoms (SOB or cough) since your procedure? no  3.   Have you tested positive for COVID 19 since your procedure no  4.   Have you had any family members/close contacts diagnosed with the COVID 19 since your procedure?  no   If yes to any of these questions please route to Joylene John, RN and Joella Prince, RN

## 2020-10-26 NOTE — Telephone Encounter (Signed)
Scheduled per los. Gave avs and calendar  

## 2020-10-26 NOTE — Patient Instructions (Addendum)
-  There are two main categories of lung cancer, they are named based on the size of the cancer cell. One is called Non-Small cell lung cancer. The other type is Small Cell Lung Cancer -The sample (biopsy) that they took of your tumor was consistent with Small Cell Lung Cancer.  -We still need to complete the staging work-up so we know how to best treat you. I will order the brain MRI and PET scan. I have sent ativan to your pharmacy to take 1 tablet 30 minutes before your scan. I will give you an extra one if needed.  -We covered a lot of important information at your appointment today regarding what the treatment plan is moving forward. Here are the the main points that were discussed at your office visit with [place] today: If the disease is limited to the lung we will arrange for chemotherapy and radiation treatment. If the spot in the lung has spread outside of the chest, then treatment would be chemotherapy only. We will meet with you in about 10 days to go over the game plan once we have all of the information.   Imaging:  -PET scan and MRI brain.   Follow up:  -We will see you back for a follow up visit in 10 days or so to review the results and have a more detailed discussion about the plan. If you need to call us, please call [phone] and ask to talk to [facility] or Dr. Worthy Flank nurse.

## 2020-10-28 ENCOUNTER — Encounter: Payer: Self-pay | Admitting: *Deleted

## 2020-10-28 ENCOUNTER — Ambulatory Visit: Payer: Medicare Other | Admitting: Emergency Medicine

## 2020-10-28 NOTE — Progress Notes (Signed)
I followed up on Susan Cortez schedule.  She is to have work up with PET and MRI brain which are scheduled but not authorized with insurance. I reached out to revenue cycle team to help get auth.  Once auth I will call radiology to see if these scans can be moved up.

## 2020-11-02 ENCOUNTER — Encounter: Payer: Self-pay | Admitting: *Deleted

## 2020-11-02 NOTE — Progress Notes (Signed)
Scans scheduled

## 2020-11-03 ENCOUNTER — Other Ambulatory Visit: Payer: Self-pay | Admitting: *Deleted

## 2020-11-03 NOTE — Progress Notes (Signed)
The proposed treatment discussed in cancer conference is for discussion purpose only and is not a binding recommendation. The patient was not physically examined nor present for their treatment options. Therefore, final treatment plans cannot be decided.  ?

## 2020-11-05 ENCOUNTER — Ambulatory Visit (HOSPITAL_COMMUNITY)
Admission: RE | Admit: 2020-11-05 | Discharge: 2020-11-05 | Disposition: A | Discharge status: Home / Self Care | Payer: Medicare Other | Source: Ambulatory Visit | Attending: Physician Assistant | Admitting: Physician Assistant

## 2020-11-05 ENCOUNTER — Other Ambulatory Visit: Payer: Self-pay

## 2020-11-05 DIAGNOSIS — C3490 Malignant neoplasm of unspecified part of unspecified bronchus or lung: Secondary | ICD-10-CM

## 2020-11-05 DIAGNOSIS — I6389 Other cerebral infarction: Secondary | ICD-10-CM | POA: Diagnosis not present

## 2020-11-05 MED ORDER — GADOBUTROL 1 MMOL/ML IV SOLN
9.0000 mL | Freq: Once | INTRAVENOUS | Status: AC | PRN
  Administered 2020-11-05: 9 mL via INTRAVENOUS

## 2020-11-07 ENCOUNTER — Inpatient Hospital Stay: Payer: Medicare Other | Admitting: Physician Assistant

## 2020-11-07 ENCOUNTER — Telehealth: Payer: Self-pay

## 2020-11-07 NOTE — Progress Notes (Unsigned)
[facility] OFFICE PROGRESS NOTE  Copland, Gay Filler, MD [address] [place] [address]  DIAGNOSIS: Limited stage Small Cell Lung Cancer. She presented with a right upper lobe suprahilar nodule/adenopathy, right paratracheal lymph node, and a hypermetabolic right upper lobe nodule. She was diagnosed in February 2022.   PRIOR THERAPY: None  CURRENT THERAPY: Systemic chemotherapy with cisplatin 80 mg per metered squared on days 1, etoposide 100 mg per metered squared on days 1, 2, and 3 IV every 3 weeks with concurrent radiation.  First dose expected on 11/15/2020.  INTERVAL HISTORY: Susan Cortez 60 y.o. female returns to the clinic today for a follow-up visit.  The patient was recently diagnosed with small cell lung cancer pending further staging work-up.  She recently had a PET scan and MRI to complete this process.  Overall the patient is feeling fairly well today.  She denies any fever, chills, night sweats, or weight loss.  She denies any chest pain, shortness of breath, or hemoptysis.  She does describe that she feels like her breathing is "minimized" where she cannot take as deep of breath.  She occasionally reports her baseline productive cough.  She denies any nausea, vomiting, or diarrhea.  She experiences baseline constipation for which she uses a stool softener.  She also has occasional headaches in the left frontal/temporal region. The patient is here today for evaluation and to review her scan results and for more detailed discussion about her current condition and recommended treatment options.  MEDICAL HISTORY: Past Medical History:  Diagnosis Date  . Allergy   . Arthritis   . Basal cell carcinoma 2018   Removed from Right neck per patient   . Diverticulosis 2013  . HLD (hyperlipidemia)   . Hx of adenomatous colonic polyps 2013  . OAB (overactive bladder)   . Pre-diabetes   . Small cell lung cancer ([facility]) 11/09/2020    ALLERGIES:  is  allergic to lyrica [pregabalin], butrans [buprenorphine], chantix [varenicline], diclofenac, meloxicam, tolmetin, and nsaids.  MEDICATIONS:  Current Outpatient Medications  Medication Sig Dispense Refill  . prochlorperazine (COMPAZINE) 10 MG tablet Take 1 tablet (10 mg total) by mouth every 6 (six) hours as needed. 30 tablet 2  . acetaminophen-codeine (TYLENOL #4) 300-60 MG tablet Take 1 tablet by mouth daily. 30 tablet 2  . CALCIUM PO Take 650 mg by mouth daily.    . Cholecalciferol (VITAMIN D3) 125 MCG (5000 UT) CAPS Take 5,000 Units by mouth daily.    Marland Kitchen LORazepam (ATIVAN) 0.5 MG tablet Take 1 tablet (0.5 mg total) by mouth every 8 (eight) hours. 4 tablet 0  . lovastatin (MEVACOR) 20 MG tablet TAKE 1 TABLET(20 MG) BY MOUTH AT BEDTIME (Patient taking differently: Take 20 mg by mouth at bedtime.) 90 tablet 3  . MYRBETRIQ 50 MG TB24 tablet TAKE 1 TABLET BY MOUTH EVERY DAY (Patient taking differently: Take 50 mg by mouth daily.) 90 tablet 2   No current facility-administered medications for this visit.    SURGICAL HISTORY:  Past Surgical History:  Procedure Laterality Date  . CESAREAN SECTION     X 3  . COLONOSCOPY  2013, 2016, 2019   hx colon polyps  . DILATION AND CURETTAGE OF UTERUS    . FUDUCIAL PLACEMENT Right 10/18/2020   Procedure: PLACEMENT OF FUDUCIAL;  Surgeon: Collene Gobble, MD;  Location: [facility];  Service: Thoracic;  Laterality: Right;  . SHOULDER ARTHROSCOPY WITH OPEN ROTATOR CUFF REPAIR Right 05/2009  .  TONSILLECTOMY  1977  . TUBAL LIGATION    . VIDEO BRONCHOSCOPY WITH ENDOBRONCHIAL NAVIGATION N/A 10/18/2020   Procedure: VIDEO BRONCHOSCOPY WITH ENDOBRONCHIAL NAVIGATION;  Surgeon: Collene Gobble, MD;  Location: [facility];  Service: Thoracic;  Laterality: N/A;  . VIDEO BRONCHOSCOPY WITH ENDOBRONCHIAL ULTRASOUND N/A 10/18/2020   Procedure: VIDEO BRONCHOSCOPY WITH ENDOBRONCHIAL ULTRASOUND;  Surgeon: Collene Gobble, MD;  Location: [facility] OR;  Service: Thoracic;  Laterality: N/A;  .  WISDOM TOOTH EXTRACTION      REVIEW OF SYSTEMS:   Constitutional: Negative for appetite change, chills, fatigue, fever and unexpected weight change.  HENT: Negative for mouth sores, nosebleeds, sore throat and trouble swallowing.   Eyes: Negative for eye problems and icterus.  Respiratory: Positive for mild cough. Negative for hemoptysis, shortness of breath and wheezing.   Cardiovascular: Negative for chest pain and leg swelling.  Gastrointestinal: Negative for abdominal pain, constipation, diarrhea, nausea and vomiting.  Genitourinary: Negative for bladder incontinence, difficulty urinating, dysuria, frequency and hematuria.   Musculoskeletal: Positive for occasional rt sided neck pain. Negative for back pain, gait problem, neck pain and neck stiffness.  Skin: Negative for itching and rash.  Neurological: Positive for occasional headaches. Negative for dizziness, extremity weakness, gait problem, light-headedness and seizures.  Hematological: Negative for adenopathy. Does not bruise/bleed easily.  Psychiatric/Behavioral: Negative for confusion, depression and sleep disturbance. The patient is not nervous/anxious.    PHYSICAL EXAMINATION:  Blood pressure (!) 143/77, pulse 94, temperature 98.3 F (36.8 C), temperature source Tympanic, resp. rate 14, height 5\' 8"  (1.727 m), weight 218 lb 11.2 oz (99.2 kg), SpO2 100 %.  ECOG PERFORMANCE STATUS: 1 - Symptomatic but completely ambulatory  Physical Exam  Constitutional: Oriented to person, place, and time and well-developed, well-nourished, and in no distress.  HENT:  Head: Normocephalic and atraumatic.  Mouth/Throat: Oropharynx is clear and moist. No oropharyngeal exudate.  Eyes: Conjunctivae are normal. Right eye exhibits no discharge. Left eye exhibits no discharge. No scleral icterus.  Neck: Normal range of motion. Neck supple.  Cardiovascular: Normal rate, regular rhythm, normal heart sounds and intact distal pulses.    Pulmonary/Chest: Effort normal and breath sounds normal. No respiratory distress. No wheezes. No rales.  Abdominal: Soft. Bowel sounds are normal. Exhibits no distension and no mass. There is no tenderness.  Musculoskeletal: Normal range of motion. Exhibits no edema.  Lymphadenopathy:    No cervical adenopathy.  Neurological: Alert and oriented to person, place, and time. Exhibits normal muscle tone. Gait normal. Coordination normal.  Skin: Skin is warm and dry. No rash noted. Not diaphoretic. No erythema. No pallor.  Psychiatric: Mood, memory and judgment normal.  Vitals reviewed.  LABORATORY DATA: Lab Results  Component Value Date   WBC 9.0 10/26/2020   HGB 14.6 10/26/2020   HCT 43.8 10/26/2020   MCV 93.6 10/26/2020   PLT 394 10/26/2020      Chemistry      Component Value Date/Time   NA 139 10/26/2020 0928   K 3.6 10/26/2020 0928   CL 107 10/26/2020 0928   CO2 23 10/26/2020 0928   BUN 5 (L) 10/26/2020 0928   CREATININE 0.82 10/26/2020 0928   CREATININE 0.65 06/08/2020 1050      Component Value Date/Time   CALCIUM 9.6 10/26/2020 0928   ALKPHOS 58 10/26/2020 0928   AST 19 10/26/2020 0928   ALT 23 10/26/2020 0928   BILITOT 0.2 (L) 10/26/2020 0928       RADIOGRAPHIC STUDIES:  MR Brain W Wo Contrast  Result Date: 11/06/2020 CLINICAL DATA:  Small cell lung cancer. Staging. EXAM: MRI HEAD WITHOUT AND WITH CONTRAST TECHNIQUE: Multiplanar, multiecho pulse sequences of the brain and surrounding structures were obtained without and with intravenous contrast. CONTRAST:  103mL GADAVIST GADOBUTROL 1 MMOL/ML IV SOLN COMPARISON:  None. FINDINGS: Brain: No acute infarct, hemorrhage, or mass lesion is present. No significant white matter lesions are present. The ventricles are of normal size. No significant extraaxial fluid collection is present. The internal auditory canals are within normal limits. Remote lacunar infarcts are present in the right PICA territory. Brainstem and  cerebellum are otherwise within normal limits. Vascular: Flow is present in the major intracranial arteries. Skull and upper cervical spine: The craniocervical junction is normal. Upper cervical spine is within normal limits. Marrow signal is unremarkable. Sinuses/Orbits: The paranasal sinuses and mastoid air cells are clear. The globes and orbits are within normal limits. IMPRESSION: 1. No evidence for metastatic disease to the brain or meninges. 2. Remote lacunar infarcts of the right PICA territory. Electronically Signed   By: San Morelle M.D.   On: 11/06/2020 20:09   NM PET Image Initial (PI) Skull Base To Thigh  Result Date: 11/08/2020 CLINICAL DATA:  Initial treatment strategy for small cell lung cancer. EXAM: NUCLEAR MEDICINE PET SKULL BASE TO THIGH TECHNIQUE: 11.0 mCi F-18 FDG was injected intravenously. Full-ring PET imaging was performed from the skull base to thigh after the radiotracer. CT data was obtained and used for attenuation correction and anatomic localization. Fasting blood glucose: 115 mg/dl COMPARISON:  CT chest dated 10/05/2020 FINDINGS: Mediastinal blood pool activity: SUV max 2.9 Liver activity: SUV max NA NECK: Diffuse symmetric glottic activity is likely physiologic. There is no CT correlate. No hypermetabolic adenopathy in the neck. Incidental CT findings: 0.8 cm hypodense nodule in the right thyroid gland is not hypermetabolic. Not clinically significant; no follow-up imaging recommended (ref: J Am Coll Radiol. 2015 Feb;12(2): 143-50). CHEST: A right upper lobe suprahilar nodule with maximum SUV of 17.7 measures approximately 2.9 by 1.7 cm. Posteriorly in the right upper lobe, an approximately 1.1 by 0.9 cm nodule has maximum SUV of 4.9, favoring malignancy. Right paratracheal lymph node on image 63 series 4 measures 2.3 cm in short axis and has a maximum SUV of 14.2, compatible with malignancy. Incidental CT findings: Aortic atherosclerotic calcification. Emphysema.  ABDOMEN/PELVIS: Mass of the medial limb left adrenal gland 1.3 by 1.2 cm in diameter, -10 Hounsfield units, compatible with adenoma. Mass of the lateral limb of the left adrenal gland measuring 1.5 by 1.5 cm on image 112 series for, internal density 7 Hounsfield units, compatible with adenoma. There is only low-grade associated metabolic activity. Incidental CT findings: Aortoiliac atherosclerotic vascular disease. SKELETON: No significant abnormal hypermetabolic activity in this region. Incidental CT findings: Degenerative disc disease at L5-S1. IMPRESSION: 1. Central right upper lobe suprahilar nodule/adenopathy with maximum SUV of 17.7. Hypermetabolic large right paratracheal lymph node, and hypermetabolic nodule posteriorly in the right upper lobe. No findings of contralateral or extrathoracic spread. 2. Other imaging findings of potential clinical significance: Left adrenal adenomas. Aortic Atherosclerosis (ICD10-I70.0) and Emphysema (ICD10-J43.9). Electronically Signed   By: Van Clines M.D.   On: 11/08/2020 16:37   DG Chest Port 1 View  Result Date: 10/18/2020 CLINICAL DATA:  Post right upper lobe bronchoscopy with biopsy EXAM: PORTABLE CHEST 1 VIEW COMPARISON:  09/25/2020 FINDINGS: Post biopsy changes in the upper right lung. No pneumothorax. Lung aeration is otherwise stable. No pleural effusion. Stable cardiomediastinal contours. IMPRESSION: Post  biopsy changes.  No pneumothorax. Electronically Signed   By: Macy Mis M.D.   On: 10/18/2020 12:48   DG C-ARM BRONCHOSCOPY  Result Date: 10/18/2020 C-ARM BRONCHOSCOPY: Fluoroscopy was utilized by the requesting physician.  No radiographic interpretation.     ASSESSMENT/PLAN:  This is a very pleasant 60 year old Caucasian female diagnosed with  Limited small cell lung cancer. She presented with a right upper lobe suprahilar nodule/adenopathy, right paratracheal lymph node, and a hypermetabolic right upper lobe nodule. She was diagnosed in  February 2022.   The patient recently had a PET scan performed.  Dr. Earlie Server personally and independently reviewed the results of the PET scan and the brain MRI.  Dr. Julien Nordmann had a lengthy discussion with the patient today about her current condition and recommended treatment options.  Dr. Julien Nordmann recommends standard of care chemotherapy with cisplatin 80 mg per metered squared on days 1, etoposide 100 mg/m on days 1, 2, and 3 IV every 3 weeks with concurrent radiation.  The patient is interested in this option and she is expected to receive her first dose of treatment on 11/15/20.   The adverse side effects of treatment were discussed including but not limited to myelosuppression, alopecia, nausea, vomiting, kidney, and renal dysfunction.  I will arrange for the patient to have a chemo education class prior to starting her first cycle of treatment.  I have placed a referral to radiation oncology for consideration of concurrent radiation. The patient qualifies for a research study and will meet with the research team today to discuss this.   I sent a prescription for Compazine 10 mg p.o. every 6 hours as needed for nausea to the patient's pharmacy.  We will see the patient back for follow-up visit in 2 weeks for evaluation in 1 week follow-up visit and to manage any adverse side effects of treatment.  The patient was advised to call immediately if she has any concerning symptoms in the interval. The patient voices understanding of current disease status and treatment options and is in agreement with the current care plan. All questions were answered. The patient knows to call the clinic with any problems, questions or concerns. We can certainly see the patient much sooner if necessary     Orders Placed This Encounter  Procedures  . CBC with Differential ([facility] Only)    Standing Status:   Standing    Number of Occurrences:   12    Standing Expiration Date:   11/09/2021  . CMP  ([facility] only)    Standing Status:   Standing    Number of Occurrences:   12    Standing Expiration Date:   11/09/2021  . Ambulatory referral to Radiation Oncology    Referral Priority:   Routine    Referral Type:   Consultation    Referral Reason:   Specialty Services Required    Requested Specialty:   Radiation Oncology    Number of Visits Requested:   1     I spent 40-54 minutes in this encounter  Cassandra L Heilingoetter, PA-C 11/09/20  ADDENDUM: Hematology/Oncology Attending: I had a face-to-face encounter with the patient today.  I reviewed her record, scan and recommended her care plan.  This is a very pleasant 60 years old white female recently diagnosed with limited stage (T3, N2, M0) small cell lung cancer presented with right upper lobe lung nodule, hypermetabolic nodule posteriorly in the right upper lobe in addition to right paratracheal lymphadenopathy diagnosed in February 2022.  The patient had a PET scan as well as MRI of the brain performed recently.  I personally and independently reviewed the imaging studies and discussed the results with the patient.  Her MRI of the brain showed no concerning findings for brain metastasis.  The PET scan also showed no evidence of extrathoracic metastatic disease. I had a lengthy discussion with the patient today about her condition and treatment options.  I recommended for the patient systemic chemotherapy with cisplatin 80 mg/M2 on day 1 and etoposide 100 mg/M2 on days 1, 2 and 3 every 3 weeks for a total of 4 cycles.  This will be concurrent with radiotherapy.  The patient was also given the option of clinical trial with the same regimen of chemotherapy plus minus atezolizumab versus placebo and she will think about her option. After completion of the course of concurrent chemoradiation, the patient may benefit from prophylactic cranial irradiation. I discussed with the patient the adverse effect of the chemotherapy including but  not limited to alopecia, myelosuppression, nausea and vomiting, peripheral neuropathy, liver or renal dysfunction. The patient will have a chemotherapy education class before the first dose of her treatment. She is expected to start the first dose of her treatment next week unless she is enrolled in the clinical trial then we will start the treatment once she completes the eligibility criteria. She will come back for follow-up visit in 2 weeks for evaluation and management of any adverse effect of her treatment. We will call her pharmacy with prescription for Compazine 10 mg p.o. every 6 hours as needed for nausea. The patient will see radiation oncology for discussion of the radiotherapy option. She was advised to call immediately if she has any other concerning symptoms in the interval. The total time spent in the appointment was 60 minutes.  Disclaimer: This note was dictated with voice recognition software. Similar sounding words can inadvertently be transcribed and may be missed upon review. Eilleen Kempf, MD 11/09/20

## 2020-11-07 NOTE — Telephone Encounter (Signed)
Pt called requesting to be prescribed stronger Ativan for her PET scan.  Reviewed with Cassandra, PA-C who advised pt was prescribed 4 tablets, 2 tab for each exam. Also, the PET is more open and more quiet.  I have called the pt back and relayed this information. She indicated she did not realize this and was okay with this plan. No further assistance was needed at this time.

## 2020-11-08 ENCOUNTER — Ambulatory Visit (HOSPITAL_COMMUNITY)
Admission: RE | Admit: 2020-11-08 | Discharge: 2020-11-08 | Disposition: A | Discharge status: Home / Self Care | Payer: Medicare Other | Source: Ambulatory Visit | Attending: Physician Assistant | Admitting: Physician Assistant

## 2020-11-08 ENCOUNTER — Other Ambulatory Visit: Payer: Self-pay

## 2020-11-08 ENCOUNTER — Encounter: Payer: Self-pay | Admitting: *Deleted

## 2020-11-08 ENCOUNTER — Ambulatory Visit (HOSPITAL_COMMUNITY): Admission: RE | Admit: 2020-11-08 | Payer: Medicare Other | Source: Ambulatory Visit

## 2020-11-08 DIAGNOSIS — C3490 Malignant neoplasm of unspecified part of unspecified bronchus or lung: Secondary | ICD-10-CM | POA: Diagnosis not present

## 2020-11-08 DIAGNOSIS — J439 Emphysema, unspecified: Secondary | ICD-10-CM | POA: Diagnosis not present

## 2020-11-08 DIAGNOSIS — D3502 Benign neoplasm of left adrenal gland: Secondary | ICD-10-CM | POA: Diagnosis not present

## 2020-11-08 DIAGNOSIS — I700 Atherosclerosis of aorta: Secondary | ICD-10-CM | POA: Diagnosis not present

## 2020-11-08 DIAGNOSIS — C3491 Malignant neoplasm of unspecified part of right bronchus or lung: Secondary | ICD-10-CM | POA: Diagnosis not present

## 2020-11-08 LAB — GLUCOSE, CAPILLARY: Glucose-Capillary: 115 mg/dL — ABNORMAL HIGH (ref 70–99)

## 2020-11-08 MED ORDER — FLUDEOXYGLUCOSE F - 18 (FDG) INJECTION
11.3000 | Freq: Once | INTRAVENOUS | Status: AC | PRN
  Administered 2020-11-08: 11.02 via INTRAVENOUS

## 2020-11-08 NOTE — Progress Notes (Signed)
I received an email from Dr. Tammi Klippel stating patient may be eligible for clinical trial.  She is having PET scan today and seeing Cassie tomorrow. I updated Cassie on his recommendation.

## 2020-11-09 ENCOUNTER — Other Ambulatory Visit: Payer: Self-pay | Admitting: Internal Medicine

## 2020-11-09 ENCOUNTER — Encounter: Payer: Self-pay | Admitting: Emergency Medicine

## 2020-11-09 ENCOUNTER — Other Ambulatory Visit: Payer: Self-pay

## 2020-11-09 ENCOUNTER — Encounter: Payer: Self-pay | Admitting: Physician Assistant

## 2020-11-09 ENCOUNTER — Inpatient Hospital Stay (HOSPITAL_BASED_OUTPATIENT_CLINIC_OR_DEPARTMENT_OTHER): Payer: Medicare Other | Admitting: Physician Assistant

## 2020-11-09 VITALS — BP 143/77 | HR 94 | Temp 98.3°F | Resp 14 | Ht 68.0 in | Wt 218.7 lb

## 2020-11-09 DIAGNOSIS — I509 Heart failure, unspecified: Secondary | ICD-10-CM | POA: Diagnosis not present

## 2020-11-09 DIAGNOSIS — Z7189 Other specified counseling: Secondary | ICD-10-CM | POA: Diagnosis not present

## 2020-11-09 DIAGNOSIS — E785 Hyperlipidemia, unspecified: Secondary | ICD-10-CM | POA: Diagnosis not present

## 2020-11-09 DIAGNOSIS — Z79899 Other long term (current) drug therapy: Secondary | ICD-10-CM | POA: Diagnosis not present

## 2020-11-09 DIAGNOSIS — Z5111 Encounter for antineoplastic chemotherapy: Secondary | ICD-10-CM | POA: Diagnosis not present

## 2020-11-09 DIAGNOSIS — C3490 Malignant neoplasm of unspecified part of unspecified bronchus or lung: Secondary | ICD-10-CM | POA: Diagnosis not present

## 2020-11-09 DIAGNOSIS — C3491 Malignant neoplasm of unspecified part of right bronchus or lung: Secondary | ICD-10-CM | POA: Insufficient documentation

## 2020-11-09 DIAGNOSIS — C3411 Malignant neoplasm of upper lobe, right bronchus or lung: Secondary | ICD-10-CM | POA: Diagnosis not present

## 2020-11-09 DIAGNOSIS — J439 Emphysema, unspecified: Secondary | ICD-10-CM | POA: Diagnosis not present

## 2020-11-09 DIAGNOSIS — Z86010 Personal history of colonic polyps: Secondary | ICD-10-CM | POA: Diagnosis not present

## 2020-11-09 DIAGNOSIS — Z85820 Personal history of malignant melanoma of skin: Secondary | ICD-10-CM | POA: Diagnosis not present

## 2020-11-09 DIAGNOSIS — J449 Chronic obstructive pulmonary disease, unspecified: Secondary | ICD-10-CM | POA: Diagnosis not present

## 2020-11-09 DIAGNOSIS — F17210 Nicotine dependence, cigarettes, uncomplicated: Secondary | ICD-10-CM | POA: Diagnosis not present

## 2020-11-09 HISTORY — DX: Malignant neoplasm of unspecified part of unspecified bronchus or lung: C34.90

## 2020-11-09 MED ORDER — PROCHLORPERAZINE MALEATE 10 MG PO TABS: 10.0000 mg | ORAL_TABLET | Freq: Four times a day (QID) | ORAL | 2 refills | Status: DC | PRN

## 2020-11-09 NOTE — Research (Signed)
Trial: [id number]-Limited Stage Small Cell Lung Cancer (LS-SCLC): A Phase II/III Randomized Study of Chemoradiation Versus Chemoradiation Plus Atezolizumab  Patient Susan Cortez was identified by PA Cassie Heilengoetter as a potential candidate for the above listed study.  This Clinical Research Nurse along with Clinical Research Nurse Wilber Bihari met with Susan Cortez, [id number], on 11/09/20 in a manner and location that ensures patient privacy to discuss participation in the above listed research study.  Patient is Unaccompanied.  A copy of the informed consent document and separate HIPAA Authorization was provided to the patient.  Patient reads, speaks, and understands [place].   Patient was provided with the business card of Clinical Research Nurse Wilber Bihari and encouraged to contact the research team with any questions.  Approximately 15 minutes were spent with the patient reviewing the informed consent documents.  Patient was provided the option of taking informed consent documents home to review and was encouraged to review at their convenience with their support network, including other care providers. Patient took the consent documents home to review.  This Research Nurse will follow up with patient in the next few days to confirm potential interest in study participation.  Susan Cortez '[place], RN, BSN Clinical Research Nurse 11/09/2020, 12:36

## 2020-11-09 NOTE — Progress Notes (Signed)
START ON PATHWAY REGIMEN - Small Cell Lung     A cycle is every 21 days:     Etoposide      Cisplatin   **Always confirm dose/schedule in your pharmacy ordering system**  Patient Characteristics: Newly Diagnosed, Preoperative or Nonsurgical Candidate (Clinical Staging), First Line, Limited Stage, Nonsurgical Candidate Therapeutic Status: Newly Diagnosed, Preoperative or Nonsurgical Candidate (Clinical Staging) AJCC T Category: cT1b AJCC N Category: cN3 AJCC M Category: cM0 AJCC 8 Stage Grouping: IIIB Stage Classification: Limited Surgical Candidacy: Nonsurgical Candidate Intent of Therapy: Curative Intent, Discussed with Patient

## 2020-11-09 NOTE — Patient Instructions (Signed)
-  There are two main categories of lung cancer, they are named based on the size of the cancer cell. One is called Non-Small cell lung cancer. The other type is Small Cell Lung Cancer -The sample (biopsy) that they took of your tumor was consistent with Small Cell Lung Cancer -We covered a lot of important information at your appointment today regarding what the treatment plan is moving forward. Here are the the main points that were discussed at your office visit with [place] today:  -The treatment that you will receive consists of Two chemotherapy drugs, called Cisplatin/Carboplatin and Etoposide.  -We are planning on starting your treatment next week on 11/15/20 but before your start your treatment, I would like you to attend a Chemotherapy Education Class. This involves having you sit down with one of our nurse educators. She will discuss with your one-on-one more details about your treatment as well as general information about resources here at the [facility] treatment will be given for three consecutive days every 3 weeks. We will check your labs once a week just to make sure that important components of your blood are in an acceptable range. We would do these three days in a row every 3 weeks for a total of somewhere between 4 treatments  -We will get a CT scan after 2 treatments to check on the progress of treatment  Medications:  -I have sent a few important medication prescriptions to your pharmacy.  -Compazine was sent to your pharmacy. This medication is for nausea. You may take this every 6 hours as needed if you feel nausous. .   Side Effects:  -The adverse effect of this treatment including but not limited to alopecia (losing your hair), myelosuppression (drops in the blood counts), nausea and vomiting, peripheral neuropathy (numbness and tingling in the hands and feet), hearing deficit, liver or renal dysfunction.   Referrals:  -I have sent a referral to radiation oncology so  they can discuss with you the role of radiation treatment in addition to chemotherapy.   Follow up:  -We will see you back for a follow up visit in 2 weeks for a 1 week follow up visit and to manage any adverse side effects of treatment.

## 2020-11-10 ENCOUNTER — Telehealth: Payer: Self-pay

## 2020-11-10 ENCOUNTER — Telehealth: Payer: Self-pay | Admitting: Emergency Medicine

## 2020-11-10 NOTE — Telephone Encounter (Signed)
Pt left a message wanting to know if she can take her Tylenol #4 with Cisplatin.   Discussed with Cassandra, PA-C who advised it is okay for the pt to continue her rx.  I have called the pt back and advised as indicated. Pt expressed understanding.

## 2020-11-10 NOTE — Telephone Encounter (Signed)
Called pt regarding study eligibility for [id number].  Pt was not eligible per protocol, pt made aware.  Pt states that she was not interested in study anymore anyway and was agreeable to move forward with standard of care treatment as planned.  NP Cassie for MD Mohamed's office made aware.  Pt denies any further questions or concerns at this time.

## 2020-11-11 ENCOUNTER — Other Ambulatory Visit: Payer: Self-pay | Admitting: Family Medicine

## 2020-11-11 ENCOUNTER — Ambulatory Visit (HOSPITAL_COMMUNITY): Payer: Medicare Other

## 2020-11-11 DIAGNOSIS — R350 Frequency of micturition: Secondary | ICD-10-CM

## 2020-11-11 NOTE — Progress Notes (Signed)
Pharmacist Chemotherapy Monitoring - Initial Assessment    Anticipated start date: 11/18/2020   Regimen:  . Are orders appropriate based on the patient's diagnosis, regimen, and cycle? Yes . Does the plan date match the patient's scheduled date? Yes . Is the sequencing of drugs appropriate? Yes . Are the premedications appropriate for the patient's regimen? Yes . Prior Authorization for treatment is: Approved o If applicable, is the correct biosimilar selected based on the patient's insurance? no  Organ Function and Labs: Marland Kitchen Are dose adjustments needed based on the patient's renal function, hepatic function, or hematologic function? Yes . Are appropriate labs ordered prior to the start of patient's treatment? Yes . Other organ system assessment, if indicated: N/A . The following baseline labs, if indicated, have been ordered: N/A  Dose Assessment: . Are the drug doses appropriate? Yes . Are the following correct: o Drug concentrations Yes o IV fluid compatible with drug Yes o Administration routes Yes o Timing of therapy Yes . If applicable, does the patient have documented access for treatment and/or plans for port-a-cath placement? no . If applicable, have lifetime cumulative doses been properly documented and assessed? yes Lifetime Dose Tracking  No doses have been documented on this patient for the following tracked chemicals: Doxorubicin, Epirubicin, Idarubicin, Daunorubicin, Mitoxantrone, Bleomycin, Oxaliplatin, Carboplatin, Liposomal Doxorubicin  o   Toxicity Monitoring/Prevention: . The patient has the following take home antiemetics prescribed: Prochlorperazine and Lorazepam . The patient has the following take home medications prescribed: N/A . Medication allergies and previous infusion related reactions, if applicable, have been reviewed and addressed. No . The patient's current medication list has been assessed for drug-drug interactions with their chemotherapy regimen.  no significant drug-drug interactions were identified on review.  Order Review: . Are the treatment plan orders signed? Yes . Is the patient scheduled to see a provider prior to their treatment? Yes  I verify that I have reviewed each item in the above checklist and answered each question accordingly.  Larene Beach, [place], 11/11/2020  3:21 PM

## 2020-11-14 ENCOUNTER — Inpatient Hospital Stay: Payer: Medicare Other

## 2020-11-14 ENCOUNTER — Other Ambulatory Visit: Payer: Self-pay

## 2020-11-14 NOTE — Progress Notes (Signed)
Thoracic Location of Tumor / Histology: Small Cell Lung Cancer- RUL suprahilar nodule/adenopathy, right paratrcheal node, and a hypermetabolic right upper lobe nodule.  Patient presented   PET 11/08/2020: Central right upper lobe suprahilar nodule/adenopathy with maximum SUV of 17.7. Hypermetabolic large right paratracheal lymph node, and hypermetabolic nodule posteriorly in the right upper lobe. No findings of contralateral or extrathoracic spread.  MRI Brain 11/05/2020: No evidence for metastatic disease to the brain or meninges.  Remote lacunar infarcts of the right PICA territory.  CT Chest 10/05/2020: Progression of mediastinal and right hilar lymphadenopathy.  Branching nodular lesion in the right upper lobe posteriorly has increased in size and could reflect endobronchial tumor.  Stable emphysematous changes and pulmonary scarring.  Stable left adrenal gland adenoma.  Biopsies of Right Upper Lobe Lung and Lymph Nodes 10/18/2020    Tobacco/Marijuana/Snuff/ETOH use: Current Smoker  Past/Anticipated interventions by cardiothoracic surgery, if any:   Past/Anticipated interventions by medical oncology, if any:  Dr. Thad Ranger Heilingoetter 11/09/2020 - Recommend standard of care chemotherapy with cisplatin 80 mg per metered squared on days 1, etoposide 100 mg/m on days 1, 2, and 3 IV every 3 weeks with concurrent radiation. -The patient is interested in this option and she is expected to receive her first dose of treatment on 11/15/20.  - I have placed a referral to radiation oncology for consideration of concurrent radiation.   Signs/Symptoms  Weight changes, if any: Stable  Respiratory complaints, if any: States SOB is gone.  Hemoptysis, if any: Productive cough with clear mucous on occasion.  Pain issues, if any:  None  SAFETY ISSUES:  Prior radiation? No  Pacemaker/ICD? No  Possible current pregnancy? Postmenopausal  Is the patient on methotrexate? No  Current  Complaints / other details:

## 2020-11-15 ENCOUNTER — Other Ambulatory Visit: Payer: Self-pay | Admitting: Medical Oncology

## 2020-11-15 ENCOUNTER — Ambulatory Visit
Admission: RE | Admit: 2020-11-15 | Discharge: 2020-11-15 | Disposition: A | Discharge status: Home / Self Care | Payer: Medicare Other | Source: Ambulatory Visit | Attending: Radiation Oncology | Admitting: Radiation Oncology

## 2020-11-15 ENCOUNTER — Inpatient Hospital Stay: Payer: Medicare Other

## 2020-11-15 ENCOUNTER — Encounter: Payer: Self-pay | Admitting: General Practice

## 2020-11-15 ENCOUNTER — Other Ambulatory Visit: Payer: Self-pay

## 2020-11-15 ENCOUNTER — Encounter: Payer: Self-pay | Admitting: Radiation Oncology

## 2020-11-15 ENCOUNTER — Other Ambulatory Visit: Payer: Medicare Other

## 2020-11-15 VITALS — BP 125/79 | HR 77 | Temp 98.8°F | Resp 18 | Ht 68.0 in | Wt 218.5 lb

## 2020-11-15 VITALS — BP 137/63 | HR 75 | Temp 97.9°F | Resp 20 | Ht 68.0 in | Wt 222.6 lb

## 2020-11-15 DIAGNOSIS — Z5111 Encounter for antineoplastic chemotherapy: Secondary | ICD-10-CM | POA: Diagnosis not present

## 2020-11-15 DIAGNOSIS — M129 Arthropathy, unspecified: Secondary | ICD-10-CM | POA: Insufficient documentation

## 2020-11-15 DIAGNOSIS — F17210 Nicotine dependence, cigarettes, uncomplicated: Secondary | ICD-10-CM | POA: Insufficient documentation

## 2020-11-15 DIAGNOSIS — C3490 Malignant neoplasm of unspecified part of unspecified bronchus or lung: Secondary | ICD-10-CM

## 2020-11-15 DIAGNOSIS — I878 Other specified disorders of veins: Secondary | ICD-10-CM

## 2020-11-15 DIAGNOSIS — E785 Hyperlipidemia, unspecified: Secondary | ICD-10-CM | POA: Diagnosis not present

## 2020-11-15 DIAGNOSIS — C3491 Malignant neoplasm of unspecified part of right bronchus or lung: Secondary | ICD-10-CM

## 2020-11-15 DIAGNOSIS — R599 Enlarged lymph nodes, unspecified: Secondary | ICD-10-CM | POA: Diagnosis not present

## 2020-11-15 DIAGNOSIS — Z79899 Other long term (current) drug therapy: Secondary | ICD-10-CM | POA: Diagnosis not present

## 2020-11-15 DIAGNOSIS — Z85828 Personal history of other malignant neoplasm of skin: Secondary | ICD-10-CM | POA: Diagnosis not present

## 2020-11-15 DIAGNOSIS — Z86010 Personal history of colonic polyps: Secondary | ICD-10-CM | POA: Diagnosis not present

## 2020-11-15 DIAGNOSIS — Z85820 Personal history of malignant melanoma of skin: Secondary | ICD-10-CM | POA: Diagnosis not present

## 2020-11-15 DIAGNOSIS — J439 Emphysema, unspecified: Secondary | ICD-10-CM | POA: Diagnosis not present

## 2020-11-15 DIAGNOSIS — I509 Heart failure, unspecified: Secondary | ICD-10-CM | POA: Diagnosis not present

## 2020-11-15 DIAGNOSIS — C3411 Malignant neoplasm of upper lobe, right bronchus or lung: Secondary | ICD-10-CM | POA: Diagnosis not present

## 2020-11-15 DIAGNOSIS — J449 Chronic obstructive pulmonary disease, unspecified: Secondary | ICD-10-CM | POA: Diagnosis not present

## 2020-11-15 LAB — CBC WITH DIFFERENTIAL (CANCER CENTER ONLY)
Abs Immature Granulocytes: 0.03 10*3/uL (ref 0.00–0.07)
Basophils Absolute: 0.1 10*3/uL (ref 0.0–0.1)
Basophils Relative: 1 %
Eosinophils Absolute: 0.2 10*3/uL (ref 0.0–0.5)
Eosinophils Relative: 2 %
HCT: 45.1 % (ref 36.0–46.0)
Hemoglobin: 15.2 g/dL — ABNORMAL HIGH (ref 12.0–15.0)
Immature Granulocytes: 0 %
Lymphocytes Relative: 37 %
Lymphs Abs: 3.4 10*3/uL (ref 0.7–4.0)
MCH: 31.3 pg (ref 26.0–34.0)
MCHC: 33.7 g/dL (ref 30.0–36.0)
MCV: 93 fL (ref 80.0–100.0)
Monocytes Absolute: 0.5 10*3/uL (ref 0.1–1.0)
Monocytes Relative: 6 %
Neutro Abs: 4.9 10*3/uL (ref 1.7–7.7)
Neutrophils Relative %: 54 %
Platelet Count: 341 10*3/uL (ref 150–400)
RBC: 4.85 MIL/uL (ref 3.87–5.11)
RDW: 12.9 % (ref 11.5–15.5)
WBC Count: 9.1 10*3/uL (ref 4.0–10.5)
nRBC: 0 % (ref 0.0–0.2)

## 2020-11-15 LAB — CMP (CANCER CENTER ONLY)
ALT: 20 U/L (ref 0–44)
AST: 18 U/L (ref 15–41)
Albumin: 4.1 g/dL (ref 3.5–5.0)
Alkaline Phosphatase: 54 U/L (ref 38–126)
Anion gap: 12 (ref 5–15)
BUN: 8 mg/dL (ref 8–23)
CO2: 22 mmol/L (ref 22–32)
Calcium: 9.2 mg/dL (ref 8.9–10.3)
Chloride: 106 mmol/L (ref 98–111)
Creatinine: 0.81 mg/dL (ref 0.44–1.00)
GFR, Estimated: >60 mL/min (ref >60)
Glucose, Bld: 126 mg/dL — ABNORMAL HIGH (ref 70–99)
Potassium: 4 mmol/L (ref 3.5–5.1)
Sodium: 140 mmol/L (ref 135–145)
Total Bilirubin: 0.7 mg/dL (ref 0.3–1.2)
Total Protein: 7.1 g/dL (ref 6.5–8.1)

## 2020-11-15 MED ORDER — SODIUM CHLORIDE 0.9 % IV SOLN
80.0000 mg/m2 | Freq: Once | INTRAVENOUS | Status: AC
  Administered 2020-11-15: 174 mg via INTRAVENOUS
  Filled 2020-11-15: qty 174

## 2020-11-15 MED ORDER — SODIUM CHLORIDE 0.9 % IV SOLN
Freq: Once | INTRAVENOUS | Status: AC
Start: 2020-11-15 — End: 2020-11-15
  Filled 2020-11-15: qty 250

## 2020-11-15 MED ORDER — PALONOSETRON HCL INJECTION 0.25 MG/5ML
INTRAVENOUS | Status: AC
  Filled 2020-11-15: qty 5

## 2020-11-15 MED ORDER — SODIUM CHLORIDE 0.9 % IV SOLN: Freq: Once | INTRAVENOUS | Status: DC

## 2020-11-15 MED ORDER — SODIUM CHLORIDE 0.9 % IV SOLN
100.0000 mg/m2 | Freq: Once | INTRAVENOUS | Status: AC
  Administered 2020-11-15: 220 mg via INTRAVENOUS
  Filled 2020-11-15: qty 11

## 2020-11-15 MED ORDER — MAGNESIUM SULFATE 2 GM/50ML IV SOLN
2.0000 g | Freq: Once | INTRAVENOUS | Status: AC
  Administered 2020-11-15: 2 g via INTRAVENOUS

## 2020-11-15 MED ORDER — POTASSIUM CHLORIDE IN NACL 20-0.9 MEQ/L-% IV SOLN
Freq: Once | INTRAVENOUS | Status: AC
  Filled 2020-11-15: qty 1000

## 2020-11-15 MED ORDER — PALONOSETRON HCL INJECTION 0.25 MG/5ML
0.2500 mg | Freq: Once | INTRAVENOUS | Status: AC
  Administered 2020-11-15: 0.25 mg via INTRAVENOUS

## 2020-11-15 MED ORDER — SODIUM CHLORIDE 0.9 % IV SOLN
10.0000 mg | Freq: Once | INTRAVENOUS | Status: AC
  Administered 2020-11-15: 10 mg via INTRAVENOUS
  Filled 2020-11-15: qty 10

## 2020-11-15 MED ORDER — SODIUM CHLORIDE 0.9 % IV SOLN
150.0000 mg | Freq: Once | INTRAVENOUS | Status: AC
  Administered 2020-11-15: 150 mg via INTRAVENOUS
  Filled 2020-11-15: qty 150

## 2020-11-15 MED ORDER — MAGNESIUM SULFATE 2 GM/50ML IV SOLN
INTRAVENOUS | Status: AC
  Filled 2020-11-15: qty 50

## 2020-11-15 NOTE — Progress Notes (Signed)
Radiation Oncology         ([phone]) [phone] ________________________________  Name: Susan Cortez        MRN: [id number]  Date of Service: 11/15/2020 DOB: 1961/01/07  JO:ACZYSAY, Gay Filler, MD  Curt Bears, MD     REFERRING PHYSICIAN: Curt Bears, MD   DIAGNOSIS: The encounter diagnosis was Small cell carcinoma of right lung [facility]).   HISTORY OF PRESENT ILLNESS: Susan Cortez is a 60 y.o. female seen at the request of Dr. Julien Nordmann for a history of small cell carcinoma.  The patient was recently diagnosed with limited stage small cell carcinoma of the right upper lobe involving suprahilar nodule/adenopathy and right paractracheal adenopathy. A biopsy and cytology on 10/18/20 showed malignant cells in the right upper lobe brushings and fine-needle aspirate, atypical cells were seen in the 11 R node by fine-needle aspiration and malignant cells consistent with small cell carcinoma were seen in the level 4R and 10 R nodes as well.  Her case has been discussed in multidisciplinary thoracic oncology conference and she is planning to begin chemotherapy on 11/15/2020.  She is seen today to discuss treatment recommendations of chemoradiation.   PREVIOUS RADIATION THERAPY: No   PAST MEDICAL HISTORY:  Past Medical History:  Diagnosis Date  . Allergy   . Arthritis   . Basal cell carcinoma 2018   Removed from Right neck per patient   . Diverticulosis 2013  . HLD (hyperlipidemia)   . Hx of adenomatous colonic polyps 2013  . OAB (overactive bladder)   . Pre-diabetes   . Small cell lung cancer ([place]) 11/09/2020       PAST SURGICAL HISTORY: Past Surgical History:  Procedure Laterality Date  . CESAREAN SECTION     X 3  . COLONOSCOPY  2013, 2016, 2019   hx colon polyps  . DILATION AND CURETTAGE OF UTERUS    . FUDUCIAL PLACEMENT Right 10/18/2020   Procedure: PLACEMENT OF FUDUCIAL;  Surgeon: Collene Gobble, MD;  Location: [facility];  Service: Thoracic;  Laterality: Right;  . SHOULDER  ARTHROSCOPY WITH OPEN ROTATOR CUFF REPAIR Right 05/2009  . TONSILLECTOMY  1977  . TUBAL LIGATION    . VIDEO BRONCHOSCOPY WITH ENDOBRONCHIAL NAVIGATION N/A 10/18/2020   Procedure: VIDEO BRONCHOSCOPY WITH ENDOBRONCHIAL NAVIGATION;  Surgeon: Collene Gobble, MD;  Location: [facility] OR;  Service: Thoracic;  Laterality: N/A;  . VIDEO BRONCHOSCOPY WITH ENDOBRONCHIAL ULTRASOUND N/A 10/18/2020   Procedure: VIDEO BRONCHOSCOPY WITH ENDOBRONCHIAL ULTRASOUND;  Surgeon: Collene Gobble, MD;  Location: [facility] OR;  Service: Thoracic;  Laterality: N/A;  . WISDOM TOOTH EXTRACTION       FAMILY HISTORY:  Family History  Problem Relation Age of Onset  . Diabetes Father   . Heart disease Father   . Hyperlipidemia Father   . Colon cancer Neg Hx   . Colon polyps Neg Hx   . Esophageal cancer Neg Hx   . Rectal cancer Neg Hx   . Stomach cancer Neg Hx      SOCIAL HISTORY:  reports that she has been smoking cigarettes. She has a 45.00 pack-year smoking history. She has never used smokeless tobacco. She reports that she does not drink alcohol and does not use drugs. The patient is divorced and lives in [place]. She is originally from [place]. She enjoys seeing family.   ALLERGIES: Lyrica [pregabalin], Butrans [buprenorphine], Chantix [varenicline], Diclofenac, Meloxicam, Tolmetin, and Nsaids   MEDICATIONS:  Current Outpatient Medications  Medication Sig Dispense Refill  . acetaminophen-codeine (TYLENOL #  4) 300-60 MG tablet Take 1 tablet by mouth daily. 30 tablet 2  . CALCIUM PO Take 650 mg by mouth daily.    . Cholecalciferol (VITAMIN D3) 125 MCG (5000 UT) CAPS Take 5,000 Units by mouth daily.    Marland Kitchen LORazepam (ATIVAN) 0.5 MG tablet Take 1 tablet (0.5 mg total) by mouth every 8 (eight) hours. 4 tablet 0  . lovastatin (MEVACOR) 20 MG tablet TAKE 1 TABLET(20 MG) BY MOUTH AT BEDTIME (Patient taking differently: Take 20 mg by mouth at bedtime.) 90 tablet 3  . MYRBETRIQ 50 MG TB24 tablet TAKE 1 TABLET BY MOUTH EVERY DAY  30 tablet 0  . prochlorperazine (COMPAZINE) 10 MG tablet Take 1 tablet (10 mg total) by mouth every 6 (six) hours as needed. 30 tablet 2   No current facility-administered medications for this encounter.     REVIEW OF SYSTEMS: On review of systems, the patient reports that she is feeling pretty good overall. She denies any active shortness of breath at this time, and denies unintended weight changes. She does have productive cough with clear mucous but no daily, and denies hemoptysis. No complaints of new pain are noted. No other complaints are verbalized.    PHYSICAL EXAM:  Wt Readings from Last 3 Encounters:  11/15/20 222 lb 9.6 oz (101 kg)  11/15/20 218 lb 8 oz (99.1 kg)  11/09/20 218 lb 11.2 oz (99.2 kg)   Temp Readings from Last 3 Encounters:  11/15/20 97.9 F (36.6 C)  11/15/20 98.8 F (37.1 C) (Oral)  11/09/20 98.3 F (36.8 C) (Tympanic)   BP Readings from Last 3 Encounters:  11/15/20 137/63  11/15/20 125/79  11/09/20 (!) 143/77   Pulse Readings from Last 3 Encounters:  11/15/20 75  11/15/20 77  11/09/20 94   Pain Assessment Pain Score: 0-No pain/10  In general this is a well appearing caucasian female in no acute distress. She's alert and oriented x4 and appropriate throughout the examination. Cardiopulmonary assessment is negative for acute distress and she exhibits normal effort.     ECOG = 0  0 - Asymptomatic (Fully active, able to carry on all predisease activities without restriction)  1 - Symptomatic but completely ambulatory (Restricted in physically strenuous activity but ambulatory and able to carry out work of a light or sedentary nature. For example, light housework, office work)  2 - Symptomatic, <50% in bed during the day (Ambulatory and capable of all self care but unable to carry out any work activities. Up and about more than 50% of waking hours)  3 - Symptomatic, >50% in bed, but not bedbound (Capable of only limited self-care, confined to bed  or chair 50% or more of waking hours)  4 - Bedbound (Completely disabled. Cannot carry on any self-care. Totally confined to bed or chair)  5 - Death   Eustace Pen MM, Creech RH, Tormey DC, et al. [phone]). "Toxicity and response criteria of the [facility] Group". [place] Oncol. 5 (6): 649-55    LABORATORY DATA:  Lab Results  Component Value Date   WBC 9.1 11/15/2020   HGB 15.2 (H) 11/15/2020   HCT 45.1 11/15/2020   MCV 93.0 11/15/2020   PLT 341 11/15/2020   Lab Results  Component Value Date   NA 140 11/15/2020   K 4.0 11/15/2020   CL 106 11/15/2020   CO2 22 11/15/2020   Lab Results  Component Value Date   ALT 20 11/15/2020   AST 18 11/15/2020   ALKPHOS  54 11/15/2020   BILITOT 0.7 11/15/2020      RADIOGRAPHY: MR Brain W Wo Contrast  Result Date: 11/06/2020 CLINICAL DATA:  Small cell lung cancer. Staging. EXAM: MRI HEAD WITHOUT AND WITH CONTRAST TECHNIQUE: Multiplanar, multiecho pulse sequences of the brain and surrounding structures were obtained without and with intravenous contrast. CONTRAST:  91mL GADAVIST GADOBUTROL 1 MMOL/ML IV SOLN COMPARISON:  None. FINDINGS: Brain: No acute infarct, hemorrhage, or mass lesion is present. No significant white matter lesions are present. The ventricles are of normal size. No significant extraaxial fluid collection is present. The internal auditory canals are within normal limits. Remote lacunar infarcts are present in the right PICA territory. Brainstem and cerebellum are otherwise within normal limits. Vascular: Flow is present in the major intracranial arteries. Skull and upper cervical spine: The craniocervical junction is normal. Upper cervical spine is within normal limits. Marrow signal is unremarkable. Sinuses/Orbits: The paranasal sinuses and mastoid air cells are clear. The globes and orbits are within normal limits. IMPRESSION: 1. No evidence for metastatic disease to the brain or meninges. 2. Remote lacunar infarcts  of the right PICA territory. Electronically Signed   By: San Morelle M.D.   On: 11/06/2020 20:09   NM PET Image Initial (PI) Skull Base To Thigh  Result Date: 11/08/2020 CLINICAL DATA:  Initial treatment strategy for small cell lung cancer. EXAM: NUCLEAR MEDICINE PET SKULL BASE TO THIGH TECHNIQUE: 11.0 mCi F-18 FDG was injected intravenously. Full-ring PET imaging was performed from the skull base to thigh after the radiotracer. CT data was obtained and used for attenuation correction and anatomic localization. Fasting blood glucose: 115 mg/dl COMPARISON:  CT chest dated 10/05/2020 FINDINGS: Mediastinal blood pool activity: SUV max 2.9 Liver activity: SUV max NA NECK: Diffuse symmetric glottic activity is likely physiologic. There is no CT correlate. No hypermetabolic adenopathy in the neck. Incidental CT findings: 0.8 cm hypodense nodule in the right thyroid gland is not hypermetabolic. Not clinically significant; no follow-up imaging recommended (ref: J Am Coll Radiol. 2015 Feb;12(2): 143-50). CHEST: A right upper lobe suprahilar nodule with maximum SUV of 17.7 measures approximately 2.9 by 1.7 cm. Posteriorly in the right upper lobe, an approximately 1.1 by 0.9 cm nodule has maximum SUV of 4.9, favoring malignancy. Right paratracheal lymph node on image 63 series 4 measures 2.3 cm in short axis and has a maximum SUV of 14.2, compatible with malignancy. Incidental CT findings: Aortic atherosclerotic calcification. Emphysema. ABDOMEN/PELVIS: Mass of the medial limb left adrenal gland 1.3 by 1.2 cm in diameter, -10 Hounsfield units, compatible with adenoma. Mass of the lateral limb of the left adrenal gland measuring 1.5 by 1.5 cm on image 112 series for, internal density 7 Hounsfield units, compatible with adenoma. There is only low-grade associated metabolic activity. Incidental CT findings: Aortoiliac atherosclerotic vascular disease. SKELETON: No significant abnormal hypermetabolic activity in this  region. Incidental CT findings: Degenerative disc disease at L5-S1. IMPRESSION: 1. Central right upper lobe suprahilar nodule/adenopathy with maximum SUV of 17.7. Hypermetabolic large right paratracheal lymph node, and hypermetabolic nodule posteriorly in the right upper lobe. No findings of contralateral or extrathoracic spread. 2. Other imaging findings of potential clinical significance: Left adrenal adenomas. Aortic Atherosclerosis (ICD10-I70.0) and Emphysema (ICD10-J43.9). Electronically Signed   By: Van Clines M.D.   On: 11/08/2020 16:37   DG Chest Port 1 View  Result Date: 10/18/2020 CLINICAL DATA:  Post right upper lobe bronchoscopy with biopsy EXAM: PORTABLE CHEST 1 VIEW COMPARISON:  09/25/2020 FINDINGS: Post biopsy changes in  the upper right lung. No pneumothorax. Lung aeration is otherwise stable. No pleural effusion. Stable cardiomediastinal contours. IMPRESSION: Post biopsy changes.  No pneumothorax. Electronically Signed   By: Macy Mis M.D.   On: 10/18/2020 12:48   DG C-ARM BRONCHOSCOPY  Result Date: 10/18/2020 C-ARM BRONCHOSCOPY: Fluoroscopy was utilized by the requesting physician.  No radiographic interpretation.       IMPRESSION/PLAN: 1. Limited Stage Small Cell Carcinoma of the RUL. Dr. Lisbeth Renshaw discusses the pathology findings and reviews the nature of limited stage small cell disease. We discussed the risks, benefits, short, and long term effects of radiotherapy, as well as the curative intent, and the patient is interested in proceeding. Dr. Lisbeth Renshaw discusses the delivery and logistics of radiotherapy and anticipates a course of 6 1/2 weeks of radiotherapy. Written consent is obtained and placed in the chart, a copy was provided to the patient. She will simulate tomorrow morning at 8:30 am and begin treatment early next week. She just started chemotherapy as well today.  2. PCI. We discussed the rationale as well at the appropriate time upon completion of chemotherapy of  the role for prophylactic cranial irradiation. She is in agreement to proceed with this at the appropriate time. We would repeat an MRI of the brain prior to proceeding.   In a visit lasting 60 minutes, greater than 50% of the time was spent face to face discussing the patient's condition, in preparation for the discussion, and coordinating the patient's care.   The above documentation reflects my direct findings during this shared patient visit. Please see the separate note by Dr. Lisbeth Renshaw on this date for the remainder of the patient's plan of care.    Carola Rhine, [facility]   **Disclaimer: This note was dictated with voice recognition software. Similar sounding words can inadvertently be transcribed and this note may contain transcription errors which may not have been corrected upon publication of note.**

## 2020-11-15 NOTE — Patient Instructions (Signed)
[facility] Discharge Instructions for Patients Receiving Chemotherapy  Today you received the following chemotherapy agents Cisplatin, Etoposide  To help prevent nausea and vomiting after your treatment, we encourage you to take your nausea medication as directed.   If you develop nausea and vomiting that is not controlled by your nausea medication, call the clinic.   BELOW ARE SYMPTOMS THAT SHOULD BE REPORTED IMMEDIATELY:  *FEVER GREATER THAN 100.5 F  *CHILLS WITH OR WITHOUT FEVER  NAUSEA AND VOMITING THAT IS NOT CONTROLLED WITH YOUR NAUSEA MEDICATION  *UNUSUAL SHORTNESS OF BREATH  *UNUSUAL BRUISING OR BLEEDING  TENDERNESS IN MOUTH AND THROAT WITH OR WITHOUT PRESENCE OF ULCERS  *URINARY PROBLEMS  *BOWEL PROBLEMS  UNUSUAL RASH Items with * indicate a potential emergency and should be followed up as soon as possible.  Feel free to call the clinic should you have any questions or concerns. The clinic phone number is [phone].  Please show the [facility] at check-in to the Emergency Department and triage nurse  Cisplatin injection What is this medicine? CISPLATIN (SIS pla tin) is a chemotherapy drug. It targets fast dividing cells, like cancer cells, and causes these cells to die. This medicine is used to treat many types of cancer like bladder, ovarian, and testicular cancers. This medicine may be used for other purposes; ask your health care provider or pharmacist if you have questions. COMMON BRAND NAME(S): Platinol, Platinol -AQ What should I tell my health care provider before I take this medicine? They need to know if you have any of these conditions:  eye disease, vision problems  hearing problems  kidney disease  low blood counts, like white cells, platelets, or red blood cells  tingling of the fingers or toes, or other nerve disorder  an unusual or allergic reaction to cisplatin, carboplatin, oxaliplatin, other medicines, foods,  dyes, or preservatives  pregnant or trying to get pregnant  breast-feeding How should I use this medicine? This drug is given as an infusion into a vein. It is administered in a hospital or clinic by a specially trained health care professional. Talk to your pediatrician regarding the use of this medicine in children. Special care may be needed. Overdosage: If you think you have taken too much of this medicine contact a poison control center or emergency room at once. NOTE: This medicine is only for you. Do not share this medicine with others. What if I miss a dose? It is important not to miss a dose. Call your doctor or health care professional if you are unable to keep an appointment. What may interact with this medicine? This medicine may interact with the following medications:  foscarnet  certain antibiotics like amikacin, gentamicin, neomycin, polymyxin B, streptomycin, tobramycin, vancomycin This list may not describe all possible interactions. Give your health care provider a list of all the medicines, herbs, non-prescription drugs, or dietary supplements you use. Also tell them if you smoke, drink alcohol, or use illegal drugs. Some items may interact with your medicine. What should I watch for while using this medicine? Your condition will be monitored carefully while you are receiving this medicine. You will need important blood work done while you are taking this medicine. This drug may make you feel generally unwell. This is not uncommon, as chemotherapy can affect healthy cells as well as cancer cells. Report any side effects. Continue your course of treatment even though you feel ill unless your doctor tells you to stop. This medicine may increase  your risk of getting an infection. Call your healthcare professional for advice if you get a fever, chills, or sore throat, or other symptoms of a cold or flu. Do not treat yourself. Try to avoid being around people who are sick. Avoid  taking medicines that contain aspirin, acetaminophen, ibuprofen, naproxen, or ketoprofen unless instructed by your healthcare professional. These medicines may hide a fever. This medicine may increase your risk to bruise or bleed. Call your doctor or health care professional if you notice any unusual bleeding. Be careful brushing and flossing your teeth or using a toothpick because you may get an infection or bleed more easily. If you have any dental work done, tell your dentist you are receiving this medicine. Do not become pregnant while taking this medicine or for 14 months after stopping it. Women should inform their healthcare professional if they wish to become pregnant or think they might be pregnant. Men should not father a child while taking this medicine and for 11 months after stopping it. There is potential for serious side effects to an unborn child. Talk to your healthcare professional for more information. Do not breast-feed an infant while taking this medicine. This medicine has caused ovarian failure in some women. This medicine may make it more difficult to get pregnant. Talk to your healthcare professional if you are concerned about your fertility. This medicine has caused decreased sperm counts in some men. This may make it more difficult to father a child. Talk to your healthcare professional if you are concerned about your fertility. Drink fluids as directed while you are taking this medicine. This will help protect your kidneys. Call your doctor or health care professional if you get diarrhea. Do not treat yourself. What side effects may I notice from receiving this medicine? Side effects that you should report to your doctor or health care professional as soon as possible:  allergic reactions like skin rash, itching or hives, swelling of the face, lips, or tongue  blurred vision  changes in vision  decreased hearing or ringing of the ears  nausea, vomiting  pain,  redness, or irritation at site where injected  pain, tingling, numbness in the hands or feet  signs and symptoms of bleeding such as bloody or black, tarry stools; red or dark brown urine; spitting up blood or brown material that looks like coffee grounds; red spots on the skin; unusual bruising or bleeding from the eyes, gums, or nose  signs and symptoms of infection like fever; chills; cough; sore throat; pain or trouble passing urine  signs and symptoms of kidney injury like trouble passing urine or change in the amount of urine  signs and symptoms of low red blood cells or anemia such as unusually weak or tired; feeling faint or lightheaded; falls; breathing problems Side effects that usually do not require medical attention (report to your doctor or health care professional if they continue or are bothersome):  loss of appetite  mouth sores  muscle cramps This list may not describe all possible side effects. Call your doctor for medical advice about side effects. You may report side effects to FDA at 1-800-FDA-1088. Where should I keep my medicine? This drug is given in a hospital or clinic and will not be stored at home. NOTE: This sheet is a summary. It may not cover all possible information. If you have questions about this medicine, talk to your doctor, pharmacist, or health care provider.  2021 Elsevier/Gold Standard (2018-08-08 15:59:17)  Etoposide, VP-16  injection What is this medicine? ETOPOSIDE, VP-16 (e toe POE side) is a chemotherapy drug. It is used to treat testicular cancer, lung cancer, and other cancers. This medicine may be used for other purposes; ask your health care provider or pharmacist if you have questions. COMMON BRAND NAME(S): Etopophos, Toposar, VePesid What should I tell my health care provider before I take this medicine? They need to know if you have any of these conditions:  infection  kidney disease  liver disease  low blood counts, like low  white cell, platelet, or red cell counts  an unusual or allergic reaction to etoposide, other medicines, foods, dyes, or preservatives  pregnant or trying to get pregnant  breast-feeding How should I use this medicine? This medicine is for infusion into a vein. It is administered in a hospital or clinic by a specially trained health care professional. Talk to your pediatrician regarding the use of this medicine in children. Special care may be needed. Overdosage: If you think you have taken too much of this medicine contact a poison control center or emergency room at once. NOTE: This medicine is only for you. Do not share this medicine with others. What if I miss a dose? It is important not to miss your dose. Call your doctor or health care professional if you are unable to keep an appointment. What may interact with this medicine? This medicine may interact with the following medications:  warfarin This list may not describe all possible interactions. Give your health care provider a list of all the medicines, herbs, non-prescription drugs, or dietary supplements you use. Also tell them if you smoke, drink alcohol, or use illegal drugs. Some items may interact with your medicine. What should I watch for while using this medicine? Visit your doctor for checks on your progress. This drug may make you feel generally unwell. This is not uncommon, as chemotherapy can affect healthy cells as well as cancer cells. Report any side effects. Continue your course of treatment even though you feel ill unless your doctor tells you to stop. In some cases, you may be given additional medicines to help with side effects. Follow all directions for their use. Call your doctor or health care professional for advice if you get a fever, chills or sore throat, or other symptoms of a cold or flu. Do not treat yourself. This drug decreases your body's ability to fight infections. Try to avoid being around people who  are sick. This medicine may increase your risk to bruise or bleed. Call your doctor or health care professional if you notice any unusual bleeding. Talk to your doctor about your risk of cancer. You may be more at risk for certain types of cancers if you take this medicine. Do not become pregnant while taking this medicine or for at least 6 months after stopping it. Women should inform their doctor if they wish to become pregnant or think they might be pregnant. Women of child-bearing potential will need to have a negative pregnancy test before starting this medicine. There is a potential for serious side effects to an unborn child. Talk to your health care professional or pharmacist for more information. Do not breast-feed an infant while taking this medicine. Men must use a latex condom during sexual contact with a woman while taking this medicine and for at least 4 months after stopping it. A latex condom is needed even if you have had a vasectomy. Contact your doctor right away if your partner becomes  pregnant. Do not donate sperm while taking this medicine and for at least 4 months after you stop taking this medicine. Men should inform their doctors if they wish to father a child. This medicine may lower sperm counts. What side effects may I notice from receiving this medicine? Side effects that you should report to your doctor or health care professional as soon as possible:  allergic reactions like skin rash, itching or hives, swelling of the face, lips, or tongue  low blood counts - this medicine may decrease the number of white blood cells, red blood cells, and platelets. You may be at increased risk for infections and bleeding  nausea, vomiting  redness, blistering, peeling or loosening of the skin, including inside the mouth  signs and symptoms of infection like fever; chills; cough; sore throat; pain or trouble passing urine  signs and symptoms of low red blood cells or anemia such as  unusually weak or tired; feeling faint or lightheaded; falls; breathing problems  unusual bruising or bleeding Side effects that usually do not require medical attention (report to your doctor or health care professional if they continue or are bothersome):  changes in taste  diarrhea  hair loss  loss of appetite  mouth sores This list may not describe all possible side effects. Call your doctor for medical advice about side effects. You may report side effects to FDA at 1-800-FDA-1088. Where should I keep my medicine? This drug is given in a hospital or clinic and will not be stored at home. NOTE: This sheet is a summary. It may not cover all possible information. If you have questions about this medicine, talk to your doctor, pharmacist, or health care provider.  2021 Elsevier/Gold Standard (2018-10-08 16:57:15)

## 2020-11-15 NOTE — Progress Notes (Signed)
[facility] Spiritual Care Note  Referred by Okey Dupre Gwynn/Administrative Assistant, [place] (Patient and [facility]), regarding questions about counseling and other supportive programming.  Met Susan Cortez in infusion, providing packet of [facility] resources, as well as information about Spiritual Care as an additional layer of support available for her and for her daughter, who will be visiting from out of state for ca one month.   She plans to phone when she has more clarity about needs and what may be helpful. We also plan to follow up at [facility], for which she is registered.   [place], [place], [facility] Pager [phone] Voicemail [phone]

## 2020-11-16 ENCOUNTER — Ambulatory Visit
Admission: RE | Admit: 2020-11-16 | Discharge: 2020-11-16 | Disposition: A | Discharge status: Home / Self Care | Payer: Medicare Other | Source: Ambulatory Visit | Attending: Radiation Oncology | Admitting: Radiation Oncology

## 2020-11-16 ENCOUNTER — Inpatient Hospital Stay: Payer: Medicare Other

## 2020-11-16 ENCOUNTER — Encounter: Payer: Self-pay | Admitting: Internal Medicine

## 2020-11-16 VITALS — BP 125/52 | HR 60 | Temp 98.6°F | Resp 18

## 2020-11-16 DIAGNOSIS — Z5111 Encounter for antineoplastic chemotherapy: Secondary | ICD-10-CM | POA: Diagnosis not present

## 2020-11-16 DIAGNOSIS — J449 Chronic obstructive pulmonary disease, unspecified: Secondary | ICD-10-CM | POA: Diagnosis not present

## 2020-11-16 DIAGNOSIS — Z86010 Personal history of colonic polyps: Secondary | ICD-10-CM | POA: Diagnosis not present

## 2020-11-16 DIAGNOSIS — J439 Emphysema, unspecified: Secondary | ICD-10-CM | POA: Diagnosis not present

## 2020-11-16 DIAGNOSIS — E785 Hyperlipidemia, unspecified: Secondary | ICD-10-CM | POA: Diagnosis not present

## 2020-11-16 DIAGNOSIS — C3491 Malignant neoplasm of unspecified part of right bronchus or lung: Secondary | ICD-10-CM

## 2020-11-16 DIAGNOSIS — Z510 Encounter for antineoplastic radiation therapy: Secondary | ICD-10-CM | POA: Insufficient documentation

## 2020-11-16 DIAGNOSIS — C3411 Malignant neoplasm of upper lobe, right bronchus or lung: Secondary | ICD-10-CM | POA: Insufficient documentation

## 2020-11-16 DIAGNOSIS — F17210 Nicotine dependence, cigarettes, uncomplicated: Secondary | ICD-10-CM | POA: Diagnosis not present

## 2020-11-16 DIAGNOSIS — I509 Heart failure, unspecified: Secondary | ICD-10-CM | POA: Diagnosis not present

## 2020-11-16 DIAGNOSIS — Z79899 Other long term (current) drug therapy: Secondary | ICD-10-CM | POA: Diagnosis not present

## 2020-11-16 DIAGNOSIS — Z85820 Personal history of malignant melanoma of skin: Secondary | ICD-10-CM | POA: Diagnosis not present

## 2020-11-16 MED ORDER — SODIUM CHLORIDE 0.9 % IV SOLN
10.0000 mg | Freq: Once | INTRAVENOUS | Status: AC
  Administered 2020-11-16: 10 mg via INTRAVENOUS
  Filled 2020-11-16: qty 10

## 2020-11-16 MED ORDER — SODIUM CHLORIDE 0.9% FLUSH
10.0000 mL | INTRAVENOUS | Status: DC | PRN
  Filled 2020-11-16: qty 10

## 2020-11-16 MED ORDER — SODIUM CHLORIDE 0.9 % IV SOLN
100.0000 mg/m2 | Freq: Once | INTRAVENOUS | Status: AC
  Administered 2020-11-16: 220 mg via INTRAVENOUS
  Filled 2020-11-16: qty 11

## 2020-11-16 MED ORDER — SODIUM CHLORIDE 0.9 % IV SOLN
Freq: Once | INTRAVENOUS | Status: AC
  Filled 2020-11-16: qty 250

## 2020-11-16 NOTE — Patient Instructions (Signed)
[facility] Discharge Instructions for Patients Receiving Chemotherapy  Today you received the following chemotherapy agents Etoposide.  To help prevent nausea and vomiting after your treatment, we encourage you to take your nausea medication as directed.   If you develop nausea and vomiting that is not controlled by your nausea medication, call the clinic.   BELOW ARE SYMPTOMS THAT SHOULD BE REPORTED IMMEDIATELY:  *FEVER GREATER THAN 100.5 F  *CHILLS WITH OR WITHOUT FEVER  NAUSEA AND VOMITING THAT IS NOT CONTROLLED WITH YOUR NAUSEA MEDICATION  *UNUSUAL SHORTNESS OF BREATH  *UNUSUAL BRUISING OR BLEEDING  TENDERNESS IN MOUTH AND THROAT WITH OR WITHOUT PRESENCE OF ULCERS  *URINARY PROBLEMS  *BOWEL PROBLEMS  UNUSUAL RASH Items with * indicate a potential emergency and should be followed up as soon as possible.  Feel free to call the clinic should you have any questions or concerns. The clinic phone number is [phone].  Please show the [facility] at check-in to the Emergency Department and triage nurse.

## 2020-11-16 NOTE — Progress Notes (Signed)
[facility] OFFICE PROGRESS NOTE  Copland, Gay Filler, MD [address] [place] [address]  DIAGNOSIS: Limited stage Small Cell Lung Cancer. She presented with a right upper lobe suprahilar nodule/adenopathy, right paratracheal lymph node, and a hypermetabolic right upper lobe nodule. She was diagnosed in February 2022.   PRIOR THERAPY: None  CURRENT THERAPY: Systemic chemotherapy with cisplatin 80 mg per metered squared on days 1, etoposide 100 mg per metered squared on days 1, 2, and 3 IV every 3 weeks with concurrent radiation.  First dose expected on 11/15/2020. Status post 1 cycle.  INTERVAL HISTORY: Susan Cortez 60 y.o. female returns  to the clinic today for a follow-up visit accompanied by her daughter.  The patient was recently diagnosed with small cell lung cancer. She is currently being treated with chemotherapy and radiation therapy. She started radiation today. She underwent her first cycle of chemotherapy last week and tolerated it well except for some fatigue. She also reports a few episodes of lightheadedness with standing. She does not take anti-hypetensives. She is doing well and drinks plenty of fluids. She denies any fever, chills, or night sweats. Per chart review, she has about a 2 lb weight loss which we will monitor.  She denies any chest pain, shortness of breath, or hemoptysis.   She occasionally reports her baseline productive cough.  She denies any  vomiting, or diarrhea. She had some nausea that was controlled with her anti-emetic. She experiences baseline constipation for which she uses a stool softener.  She also has occasional headaches in the left frontal/temporal region but attributes this to stopping caffeine because her headache resolved when she had a caffeinated beverage. Her staging brain MRI was negative for metastatic disease to the brain. She is here for evaluation and a 1 week follow up visit and to manage any adverse side  effects of treatment.     MEDICAL HISTORY: Past Medical History:  Diagnosis Date  . Allergy   . Arthritis   . Basal cell carcinoma 2018   Removed from Right neck per patient   . Diverticulosis 2013  . HLD (hyperlipidemia)   . Hx of adenomatous colonic polyps 2013  . OAB (overactive bladder)   . Pre-diabetes   . Small cell lung cancer ([place]) 11/09/2020    ALLERGIES:  is allergic to lyrica [pregabalin], butrans [buprenorphine], chantix [varenicline], diclofenac, meloxicam, tolmetin, and nsaids.  MEDICATIONS:  Current Outpatient Medications  Medication Sig Dispense Refill  . acetaminophen-codeine (TYLENOL #4) 300-60 MG tablet Take 1 tablet by mouth daily. 30 tablet 2  . CALCIUM PO Take 650 mg by mouth daily.    . Cholecalciferol (VITAMIN D3) 125 MCG (5000 UT) CAPS Take 5,000 Units by mouth daily.    Marland Kitchen lidocaine-prilocaine (EMLA) cream Apply 1 application topically as needed. 30 g 0  . LORazepam (ATIVAN) 0.5 MG tablet Take 1 tablet (0.5 mg total) by mouth every 8 (eight) hours. 4 tablet 0  . lovastatin (MEVACOR) 20 MG tablet TAKE 1 TABLET(20 MG) BY MOUTH AT BEDTIME (Patient taking differently: Take 20 mg by mouth at bedtime.) 90 tablet 3  . MYRBETRIQ 50 MG TB24 tablet TAKE 1 TABLET BY MOUTH EVERY DAY 30 tablet 0  . prochlorperazine (COMPAZINE) 10 MG tablet Take 1 tablet (10 mg total) by mouth every 6 (six) hours as needed. 30 tablet 2   No current facility-administered medications for this visit.    SURGICAL HISTORY:  Past Surgical History:  Procedure Laterality  Date  . CESAREAN SECTION     X 3  . COLONOSCOPY  2013, 2016, 2019   hx colon polyps  . DILATION AND CURETTAGE OF UTERUS    . FUDUCIAL PLACEMENT Right 10/18/2020   Procedure: PLACEMENT OF FUDUCIAL;  Surgeon: Collene Gobble, MD;  Location: [facility];  Service: Thoracic;  Laterality: Right;  . SHOULDER ARTHROSCOPY WITH OPEN ROTATOR CUFF REPAIR Right 05/2009  . TONSILLECTOMY  1977  . TUBAL LIGATION    . VIDEO BRONCHOSCOPY  WITH ENDOBRONCHIAL NAVIGATION N/A 10/18/2020   Procedure: VIDEO BRONCHOSCOPY WITH ENDOBRONCHIAL NAVIGATION;  Surgeon: Collene Gobble, MD;  Location: [facility];  Service: Thoracic;  Laterality: N/A;  . VIDEO BRONCHOSCOPY WITH ENDOBRONCHIAL ULTRASOUND N/A 10/18/2020   Procedure: VIDEO BRONCHOSCOPY WITH ENDOBRONCHIAL ULTRASOUND;  Surgeon: Collene Gobble, MD;  Location: [facility];  Service: Thoracic;  Laterality: N/A;  . WISDOM TOOTH EXTRACTION      REVIEW OF SYSTEMS:   Review of Systems  Constitutional: Positive for mild fatigue and 2 pound weight loss.  Negative for appetite change, chills,  and fever.  HENT:  Negative for mouth sores, nosebleeds, sore throat and trouble swallowing.   Eyes: Negative for eye problems and icterus.  Respiratory: Negative for cough, hemoptysis, shortness of breath and wheezing.   Cardiovascular: Negative for chest pain and leg swelling.  Gastrointestinal: Negative for abdominal pain, constipation, diarrhea, nausea and vomiting.  Genitourinary: Negative for bladder incontinence, difficulty urinating, dysuria, frequency and hematuria.   Musculoskeletal: Negative for back pain, gait problem, neck pain and neck stiffness.  Skin: Negative for itching and rash.  Neurological: Positive for few episodes of lightheadedness upon standing.  Negative for dizziness, extremity weakness, gait problem, headaches, and seizures.  Hematological: Negative for adenopathy. Does not bruise/bleed easily.  Psychiatric/Behavioral: Negative for confusion, depression and sleep disturbance. The patient is not nervous/anxious.     PHYSICAL EXAMINATION:  Blood pressure 105/66, pulse 96, temperature (!) 97.4 F (36.3 C), temperature source Tympanic, resp. rate 19, height 5\' 8"  (1.727 m), weight 216 lb 9.6 oz (98.2 kg), SpO2 100 %.  ECOG PERFORMANCE STATUS: 1 - Symptomatic but completely ambulatory  Physical Exam  Constitutional: Oriented to person, place, and time and well-developed,  well-nourished, and in no distress.  HENT:  Head: Normocephalic and atraumatic.  Mouth/Throat: Oropharynx is clear and moist. No oropharyngeal exudate.  Eyes: Conjunctivae are normal. Right eye exhibits no discharge. Left eye exhibits no discharge. No scleral icterus.  Neck: Normal range of motion. Neck supple.  Cardiovascular: Normal rate, regular rhythm, normal heart sounds and intact distal pulses.   Pulmonary/Chest: Effort normal and breath sounds normal. No respiratory distress. No wheezes. No rales.  Abdominal: Soft. Bowel sounds are normal. Exhibits no distension and no mass. There is no tenderness.  Musculoskeletal: Normal range of motion. Exhibits no edema.  Lymphadenopathy:    No cervical adenopathy.  Neurological: Alert and oriented to person, place, and time. Exhibits normal muscle tone. Gait normal. Coordination normal.  Skin: Skin is warm and dry. No rash noted. Not diaphoretic. No erythema. No pallor.  Psychiatric: Mood, memory and judgment normal.  Vitals reviewed.  LABORATORY DATA: Lab Results  Component Value Date   WBC 10.1 11/21/2020   HGB 14.7 11/21/2020   HCT 43.5 11/21/2020   MCV 92.4 11/21/2020   PLT 294 11/21/2020      Chemistry      Component Value Date/Time   NA 136 11/21/2020 1431   K 4.1 11/21/2020 1431   CL 102  11/21/2020 1431   CO2 22 11/21/2020 1431   BUN 15 11/21/2020 1431   CREATININE 0.77 11/21/2020 1431   CREATININE 0.65 06/08/2020 1050      Component Value Date/Time   CALCIUM 9.0 11/21/2020 1431   ALKPHOS 58 11/21/2020 1431   AST 22 11/21/2020 1431   ALT 43 11/21/2020 1431   BILITOT 0.4 11/21/2020 1431       RADIOGRAPHIC STUDIES:  MR Brain W Wo Contrast  Result Date: 11/06/2020 CLINICAL DATA:  Small cell lung cancer. Staging. EXAM: MRI HEAD WITHOUT AND WITH CONTRAST TECHNIQUE: Multiplanar, multiecho pulse sequences of the brain and surrounding structures were obtained without and with intravenous contrast. CONTRAST:  74mL  GADAVIST GADOBUTROL 1 MMOL/ML IV SOLN COMPARISON:  None. FINDINGS: Brain: No acute infarct, hemorrhage, or mass lesion is present. No significant white matter lesions are present. The ventricles are of normal size. No significant extraaxial fluid collection is present. The internal auditory canals are within normal limits. Remote lacunar infarcts are present in the right PICA territory. Brainstem and cerebellum are otherwise within normal limits. Vascular: Flow is present in the major intracranial arteries. Skull and upper cervical spine: The craniocervical junction is normal. Upper cervical spine is within normal limits. Marrow signal is unremarkable. Sinuses/Orbits: The paranasal sinuses and mastoid air cells are clear. The globes and orbits are within normal limits. IMPRESSION: 1. No evidence for metastatic disease to the brain or meninges. 2. Remote lacunar infarcts of the right PICA territory. Electronically Signed   By: San Morelle M.D.   On: 11/06/2020 20:09   NM PET Image Initial (PI) Skull Base To Thigh  Result Date: 11/08/2020 CLINICAL DATA:  Initial treatment strategy for small cell lung cancer. EXAM: NUCLEAR MEDICINE PET SKULL BASE TO THIGH TECHNIQUE: 11.0 mCi F-18 FDG was injected intravenously. Full-ring PET imaging was performed from the skull base to thigh after the radiotracer. CT data was obtained and used for attenuation correction and anatomic localization. Fasting blood glucose: 115 mg/dl COMPARISON:  CT chest dated 10/05/2020 FINDINGS: Mediastinal blood pool activity: SUV max 2.9 Liver activity: SUV max NA NECK: Diffuse symmetric glottic activity is likely physiologic. There is no CT correlate. No hypermetabolic adenopathy in the neck. Incidental CT findings: 0.8 cm hypodense nodule in the right thyroid gland is not hypermetabolic. Not clinically significant; no follow-up imaging recommended (ref: J Am Coll Radiol. 2015 Feb;12(2): 143-50). CHEST: A right upper lobe suprahilar  nodule with maximum SUV of 17.7 measures approximately 2.9 by 1.7 cm. Posteriorly in the right upper lobe, an approximately 1.1 by 0.9 cm nodule has maximum SUV of 4.9, favoring malignancy. Right paratracheal lymph node on image 63 series 4 measures 2.3 cm in short axis and has a maximum SUV of 14.2, compatible with malignancy. Incidental CT findings: Aortic atherosclerotic calcification. Emphysema. ABDOMEN/PELVIS: Mass of the medial limb left adrenal gland 1.3 by 1.2 cm in diameter, -10 Hounsfield units, compatible with adenoma. Mass of the lateral limb of the left adrenal gland measuring 1.5 by 1.5 cm on image 112 series for, internal density 7 Hounsfield units, compatible with adenoma. There is only low-grade associated metabolic activity. Incidental CT findings: Aortoiliac atherosclerotic vascular disease. SKELETON: No significant abnormal hypermetabolic activity in this region. Incidental CT findings: Degenerative disc disease at L5-S1. IMPRESSION: 1. Central right upper lobe suprahilar nodule/adenopathy with maximum SUV of 17.7. Hypermetabolic large right paratracheal lymph node, and hypermetabolic nodule posteriorly in the right upper lobe. No findings of contralateral or extrathoracic spread. 2. Other imaging findings of  potential clinical significance: Left adrenal adenomas. Aortic Atherosclerosis (ICD10-I70.0) and Emphysema (ICD10-J43.9). Electronically Signed   By: Van Clines M.D.   On: 11/08/2020 16:37     ASSESSMENT/PLAN:  This is a very pleasant 60 year old Caucasian female diagnosed with Limited stage (T3, N2, M0) small cell lung cancer presented with right upper lobe lung nodule, hypermetabolic nodule posteriorly in the right upper lobe in addition to right paratracheal lymphadenopathy diagnosed in February 2022.  The patient is currently undergoing cisplatin 80 mg per metered squared on days 1, etoposide 100 mg/m on days 1, 2, and 3 IV every 3 weeks with concurrent radiation. She is  status post 1 cycle and tolerated it well. She had mild fatigue, nausea which was controlled and a few episodes of lightheadedness with standing.    Labs were reviewed which are unremarkable. Recommend that she continue on the same treatment.   We will see her back for a follow up visit in 2 weeks for evaluation before starting cycle #2.   For her lightheadedness, the patient's blood pressure is 105/66 which is a little lower than what is normal for her. I advised her to continue to increase her fluid intake. I also advised her to change position slowly as she had some lightheadedness with standing which may be orthostatic.   The patient was advised to call immediately if she has any concerning symptoms in the interval. The patient voices understanding of current disease status and treatment options and is in agreement with the current care plan. All questions were answered. The patient knows to call the clinic with any problems, questions or concerns. We can certainly see the patient much sooner if necessary    Orders Placed This Encounter  Procedures  . IR Perc Tun Perit Cath W/Port    Please try to schedule before her next chemo appointment which is 12/06/20. Ideally, schedule for this week or next week (4/4).    Standing Status:   Future    Standing Expiration Date:   11/21/2021    Order Specific Question:   Reason for exam:    Answer:   Port for chemo. Next treatment is 4/12 so ideally place port this week or week of 11/28/20    Order Specific Question:   Preferred Imaging Location?    Answer:   [facility]     I spent 20-29 minutes in this encounter.   Wynton Hufstetler L Yuri Flener, PA-C 11/21/20

## 2020-11-16 NOTE — Progress Notes (Signed)
Called pt to introduce myself as her Arboriculturist, discuss copay assistance and the [employer].  She gave me consent to apply in her behalf so I completed the online application w/ the [facility] and she was approved for $8,000 for Carboplatin and Etoposide from 10/17/20 through 10/16/21. I also informed her of the [employer] and went over what it covers.  Pt would like to apply so she will bring her proof of income on 11/17/20.  If approved I will give her an expense sheet and my card for any questions or concerns she may have in the future.

## 2020-11-17 ENCOUNTER — Other Ambulatory Visit: Payer: Self-pay

## 2020-11-17 ENCOUNTER — Inpatient Hospital Stay: Payer: Medicare Other

## 2020-11-17 ENCOUNTER — Encounter: Payer: Self-pay | Admitting: Internal Medicine

## 2020-11-17 VITALS — BP 159/71 | HR 74 | Temp 98.1°F | Resp 18

## 2020-11-17 DIAGNOSIS — J449 Chronic obstructive pulmonary disease, unspecified: Secondary | ICD-10-CM | POA: Diagnosis not present

## 2020-11-17 DIAGNOSIS — Z79899 Other long term (current) drug therapy: Secondary | ICD-10-CM | POA: Diagnosis not present

## 2020-11-17 DIAGNOSIS — E785 Hyperlipidemia, unspecified: Secondary | ICD-10-CM | POA: Diagnosis not present

## 2020-11-17 DIAGNOSIS — C3411 Malignant neoplasm of upper lobe, right bronchus or lung: Secondary | ICD-10-CM | POA: Diagnosis not present

## 2020-11-17 DIAGNOSIS — J439 Emphysema, unspecified: Secondary | ICD-10-CM | POA: Diagnosis not present

## 2020-11-17 DIAGNOSIS — C3491 Malignant neoplasm of unspecified part of right bronchus or lung: Secondary | ICD-10-CM

## 2020-11-17 DIAGNOSIS — F17210 Nicotine dependence, cigarettes, uncomplicated: Secondary | ICD-10-CM | POA: Diagnosis not present

## 2020-11-17 DIAGNOSIS — Z86010 Personal history of colonic polyps: Secondary | ICD-10-CM | POA: Diagnosis not present

## 2020-11-17 DIAGNOSIS — Z85820 Personal history of malignant melanoma of skin: Secondary | ICD-10-CM | POA: Diagnosis not present

## 2020-11-17 DIAGNOSIS — Z5111 Encounter for antineoplastic chemotherapy: Secondary | ICD-10-CM | POA: Diagnosis not present

## 2020-11-17 DIAGNOSIS — I509 Heart failure, unspecified: Secondary | ICD-10-CM | POA: Diagnosis not present

## 2020-11-17 MED ORDER — HOT PACK MISC ONCOLOGY
1.0000 | Freq: Once | Status: DC | PRN
  Filled 2020-11-17: qty 1

## 2020-11-17 MED ORDER — SODIUM CHLORIDE 0.9 % IV SOLN
100.0000 mg/m2 | Freq: Once | INTRAVENOUS | Status: AC
  Administered 2020-11-17: 220 mg via INTRAVENOUS
  Filled 2020-11-17: qty 11

## 2020-11-17 MED ORDER — SODIUM CHLORIDE 0.9 % IV SOLN
10.0000 mg | Freq: Once | INTRAVENOUS | Status: AC
  Administered 2020-11-17: 10 mg via INTRAVENOUS
  Filled 2020-11-17: qty 10

## 2020-11-17 MED ORDER — SODIUM CHLORIDE 0.9 % IV SOLN
Freq: Once | INTRAVENOUS | Status: AC
Start: 2020-11-17 — End: 2020-11-17
  Filled 2020-11-17: qty 250

## 2020-11-17 NOTE — Progress Notes (Signed)
Pt is approved for the $1000 [employer] grant.  

## 2020-11-18 ENCOUNTER — Inpatient Hospital Stay: Payer: Medicare Other

## 2020-11-18 ENCOUNTER — Telehealth: Payer: Self-pay | Admitting: *Deleted

## 2020-11-18 DIAGNOSIS — Z510 Encounter for antineoplastic radiation therapy: Secondary | ICD-10-CM | POA: Diagnosis not present

## 2020-11-18 DIAGNOSIS — C3411 Malignant neoplasm of upper lobe, right bronchus or lung: Secondary | ICD-10-CM | POA: Diagnosis not present

## 2020-11-18 DIAGNOSIS — F17210 Nicotine dependence, cigarettes, uncomplicated: Secondary | ICD-10-CM | POA: Diagnosis not present

## 2020-11-21 ENCOUNTER — Ambulatory Visit
Admission: RE | Admit: 2020-11-21 | Discharge: 2020-11-21 | Disposition: A | Discharge status: Home / Self Care | Payer: Medicare Other | Source: Ambulatory Visit | Attending: Radiation Oncology | Admitting: Radiation Oncology

## 2020-11-21 ENCOUNTER — Inpatient Hospital Stay (HOSPITAL_BASED_OUTPATIENT_CLINIC_OR_DEPARTMENT_OTHER): Payer: Medicare Other | Admitting: Physician Assistant

## 2020-11-21 ENCOUNTER — Inpatient Hospital Stay: Payer: Medicare Other

## 2020-11-21 ENCOUNTER — Other Ambulatory Visit: Payer: Self-pay

## 2020-11-21 VITALS — BP 105/66 | HR 96 | Temp 97.4°F | Resp 19 | Ht 68.0 in | Wt 216.6 lb

## 2020-11-21 DIAGNOSIS — Z85820 Personal history of malignant melanoma of skin: Secondary | ICD-10-CM | POA: Diagnosis not present

## 2020-11-21 DIAGNOSIS — J449 Chronic obstructive pulmonary disease, unspecified: Secondary | ICD-10-CM | POA: Diagnosis not present

## 2020-11-21 DIAGNOSIS — Z5111 Encounter for antineoplastic chemotherapy: Secondary | ICD-10-CM | POA: Diagnosis not present

## 2020-11-21 DIAGNOSIS — I509 Heart failure, unspecified: Secondary | ICD-10-CM | POA: Diagnosis not present

## 2020-11-21 DIAGNOSIS — C3491 Malignant neoplasm of unspecified part of right bronchus or lung: Secondary | ICD-10-CM | POA: Diagnosis not present

## 2020-11-21 DIAGNOSIS — C3411 Malignant neoplasm of upper lobe, right bronchus or lung: Secondary | ICD-10-CM | POA: Diagnosis not present

## 2020-11-21 DIAGNOSIS — F17210 Nicotine dependence, cigarettes, uncomplicated: Secondary | ICD-10-CM | POA: Diagnosis not present

## 2020-11-21 DIAGNOSIS — Z510 Encounter for antineoplastic radiation therapy: Secondary | ICD-10-CM | POA: Diagnosis not present

## 2020-11-21 DIAGNOSIS — Z79899 Other long term (current) drug therapy: Secondary | ICD-10-CM | POA: Diagnosis not present

## 2020-11-21 DIAGNOSIS — Z86010 Personal history of colonic polyps: Secondary | ICD-10-CM | POA: Diagnosis not present

## 2020-11-21 DIAGNOSIS — E785 Hyperlipidemia, unspecified: Secondary | ICD-10-CM | POA: Diagnosis not present

## 2020-11-21 DIAGNOSIS — J439 Emphysema, unspecified: Secondary | ICD-10-CM | POA: Diagnosis not present

## 2020-11-21 DIAGNOSIS — C3490 Malignant neoplasm of unspecified part of unspecified bronchus or lung: Secondary | ICD-10-CM

## 2020-11-21 LAB — CMP (CANCER CENTER ONLY)
ALT: 43 U/L (ref 0–44)
AST: 22 U/L (ref 15–41)
Albumin: 4.1 g/dL (ref 3.5–5.0)
Alkaline Phosphatase: 58 U/L (ref 38–126)
Anion gap: 12 (ref 5–15)
BUN: 15 mg/dL (ref 8–23)
CO2: 22 mmol/L (ref 22–32)
Calcium: 9 mg/dL (ref 8.9–10.3)
Chloride: 102 mmol/L (ref 98–111)
Creatinine: 0.77 mg/dL (ref 0.44–1.00)
GFR, Estimated: >60 mL/min (ref >60)
Glucose, Bld: 110 mg/dL — ABNORMAL HIGH (ref 70–99)
Potassium: 4.1 mmol/L (ref 3.5–5.1)
Sodium: 136 mmol/L (ref 135–145)
Total Bilirubin: 0.4 mg/dL (ref 0.3–1.2)
Total Protein: 7.1 g/dL (ref 6.5–8.1)

## 2020-11-21 LAB — CBC WITH DIFFERENTIAL (CANCER CENTER ONLY)
Abs Immature Granulocytes: 0.06 10*3/uL (ref 0.00–0.07)
Basophils Absolute: 0.1 10*3/uL (ref 0.0–0.1)
Basophils Relative: 1 %
Eosinophils Absolute: 0 10*3/uL (ref 0.0–0.5)
Eosinophils Relative: 0 %
HCT: 43.5 % (ref 36.0–46.0)
Hemoglobin: 14.7 g/dL (ref 12.0–15.0)
Immature Granulocytes: 1 %
Lymphocytes Relative: 37 %
Lymphs Abs: 3.7 10*3/uL (ref 0.7–4.0)
MCH: 31.2 pg (ref 26.0–34.0)
MCHC: 33.8 g/dL (ref 30.0–36.0)
MCV: 92.4 fL (ref 80.0–100.0)
Monocytes Absolute: 0.1 10*3/uL (ref 0.1–1.0)
Monocytes Relative: 1 %
Neutro Abs: 6.2 10*3/uL (ref 1.7–7.7)
Neutrophils Relative %: 60 %
Platelet Count: 294 10*3/uL (ref 150–400)
RBC: 4.71 MIL/uL (ref 3.87–5.11)
RDW: 12.7 % (ref 11.5–15.5)
WBC Count: 10.1 10*3/uL (ref 4.0–10.5)
nRBC: 0 % (ref 0.0–0.2)

## 2020-11-21 MED ORDER — LIDOCAINE-PRILOCAINE 2.5-2.5 % EX CREA: 1.0000 "application " | TOPICAL_CREAM | CUTANEOUS | 0 refills | Status: DC | PRN

## 2020-11-22 ENCOUNTER — Ambulatory Visit
Admission: RE | Admit: 2020-11-22 | Discharge: 2020-11-22 | Disposition: A | Discharge status: Home / Self Care | Payer: Medicare Other | Source: Ambulatory Visit | Attending: Radiation Oncology | Admitting: Radiation Oncology

## 2020-11-22 DIAGNOSIS — C3411 Malignant neoplasm of upper lobe, right bronchus or lung: Secondary | ICD-10-CM | POA: Diagnosis not present

## 2020-11-22 DIAGNOSIS — F17210 Nicotine dependence, cigarettes, uncomplicated: Secondary | ICD-10-CM | POA: Diagnosis not present

## 2020-11-22 DIAGNOSIS — Z510 Encounter for antineoplastic radiation therapy: Secondary | ICD-10-CM | POA: Diagnosis not present

## 2020-11-23 ENCOUNTER — Other Ambulatory Visit: Payer: Self-pay

## 2020-11-23 ENCOUNTER — Ambulatory Visit
Admission: RE | Admit: 2020-11-23 | Discharge: 2020-11-23 | Disposition: A | Discharge status: Home / Self Care | Payer: Medicare Other | Source: Ambulatory Visit | Attending: Radiation Oncology | Admitting: Radiation Oncology

## 2020-11-23 DIAGNOSIS — C3411 Malignant neoplasm of upper lobe, right bronchus or lung: Secondary | ICD-10-CM | POA: Diagnosis not present

## 2020-11-23 DIAGNOSIS — Z510 Encounter for antineoplastic radiation therapy: Secondary | ICD-10-CM | POA: Diagnosis not present

## 2020-11-23 DIAGNOSIS — F17210 Nicotine dependence, cigarettes, uncomplicated: Secondary | ICD-10-CM | POA: Diagnosis not present

## 2020-11-24 ENCOUNTER — Ambulatory Visit
Admission: RE | Admit: 2020-11-24 | Discharge: 2020-11-24 | Disposition: A | Discharge status: Home / Self Care | Payer: Medicare Other | Source: Ambulatory Visit | Attending: Radiation Oncology | Admitting: Radiation Oncology

## 2020-11-24 DIAGNOSIS — Z510 Encounter for antineoplastic radiation therapy: Secondary | ICD-10-CM | POA: Diagnosis not present

## 2020-11-24 DIAGNOSIS — C3411 Malignant neoplasm of upper lobe, right bronchus or lung: Secondary | ICD-10-CM | POA: Diagnosis not present

## 2020-11-24 DIAGNOSIS — F17210 Nicotine dependence, cigarettes, uncomplicated: Secondary | ICD-10-CM | POA: Diagnosis not present

## 2020-11-24 MED ORDER — SONAFINE EX EMUL
1.0000 "application " | Freq: Two times a day (BID) | CUTANEOUS | Status: DC
  Administered 2020-11-24: 1 via TOPICAL

## 2020-11-24 NOTE — Progress Notes (Signed)

## 2020-11-25 ENCOUNTER — Other Ambulatory Visit: Payer: Self-pay

## 2020-11-25 ENCOUNTER — Ambulatory Visit
Admission: RE | Admit: 2020-11-25 | Discharge: 2020-11-25 | Disposition: A | Discharge status: Home / Self Care | Payer: Medicare Other | Source: Ambulatory Visit | Attending: Radiation Oncology | Admitting: Radiation Oncology

## 2020-11-25 DIAGNOSIS — Z510 Encounter for antineoplastic radiation therapy: Secondary | ICD-10-CM | POA: Diagnosis not present

## 2020-11-25 DIAGNOSIS — F17210 Nicotine dependence, cigarettes, uncomplicated: Secondary | ICD-10-CM | POA: Diagnosis not present

## 2020-11-25 DIAGNOSIS — C3411 Malignant neoplasm of upper lobe, right bronchus or lung: Secondary | ICD-10-CM | POA: Insufficient documentation

## 2020-11-28 ENCOUNTER — Ambulatory Visit
Admission: RE | Admit: 2020-11-28 | Discharge: 2020-11-28 | Disposition: A | Discharge status: Home / Self Care | Payer: Medicare Other | Source: Ambulatory Visit | Attending: Radiation Oncology | Admitting: Radiation Oncology

## 2020-11-28 ENCOUNTER — Other Ambulatory Visit: Payer: Self-pay

## 2020-11-28 ENCOUNTER — Other Ambulatory Visit: Payer: Self-pay | Admitting: Radiology

## 2020-11-28 DIAGNOSIS — F17210 Nicotine dependence, cigarettes, uncomplicated: Secondary | ICD-10-CM | POA: Diagnosis not present

## 2020-11-28 DIAGNOSIS — Z510 Encounter for antineoplastic radiation therapy: Secondary | ICD-10-CM | POA: Diagnosis not present

## 2020-11-28 DIAGNOSIS — C3411 Malignant neoplasm of upper lobe, right bronchus or lung: Secondary | ICD-10-CM | POA: Diagnosis not present

## 2020-11-29 ENCOUNTER — Other Ambulatory Visit: Payer: Self-pay | Admitting: Physician Assistant

## 2020-11-29 ENCOUNTER — Other Ambulatory Visit: Payer: Self-pay

## 2020-11-29 ENCOUNTER — Ambulatory Visit (HOSPITAL_COMMUNITY)
Admission: RE | Admit: 2020-11-29 | Discharge: 2020-11-29 | Disposition: A | Discharge status: Home / Self Care | Payer: Medicare Other | Source: Ambulatory Visit | Attending: Physician Assistant | Admitting: Physician Assistant

## 2020-11-29 ENCOUNTER — Ambulatory Visit
Admission: RE | Admit: 2020-11-29 | Discharge: 2020-11-29 | Disposition: A | Discharge status: Home / Self Care | Payer: Medicare Other | Source: Ambulatory Visit | Attending: Radiation Oncology | Admitting: Radiation Oncology

## 2020-11-29 DIAGNOSIS — Z886 Allergy status to analgesic agent status: Secondary | ICD-10-CM | POA: Insufficient documentation

## 2020-11-29 DIAGNOSIS — C3491 Malignant neoplasm of unspecified part of right bronchus or lung: Secondary | ICD-10-CM

## 2020-11-29 DIAGNOSIS — F17210 Nicotine dependence, cigarettes, uncomplicated: Secondary | ICD-10-CM | POA: Diagnosis not present

## 2020-11-29 DIAGNOSIS — C3490 Malignant neoplasm of unspecified part of unspecified bronchus or lung: Secondary | ICD-10-CM | POA: Insufficient documentation

## 2020-11-29 DIAGNOSIS — Z510 Encounter for antineoplastic radiation therapy: Secondary | ICD-10-CM | POA: Diagnosis not present

## 2020-11-29 DIAGNOSIS — Z888 Allergy status to other drugs, medicaments and biological substances status: Secondary | ICD-10-CM | POA: Insufficient documentation

## 2020-11-29 DIAGNOSIS — C3411 Malignant neoplasm of upper lobe, right bronchus or lung: Secondary | ICD-10-CM | POA: Diagnosis not present

## 2020-11-29 DIAGNOSIS — Z79899 Other long term (current) drug therapy: Secondary | ICD-10-CM | POA: Insufficient documentation

## 2020-11-29 DIAGNOSIS — Z452 Encounter for adjustment and management of vascular access device: Secondary | ICD-10-CM | POA: Diagnosis not present

## 2020-11-29 HISTORY — PX: IR IMAGING GUIDED PORT INSERTION: IMG5740

## 2020-11-29 MED ORDER — HEPARIN SOD (PORK) LOCK FLUSH 100 UNIT/ML IV SOLN
INTRAVENOUS | Status: AC | PRN
  Administered 2020-11-29: 500 [IU] via INTRAVENOUS

## 2020-11-29 MED ORDER — MIDAZOLAM HCL 2 MG/2ML IJ SOLN
INTRAMUSCULAR | Status: AC | PRN
  Administered 2020-11-29 (×4): 1 mg via INTRAVENOUS

## 2020-11-29 MED ORDER — CEFAZOLIN SODIUM-DEXTROSE 2-4 GM/100ML-% IV SOLN: 2.0000 g | INTRAVENOUS | Status: DC

## 2020-11-29 MED ORDER — FENTANYL CITRATE (PF) 100 MCG/2ML IJ SOLN
INTRAMUSCULAR | Status: AC
  Filled 2020-11-29: qty 2

## 2020-11-29 MED ORDER — LIDOCAINE HCL (PF) 1 % IJ SOLN
INTRAMUSCULAR | Status: AC | PRN
Start: 2020-11-29 — End: 2020-11-29
  Administered 2020-11-29 (×2): 10 mL via INTRADERMAL

## 2020-11-29 MED ORDER — LIDOCAINE HCL 1 % IJ SOLN
INTRAMUSCULAR | Status: AC
  Filled 2020-11-29: qty 20

## 2020-11-29 MED ORDER — SODIUM CHLORIDE 0.9 % IV SOLN: INTRAVENOUS | Status: DC

## 2020-11-29 MED ORDER — HEPARIN SOD (PORK) LOCK FLUSH 100 UNIT/ML IV SOLN
INTRAVENOUS | Status: AC
  Filled 2020-11-29: qty 5

## 2020-11-29 MED ORDER — MIDAZOLAM HCL 2 MG/2ML IJ SOLN
INTRAMUSCULAR | Status: AC
  Filled 2020-11-29: qty 4

## 2020-11-29 MED ORDER — FENTANYL CITRATE (PF) 100 MCG/2ML IJ SOLN
INTRAMUSCULAR | Status: AC | PRN
  Administered 2020-11-29 (×2): 50 ug via INTRAVENOUS

## 2020-11-29 NOTE — Procedures (Signed)
Interventional Radiology Procedure Note  Procedure: Single Lumen Power Port Placement    Access:  Right IJ vein.  Findings: Catheter tip positioned at SVC/RA junction. Port is ready for immediate use.   Complications: None  EBL: < 10 mL  Recommendations:  - Ok to shower in 24 hours - Do not submerge for 7 days - Routine line care   Ezequias Lard T. Dantavious Snowball, M.D Pager:  [phone]   

## 2020-11-29 NOTE — Discharge Instructions (Signed)
For questions /concerns may call Interventional Radiology at [phone]  You may remove your dressing and shower tomorrow afternoon  DO NOT use EMLA cream for 2 weeks after port placement as the cream will remove surgical glue on your incision.    Implanted Port Insertion, Care After This sheet gives you information about how to care for yourself after your procedure. Your health care provider may also give you more specific instructions. If you have problems or questions, contact your health care provider. What can I expect after the procedure? After the procedure, it is common to have:  Discomfort at the port insertion site.  Bruising on the skin over the port. This should improve over 3-4 days. Follow these instructions at home: [facility] care  After your port is placed, you will get a manufacturer's information card. The card has information about your port. Keep this card with you at all times.  Take care of the port as told by your health care provider. Ask your health care provider if you or a family member can get training for taking care of the port at home. A home health care nurse may also take care of the port.  Make sure to remember what type of port you have. Incision care  Follow instructions from your health care provider about how to take care of your port insertion site. Make sure you: ? Wash your hands with soap and water before and after you change your bandage (dressing). If soap and water are not available, use hand sanitizer. ? Change your dressing as told by your health care provider. ? Leave stitches (sutures), skin glue, or adhesive strips in place. These skin closures may need to stay in place for 2 weeks or longer. If adhesive strip edges start to loosen and curl up, you may trim the loose edges. Do not remove adhesive strips completely unless your health care provider tells you to do that.  Check your port insertion site every day for signs of infection. Check  for: ? Redness, swelling, or pain. ? Fluid or blood. ? Warmth. ? Pus or a bad smell.      Activity  Return to your normal activities as told by your health care provider. Ask your health care provider what activities are safe for you.  Do not lift anything that is heavier than 10 lb (4.5 kg), or the limit that you are told, until your health care provider says that it is safe. General instructions  Take over-the-counter and prescription medicines only as told by your health care provider.  Do not take baths, swim, or use a hot tub until your health care provider approves. Ask your health care provider if you may take showers. You may only be allowed to take sponge baths.  Do not drive for 24 hours if you were given a sedative during your procedure.  Wear a medical alert bracelet in case of an emergency. This will tell any health care providers that you have a port.  Keep all follow-up visits as told by your health care provider. This is important. Contact a health care provider if:  You cannot flush your port with saline as directed, or you cannot draw blood from the port.  You have a fever or chills.  You have redness, swelling, or pain around your port insertion site.  You have fluid or blood coming from your port insertion site.  Your port insertion site feels warm to the touch.  You have pus or a  bad smell coming from the port insertion site. Get help right away if:  You have chest pain or shortness of breath.  You have bleeding from your port that you cannot control. Summary  Take care of the port as told by your health care provider. Keep the manufacturer's information card with you at all times.  Change your dressing as told by your health care provider.  Contact a health care provider if you have a fever or chills or if you have redness, swelling, or pain around your port insertion site.  Keep all follow-up visits as told by your health care provider. This  information is not intended to replace advice given to you by your health care provider. Make sure you discuss any questions you have with your health care provider.   Moderate Conscious Sedation, Adult, Care After This sheet gives you information about how to care for yourself after your procedure. Your health care provider may also give you more specific instructions. If you have problems or questions, contact your health care provider. What can I expect after the procedure? After the procedure, it is common to have:  Sleepiness for several hours.  Impaired judgment for several hours.  Difficulty with balance.  Vomiting if you eat too soon. Follow these instructions at home: For the time period you were told by your health care provider:  Rest.  Do not participate in activities where you could fall or become injured.  Do not drive or use machinery.  Do not drink alcohol.  Do not take sleeping pills or medicines that cause drowsiness.  Do not make important decisions or sign legal documents.  Do not take care of children on your own.      Eating and drinking  Follow the diet recommended by your health care provider.  Drink enough fluid to keep your urine pale yellow.  If you vomit: ? Drink water, juice, or soup when you can drink without vomiting. ? Make sure you have little or no nausea before eating solid foods.   General instructions  Take over-the-counter and prescription medicines only as told by your health care provider.  Have a responsible adult stay with you for the time you are told. It is important to have someone help care for you until you are awake and alert.  Do not smoke.  Keep all follow-up visits as told by your health care provider. This is important. Contact a health care provider if:  You are still sleepy or having trouble with balance after 24 hours.  You feel light-headed.  You keep feeling nauseous or you keep vomiting.  You develop  a rash.  You have a fever.  You have redness or swelling around the IV site. Get help right away if:  You have trouble breathing.  You have new-onset confusion at home. Summary  After the procedure, it is common to feel sleepy, have impaired judgment, or feel nauseous if you eat too soon.  Rest after you get home. Know the things you should not do after the procedure.  Follow the diet recommended by your health care provider and drink enough fluid to keep your urine pale yellow.  Get help right away if you have trouble breathing or new-onset confusion at home. This information is not intended to replace advice given to you by your health care provider. Make sure you discuss any questions you have with your health care provider. Document Revised: 12/11/2019 Document Reviewed: 07/09/2019 Elsevier Patient Education  2021 [employer]

## 2020-11-29 NOTE — Consult Note (Signed)
Chief Complaint: Patient was seen in consultation today for port a cath placement  Referring Physician(s): Heilingoetter,Cassandra L/Mohamed,M  Supervising Physician: Aletta Edouard  Patient Status: [facility] - Out-pt  History of Present Illness: Susan Cortez is a 60 y.o. female smoker with history of newly diagnosed small cell lung cancer who presented with right upper lobe suprahilar nodule/adenopathy, right paratracheal lymph node and hypermetabolic right upper lobe nodule.  She has been undergoing chemoradiation.  She presents today for Port-A-Cath placement to assist with additional treatment.  Past Medical History:  Diagnosis Date  . Allergy   . Arthritis   . Basal cell carcinoma 2018   Removed from Right neck per patient   . Diverticulosis 2013  . HLD (hyperlipidemia)   . Hx of adenomatous colonic polyps 2013  . OAB (overactive bladder)   . Pre-diabetes   . Small cell lung cancer ([place]) 11/09/2020    Past Surgical History:  Procedure Laterality Date  . CESAREAN SECTION     X 3  . COLONOSCOPY  2013, 2016, 2019   hx colon polyps  . DILATION AND CURETTAGE OF UTERUS    . FUDUCIAL PLACEMENT Right 10/18/2020   Procedure: PLACEMENT OF FUDUCIAL;  Surgeon: Collene Gobble, MD;  Location: [facility];  Service: Thoracic;  Laterality: Right;  . SHOULDER ARTHROSCOPY WITH OPEN ROTATOR CUFF REPAIR Right 05/2009  . TONSILLECTOMY  1977  . TUBAL LIGATION    . VIDEO BRONCHOSCOPY WITH ENDOBRONCHIAL NAVIGATION N/A 10/18/2020   Procedure: VIDEO BRONCHOSCOPY WITH ENDOBRONCHIAL NAVIGATION;  Surgeon: Collene Gobble, MD;  Location: [facility] OR;  Service: Thoracic;  Laterality: N/A;  . VIDEO BRONCHOSCOPY WITH ENDOBRONCHIAL ULTRASOUND N/A 10/18/2020   Procedure: VIDEO BRONCHOSCOPY WITH ENDOBRONCHIAL ULTRASOUND;  Surgeon: Collene Gobble, MD;  Location: [facility] OR;  Service: Thoracic;  Laterality: N/A;  . WISDOM TOOTH EXTRACTION      Allergies: Lyrica [pregabalin], Butrans [buprenorphine], Chantix  [varenicline], Diclofenac, Meloxicam, Tolmetin, and Nsaids  Medications: Prior to Admission medications   Medication Sig Start Date End Date Taking? Authorizing Provider  acetaminophen-codeine (TYLENOL #4) 300-60 MG tablet Take 1 tablet by mouth daily. 10/14/20  Yes Bayard Hugger, NP  CALCIUM PO Take 650 mg by mouth daily.   Yes [provider]  Cholecalciferol (VITAMIN D3) 125 MCG (5000 UT) CAPS Take 5,000 Units by mouth daily.   Yes [provider]  LORazepam (ATIVAN) 0.5 MG tablet Take 1 tablet (0.5 mg total) by mouth every 8 (eight) hours. 10/26/20  Yes Heilingoetter, Cassandra L, PA-C  lovastatin (MEVACOR) 20 MG tablet TAKE 1 TABLET(20 MG) BY MOUTH AT BEDTIME Patient taking differently: Take 20 mg by mouth at bedtime. 06/09/20  Yes Copland, Gay Filler, MD  MYRBETRIQ 50 MG TB24 tablet TAKE 1 TABLET BY MOUTH EVERY DAY 11/11/20  Yes Copland, Gay Filler, MD  lidocaine-prilocaine (EMLA) cream Apply 1 application topically as needed. 11/21/20   Heilingoetter, Cassandra L, PA-C  prochlorperazine (COMPAZINE) 10 MG tablet Take 1 tablet (10 mg total) by mouth every 6 (six) hours as needed. 11/09/20   Heilingoetter, Cassandra L, PA-C     Family History  Problem Relation Age of Onset  . Diabetes Father   . Heart disease Father   . Hyperlipidemia Father   . Colon cancer Neg Hx   . Colon polyps Neg Hx   . Esophageal cancer Neg Hx   . Rectal cancer Neg Hx   . Stomach cancer Neg Hx     Social History   Socioeconomic History  .  Marital status: Divorced    Spouse name: Not on file  . Number of children: Not on file  . Years of education: Not on file  . Highest education level: Not on file  Occupational History  . Not on file  Tobacco Use  . Smoking status: Current Every Day Smoker    Packs/day: 1.00    Years: 45.00    Pack years: 45.00    Types: Cigarettes  . Smokeless tobacco: Never Used  . Tobacco comment: 1 or more packs daily  Vaping Use  . Vaping Use: Former   Substance and Sexual Activity  . Alcohol use: No  . Drug use: No  . Sexual activity: Not on file    Comment: Tubal Ligation  Other Topics Concern  . Not on file  Social History Narrative  . Not on file   Social Determinants of Health   Financial Resource Strain: Low Risk   . Difficulty of Paying Living Expenses: Not hard at all  Food Insecurity: No Food Insecurity  . Worried About Charity fundraiser in the Last Year: Never true  . Ran Out of Food in the Last Year: Never true  Transportation Needs: No Transportation Needs  . Lack of Transportation (Medical): No  . Lack of Transportation (Non-Medical): No  Physical Activity: Not on file  Stress: Not on file  Social Connections: Not on file      Review of Systems denies fever, headache, chest pain, dyspnea, abdominal/back pain, nausea, vomiting or bleeding.  She does have occasional cough.  Vital Signs: BP (!) 131/91   Pulse 89   Temp 98.2 F (36.8 C) (Oral)   Resp 18   Ht 5\' 8"  (1.727 m)   Wt 217 lb (98.4 kg)   LMP  (LMP Unknown)   SpO2 99%   BMI 32.99 kg/m   Physical Exam awake, alert.  Chest with distant breath sounds bilaterally.  Heart with regular rate and rhythm.  Abdomen soft, positive bowel sounds, nontender.  No significant lower extremity edema.  Imaging: MR Brain W Wo Contrast  Result Date: 11/06/2020 CLINICAL DATA:  Small cell lung cancer. Staging. EXAM: MRI HEAD WITHOUT AND WITH CONTRAST TECHNIQUE: Multiplanar, multiecho pulse sequences of the brain and surrounding structures were obtained without and with intravenous contrast. CONTRAST:  29mL GADAVIST GADOBUTROL 1 MMOL/ML IV SOLN COMPARISON:  None. FINDINGS: Brain: No acute infarct, hemorrhage, or mass lesion is present. No significant white matter lesions are present. The ventricles are of normal size. No significant extraaxial fluid collection is present. The internal auditory canals are within normal limits. Remote lacunar infarcts are present in the  right PICA territory. Brainstem and cerebellum are otherwise within normal limits. Vascular: Flow is present in the major intracranial arteries. Skull and upper cervical spine: The craniocervical junction is normal. Upper cervical spine is within normal limits. Marrow signal is unremarkable. Sinuses/Orbits: The paranasal sinuses and mastoid air cells are clear. The globes and orbits are within normal limits. IMPRESSION: 1. No evidence for metastatic disease to the brain or meninges. 2. Remote lacunar infarcts of the right PICA territory. Electronically Signed   By: San Morelle M.D.   On: 11/06/2020 20:09   NM PET Image Initial (PI) Skull Base To Thigh  Result Date: 11/08/2020 CLINICAL DATA:  Initial treatment strategy for small cell lung cancer. EXAM: NUCLEAR MEDICINE PET SKULL BASE TO THIGH TECHNIQUE: 11.0 mCi F-18 FDG was injected intravenously. Full-ring PET imaging was performed from the skull base to thigh after  the radiotracer. CT data was obtained and used for attenuation correction and anatomic localization. Fasting blood glucose: 115 mg/dl COMPARISON:  CT chest dated 10/05/2020 FINDINGS: Mediastinal blood pool activity: SUV max 2.9 Liver activity: SUV max NA NECK: Diffuse symmetric glottic activity is likely physiologic. There is no CT correlate. No hypermetabolic adenopathy in the neck. Incidental CT findings: 0.8 cm hypodense nodule in the right thyroid gland is not hypermetabolic. Not clinically significant; no follow-up imaging recommended (ref: J Am Coll Radiol. 2015 Feb;12(2): 143-50). CHEST: A right upper lobe suprahilar nodule with maximum SUV of 17.7 measures approximately 2.9 by 1.7 cm. Posteriorly in the right upper lobe, an approximately 1.1 by 0.9 cm nodule has maximum SUV of 4.9, favoring malignancy. Right paratracheal lymph node on image 63 series 4 measures 2.3 cm in short axis and has a maximum SUV of 14.2, compatible with malignancy. Incidental CT findings: Aortic  atherosclerotic calcification. Emphysema. ABDOMEN/PELVIS: Mass of the medial limb left adrenal gland 1.3 by 1.2 cm in diameter, -10 Hounsfield units, compatible with adenoma. Mass of the lateral limb of the left adrenal gland measuring 1.5 by 1.5 cm on image 112 series for, internal density 7 Hounsfield units, compatible with adenoma. There is only low-grade associated metabolic activity. Incidental CT findings: Aortoiliac atherosclerotic vascular disease. SKELETON: No significant abnormal hypermetabolic activity in this region. Incidental CT findings: Degenerative disc disease at L5-S1. IMPRESSION: 1. Central right upper lobe suprahilar nodule/adenopathy with maximum SUV of 17.7. Hypermetabolic large right paratracheal lymph node, and hypermetabolic nodule posteriorly in the right upper lobe. No findings of contralateral or extrathoracic spread. 2. Other imaging findings of potential clinical significance: Left adrenal adenomas. Aortic Atherosclerosis (ICD10-I70.0) and Emphysema (ICD10-J43.9). Electronically Signed   By: Van Clines M.D.   On: 11/08/2020 16:37    Labs:  CBC: Recent Labs    06/14/20 1113 10/18/20 0904 10/26/20 0928 11/15/20 0717 11/21/20 1431  WBC 10.1  --  9.0 9.1 10.1  HGB 14.5 15.6* 14.6 15.2* 14.7  HCT 43.2 46.0 43.8 45.1 43.5  PLT 362  --  394 341 294    COAGS: No results for input(s): INR, APTT in the last 8760 hours.  BMP: Recent Labs    12/14/19 0802 06/08/20 1050 10/18/20 0904 10/26/20 0928 11/15/20 0717 11/21/20 1431  NA 140 138 141 139 140 136  K 4.1 4.5 4.1 3.6 4.0 4.1  CL 103 105 109 107 106 102  CO2 29 25  --  23 22 22   GLUCOSE 116* 94 110* 147* 126* 110*  BUN 7 6* 8 5* 8 15  CALCIUM 9.9 10.0  --  9.6 9.2 9.0  CREATININE 0.74 0.65 0.60 0.82 0.81 0.77  GFRNONAA >60  --   --  >60 >60 >60  GFRAA >60  --   --   --   --   --     LIVER FUNCTION TESTS: Recent Labs    12/14/19 0802 06/08/20 1050 10/26/20 0928 11/15/20 0717 11/21/20 1431   BILITOT 0.5 0.4 0.2* 0.7 0.4  AST 19 23 19 18 22   ALT 21 24 23 20  43  ALKPHOS 51  --  58 54 58  PROT 7.3 7.1 7.3 7.1 7.1  ALBUMIN 4.6  --  4.1 4.1 4.1    TUMOR MARKERS: No results for input(s): AFPTM, CEA, CA199, CHROMGRNA in the last 8760 hours.  Assessment and Plan: 60 y.o. female smoker with history of newly diagnosed small cell lung cancer who presented with right upper lobe suprahilar nodule/adenopathy,  right paratracheal lymph node and hypermetabolic right upper lobe nodule.  She has been undergoing chemoradiation.  She presents today for Port-A-Cath placement to assist with additional treatment.Risks and benefits of image guided port-a-catheter placement was discussed with the patient including, but not limited to bleeding, infection, pneumothorax, or fibrin sheath development and need for additional procedures.  All of the patient's questions were answered, patient is agreeable to proceed. Consent signed and in chart.     Thank you for this interesting consult.  I greatly enjoyed meeting LENELL MCCONNELL and look forward to participating in their care.  A copy of this report was sent to the requesting provider on this date.  Electronically Signed: D. Rowe Robert, PA-C 11/29/2020, 1:45 PM   I spent a total of  25 minutes   in face to face in clinical consultation, greater than 50% of which was counseling/coordinating care for Port-A-Cath placement

## 2020-11-30 ENCOUNTER — Other Ambulatory Visit: Payer: Self-pay

## 2020-11-30 ENCOUNTER — Ambulatory Visit
Admission: RE | Admit: 2020-11-30 | Discharge: 2020-11-30 | Disposition: A | Discharge status: Home / Self Care | Payer: Medicare Other | Source: Ambulatory Visit | Attending: Radiation Oncology | Admitting: Radiation Oncology

## 2020-11-30 DIAGNOSIS — F17210 Nicotine dependence, cigarettes, uncomplicated: Secondary | ICD-10-CM | POA: Diagnosis not present

## 2020-11-30 DIAGNOSIS — Z510 Encounter for antineoplastic radiation therapy: Secondary | ICD-10-CM | POA: Diagnosis not present

## 2020-11-30 DIAGNOSIS — C3411 Malignant neoplasm of upper lobe, right bronchus or lung: Secondary | ICD-10-CM | POA: Diagnosis not present

## 2020-12-01 ENCOUNTER — Ambulatory Visit
Admission: RE | Admit: 2020-12-01 | Discharge: 2020-12-01 | Disposition: A | Discharge status: Home / Self Care | Payer: Medicare Other | Source: Ambulatory Visit | Attending: Radiation Oncology | Admitting: Radiation Oncology

## 2020-12-01 ENCOUNTER — Other Ambulatory Visit: Payer: Self-pay

## 2020-12-01 DIAGNOSIS — C3411 Malignant neoplasm of upper lobe, right bronchus or lung: Secondary | ICD-10-CM | POA: Diagnosis not present

## 2020-12-01 DIAGNOSIS — Z510 Encounter for antineoplastic radiation therapy: Secondary | ICD-10-CM | POA: Diagnosis not present

## 2020-12-01 DIAGNOSIS — F17210 Nicotine dependence, cigarettes, uncomplicated: Secondary | ICD-10-CM | POA: Diagnosis not present

## 2020-12-02 ENCOUNTER — Ambulatory Visit
Admission: RE | Admit: 2020-12-02 | Discharge: 2020-12-02 | Disposition: A | Discharge status: Home / Self Care | Payer: Medicare Other | Source: Ambulatory Visit | Attending: Radiation Oncology | Admitting: Radiation Oncology

## 2020-12-02 DIAGNOSIS — C3411 Malignant neoplasm of upper lobe, right bronchus or lung: Secondary | ICD-10-CM | POA: Diagnosis not present

## 2020-12-02 DIAGNOSIS — F17210 Nicotine dependence, cigarettes, uncomplicated: Secondary | ICD-10-CM | POA: Diagnosis not present

## 2020-12-02 DIAGNOSIS — Z510 Encounter for antineoplastic radiation therapy: Secondary | ICD-10-CM | POA: Diagnosis not present

## 2020-12-05 ENCOUNTER — Inpatient Hospital Stay: Payer: Medicare Other

## 2020-12-05 ENCOUNTER — Encounter: Payer: Self-pay | Admitting: Internal Medicine

## 2020-12-05 ENCOUNTER — Ambulatory Visit
Admission: RE | Admit: 2020-12-05 | Discharge: 2020-12-05 | Disposition: A | Discharge status: Home / Self Care | Payer: Medicare Other | Source: Ambulatory Visit | Attending: Radiation Oncology | Admitting: Radiation Oncology

## 2020-12-05 ENCOUNTER — Other Ambulatory Visit: Payer: Self-pay

## 2020-12-05 ENCOUNTER — Inpatient Hospital Stay: Payer: Medicare Other | Attending: Physician Assistant | Admitting: Internal Medicine

## 2020-12-05 VITALS — BP 142/76 | HR 79 | Temp 98.4°F | Resp 18 | Ht 68.0 in | Wt 219.3 lb

## 2020-12-05 DIAGNOSIS — D3502 Benign neoplasm of left adrenal gland: Secondary | ICD-10-CM | POA: Insufficient documentation

## 2020-12-05 DIAGNOSIS — J439 Emphysema, unspecified: Secondary | ICD-10-CM | POA: Diagnosis not present

## 2020-12-05 DIAGNOSIS — E785 Hyperlipidemia, unspecified: Secondary | ICD-10-CM | POA: Diagnosis not present

## 2020-12-05 DIAGNOSIS — M5137 Other intervertebral disc degeneration, lumbosacral region: Secondary | ICD-10-CM | POA: Insufficient documentation

## 2020-12-05 DIAGNOSIS — C3491 Malignant neoplasm of unspecified part of right bronchus or lung: Secondary | ICD-10-CM | POA: Diagnosis not present

## 2020-12-05 DIAGNOSIS — C3411 Malignant neoplasm of upper lobe, right bronchus or lung: Secondary | ICD-10-CM | POA: Insufficient documentation

## 2020-12-05 DIAGNOSIS — M129 Arthropathy, unspecified: Secondary | ICD-10-CM | POA: Diagnosis not present

## 2020-12-05 DIAGNOSIS — Z85820 Personal history of malignant melanoma of skin: Secondary | ICD-10-CM | POA: Insufficient documentation

## 2020-12-05 DIAGNOSIS — I700 Atherosclerosis of aorta: Secondary | ICD-10-CM | POA: Insufficient documentation

## 2020-12-05 DIAGNOSIS — Z95828 Presence of other vascular implants and grafts: Secondary | ICD-10-CM

## 2020-12-05 DIAGNOSIS — Z86010 Personal history of colonic polyps: Secondary | ICD-10-CM | POA: Diagnosis not present

## 2020-12-05 DIAGNOSIS — Z79899 Other long term (current) drug therapy: Secondary | ICD-10-CM | POA: Diagnosis not present

## 2020-12-05 DIAGNOSIS — I6381 Other cerebral infarction due to occlusion or stenosis of small artery: Secondary | ICD-10-CM | POA: Diagnosis not present

## 2020-12-05 DIAGNOSIS — Z510 Encounter for antineoplastic radiation therapy: Secondary | ICD-10-CM | POA: Diagnosis not present

## 2020-12-05 DIAGNOSIS — F17210 Nicotine dependence, cigarettes, uncomplicated: Secondary | ICD-10-CM | POA: Insufficient documentation

## 2020-12-05 DIAGNOSIS — Z5111 Encounter for antineoplastic chemotherapy: Secondary | ICD-10-CM | POA: Insufficient documentation

## 2020-12-05 DIAGNOSIS — C3490 Malignant neoplasm of unspecified part of unspecified bronchus or lung: Secondary | ICD-10-CM

## 2020-12-05 LAB — CMP (CANCER CENTER ONLY)
ALT: 16 U/L (ref 0–44)
AST: 17 U/L (ref 15–41)
Albumin: 3.8 g/dL (ref 3.5–5.0)
Alkaline Phosphatase: 60 U/L (ref 38–126)
Anion gap: 10 (ref 5–15)
BUN: 10 mg/dL (ref 8–23)
CO2: 23 mmol/L (ref 22–32)
Calcium: 9.3 mg/dL (ref 8.9–10.3)
Chloride: 107 mmol/L (ref 98–111)
Creatinine: 0.72 mg/dL (ref 0.44–1.00)
GFR, Estimated: >60 mL/min (ref >60)
Glucose, Bld: 111 mg/dL — ABNORMAL HIGH (ref 70–99)
Potassium: 4 mmol/L (ref 3.5–5.1)
Sodium: 140 mmol/L (ref 135–145)
Total Bilirubin: <0.2 mg/dL — ABNORMAL LOW (ref 0.3–1.2)
Total Protein: 6.6 g/dL (ref 6.5–8.1)

## 2020-12-05 LAB — CBC WITH DIFFERENTIAL (CANCER CENTER ONLY)
Abs Immature Granulocytes: 0.08 10*3/uL — ABNORMAL HIGH (ref 0.00–0.07)
Basophils Absolute: 0 10*3/uL (ref 0.0–0.1)
Basophils Relative: 2 %
Eosinophils Absolute: 0 10*3/uL (ref 0.0–0.5)
Eosinophils Relative: 1 %
HCT: 35 % — ABNORMAL LOW (ref 36.0–46.0)
Hemoglobin: 12.1 g/dL (ref 12.0–15.0)
Immature Granulocytes: 3 %
Lymphocytes Relative: 47 %
Lymphs Abs: 1.3 10*3/uL (ref 0.7–4.0)
MCH: 32.1 pg (ref 26.0–34.0)
MCHC: 34.6 g/dL (ref 30.0–36.0)
MCV: 92.8 fL (ref 80.0–100.0)
Monocytes Absolute: 0.6 10*3/uL (ref 0.1–1.0)
Monocytes Relative: 22 %
Neutro Abs: 0.6 10*3/uL — ABNORMAL LOW (ref 1.7–7.7)
Neutrophils Relative %: 25 %
Platelet Count: 302 10*3/uL (ref 150–400)
RBC: 3.77 MIL/uL — ABNORMAL LOW (ref 3.87–5.11)
RDW: 13.4 % (ref 11.5–15.5)
WBC Count: 2.6 10*3/uL — ABNORMAL LOW (ref 4.0–10.5)
nRBC: 0 % (ref 0.0–0.2)

## 2020-12-05 MED ORDER — SODIUM CHLORIDE 0.9% FLUSH
10.0000 mL | INTRAVENOUS | Status: DC | PRN
  Administered 2020-12-05: 10 mL via INTRAVENOUS
  Filled 2020-12-05: qty 10

## 2020-12-05 MED ORDER — SODIUM CHLORIDE 0.9% FLUSH
10.0000 mL | INTRAVENOUS | Status: DC | PRN
  Filled 2020-12-05: qty 10

## 2020-12-05 MED ORDER — HEPARIN SOD (PORK) LOCK FLUSH 100 UNIT/ML IV SOLN
500.0000 [IU] | Freq: Once | INTRAVENOUS | Status: AC
  Administered 2020-12-05: 500 [IU] via INTRAVENOUS
  Filled 2020-12-05: qty 5

## 2020-12-05 MED ORDER — HEPARIN SOD (PORK) LOCK FLUSH 100 UNIT/ML IV SOLN
500.0000 [IU] | Freq: Once | INTRAVENOUS | Status: DC
  Filled 2020-12-05: qty 5

## 2020-12-05 NOTE — Progress Notes (Signed)
[facility] [phone]   [phone]  OFFICE PROGRESS NOTE  Copland, Gay Filler, MD [address] [place] [address]   DIAGNOSIS: Limitedstage (T1c, N2, M0) Small Cell Lung Cancer. She presented with aright upper lobe suprahilar nodule/adenopathy, right paratracheal lymph node, and a hypermetabolic right upper lobe nodule.She was diagnosed in February2022.   PRIOR THERAPY: None  CURRENT THERAPY: Systemic chemotherapy with cisplatin 80 mg per metered squared on days 1, etoposide 100 mg per metered squared on days 1, 2, and 3 IV every 3 weeks with concurrent radiation. First dose expected on 11/15/2020. Status post 1 cycle.  INTERVAL HISTORY: Susan Cortez 60 y.o. female returns to the clinic today for follow-up visit.  The patient is feeling fine today with no concerning complaints except for the hair loss.  She denied having any current chest pain, shortness of breath, cough or hemoptysis.  She denied having any fever or chills.  She has no nausea, vomiting, diarrhea or constipation.  She has no weight loss or night sweats.  Unfortunately she continues to smoke 0.5 pack of cigarette every day.  She is here today for evaluation before starting cycle #2.  MEDICAL HISTORY: Past Medical History:  Diagnosis Date  . Allergy   . Arthritis   . Basal cell carcinoma 2018   Removed from Right neck per patient   . Diverticulosis 2013  . HLD (hyperlipidemia)   . Hx of adenomatous colonic polyps 2013  . OAB (overactive bladder)   . Pre-diabetes   . Small cell lung cancer ([facility]) 11/09/2020    ALLERGIES:  is allergic to lyrica [pregabalin], butrans [buprenorphine], chantix [varenicline], diclofenac, meloxicam, tolmetin, and nsaids.  MEDICATIONS:  Current Outpatient Medications  Medication Sig Dispense Refill  . acetaminophen-codeine (TYLENOL #4) 300-60 MG tablet Take 1 tablet by mouth daily. 30 tablet 2  . CALCIUM PO Take 650 mg  by mouth daily.    . Cholecalciferol (VITAMIN D3) 125 MCG (5000 UT) CAPS Take 5,000 Units by mouth daily.    Marland Kitchen lidocaine-prilocaine (EMLA) cream Apply 1 application topically as needed. 30 g 0  . LORazepam (ATIVAN) 0.5 MG tablet Take 1 tablet (0.5 mg total) by mouth every 8 (eight) hours. 4 tablet 0  . lovastatin (MEVACOR) 20 MG tablet TAKE 1 TABLET(20 MG) BY MOUTH AT BEDTIME (Patient taking differently: Take 20 mg by mouth at bedtime.) 90 tablet 3  . MYRBETRIQ 50 MG TB24 tablet TAKE 1 TABLET BY MOUTH EVERY DAY 30 tablet 0  . prochlorperazine (COMPAZINE) 10 MG tablet Take 1 tablet (10 mg total) by mouth every 6 (six) hours as needed. 30 tablet 2   No current facility-administered medications for this visit.    SURGICAL HISTORY:  Past Surgical History:  Procedure Laterality Date  . CESAREAN SECTION     X 3  . COLONOSCOPY  2013, 2016, 2019   hx colon polyps  . DILATION AND CURETTAGE OF UTERUS    . FUDUCIAL PLACEMENT Right 10/18/2020   Procedure: PLACEMENT OF FUDUCIAL;  Surgeon: Collene Gobble, MD;  Location: [facility];  Service: Thoracic;  Laterality: Right;  . IR IMAGING GUIDED PORT INSERTION  11/29/2020  . SHOULDER ARTHROSCOPY WITH OPEN ROTATOR CUFF REPAIR Right 05/2009  . TONSILLECTOMY  1977  . TUBAL LIGATION    . VIDEO BRONCHOSCOPY WITH ENDOBRONCHIAL NAVIGATION N/A 10/18/2020   Procedure: VIDEO BRONCHOSCOPY WITH ENDOBRONCHIAL NAVIGATION;  Surgeon: Collene Gobble, MD;  Location: [facility];  Service: Thoracic;  Laterality: N/A;  . VIDEO BRONCHOSCOPY WITH ENDOBRONCHIAL ULTRASOUND N/A 10/18/2020   Procedure: VIDEO BRONCHOSCOPY WITH ENDOBRONCHIAL ULTRASOUND;  Surgeon: Collene Gobble, MD;  Location: [facility];  Service: Thoracic;  Laterality: N/A;  . WISDOM TOOTH EXTRACTION      REVIEW OF SYSTEMS:  A comprehensive review of systems was negative except for: Constitutional: positive for fatigue   PHYSICAL EXAMINATION: General appearance: alert, cooperative, fatigued and no distress Head:  Normocephalic, without obvious abnormality, atraumatic Neck: no adenopathy, no JVD, supple, symmetrical, trachea midline and thyroid not enlarged, symmetric, no tenderness/mass/nodules Lymph nodes: Cervical, supraclavicular, and axillary nodes normal. Resp: clear to auscultation bilaterally Back: symmetric, no curvature. ROM normal. No CVA tenderness. Cardio: regular rate and rhythm, S1, S2 normal, no murmur, click, rub or gallop GI: soft, non-tender; bowel sounds normal; no masses,  no organomegaly Extremities: extremities normal, atraumatic, no cyanosis or edema  ECOG PERFORMANCE STATUS: 1 - Symptomatic but completely ambulatory  Blood pressure (!) 142/76, pulse 79, temperature 98.4 F (36.9 C), resp. rate 18, height 5\' 8"  (1.727 m), weight 219 lb 4.8 oz (99.5 kg), SpO2 100 %.  LABORATORY DATA: Lab Results  Component Value Date   WBC 2.6 (L) 12/05/2020   HGB 12.1 12/05/2020   HCT 35.0 (L) 12/05/2020   MCV 92.8 12/05/2020   PLT 302 12/05/2020      Chemistry      Component Value Date/Time   NA 140 12/05/2020 1048   K 4.0 12/05/2020 1048   CL 107 12/05/2020 1048   CO2 23 12/05/2020 1048   BUN 10 12/05/2020 1048   CREATININE 0.72 12/05/2020 1048   CREATININE 0.65 06/08/2020 1050      Component Value Date/Time   CALCIUM 9.3 12/05/2020 1048   ALKPHOS 60 12/05/2020 1048   AST 17 12/05/2020 1048   ALT 16 12/05/2020 1048   BILITOT <0.2 (L) 12/05/2020 1048       RADIOGRAPHIC STUDIES: MR Brain W Wo Contrast  Result Date: 11/06/2020 CLINICAL DATA:  Small cell lung cancer. Staging. EXAM: MRI HEAD WITHOUT AND WITH CONTRAST TECHNIQUE: Multiplanar, multiecho pulse sequences of the brain and surrounding structures were obtained without and with intravenous contrast. CONTRAST:  18mL GADAVIST GADOBUTROL 1 MMOL/ML IV SOLN COMPARISON:  None. FINDINGS: Brain: No acute infarct, hemorrhage, or mass lesion is present. No significant white matter lesions are present. The ventricles are of  normal size. No significant extraaxial fluid collection is present. The internal auditory canals are within normal limits. Remote lacunar infarcts are present in the right PICA territory. Brainstem and cerebellum are otherwise within normal limits. Vascular: Flow is present in the major intracranial arteries. Skull and upper cervical spine: The craniocervical junction is normal. Upper cervical spine is within normal limits. Marrow signal is unremarkable. Sinuses/Orbits: The paranasal sinuses and mastoid air cells are clear. The globes and orbits are within normal limits. IMPRESSION: 1. No evidence for metastatic disease to the brain or meninges. 2. Remote lacunar infarcts of the right PICA territory. Electronically Signed   By: San Morelle M.D.   On: 11/06/2020 20:09   NM PET Image Initial (PI) Skull Base To Thigh  Result Date: 11/08/2020 CLINICAL DATA:  Initial treatment strategy for small cell lung cancer. EXAM: NUCLEAR MEDICINE PET SKULL BASE TO THIGH TECHNIQUE: 11.0 mCi F-18 FDG was injected intravenously. Full-ring PET imaging was performed from the skull base to thigh after the radiotracer. CT data was obtained and used for attenuation correction and anatomic localization. Fasting blood glucose:  115 mg/dl COMPARISON:  CT chest dated 10/05/2020 FINDINGS: Mediastinal blood pool activity: SUV max 2.9 Liver activity: SUV max NA NECK: Diffuse symmetric glottic activity is likely physiologic. There is no CT correlate. No hypermetabolic adenopathy in the neck. Incidental CT findings: 0.8 cm hypodense nodule in the right thyroid gland is not hypermetabolic. Not clinically significant; no follow-up imaging recommended (ref: J Am Coll Radiol. 2015 Feb;12(2): 143-50). CHEST: A right upper lobe suprahilar nodule with maximum SUV of 17.7 measures approximately 2.9 by 1.7 cm. Posteriorly in the right upper lobe, an approximately 1.1 by 0.9 cm nodule has maximum SUV of 4.9, favoring malignancy. Right  paratracheal lymph node on image 63 series 4 measures 2.3 cm in short axis and has a maximum SUV of 14.2, compatible with malignancy. Incidental CT findings: Aortic atherosclerotic calcification. Emphysema. ABDOMEN/PELVIS: Mass of the medial limb left adrenal gland 1.3 by 1.2 cm in diameter, -10 Hounsfield units, compatible with adenoma. Mass of the lateral limb of the left adrenal gland measuring 1.5 by 1.5 cm on image 112 series for, internal density 7 Hounsfield units, compatible with adenoma. There is only low-grade associated metabolic activity. Incidental CT findings: Aortoiliac atherosclerotic vascular disease. SKELETON: No significant abnormal hypermetabolic activity in this region. Incidental CT findings: Degenerative disc disease at L5-S1. IMPRESSION: 1. Central right upper lobe suprahilar nodule/adenopathy with maximum SUV of 17.7. Hypermetabolic large right paratracheal lymph node, and hypermetabolic nodule posteriorly in the right upper lobe. No findings of contralateral or extrathoracic spread. 2. Other imaging findings of potential clinical significance: Left adrenal adenomas. Aortic Atherosclerosis (ICD10-I70.0) and Emphysema (ICD10-J43.9). Electronically Signed   By: Van Clines M.D.   On: 11/08/2020 16:37   IR IMAGING GUIDED PORT INSERTION  Result Date: 11/29/2020 CLINICAL DATA:  Small-cell lung carcinoma and need for porta cath for chemotherapy. EXAM: IMPLANTED PORT A CATH PLACEMENT WITH ULTRASOUND AND FLUOROSCOPIC GUIDANCE ANESTHESIA/SEDATION: 4.0 mg IV Versed; 100 mcg IV Fentanyl Total Moderate Sedation Time:  36 minutes The patient's level of consciousness and physiologic status were continuously monitored during the procedure by Radiology nursing. FLUOROSCOPY TIME:  12 seconds.  4.0 mGy. PROCEDURE: The procedure, risks, benefits, and alternatives were explained to the patient. Questions regarding the procedure were encouraged and answered. The patient understands and consents to the  procedure. A time-out was performed prior to initiating the procedure. Ultrasound was utilized to confirm patency of the right internal jugular vein. The right neck and chest were prepped with chlorhexidine in a sterile fashion, and a sterile drape was applied covering the operative field. Maximum barrier sterile technique with sterile gowns and gloves were used for the procedure. Local anesthesia was provided with 1% lidocaine. After creating a small venotomy incision, a 21 gauge needle was advanced into the right internal jugular vein under direct, real-time ultrasound guidance. Ultrasound image documentation was performed. After securing guidewire access, an 8 Fr dilator was placed. A J-wire was kinked to measure appropriate catheter length. A subcutaneous port pocket was then created along the upper chest wall utilizing sharp and blunt dissection. Portable cautery was utilized. The pocket was irrigated with sterile saline. A single lumen power injectable port was chosen for placement. The 8 Fr catheter was tunneled from the port pocket site to the venotomy incision. The port was placed in the pocket. External catheter was trimmed to appropriate length based on guidewire measurement. At the venotomy, an 8 Fr peel-away sheath was placed over a guidewire. The catheter was then placed through the sheath and the sheath removed.  Final catheter positioning was confirmed and documented with a fluoroscopic spot image. The port was accessed with a needle and aspirated and flushed with heparinized saline. The access needle was removed. The venotomy and port pocket incisions were closed with subcutaneous 3-0 Monocryl and subcuticular 4-0 Vicryl. Dermabond was applied to both incisions. COMPLICATIONS: COMPLICATIONS None FINDINGS: After catheter placement, the tip lies at the cavo-atrial junction. The catheter aspirates normally and is ready for immediate use. IMPRESSION: Placement of single lumen port a cath via right  internal jugular vein. The catheter tip lies at the cavo-atrial junction. A power injectable port a cath was placed and is ready for immediate use. Electronically Signed   By: Aletta Edouard M.D.   On: 11/29/2020 16:38    ASSESSMENT AND PLAN: This is a very pleasant 60 years old white female with limited stage (T1c, N2, M0) small cell lung cancer presented with right upper lobe and suprahilar mass in addition to mediastinal lymphadenopathy diagnosed in February 2022.  The patient is currently undergoing systemic chemotherapy with cisplatin 80 mg/M2 on day 1 and etoposide 100 mg/M2 on days 1, 2 and 3 every 3 weeks status post 1 cycle. The patient tolerated the first cycle of her treatment well except for fatigue and alopecia. She was supposed to start cycle #2 of her treatment today but her absolute neutrophil count is low and not appropriate to proceed with the treatment. I recommended for her to delay the start of cycle number 2 x 1 week until improvement of her absolute neutrophil count. I will see the patient back for follow-up visit in 4 weeks for evaluation before starting cycle #3. The patient was advised to call immediately if she has any concerning symptoms in the interval. The patient voices understanding of current disease status and treatment options and is in agreement with the current care plan.  All questions were answered. The patient knows to call the clinic with any problems, questions or concerns. We can certainly see the patient much sooner if necessary.  The total time spent in the appointment was 20 minutes.  Disclaimer: This note was dictated with voice recognition software. Similar sounding words can inadvertently be transcribed and may not be corrected upon review.

## 2020-12-05 NOTE — Patient Instructions (Signed)
Implanted Port Insertion, Care After This sheet gives you information about how to care for yourself after your procedure. Your health care provider may also give you more specific instructions. If you have problems or questions, contact your health care provider. What can I expect after the procedure? After the procedure, it is common to have:  Discomfort at the port insertion site.  Bruising on the skin over the port. This should improve over 3-4 days. Follow these instructions at home: Port care  After your port is placed, you will get a manufacturer's information card. The card has information about your port. Keep this card with you at all times.  Take care of the port as told by your health care provider. Ask your health care provider if you or a family member can get training for taking care of the port at home. A home health care nurse may also take care of the port.  Make sure to remember what type of port you have. Incision care  Follow instructions from your health care provider about how to take care of your port insertion site. Make sure you: ? Wash your hands with soap and water before and after you change your bandage (dressing). If soap and water are not available, use hand sanitizer. ? Change your dressing as told by your health care provider. ? Leave stitches (sutures), skin glue, or adhesive strips in place. These skin closures may need to stay in place for 2 weeks or longer. If adhesive strip edges start to loosen and curl up, you may trim the loose edges. Do not remove adhesive strips completely unless your health care provider tells you to do that.  Check your port insertion site every day for signs of infection. Check for: ? Redness, swelling, or pain. ? Fluid or blood. ? Warmth. ? Pus or a bad smell.      Activity  Return to your normal activities as told by your health care provider. Ask your health care provider what activities are safe for you.  Do not  lift anything that is heavier than 10 lb (4.5 kg), or the limit that you are told, until your health care provider says that it is safe. General instructions  Take over-the-counter and prescription medicines only as told by your health care provider.  Do not take baths, swim, or use a hot tub until your health care provider approves. Ask your health care provider if you may take showers. You may only be allowed to take sponge baths.  Do not drive for 24 hours if you were given a sedative during your procedure.  Wear a medical alert bracelet in case of an emergency. This will tell any health care providers that you have a port.  Keep all follow-up visits as told by your health care provider. This is important. Contact a health care provider if:  You cannot flush your port with saline as directed, or you cannot draw blood from the port.  You have a fever or chills.  You have redness, swelling, or pain around your port insertion site.  You have fluid or blood coming from your port insertion site.  Your port insertion site feels warm to the touch.  You have pus or a bad smell coming from the port insertion site. Get help right away if:  You have chest pain or shortness of breath.  You have bleeding from your port that you cannot control. Summary  Take care of the port as told by your   health care provider. Keep the manufacturer's information card with you at all times.  Change your dressing as told by your health care provider.  Contact a health care provider if you have a fever or chills or if you have redness, swelling, or pain around your port insertion site.  Keep all follow-up visits as told by your health care provider. This information is not intended to replace advice given to you by your health care provider. Make sure you discuss any questions you have with your health care provider. Document Revised: 03/11/2018 Document Reviewed: 03/11/2018 Elsevier Patient Education   2021 Elsevier Inc.  

## 2020-12-05 NOTE — Patient Instructions (Signed)
Steps to Quit Smoking Smoking tobacco is the leading cause of preventable death. It can affect almost every organ in the body. Smoking puts you and people around you at risk for many serious, long-lasting (chronic) diseases. Quitting smoking can be hard, but it is one of the best things that you can do for your health. It is never too late to quit. How do I get ready to quit? When you decide to quit smoking, make a plan to help you succeed. Before you quit:  Pick a date to quit. Set a date within the next 2 weeks to give you time to prepare.  Write down the reasons why you are quitting. Keep this list in places where you will see it often.  Tell your family, friends, and co-workers that you are quitting. Their support is important.  Talk with your doctor about the choices that may help you quit.  Find out if your health insurance will pay for these treatments.  Know the people, places, things, and activities that make you want to smoke (triggers). Avoid them. What first steps can I take to quit smoking?  Throw away all cigarettes at home, at work, and in your car.  Throw away the things that you use when you smoke, such as ashtrays and lighters.  Clean your car. Make sure to empty the ashtray.  Clean your home, including curtains and carpets. What can I do to help me quit smoking? Talk with your doctor about taking medicines and seeing a counselor at the same time. You are more likely to succeed when you do both.  If you are pregnant or breastfeeding, talk with your doctor about counseling or other ways to quit smoking. Do not take medicine to help you quit smoking unless your doctor tells you to do so. To quit smoking: Quit right away  Quit smoking totally, instead of slowly cutting back on how much you smoke over a period of time.  Go to counseling. You are more likely to quit if you go to counseling sessions regularly. Take medicine You may take medicines to help you quit. Some  medicines need a prescription, and some you can buy over-the-counter. Some medicines may contain a drug called nicotine to replace the nicotine in cigarettes. Medicines may:  Help you to stop having the desire to smoke (cravings).  Help to stop the problems that come when you stop smoking (withdrawal symptoms). Your doctor may ask you to use:  Nicotine patches, gum, or lozenges.  Nicotine inhalers or sprays.  Non-nicotine medicine that is taken by mouth. Find resources Find resources and other ways to help you quit smoking and remain smoke-free after you quit. These resources are most helpful when you use them often. They include:  Online chats with a counselor.  Phone quitlines.  Printed self-help materials.  Support groups or group counseling.  Text messaging programs.  Mobile phone apps. Use apps on your mobile phone or tablet that can help you stick to your quit plan. There are many free apps for mobile phones and tablets as well as websites. Examples include Quit Guide from the CDC and smokefree.gov   What things can I do to make it easier to quit?  Talk to your family and friends. Ask them to support and encourage you.  Call a phone quitline (1-800-QUIT-NOW), reach out to support groups, or work with a counselor.  Ask people who smoke to not smoke around you.  Avoid places that make you want to smoke,   such as: ? Bars. ? Parties. ? Smoke-break areas at work.  Spend time with people who do not smoke.  Lower the stress in your life. Stress can make you want to smoke. Try these things to help your stress: ? Getting regular exercise. ? Doing deep-breathing exercises. ? Doing yoga. ? Meditating. ? Doing a body scan. To do this, close your eyes, focus on one area of your body at a time from head to toe. Notice which parts of your body are tense. Try to relax the muscles in those areas.   How will I feel when I quit smoking? Day 1 to 3 weeks Within the first 24 hours,  you may start to have some problems that come from quitting tobacco. These problems are very bad 2-3 days after you quit, but they do not often last for more than 2-3 weeks. You may get these symptoms:  Mood swings.  Feeling restless, nervous, angry, or annoyed.  Trouble concentrating.  Dizziness.  Strong desire for high-sugar foods and nicotine.  Weight gain.  Trouble pooping (constipation).  Feeling like you may vomit (nausea).  Coughing or a sore throat.  Changes in how the medicines that you take for other issues work in your body.  Depression.  Trouble sleeping (insomnia). Week 3 and afterward After the first 2-3 weeks of quitting, you may start to notice more positive results, such as:  Better sense of smell and taste.  Less coughing and sore throat.  Slower heart rate.  Lower blood pressure.  Clearer skin.  Better breathing.  Fewer sick days. Quitting smoking can be hard. Do not give up if you fail the first time. Some people need to try a few times before they succeed. Do your best to stick to your quit plan, and talk with your doctor if you have any questions or concerns. Summary  Smoking tobacco is the leading cause of preventable death. Quitting smoking can be hard, but it is one of the best things that you can do for your health.  When you decide to quit smoking, make a plan to help you succeed.  Quit smoking right away, not slowly over a period of time.  When you start quitting, seek help from your doctor, family, or friends. This information is not intended to replace advice given to you by your health care provider. Make sure you discuss any questions you have with your health care provider. Document Revised: 05/08/2019 Document Reviewed: 11/01/2018 Elsevier Patient Education  2021 Elsevier Inc.  

## 2020-12-06 ENCOUNTER — Inpatient Hospital Stay: Payer: Medicare Other

## 2020-12-06 ENCOUNTER — Ambulatory Visit
Admission: RE | Admit: 2020-12-06 | Discharge: 2020-12-06 | Disposition: A | Discharge status: Home / Self Care | Payer: Medicare Other | Source: Ambulatory Visit | Attending: Radiation Oncology | Admitting: Radiation Oncology

## 2020-12-06 DIAGNOSIS — C3411 Malignant neoplasm of upper lobe, right bronchus or lung: Secondary | ICD-10-CM | POA: Diagnosis not present

## 2020-12-06 DIAGNOSIS — Z510 Encounter for antineoplastic radiation therapy: Secondary | ICD-10-CM | POA: Diagnosis not present

## 2020-12-06 DIAGNOSIS — F17210 Nicotine dependence, cigarettes, uncomplicated: Secondary | ICD-10-CM | POA: Diagnosis not present

## 2020-12-07 ENCOUNTER — Other Ambulatory Visit: Payer: Self-pay

## 2020-12-07 ENCOUNTER — Inpatient Hospital Stay: Payer: Medicare Other

## 2020-12-07 ENCOUNTER — Ambulatory Visit
Admission: RE | Admit: 2020-12-07 | Discharge: 2020-12-07 | Disposition: A | Discharge status: Home / Self Care | Payer: Medicare Other | Source: Ambulatory Visit | Attending: Radiation Oncology | Admitting: Radiation Oncology

## 2020-12-07 DIAGNOSIS — F17210 Nicotine dependence, cigarettes, uncomplicated: Secondary | ICD-10-CM | POA: Diagnosis not present

## 2020-12-07 DIAGNOSIS — C3411 Malignant neoplasm of upper lobe, right bronchus or lung: Secondary | ICD-10-CM | POA: Diagnosis not present

## 2020-12-07 DIAGNOSIS — Z510 Encounter for antineoplastic radiation therapy: Secondary | ICD-10-CM | POA: Diagnosis not present

## 2020-12-08 ENCOUNTER — Ambulatory Visit
Admission: RE | Admit: 2020-12-08 | Discharge: 2020-12-08 | Disposition: A | Discharge status: Home / Self Care | Payer: Medicare Other | Source: Ambulatory Visit | Attending: Radiation Oncology | Admitting: Radiation Oncology

## 2020-12-08 ENCOUNTER — Inpatient Hospital Stay: Payer: Medicare Other

## 2020-12-08 DIAGNOSIS — F17210 Nicotine dependence, cigarettes, uncomplicated: Secondary | ICD-10-CM | POA: Diagnosis not present

## 2020-12-08 DIAGNOSIS — Z510 Encounter for antineoplastic radiation therapy: Secondary | ICD-10-CM | POA: Diagnosis not present

## 2020-12-08 DIAGNOSIS — C3411 Malignant neoplasm of upper lobe, right bronchus or lung: Secondary | ICD-10-CM | POA: Diagnosis not present

## 2020-12-09 ENCOUNTER — Other Ambulatory Visit: Payer: Self-pay

## 2020-12-09 ENCOUNTER — Other Ambulatory Visit: Payer: Self-pay | Admitting: Radiation Oncology

## 2020-12-09 ENCOUNTER — Ambulatory Visit
Admission: RE | Admit: 2020-12-09 | Discharge: 2020-12-09 | Disposition: A | Discharge status: Home / Self Care | Payer: Medicare Other | Source: Ambulatory Visit | Attending: Radiation Oncology | Admitting: Radiation Oncology

## 2020-12-09 DIAGNOSIS — C3411 Malignant neoplasm of upper lobe, right bronchus or lung: Secondary | ICD-10-CM | POA: Diagnosis not present

## 2020-12-09 DIAGNOSIS — F17210 Nicotine dependence, cigarettes, uncomplicated: Secondary | ICD-10-CM | POA: Diagnosis not present

## 2020-12-09 DIAGNOSIS — Z510 Encounter for antineoplastic radiation therapy: Secondary | ICD-10-CM | POA: Diagnosis not present

## 2020-12-11 ENCOUNTER — Other Ambulatory Visit: Payer: Self-pay | Admitting: Family Medicine

## 2020-12-11 DIAGNOSIS — R350 Frequency of micturition: Secondary | ICD-10-CM

## 2020-12-12 ENCOUNTER — Ambulatory Visit
Admission: RE | Admit: 2020-12-12 | Discharge: 2020-12-12 | Disposition: A | Discharge status: Home / Self Care | Payer: Medicare Other | Source: Ambulatory Visit | Attending: Radiation Oncology | Admitting: Radiation Oncology

## 2020-12-12 ENCOUNTER — Other Ambulatory Visit: Payer: Self-pay

## 2020-12-12 DIAGNOSIS — C3411 Malignant neoplasm of upper lobe, right bronchus or lung: Secondary | ICD-10-CM | POA: Diagnosis not present

## 2020-12-12 DIAGNOSIS — Z510 Encounter for antineoplastic radiation therapy: Secondary | ICD-10-CM | POA: Diagnosis not present

## 2020-12-12 DIAGNOSIS — F17210 Nicotine dependence, cigarettes, uncomplicated: Secondary | ICD-10-CM | POA: Diagnosis not present

## 2020-12-13 ENCOUNTER — Other Ambulatory Visit: Payer: Self-pay | Admitting: *Deleted

## 2020-12-13 ENCOUNTER — Ambulatory Visit
Admission: RE | Admit: 2020-12-13 | Discharge: 2020-12-13 | Disposition: A | Discharge status: Home / Self Care | Payer: Medicare Other | Source: Ambulatory Visit | Attending: Radiation Oncology | Admitting: Radiation Oncology

## 2020-12-13 ENCOUNTER — Inpatient Hospital Stay: Payer: Medicare Other

## 2020-12-13 ENCOUNTER — Other Ambulatory Visit: Payer: Self-pay | Admitting: Medical Oncology

## 2020-12-13 VITALS — BP 108/60 | HR 88 | Temp 98.4°F | Resp 20

## 2020-12-13 DIAGNOSIS — M129 Arthropathy, unspecified: Secondary | ICD-10-CM | POA: Diagnosis not present

## 2020-12-13 DIAGNOSIS — C3491 Malignant neoplasm of unspecified part of right bronchus or lung: Secondary | ICD-10-CM

## 2020-12-13 DIAGNOSIS — I6381 Other cerebral infarction due to occlusion or stenosis of small artery: Secondary | ICD-10-CM | POA: Diagnosis not present

## 2020-12-13 DIAGNOSIS — Z86010 Personal history of colonic polyps: Secondary | ICD-10-CM | POA: Diagnosis not present

## 2020-12-13 DIAGNOSIS — C3411 Malignant neoplasm of upper lobe, right bronchus or lung: Secondary | ICD-10-CM | POA: Diagnosis not present

## 2020-12-13 DIAGNOSIS — Z85820 Personal history of malignant melanoma of skin: Secondary | ICD-10-CM | POA: Diagnosis not present

## 2020-12-13 DIAGNOSIS — E785 Hyperlipidemia, unspecified: Secondary | ICD-10-CM | POA: Diagnosis not present

## 2020-12-13 DIAGNOSIS — I700 Atherosclerosis of aorta: Secondary | ICD-10-CM | POA: Diagnosis not present

## 2020-12-13 DIAGNOSIS — Z5111 Encounter for antineoplastic chemotherapy: Secondary | ICD-10-CM | POA: Diagnosis not present

## 2020-12-13 DIAGNOSIS — Z510 Encounter for antineoplastic radiation therapy: Secondary | ICD-10-CM | POA: Diagnosis not present

## 2020-12-13 DIAGNOSIS — Z79899 Other long term (current) drug therapy: Secondary | ICD-10-CM | POA: Diagnosis not present

## 2020-12-13 DIAGNOSIS — J439 Emphysema, unspecified: Secondary | ICD-10-CM | POA: Diagnosis not present

## 2020-12-13 DIAGNOSIS — F17210 Nicotine dependence, cigarettes, uncomplicated: Secondary | ICD-10-CM | POA: Diagnosis not present

## 2020-12-13 DIAGNOSIS — C3490 Malignant neoplasm of unspecified part of unspecified bronchus or lung: Secondary | ICD-10-CM

## 2020-12-13 DIAGNOSIS — D3502 Benign neoplasm of left adrenal gland: Secondary | ICD-10-CM | POA: Diagnosis not present

## 2020-12-13 DIAGNOSIS — M5137 Other intervertebral disc degeneration, lumbosacral region: Secondary | ICD-10-CM | POA: Diagnosis not present

## 2020-12-13 LAB — CBC WITH DIFFERENTIAL (CANCER CENTER ONLY)
Abs Immature Granulocytes: 0.04 10*3/uL (ref 0.00–0.07)
Basophils Absolute: 0.1 10*3/uL (ref 0.0–0.1)
Basophils Relative: 1 %
Eosinophils Absolute: 0 10*3/uL (ref 0.0–0.5)
Eosinophils Relative: 1 %
HCT: 36.2 % (ref 36.0–46.0)
Hemoglobin: 12.5 g/dL (ref 12.0–15.0)
Immature Granulocytes: 1 %
Lymphocytes Relative: 14 %
Lymphs Abs: 1 10*3/uL (ref 0.7–4.0)
MCH: 32.1 pg (ref 26.0–34.0)
MCHC: 34.5 g/dL (ref 30.0–36.0)
MCV: 92.8 fL (ref 80.0–100.0)
Monocytes Absolute: 0.6 10*3/uL (ref 0.1–1.0)
Monocytes Relative: 9 %
Neutro Abs: 5.2 10*3/uL (ref 1.7–7.7)
Neutrophils Relative %: 74 %
Platelet Count: 267 10*3/uL (ref 150–400)
RBC: 3.9 MIL/uL (ref 3.87–5.11)
RDW: 14 % (ref 11.5–15.5)
WBC Count: 7 10*3/uL (ref 4.0–10.5)
nRBC: 0 % (ref 0.0–0.2)

## 2020-12-13 LAB — CMP (CANCER CENTER ONLY)
ALT: 18 U/L (ref 0–44)
AST: 19 U/L (ref 15–41)
Albumin: 4 g/dL (ref 3.5–5.0)
Alkaline Phosphatase: 51 U/L (ref 38–126)
Anion gap: 8 (ref 5–15)
BUN: 7 mg/dL — ABNORMAL LOW (ref 8–23)
CO2: 27 mmol/L (ref 22–32)
Calcium: 9.3 mg/dL (ref 8.9–10.3)
Chloride: 105 mmol/L (ref 98–111)
Creatinine: 0.66 mg/dL (ref 0.44–1.00)
GFR, Estimated: >60 mL/min (ref >60)
Glucose, Bld: 128 mg/dL — ABNORMAL HIGH (ref 70–99)
Potassium: 4.1 mmol/L (ref 3.5–5.1)
Sodium: 140 mmol/L (ref 135–145)
Total Bilirubin: 0.2 mg/dL — ABNORMAL LOW (ref 0.3–1.2)
Total Protein: 6.7 g/dL (ref 6.5–8.1)

## 2020-12-13 LAB — MAGNESIUM: Magnesium: 1.9 mg/dL (ref 1.7–2.4)

## 2020-12-13 MED ORDER — SODIUM CHLORIDE 0.9% FLUSH
10.0000 mL | INTRAVENOUS | Status: DC | PRN
  Administered 2020-12-13: 10 mL
  Filled 2020-12-13: qty 10

## 2020-12-13 MED ORDER — SODIUM CHLORIDE 0.9 % IV SOLN
100.0000 mg/m2 | Freq: Once | INTRAVENOUS | Status: AC
  Administered 2020-12-13: 220 mg via INTRAVENOUS
  Filled 2020-12-13: qty 11

## 2020-12-13 MED ORDER — PALONOSETRON HCL INJECTION 0.25 MG/5ML
INTRAVENOUS | Status: AC
  Filled 2020-12-13: qty 5

## 2020-12-13 MED ORDER — MAGNESIUM SULFATE 2 GM/50ML IV SOLN
INTRAVENOUS | Status: AC
  Filled 2020-12-13: qty 50

## 2020-12-13 MED ORDER — HEPARIN SOD (PORK) LOCK FLUSH 100 UNIT/ML IV SOLN
500.0000 [IU] | Freq: Once | INTRAVENOUS | Status: AC | PRN
  Administered 2020-12-13: 500 [IU]
  Filled 2020-12-13: qty 5

## 2020-12-13 MED ORDER — SODIUM CHLORIDE 0.9 % IV SOLN
150.0000 mg | Freq: Once | INTRAVENOUS | Status: AC
  Administered 2020-12-13: 150 mg via INTRAVENOUS
  Filled 2020-12-13: qty 150

## 2020-12-13 MED ORDER — SODIUM CHLORIDE 0.9 % IV SOLN
10.0000 mg | Freq: Once | INTRAVENOUS | Status: AC
  Administered 2020-12-13: 10 mg via INTRAVENOUS
  Filled 2020-12-13: qty 10

## 2020-12-13 MED ORDER — MAGNESIUM SULFATE 2 GM/50ML IV SOLN
2.0000 g | Freq: Once | INTRAVENOUS | Status: AC
  Administered 2020-12-13: 2 g via INTRAVENOUS

## 2020-12-13 MED ORDER — SODIUM CHLORIDE 0.9 % IV SOLN
Freq: Once | INTRAVENOUS | Status: AC
  Filled 2020-12-13: qty 250

## 2020-12-13 MED ORDER — SODIUM CHLORIDE 0.9 % IV SOLN
80.0000 mg/m2 | Freq: Once | INTRAVENOUS | Status: AC
  Administered 2020-12-13: 174 mg via INTRAVENOUS
  Filled 2020-12-13: qty 174

## 2020-12-13 MED ORDER — POTASSIUM CHLORIDE IN NACL 20-0.9 MEQ/L-% IV SOLN
Freq: Once | INTRAVENOUS | Status: AC
  Filled 2020-12-13: qty 1000

## 2020-12-13 MED ORDER — PALONOSETRON HCL INJECTION 0.25 MG/5ML
0.2500 mg | Freq: Once | INTRAVENOUS | Status: AC
  Administered 2020-12-13: 0.25 mg via INTRAVENOUS

## 2020-12-13 NOTE — Patient Instructions (Signed)
[facility] Discharge Instructions for Patients Receiving Chemotherapy  Today you received the following chemotherapy agents etoposide; Cisplatin  To help prevent nausea and vomiting after your treatment, we encourage you to take your nausea medication as directed   If you develop nausea and vomiting that is not controlled by your nausea medication, call the clinic.   BELOW ARE SYMPTOMS THAT SHOULD BE REPORTED IMMEDIATELY:  *FEVER GREATER THAN 100.5 F  *CHILLS WITH OR WITHOUT FEVER  NAUSEA AND VOMITING THAT IS NOT CONTROLLED WITH YOUR NAUSEA MEDICATION  *UNUSUAL SHORTNESS OF BREATH  *UNUSUAL BRUISING OR BLEEDING  TENDERNESS IN MOUTH AND THROAT WITH OR WITHOUT PRESENCE OF ULCERS  *URINARY PROBLEMS  *BOWEL PROBLEMS  UNUSUAL RASH Items with * indicate a potential emergency and should be followed up as soon as possible.  Feel free to call the clinic should you have any questions or concerns. The clinic phone number is [phone].  Please show the [facility] at check-in to the Emergency Department and triage nurse.

## 2020-12-14 ENCOUNTER — Ambulatory Visit
Admission: RE | Admit: 2020-12-14 | Discharge: 2020-12-14 | Disposition: A | Discharge status: Home / Self Care | Payer: Medicare Other | Source: Ambulatory Visit | Attending: Radiation Oncology | Admitting: Radiation Oncology

## 2020-12-14 ENCOUNTER — Inpatient Hospital Stay: Payer: Medicare Other

## 2020-12-14 ENCOUNTER — Other Ambulatory Visit: Payer: Self-pay

## 2020-12-14 VITALS — BP 137/69 | HR 89 | Temp 98.1°F | Resp 20

## 2020-12-14 DIAGNOSIS — J439 Emphysema, unspecified: Secondary | ICD-10-CM | POA: Diagnosis not present

## 2020-12-14 DIAGNOSIS — I6381 Other cerebral infarction due to occlusion or stenosis of small artery: Secondary | ICD-10-CM | POA: Diagnosis not present

## 2020-12-14 DIAGNOSIS — I700 Atherosclerosis of aorta: Secondary | ICD-10-CM | POA: Diagnosis not present

## 2020-12-14 DIAGNOSIS — Z86010 Personal history of colonic polyps: Secondary | ICD-10-CM | POA: Diagnosis not present

## 2020-12-14 DIAGNOSIS — M5137 Other intervertebral disc degeneration, lumbosacral region: Secondary | ICD-10-CM | POA: Diagnosis not present

## 2020-12-14 DIAGNOSIS — F17210 Nicotine dependence, cigarettes, uncomplicated: Secondary | ICD-10-CM | POA: Diagnosis not present

## 2020-12-14 DIAGNOSIS — E785 Hyperlipidemia, unspecified: Secondary | ICD-10-CM | POA: Diagnosis not present

## 2020-12-14 DIAGNOSIS — C3491 Malignant neoplasm of unspecified part of right bronchus or lung: Secondary | ICD-10-CM

## 2020-12-14 DIAGNOSIS — Z510 Encounter for antineoplastic radiation therapy: Secondary | ICD-10-CM | POA: Diagnosis not present

## 2020-12-14 DIAGNOSIS — Z5111 Encounter for antineoplastic chemotherapy: Secondary | ICD-10-CM | POA: Diagnosis not present

## 2020-12-14 DIAGNOSIS — Z85820 Personal history of malignant melanoma of skin: Secondary | ICD-10-CM | POA: Diagnosis not present

## 2020-12-14 DIAGNOSIS — Z79899 Other long term (current) drug therapy: Secondary | ICD-10-CM | POA: Diagnosis not present

## 2020-12-14 DIAGNOSIS — M129 Arthropathy, unspecified: Secondary | ICD-10-CM | POA: Diagnosis not present

## 2020-12-14 DIAGNOSIS — C3411 Malignant neoplasm of upper lobe, right bronchus or lung: Secondary | ICD-10-CM | POA: Diagnosis not present

## 2020-12-14 DIAGNOSIS — D3502 Benign neoplasm of left adrenal gland: Secondary | ICD-10-CM | POA: Diagnosis not present

## 2020-12-14 MED ORDER — HEPARIN SOD (PORK) LOCK FLUSH 100 UNIT/ML IV SOLN
500.0000 [IU] | Freq: Once | INTRAVENOUS | Status: AC | PRN
  Administered 2020-12-14: 500 [IU]
  Filled 2020-12-14: qty 5

## 2020-12-14 MED ORDER — SODIUM CHLORIDE 0.9 % IV SOLN
100.0000 mg/m2 | Freq: Once | INTRAVENOUS | Status: AC
  Administered 2020-12-14: 220 mg via INTRAVENOUS
  Filled 2020-12-14: qty 11

## 2020-12-14 MED ORDER — SODIUM CHLORIDE 0.9 % IV SOLN
Freq: Once | INTRAVENOUS | Status: AC
  Filled 2020-12-14: qty 250

## 2020-12-14 MED ORDER — DEXAMETHASONE SODIUM PHOSPHATE 100 MG/10ML IJ SOLN
10.0000 mg | Freq: Once | INTRAMUSCULAR | Status: AC
  Administered 2020-12-14: 10 mg via INTRAVENOUS
  Filled 2020-12-14: qty 10

## 2020-12-14 MED ORDER — SODIUM CHLORIDE 0.9% FLUSH
10.0000 mL | INTRAVENOUS | Status: DC | PRN
  Administered 2020-12-14: 10 mL
  Filled 2020-12-14: qty 10

## 2020-12-14 NOTE — Patient Instructions (Signed)
[facility] Discharge Instructions for Patients Receiving Chemotherapy  Today you received the following chemotherapy agents Etoposide  To help prevent nausea and vomiting after your treatment, we encourage you to take your nausea medication as directed   If you develop nausea and vomiting that is not controlled by your nausea medication, call the clinic.   BELOW ARE SYMPTOMS THAT SHOULD BE REPORTED IMMEDIATELY:  *FEVER GREATER THAN 100.5 F  *CHILLS WITH OR WITHOUT FEVER  NAUSEA AND VOMITING THAT IS NOT CONTROLLED WITH YOUR NAUSEA MEDICATION  *UNUSUAL SHORTNESS OF BREATH  *UNUSUAL BRUISING OR BLEEDING  TENDERNESS IN MOUTH AND THROAT WITH OR WITHOUT PRESENCE OF ULCERS  *URINARY PROBLEMS  *BOWEL PROBLEMS  UNUSUAL RASH Items with * indicate a potential emergency and should be followed up as soon as possible.  Feel free to call the clinic should you have any questions or concerns. The clinic phone number is [phone].  Please show the [place] at check-in to the Emergency Department and triage nurse.

## 2020-12-15 ENCOUNTER — Inpatient Hospital Stay: Payer: Medicare Other

## 2020-12-15 ENCOUNTER — Ambulatory Visit
Admission: RE | Admit: 2020-12-15 | Discharge: 2020-12-15 | Disposition: A | Discharge status: Home / Self Care | Payer: Medicare Other | Source: Ambulatory Visit | Attending: Radiation Oncology | Admitting: Radiation Oncology

## 2020-12-15 VITALS — BP 146/76 | HR 95 | Temp 98.0°F | Resp 18

## 2020-12-15 DIAGNOSIS — I700 Atherosclerosis of aorta: Secondary | ICD-10-CM | POA: Diagnosis not present

## 2020-12-15 DIAGNOSIS — I6381 Other cerebral infarction due to occlusion or stenosis of small artery: Secondary | ICD-10-CM | POA: Diagnosis not present

## 2020-12-15 DIAGNOSIS — Z510 Encounter for antineoplastic radiation therapy: Secondary | ICD-10-CM | POA: Diagnosis not present

## 2020-12-15 DIAGNOSIS — Z79899 Other long term (current) drug therapy: Secondary | ICD-10-CM | POA: Diagnosis not present

## 2020-12-15 DIAGNOSIS — Z85820 Personal history of malignant melanoma of skin: Secondary | ICD-10-CM | POA: Diagnosis not present

## 2020-12-15 DIAGNOSIS — C3491 Malignant neoplasm of unspecified part of right bronchus or lung: Secondary | ICD-10-CM

## 2020-12-15 DIAGNOSIS — E785 Hyperlipidemia, unspecified: Secondary | ICD-10-CM | POA: Diagnosis not present

## 2020-12-15 DIAGNOSIS — M5137 Other intervertebral disc degeneration, lumbosacral region: Secondary | ICD-10-CM | POA: Diagnosis not present

## 2020-12-15 DIAGNOSIS — J439 Emphysema, unspecified: Secondary | ICD-10-CM | POA: Diagnosis not present

## 2020-12-15 DIAGNOSIS — Z86010 Personal history of colonic polyps: Secondary | ICD-10-CM | POA: Diagnosis not present

## 2020-12-15 DIAGNOSIS — C3411 Malignant neoplasm of upper lobe, right bronchus or lung: Secondary | ICD-10-CM | POA: Diagnosis not present

## 2020-12-15 DIAGNOSIS — F17210 Nicotine dependence, cigarettes, uncomplicated: Secondary | ICD-10-CM | POA: Diagnosis not present

## 2020-12-15 DIAGNOSIS — Z5111 Encounter for antineoplastic chemotherapy: Secondary | ICD-10-CM | POA: Diagnosis not present

## 2020-12-15 DIAGNOSIS — M129 Arthropathy, unspecified: Secondary | ICD-10-CM | POA: Diagnosis not present

## 2020-12-15 DIAGNOSIS — D3502 Benign neoplasm of left adrenal gland: Secondary | ICD-10-CM | POA: Diagnosis not present

## 2020-12-15 MED ORDER — SODIUM CHLORIDE 0.9 % IV SOLN
Freq: Once | INTRAVENOUS | Status: AC
  Filled 2020-12-15: qty 250

## 2020-12-15 MED ORDER — SODIUM CHLORIDE 0.9 % IV SOLN
10.0000 mg | Freq: Once | INTRAVENOUS | Status: AC
  Administered 2020-12-15: 10 mg via INTRAVENOUS
  Filled 2020-12-15: qty 10

## 2020-12-15 MED ORDER — SODIUM CHLORIDE 0.9% FLUSH
10.0000 mL | INTRAVENOUS | Status: DC | PRN
  Administered 2020-12-15: 10 mL
  Filled 2020-12-15: qty 10

## 2020-12-15 MED ORDER — HEPARIN SOD (PORK) LOCK FLUSH 100 UNIT/ML IV SOLN
500.0000 [IU] | Freq: Once | INTRAVENOUS | Status: AC | PRN
  Administered 2020-12-15: 500 [IU]
  Filled 2020-12-15: qty 5

## 2020-12-15 MED ORDER — SODIUM CHLORIDE 0.9 % IV SOLN
100.0000 mg/m2 | Freq: Once | INTRAVENOUS | Status: AC
  Administered 2020-12-15: 220 mg via INTRAVENOUS
  Filled 2020-12-15: qty 11

## 2020-12-15 NOTE — Patient Instructions (Signed)
[facility] Discharge Instructions for Patients Receiving Chemotherapy  Today you received the following chemotherapy agents Etoposide  To help prevent nausea and vomiting after your treatment, we encourage you to take your nausea medication as directed   If you develop nausea and vomiting that is not controlled by your nausea medication, call the clinic.   BELOW ARE SYMPTOMS THAT SHOULD BE REPORTED IMMEDIATELY:  *FEVER GREATER THAN 100.5 F  *CHILLS WITH OR WITHOUT FEVER  NAUSEA AND VOMITING THAT IS NOT CONTROLLED WITH YOUR NAUSEA MEDICATION  *UNUSUAL SHORTNESS OF BREATH  *UNUSUAL BRUISING OR BLEEDING  TENDERNESS IN MOUTH AND THROAT WITH OR WITHOUT PRESENCE OF ULCERS  *URINARY PROBLEMS  *BOWEL PROBLEMS  UNUSUAL RASH Items with * indicate a potential emergency and should be followed up as soon as possible.  Feel free to call the clinic should you have any questions or concerns. The clinic phone number is [phone].  Please show the [place] at check-in to the Emergency Department and triage nurse.

## 2020-12-16 ENCOUNTER — Other Ambulatory Visit: Payer: Self-pay

## 2020-12-16 ENCOUNTER — Ambulatory Visit
Admission: RE | Admit: 2020-12-16 | Discharge: 2020-12-16 | Disposition: A | Discharge status: Home / Self Care | Payer: Medicare Other | Source: Ambulatory Visit | Attending: Radiation Oncology | Admitting: Radiation Oncology

## 2020-12-16 ENCOUNTER — Inpatient Hospital Stay: Payer: Medicare Other | Admitting: General Practice

## 2020-12-16 DIAGNOSIS — Z510 Encounter for antineoplastic radiation therapy: Secondary | ICD-10-CM | POA: Diagnosis not present

## 2020-12-16 DIAGNOSIS — C3491 Malignant neoplasm of unspecified part of right bronchus or lung: Secondary | ICD-10-CM

## 2020-12-16 DIAGNOSIS — F17210 Nicotine dependence, cigarettes, uncomplicated: Secondary | ICD-10-CM | POA: Diagnosis not present

## 2020-12-16 DIAGNOSIS — C3411 Malignant neoplasm of upper lobe, right bronchus or lung: Secondary | ICD-10-CM | POA: Diagnosis not present

## 2020-12-16 NOTE — Progress Notes (Signed)
[facility] Social Work  Clinical Social Work was referred by [facility]  to review and complete healthcare advance directives.  Clinical Social Worker met with patient in [employer].  The patient designated [name] as their primary healthcare agent and [name] as their secondary agent.  Patient also completed healthcare living will.    Clinical Social Worker notarized documents and made copies for patient/family. Clinical Social Worker will send documents to medical records to be scanned into patient's chart. Clinical Social Worker encouraged patient/family to contact with any additional questions or concerns.  Edwyna Shell, LCSW Clinical Social Worker Phone:  [phone]

## 2020-12-19 ENCOUNTER — Other Ambulatory Visit: Payer: Self-pay

## 2020-12-19 ENCOUNTER — Other Ambulatory Visit: Payer: Medicare Other

## 2020-12-19 ENCOUNTER — Other Ambulatory Visit: Payer: Self-pay | Admitting: *Deleted

## 2020-12-19 ENCOUNTER — Ambulatory Visit (INDEPENDENT_AMBULATORY_CARE_PROVIDER_SITE_OTHER): Payer: Medicare Other | Admitting: Emergency Medicine

## 2020-12-19 ENCOUNTER — Ambulatory Visit
Admission: RE | Admit: 2020-12-19 | Discharge: 2020-12-19 | Disposition: A | Discharge status: Home / Self Care | Payer: Medicare Other | Source: Ambulatory Visit | Attending: Radiation Oncology | Admitting: Radiation Oncology

## 2020-12-19 ENCOUNTER — Encounter: Payer: Self-pay | Admitting: Emergency Medicine

## 2020-12-19 ENCOUNTER — Inpatient Hospital Stay: Payer: Medicare Other

## 2020-12-19 DIAGNOSIS — C3490 Malignant neoplasm of unspecified part of unspecified bronchus or lung: Secondary | ICD-10-CM

## 2020-12-19 DIAGNOSIS — Z510 Encounter for antineoplastic radiation therapy: Secondary | ICD-10-CM | POA: Diagnosis not present

## 2020-12-19 DIAGNOSIS — Z79899 Other long term (current) drug therapy: Secondary | ICD-10-CM | POA: Diagnosis not present

## 2020-12-19 DIAGNOSIS — C3411 Malignant neoplasm of upper lobe, right bronchus or lung: Secondary | ICD-10-CM | POA: Diagnosis not present

## 2020-12-19 DIAGNOSIS — J449 Chronic obstructive pulmonary disease, unspecified: Secondary | ICD-10-CM | POA: Diagnosis not present

## 2020-12-19 DIAGNOSIS — M5137 Other intervertebral disc degeneration, lumbosacral region: Secondary | ICD-10-CM | POA: Diagnosis not present

## 2020-12-19 DIAGNOSIS — I6381 Other cerebral infarction due to occlusion or stenosis of small artery: Secondary | ICD-10-CM | POA: Diagnosis not present

## 2020-12-19 DIAGNOSIS — M129 Arthropathy, unspecified: Secondary | ICD-10-CM | POA: Diagnosis not present

## 2020-12-19 DIAGNOSIS — C3491 Malignant neoplasm of unspecified part of right bronchus or lung: Secondary | ICD-10-CM | POA: Diagnosis not present

## 2020-12-19 DIAGNOSIS — Z85820 Personal history of malignant melanoma of skin: Secondary | ICD-10-CM | POA: Diagnosis not present

## 2020-12-19 DIAGNOSIS — F17210 Nicotine dependence, cigarettes, uncomplicated: Secondary | ICD-10-CM | POA: Diagnosis not present

## 2020-12-19 DIAGNOSIS — Z5111 Encounter for antineoplastic chemotherapy: Secondary | ICD-10-CM | POA: Diagnosis not present

## 2020-12-19 DIAGNOSIS — E785 Hyperlipidemia, unspecified: Secondary | ICD-10-CM | POA: Diagnosis not present

## 2020-12-19 DIAGNOSIS — Z86010 Personal history of colonic polyps: Secondary | ICD-10-CM | POA: Diagnosis not present

## 2020-12-19 DIAGNOSIS — D3502 Benign neoplasm of left adrenal gland: Secondary | ICD-10-CM | POA: Diagnosis not present

## 2020-12-19 DIAGNOSIS — I700 Atherosclerosis of aorta: Secondary | ICD-10-CM | POA: Diagnosis not present

## 2020-12-19 DIAGNOSIS — J439 Emphysema, unspecified: Secondary | ICD-10-CM | POA: Diagnosis not present

## 2020-12-19 LAB — CBC WITH DIFFERENTIAL (CANCER CENTER ONLY)
Abs Immature Granulocytes: 0.07 10*3/uL (ref 0.00–0.07)
Basophils Absolute: 0 10*3/uL (ref 0.0–0.1)
Basophils Relative: 0 %
Eosinophils Absolute: 0 10*3/uL (ref 0.0–0.5)
Eosinophils Relative: 0 %
HCT: 37.1 % (ref 36.0–46.0)
Hemoglobin: 12.9 g/dL (ref 12.0–15.0)
Immature Granulocytes: 1 %
Lymphocytes Relative: 11 %
Lymphs Abs: 0.8 10*3/uL (ref 0.7–4.0)
MCH: 32 pg (ref 26.0–34.0)
MCHC: 34.8 g/dL (ref 30.0–36.0)
MCV: 92.1 fL (ref 80.0–100.0)
Monocytes Absolute: 0.1 10*3/uL (ref 0.1–1.0)
Monocytes Relative: 2 %
Neutro Abs: 6.1 10*3/uL (ref 1.7–7.7)
Neutrophils Relative %: 86 %
Platelet Count: 177 10*3/uL (ref 150–400)
RBC: 4.03 MIL/uL (ref 3.87–5.11)
RDW: 13.9 % (ref 11.5–15.5)
WBC Count: 7.1 10*3/uL (ref 4.0–10.5)
nRBC: 0 % (ref 0.0–0.2)

## 2020-12-19 LAB — CMP (CANCER CENTER ONLY)
ALT: 34 U/L (ref 0–44)
AST: 19 U/L (ref 15–41)
Albumin: 3.7 g/dL (ref 3.5–5.0)
Alkaline Phosphatase: 51 U/L (ref 38–126)
Anion gap: 9 (ref 5–15)
BUN: 13 mg/dL (ref 8–23)
CO2: 27 mmol/L (ref 22–32)
Calcium: 9.2 mg/dL (ref 8.9–10.3)
Chloride: 101 mmol/L (ref 98–111)
Creatinine: 0.74 mg/dL (ref 0.44–1.00)
GFR, Estimated: >60 mL/min (ref >60)
Glucose, Bld: 110 mg/dL — ABNORMAL HIGH (ref 70–99)
Potassium: 3.7 mmol/L (ref 3.5–5.1)
Sodium: 137 mmol/L (ref 135–145)
Total Bilirubin: 0.8 mg/dL (ref 0.3–1.2)
Total Protein: 6.9 g/dL (ref 6.5–8.1)

## 2020-12-19 LAB — MAGNESIUM: Magnesium: 1.9 mg/dL (ref 1.7–2.4)

## 2020-12-19 MED ORDER — ALBUTEROL SULFATE HFA 108 (90 BASE) MCG/ACT IN AERS: 2.0000 | INHALATION_SPRAY | Freq: Four times a day (QID) | RESPIRATORY_TRACT | 6 refills | Status: DC | PRN

## 2020-12-19 NOTE — Progress Notes (Signed)
Subjective:    Patient ID: Susan Cortez, female    DOB: 1961-04-13, 60 y.o.   MRN: [id number]  HPI 60 year old active smoker (40 pack years) with history of allergic rhinitis, osteoarthritis with cervical stenosis, prediabetes, COPD. She is referred today for abnormal CT scan of the chest.  She underwent a lung cancer screening CT on 06/16/2020 which I have reviewed, this shows an irregularly shaped somewhat branching nodular area in the posterior right upper lobe, 10 mm, without any associated airspace disease.  Question mucoid impaction versus nodule.  She reports that she has been feeling well. She does have chronic fatigue, chronic pain. She has poor sleep. She has exertional SOB when carrying groceries. Able to walk without trouble. She tried Spiriva without any benefit. No cough, no wheeze.  No PFT available at this time.   60 year old smoker with allergic rhinitis, OA, prediabetes, carries a diagnosis of COPD.  I saw her in November for a irregularly shaped pulmonary nodule, linear opacity seen on screening CT scan. No new issues reported. Still smoking a pack a day. She has CSY scheduled for 10/24/20  CT chest 10/05/2020 reviewed by me shows some stable emphysematous change, slight increase in size and a branching nodular lesion in the right upper lobe suspicious for endobronchial tumor.  Also progression of mediastinal lymphadenopathy with a 1.8 cm precarinal node, right paratracheal node, some right hilar adenopathy  Pulmonary function testing done today reviewed by me, shows grossly normal airflows without a bronchodilator response, normal lung volumes, decreased diffusion capacity that corrects to the normal range when adjusted for alveolar volume  ROV 12/19/20 --this follow-up visit for patient with tobacco use, COPD and new diagnosis small cell lung cancer that was made by bronchoscopy 10/18/2020.  4R, 10 R, 11 R sampled as well as right upper lobe endobronchial lesion.  Limited stage  disease undergoing chemoradiation.  As above she has grossly normal PFT.  She is still smoking 5-6 cig a day. She has some dyspnea and fatigue after cisplatin. No wheeze or cough. No sputum. No hemoptysis.    Review of Systems As per HPI     Objective:   Physical Exam Vitals:   12/19/20 1332  BP: 112/72  Pulse: (!) 102  Temp: (!) 97.3 F (36.3 C)  TempSrc: Temporal  SpO2: 97%  Weight: 215 lb 9.6 oz (97.8 kg)  Height: 5\' 8"  (1.727 m)   Gen: Pleasant, well-nourished, in no distress,  normal affect  ENT: No lesions,  mouth clear,  oropharynx clear, no postnasal drip  Neck: No JVD, no stridor  Lungs: No use of accessory muscles, no crackles or wheezing on normal respiration, no wheeze on forced expiration  Cardiovascular: RRR, heart sounds normal, no murmur or gallops, no peripheral edema  Musculoskeletal: No deformities, no cyanosis or clubbing  Neuro: alert, awake, non focal  Skin: Warm, no lesions or rash      Assessment & Plan:  Small cell carcinoma of right lung (HCC) Limited stage disease undergoing chemotherapy and radiation therapy currently.  Follow-up imaging and assessment of response pending.  COPD, mild ([place]) No significant dyspnea although she does have occasional intermittent symptoms.  She needs to learn how to use an albuterol cubit available for as needed.  No indication for scheduled bronchodilators at this time.  Most important thing at this point will be smoking cessation.  We talked about this today.  She has cut down significantly.  I would like to work to set a quit date  at her next office visit after she is completed her chemotherapy and radiation therapy  Baltazar Apo, MD, PhD 12/19/2020, 1:48 PM [facility] [phone] or if no answer [phone]

## 2020-12-19 NOTE — Addendum Note (Signed)
Addended by: Gavin Potters R on: 12/19/2020 01:52 PM   Modules accepted: Orders

## 2020-12-19 NOTE — Assessment & Plan Note (Signed)
Limited stage disease undergoing chemotherapy and radiation therapy currently.  Follow-up imaging and assessment of response pending.

## 2020-12-19 NOTE — Patient Instructions (Signed)
We will give you a prescription for an albuterol inhaler.  You can use 2 puffs up to every 4 hours if needed for shortness of breath, chest tightness, wheezing. Congratulations on decreasing your cigarettes.  At your next visit we will talk further about setting a quit date and stopping altogether. Continue to follow with medical oncology and radiation oncology as planned. Follow with Dr Lamonte Sakai in 3 months or sooner if you have any problems.

## 2020-12-19 NOTE — Assessment & Plan Note (Signed)
No significant dyspnea although she does have occasional intermittent symptoms.  She needs to learn how to use an albuterol cubit available for as needed.  No indication for scheduled bronchodilators at this time.  Most important thing at this point will be smoking cessation.  We talked about this today.  She has cut down significantly.  I would like to work to set a quit date at her next office visit after she is completed her chemotherapy and radiation therapy

## 2020-12-20 ENCOUNTER — Telehealth: Payer: Self-pay | Admitting: Internal Medicine

## 2020-12-20 ENCOUNTER — Ambulatory Visit
Admission: RE | Admit: 2020-12-20 | Discharge: 2020-12-20 | Disposition: A | Discharge status: Home / Self Care | Payer: Medicare Other | Source: Ambulatory Visit | Attending: Radiation Oncology | Admitting: Radiation Oncology

## 2020-12-20 DIAGNOSIS — Z510 Encounter for antineoplastic radiation therapy: Secondary | ICD-10-CM | POA: Diagnosis not present

## 2020-12-20 DIAGNOSIS — C3411 Malignant neoplasm of upper lobe, right bronchus or lung: Secondary | ICD-10-CM | POA: Diagnosis not present

## 2020-12-20 DIAGNOSIS — F17210 Nicotine dependence, cigarettes, uncomplicated: Secondary | ICD-10-CM | POA: Diagnosis not present

## 2020-12-20 NOTE — Telephone Encounter (Signed)
R/s 5/10 appt due to Provider PAL. Called and spoke with patient. Confirmed appt

## 2020-12-21 ENCOUNTER — Ambulatory Visit
Admission: RE | Admit: 2020-12-21 | Discharge: 2020-12-21 | Disposition: A | Discharge status: Home / Self Care | Payer: Medicare Other | Source: Ambulatory Visit | Attending: Radiation Oncology | Admitting: Radiation Oncology

## 2020-12-21 ENCOUNTER — Other Ambulatory Visit: Payer: Self-pay

## 2020-12-21 DIAGNOSIS — C3411 Malignant neoplasm of upper lobe, right bronchus or lung: Secondary | ICD-10-CM | POA: Diagnosis not present

## 2020-12-21 DIAGNOSIS — Z510 Encounter for antineoplastic radiation therapy: Secondary | ICD-10-CM | POA: Diagnosis not present

## 2020-12-21 DIAGNOSIS — F17210 Nicotine dependence, cigarettes, uncomplicated: Secondary | ICD-10-CM | POA: Diagnosis not present

## 2020-12-22 ENCOUNTER — Other Ambulatory Visit (HOSPITAL_BASED_OUTPATIENT_CLINIC_OR_DEPARTMENT_OTHER): Payer: Self-pay | Admitting: Family Medicine

## 2020-12-22 ENCOUNTER — Ambulatory Visit
Admission: RE | Admit: 2020-12-22 | Discharge: 2020-12-22 | Disposition: A | Discharge status: Home / Self Care | Payer: Medicare Other | Source: Ambulatory Visit | Attending: Radiation Oncology | Admitting: Radiation Oncology

## 2020-12-22 DIAGNOSIS — Z1231 Encounter for screening mammogram for malignant neoplasm of breast: Secondary | ICD-10-CM

## 2020-12-22 DIAGNOSIS — Z510 Encounter for antineoplastic radiation therapy: Secondary | ICD-10-CM | POA: Diagnosis not present

## 2020-12-22 DIAGNOSIS — C3411 Malignant neoplasm of upper lobe, right bronchus or lung: Secondary | ICD-10-CM | POA: Diagnosis not present

## 2020-12-22 DIAGNOSIS — F17210 Nicotine dependence, cigarettes, uncomplicated: Secondary | ICD-10-CM | POA: Diagnosis not present

## 2020-12-23 ENCOUNTER — Other Ambulatory Visit: Payer: Self-pay

## 2020-12-23 ENCOUNTER — Ambulatory Visit
Admission: RE | Admit: 2020-12-23 | Discharge: 2020-12-23 | Disposition: A | Discharge status: Home / Self Care | Payer: Medicare Other | Source: Ambulatory Visit | Attending: Radiation Oncology | Admitting: Radiation Oncology

## 2020-12-23 DIAGNOSIS — Z510 Encounter for antineoplastic radiation therapy: Secondary | ICD-10-CM | POA: Diagnosis not present

## 2020-12-23 DIAGNOSIS — F17210 Nicotine dependence, cigarettes, uncomplicated: Secondary | ICD-10-CM | POA: Diagnosis not present

## 2020-12-23 DIAGNOSIS — C3411 Malignant neoplasm of upper lobe, right bronchus or lung: Secondary | ICD-10-CM | POA: Diagnosis not present

## 2020-12-26 ENCOUNTER — Inpatient Hospital Stay: Payer: Medicare Other | Attending: Physician Assistant

## 2020-12-26 ENCOUNTER — Other Ambulatory Visit: Payer: Medicare Other

## 2020-12-26 ENCOUNTER — Other Ambulatory Visit: Payer: Self-pay

## 2020-12-26 ENCOUNTER — Ambulatory Visit
Admission: RE | Admit: 2020-12-26 | Discharge: 2020-12-26 | Disposition: A | Discharge status: Home / Self Care | Payer: Medicare Other | Source: Ambulatory Visit | Attending: Radiation Oncology | Admitting: Radiation Oncology

## 2020-12-26 ENCOUNTER — Ambulatory Visit: Payer: Medicare Other | Admitting: Internal Medicine

## 2020-12-26 DIAGNOSIS — Z85828 Personal history of other malignant neoplasm of skin: Secondary | ICD-10-CM | POA: Diagnosis not present

## 2020-12-26 DIAGNOSIS — C3411 Malignant neoplasm of upper lobe, right bronchus or lung: Secondary | ICD-10-CM | POA: Diagnosis not present

## 2020-12-26 DIAGNOSIS — Z510 Encounter for antineoplastic radiation therapy: Secondary | ICD-10-CM | POA: Diagnosis not present

## 2020-12-26 DIAGNOSIS — Z95828 Presence of other vascular implants and grafts: Secondary | ICD-10-CM

## 2020-12-26 DIAGNOSIS — Z5111 Encounter for antineoplastic chemotherapy: Secondary | ICD-10-CM | POA: Diagnosis not present

## 2020-12-26 DIAGNOSIS — C3490 Malignant neoplasm of unspecified part of unspecified bronchus or lung: Secondary | ICD-10-CM

## 2020-12-26 DIAGNOSIS — Z79899 Other long term (current) drug therapy: Secondary | ICD-10-CM | POA: Insufficient documentation

## 2020-12-26 DIAGNOSIS — E785 Hyperlipidemia, unspecified: Secondary | ICD-10-CM | POA: Diagnosis not present

## 2020-12-26 DIAGNOSIS — F17210 Nicotine dependence, cigarettes, uncomplicated: Secondary | ICD-10-CM | POA: Diagnosis not present

## 2020-12-26 LAB — CBC WITH DIFFERENTIAL (CANCER CENTER ONLY)
Abs Immature Granulocytes: 0.02 10*3/uL (ref 0.00–0.07)
Basophils Absolute: 0 10*3/uL (ref 0.0–0.1)
Basophils Relative: 1 %
Eosinophils Absolute: 0 10*3/uL (ref 0.0–0.5)
Eosinophils Relative: 0 %
HCT: 31.7 % — ABNORMAL LOW (ref 36.0–46.0)
Hemoglobin: 11.1 g/dL — ABNORMAL LOW (ref 12.0–15.0)
Immature Granulocytes: 1 %
Lymphocytes Relative: 31 %
Lymphs Abs: 0.6 10*3/uL — ABNORMAL LOW (ref 0.7–4.0)
MCH: 32.5 pg (ref 26.0–34.0)
MCHC: 35 g/dL (ref 30.0–36.0)
MCV: 92.7 fL (ref 80.0–100.0)
Monocytes Absolute: 0.2 10*3/uL (ref 0.1–1.0)
Monocytes Relative: 8 %
Neutro Abs: 1.2 10*3/uL — ABNORMAL LOW (ref 1.7–7.7)
Neutrophils Relative %: 59 %
Platelet Count: 111 10*3/uL — ABNORMAL LOW (ref 150–400)
RBC: 3.42 MIL/uL — ABNORMAL LOW (ref 3.87–5.11)
RDW: 13.2 % (ref 11.5–15.5)
WBC Count: 2.1 10*3/uL — ABNORMAL LOW (ref 4.0–10.5)
nRBC: 0 % (ref 0.0–0.2)

## 2020-12-26 LAB — CMP (CANCER CENTER ONLY)
ALT: 21 U/L (ref 0–44)
AST: 17 U/L (ref 15–41)
Albumin: 4.1 g/dL (ref 3.5–5.0)
Alkaline Phosphatase: 52 U/L (ref 38–126)
Anion gap: 8 (ref 5–15)
BUN: 9 mg/dL (ref 8–23)
CO2: 26 mmol/L (ref 22–32)
Calcium: 9.4 mg/dL (ref 8.9–10.3)
Chloride: 104 mmol/L (ref 98–111)
Creatinine: 0.66 mg/dL (ref 0.44–1.00)
GFR, Estimated: >60 mL/min (ref >60)
Glucose, Bld: 95 mg/dL (ref 70–99)
Potassium: 4.2 mmol/L (ref 3.5–5.1)
Sodium: 138 mmol/L (ref 135–145)
Total Bilirubin: 0.3 mg/dL (ref 0.3–1.2)
Total Protein: 7 g/dL (ref 6.5–8.1)

## 2020-12-26 MED ORDER — HEPARIN SOD (PORK) LOCK FLUSH 100 UNIT/ML IV SOLN
500.0000 [IU] | Freq: Once | INTRAVENOUS | Status: AC
  Administered 2020-12-26: 500 [IU] via INTRAVENOUS
  Filled 2020-12-26: qty 5

## 2020-12-26 MED ORDER — SODIUM CHLORIDE 0.9% FLUSH
10.0000 mL | Freq: Once | INTRAVENOUS | Status: AC
  Administered 2020-12-26: 10 mL via INTRAVENOUS
  Filled 2020-12-26: qty 10

## 2020-12-27 ENCOUNTER — Ambulatory Visit
Admission: RE | Admit: 2020-12-27 | Discharge: 2020-12-27 | Disposition: A | Discharge status: Home / Self Care | Payer: Medicare Other | Source: Ambulatory Visit | Attending: Radiation Oncology | Admitting: Radiation Oncology

## 2020-12-27 ENCOUNTER — Ambulatory Visit: Payer: Medicare Other

## 2020-12-27 DIAGNOSIS — Z510 Encounter for antineoplastic radiation therapy: Secondary | ICD-10-CM | POA: Diagnosis not present

## 2020-12-27 DIAGNOSIS — C3411 Malignant neoplasm of upper lobe, right bronchus or lung: Secondary | ICD-10-CM | POA: Diagnosis not present

## 2020-12-27 DIAGNOSIS — F17210 Nicotine dependence, cigarettes, uncomplicated: Secondary | ICD-10-CM | POA: Diagnosis not present

## 2020-12-28 ENCOUNTER — Other Ambulatory Visit: Payer: Self-pay

## 2020-12-28 ENCOUNTER — Ambulatory Visit
Admission: RE | Admit: 2020-12-28 | Discharge: 2020-12-28 | Disposition: A | Discharge status: Home / Self Care | Payer: Medicare Other | Source: Ambulatory Visit | Attending: Radiation Oncology | Admitting: Radiation Oncology

## 2020-12-28 ENCOUNTER — Ambulatory Visit: Payer: Medicare Other

## 2020-12-28 DIAGNOSIS — F17210 Nicotine dependence, cigarettes, uncomplicated: Secondary | ICD-10-CM | POA: Diagnosis not present

## 2020-12-28 DIAGNOSIS — C3411 Malignant neoplasm of upper lobe, right bronchus or lung: Secondary | ICD-10-CM | POA: Diagnosis not present

## 2020-12-28 DIAGNOSIS — Z510 Encounter for antineoplastic radiation therapy: Secondary | ICD-10-CM | POA: Diagnosis not present

## 2020-12-29 ENCOUNTER — Ambulatory Visit: Payer: Medicare Other

## 2020-12-29 ENCOUNTER — Ambulatory Visit
Admission: RE | Admit: 2020-12-29 | Discharge: 2020-12-29 | Disposition: A | Discharge status: Home / Self Care | Payer: Medicare Other | Source: Ambulatory Visit | Attending: Radiation Oncology | Admitting: Radiation Oncology

## 2020-12-29 DIAGNOSIS — C3411 Malignant neoplasm of upper lobe, right bronchus or lung: Secondary | ICD-10-CM | POA: Diagnosis not present

## 2020-12-29 DIAGNOSIS — Z510 Encounter for antineoplastic radiation therapy: Secondary | ICD-10-CM | POA: Diagnosis not present

## 2020-12-29 DIAGNOSIS — F17210 Nicotine dependence, cigarettes, uncomplicated: Secondary | ICD-10-CM | POA: Diagnosis not present

## 2020-12-30 ENCOUNTER — Other Ambulatory Visit: Payer: Self-pay

## 2020-12-30 ENCOUNTER — Ambulatory Visit
Admission: RE | Admit: 2020-12-30 | Discharge: 2020-12-30 | Disposition: A | Discharge status: Home / Self Care | Payer: Medicare Other | Source: Ambulatory Visit | Attending: Radiation Oncology | Admitting: Radiation Oncology

## 2020-12-30 DIAGNOSIS — F17210 Nicotine dependence, cigarettes, uncomplicated: Secondary | ICD-10-CM | POA: Diagnosis not present

## 2020-12-30 DIAGNOSIS — Z510 Encounter for antineoplastic radiation therapy: Secondary | ICD-10-CM | POA: Diagnosis not present

## 2020-12-30 DIAGNOSIS — C3411 Malignant neoplasm of upper lobe, right bronchus or lung: Secondary | ICD-10-CM | POA: Diagnosis not present

## 2021-01-02 ENCOUNTER — Encounter: Payer: Self-pay | Admitting: Internal Medicine

## 2021-01-02 ENCOUNTER — Inpatient Hospital Stay: Payer: Medicare Other

## 2021-01-02 ENCOUNTER — Other Ambulatory Visit: Payer: Medicare Other

## 2021-01-02 ENCOUNTER — Ambulatory Visit
Admission: RE | Admit: 2021-01-02 | Discharge: 2021-01-02 | Disposition: A | Discharge status: Home / Self Care | Payer: Medicare Other | Source: Ambulatory Visit | Attending: Radiation Oncology | Admitting: Radiation Oncology

## 2021-01-02 ENCOUNTER — Inpatient Hospital Stay (HOSPITAL_BASED_OUTPATIENT_CLINIC_OR_DEPARTMENT_OTHER): Payer: Medicare Other | Admitting: Internal Medicine

## 2021-01-02 ENCOUNTER — Other Ambulatory Visit: Payer: Self-pay

## 2021-01-02 VITALS — BP 145/81 | HR 103 | Temp 98.1°F | Resp 18 | Wt 217.6 lb

## 2021-01-02 DIAGNOSIS — Z510 Encounter for antineoplastic radiation therapy: Secondary | ICD-10-CM | POA: Diagnosis not present

## 2021-01-02 DIAGNOSIS — C3490 Malignant neoplasm of unspecified part of unspecified bronchus or lung: Secondary | ICD-10-CM

## 2021-01-02 DIAGNOSIS — Z5111 Encounter for antineoplastic chemotherapy: Secondary | ICD-10-CM | POA: Diagnosis not present

## 2021-01-02 DIAGNOSIS — Z79899 Other long term (current) drug therapy: Secondary | ICD-10-CM | POA: Diagnosis not present

## 2021-01-02 DIAGNOSIS — Z95828 Presence of other vascular implants and grafts: Secondary | ICD-10-CM | POA: Insufficient documentation

## 2021-01-02 DIAGNOSIS — C3491 Malignant neoplasm of unspecified part of right bronchus or lung: Secondary | ICD-10-CM

## 2021-01-02 DIAGNOSIS — F17210 Nicotine dependence, cigarettes, uncomplicated: Secondary | ICD-10-CM | POA: Diagnosis not present

## 2021-01-02 DIAGNOSIS — C3411 Malignant neoplasm of upper lobe, right bronchus or lung: Secondary | ICD-10-CM | POA: Diagnosis not present

## 2021-01-02 DIAGNOSIS — D701 Agranulocytosis secondary to cancer chemotherapy: Secondary | ICD-10-CM

## 2021-01-02 DIAGNOSIS — T451X5A Adverse effect of antineoplastic and immunosuppressive drugs, initial encounter: Secondary | ICD-10-CM | POA: Diagnosis not present

## 2021-01-02 DIAGNOSIS — E785 Hyperlipidemia, unspecified: Secondary | ICD-10-CM | POA: Diagnosis not present

## 2021-01-02 DIAGNOSIS — Z85828 Personal history of other malignant neoplasm of skin: Secondary | ICD-10-CM | POA: Diagnosis not present

## 2021-01-02 LAB — CBC WITH DIFFERENTIAL (CANCER CENTER ONLY)
Abs Immature Granulocytes: 0.07 10*3/uL (ref 0.00–0.07)
Basophils Absolute: 0 10*3/uL (ref 0.0–0.1)
Basophils Relative: 1 %
Eosinophils Absolute: 0 10*3/uL (ref 0.0–0.5)
Eosinophils Relative: 1 %
HCT: 32.3 % — ABNORMAL LOW (ref 36.0–46.0)
Hemoglobin: 10.8 g/dL — ABNORMAL LOW (ref 12.0–15.0)
Immature Granulocytes: 4 %
Lymphocytes Relative: 29 %
Lymphs Abs: 0.5 10*3/uL — ABNORMAL LOW (ref 0.7–4.0)
MCH: 31.8 pg (ref 26.0–34.0)
MCHC: 33.4 g/dL (ref 30.0–36.0)
MCV: 95 fL (ref 80.0–100.0)
Monocytes Absolute: 0.4 10*3/uL (ref 0.1–1.0)
Monocytes Relative: 21 %
Neutro Abs: 0.9 10*3/uL — ABNORMAL LOW (ref 1.7–7.7)
Neutrophils Relative %: 44 %
Platelet Count: 338 10*3/uL (ref 150–400)
RBC: 3.4 MIL/uL — ABNORMAL LOW (ref 3.87–5.11)
RDW: 14.6 % (ref 11.5–15.5)
WBC Count: 1.9 10*3/uL — ABNORMAL LOW (ref 4.0–10.5)
nRBC: 0 % (ref 0.0–0.2)

## 2021-01-02 LAB — CMP (CANCER CENTER ONLY)
ALT: 17 U/L (ref 0–44)
AST: 16 U/L (ref 15–41)
Albumin: 3.8 g/dL (ref 3.5–5.0)
Alkaline Phosphatase: 46 U/L (ref 38–126)
Anion gap: 9 (ref 5–15)
BUN: 7 mg/dL — ABNORMAL LOW (ref 8–23)
CO2: 27 mmol/L (ref 22–32)
Calcium: 9.1 mg/dL (ref 8.9–10.3)
Chloride: 103 mmol/L (ref 98–111)
Creatinine: 0.68 mg/dL (ref 0.44–1.00)
GFR, Estimated: >60 mL/min (ref >60)
Glucose, Bld: 141 mg/dL — ABNORMAL HIGH (ref 70–99)
Potassium: 3.8 mmol/L (ref 3.5–5.1)
Sodium: 139 mmol/L (ref 135–145)
Total Bilirubin: <0.2 mg/dL — ABNORMAL LOW (ref 0.3–1.2)
Total Protein: 6.4 g/dL — ABNORMAL LOW (ref 6.5–8.1)

## 2021-01-02 MED ORDER — SODIUM CHLORIDE 0.9% FLUSH
10.0000 mL | Freq: Once | INTRAVENOUS | Status: AC
  Administered 2021-01-02: 10 mL
  Filled 2021-01-02: qty 10

## 2021-01-02 MED ORDER — HEPARIN SOD (PORK) LOCK FLUSH 100 UNIT/ML IV SOLN
500.0000 [IU] | Freq: Once | INTRAVENOUS | Status: AC
  Administered 2021-01-02: 500 [IU]
  Filled 2021-01-02: qty 5

## 2021-01-02 MED FILL — Dexamethasone Sodium Phosphate Inj 100 MG/10ML: INTRAMUSCULAR | Qty: 1 | Status: AC

## 2021-01-02 MED FILL — Fosaprepitant Dimeglumine For IV Infusion 150 MG (Base Eq): INTRAVENOUS | Qty: 5 | Status: AC

## 2021-01-02 NOTE — Progress Notes (Signed)
[facility] [phone]   [phone]  OFFICE PROGRESS NOTE  Copland, Gay Filler, MD [address] [place] [address]   DIAGNOSIS: Limitedstage (T1c, N2, M0) Small Cell Lung Cancer. She presented with aright upper lobe suprahilar nodule/adenopathy, right paratracheal lymph node, and a hypermetabolic right upper lobe nodule.She was diagnosed in February2022.   PRIOR THERAPY: None  CURRENT THERAPY: Systemic chemotherapy with cisplatin 80 mg per metered squared on days 1, etoposide 100 mg/m2 on days 1, 2, and 3 IV every 3 weeks with concurrent radiation. First dose expected on 11/15/2020. Status post 2 cycles.  INTERVAL HISTORY: Susan Cortez 60 y.o. female returns to the clinic today for follow-up visit accompanied by her daughter.  The patient is feeling fine today with no concerning complaints except for mild fatigue.  She tolerated the second cycle of her chemotherapy fairly well.  She denied having any chest pain, shortness of breath, cough or hemoptysis.  She denied having any fever or chills.  She has no nausea, vomiting, diarrhea or constipation.  She is here today for evaluation before starting cycle #3.    MEDICAL HISTORY: Past Medical History:  Diagnosis Date  . Allergy   . Arthritis   . Basal cell carcinoma 2018   Removed from Right neck per patient   . Diverticulosis 2013  . HLD (hyperlipidemia)   . Hx of adenomatous colonic polyps 2013  . OAB (overactive bladder)   . Pre-diabetes   . Small cell lung cancer ([place]) 11/09/2020    ALLERGIES:  is allergic to lyrica [pregabalin], butrans [buprenorphine], chantix [varenicline], diclofenac, meloxicam, tolmetin, and nsaids.  MEDICATIONS:  Current Outpatient Medications  Medication Sig Dispense Refill  . acetaminophen-codeine (TYLENOL #4) 300-60 MG tablet Take 1 tablet by mouth daily. 30 tablet 2  . albuterol (VENTOLIN HFA) 108 (90 Base) MCG/ACT inhaler Inhale 2  puffs into the lungs every 6 (six) hours as needed for wheezing or shortness of breath. 8 g 6  . CALCIUM PO Take 650 mg by mouth daily.    . Cholecalciferol (VITAMIN D3) 125 MCG (5000 UT) CAPS Take 5,000 Units by mouth daily.    Marland Kitchen lidocaine-prilocaine (EMLA) cream Apply 1 application topically as needed. 30 g 0  . LORazepam (ATIVAN) 0.5 MG tablet Take 1 tablet (0.5 mg total) by mouth every 8 (eight) hours. 4 tablet 0  . lovastatin (MEVACOR) 20 MG tablet TAKE 1 TABLET(20 MG) BY MOUTH AT BEDTIME (Patient taking differently: Take 20 mg by mouth at bedtime.) 90 tablet 3  . mirabegron ER (MYRBETRIQ) 50 MG TB24 tablet Take 1 tablet (50 mg total) by mouth daily. 90 tablet 1  . prochlorperazine (COMPAZINE) 10 MG tablet Take 1 tablet (10 mg total) by mouth every 6 (six) hours as needed. 30 tablet 2   No current facility-administered medications for this visit.    SURGICAL HISTORY:  Past Surgical History:  Procedure Laterality Date  . CESAREAN SECTION     X 3  . COLONOSCOPY  2013, 2016, 2019   hx colon polyps  . DILATION AND CURETTAGE OF UTERUS    . FUDUCIAL PLACEMENT Right 10/18/2020   Procedure: PLACEMENT OF FUDUCIAL;  Surgeon: Collene Gobble, MD;  Location: [facility];  Service: Thoracic;  Laterality: Right;  . IR IMAGING GUIDED PORT INSERTION  11/29/2020  . SHOULDER ARTHROSCOPY WITH OPEN ROTATOR CUFF REPAIR Right 05/2009  . TONSILLECTOMY  1977  . TUBAL LIGATION    .  VIDEO BRONCHOSCOPY WITH ENDOBRONCHIAL NAVIGATION N/A 10/18/2020   Procedure: VIDEO BRONCHOSCOPY WITH ENDOBRONCHIAL NAVIGATION;  Surgeon: Collene Gobble, MD;  Location: [facility];  Service: Thoracic;  Laterality: N/A;  . VIDEO BRONCHOSCOPY WITH ENDOBRONCHIAL ULTRASOUND N/A 10/18/2020   Procedure: VIDEO BRONCHOSCOPY WITH ENDOBRONCHIAL ULTRASOUND;  Surgeon: Collene Gobble, MD;  Location: [facility] OR;  Service: Thoracic;  Laterality: N/A;  . WISDOM TOOTH EXTRACTION      REVIEW OF SYSTEMS:  A comprehensive review of systems was negative except for:  Constitutional: positive for fatigue   PHYSICAL EXAMINATION: General appearance: alert, cooperative, fatigued and no distress Head: Normocephalic, without obvious abnormality, atraumatic Neck: no adenopathy, no JVD, supple, symmetrical, trachea midline and thyroid not enlarged, symmetric, no tenderness/mass/nodules Lymph nodes: Cervical, supraclavicular, and axillary nodes normal. Resp: clear to auscultation bilaterally Back: symmetric, no curvature. ROM normal. No CVA tenderness. Cardio: regular rate and rhythm, S1, S2 normal, no murmur, click, rub or gallop GI: soft, non-tender; bowel sounds normal; no masses,  no organomegaly Extremities: extremities normal, atraumatic, no cyanosis or edema  ECOG PERFORMANCE STATUS: 1 - Symptomatic but completely ambulatory  Blood pressure (!) 145/81, pulse (!) 103, temperature 98.1 F (36.7 C), temperature source Tympanic, resp. rate 18, weight 217 lb 9.6 oz (98.7 kg), SpO2 99 %.  LABORATORY DATA: Lab Results  Component Value Date   WBC 2.1 (L) 12/26/2020   HGB 11.1 (L) 12/26/2020   HCT 31.7 (L) 12/26/2020   MCV 92.7 12/26/2020   PLT 111 (L) 12/26/2020      Chemistry      Component Value Date/Time   NA 138 12/26/2020 1108   K 4.2 12/26/2020 1108   CL 104 12/26/2020 1108   CO2 26 12/26/2020 1108   BUN 9 12/26/2020 1108   CREATININE 0.66 12/26/2020 1108   CREATININE 0.65 06/08/2020 1050      Component Value Date/Time   CALCIUM 9.4 12/26/2020 1108   ALKPHOS 52 12/26/2020 1108   AST 17 12/26/2020 1108   ALT 21 12/26/2020 1108   BILITOT 0.3 12/26/2020 1108       RADIOGRAPHIC STUDIES: No results found.  ASSESSMENT AND PLAN: This is a very pleasant 60 years old white female with limited stage (T1c, N2, M0) small cell lung cancer presented with right upper lobe and suprahilar mass in addition to mediastinal lymphadenopathy diagnosed in February 2022.  The patient is currently undergoing systemic chemotherapy with cisplatin 80 mg/M2 on  day 1 and etoposide 100 mg/M2 on days 1, 2 and 3 every 3 weeks status post 2 cycles. The patient has been tolerating this treatment well except for chemotherapy-induced neutropenia. Unfortunately her absolute neutrophil count is not enough for [place] to proceed with cycle #3 today. I will delay the start of cycle #3 by one until improvement of her absolute neutrophil count and in the meantime I will start the patient on Neupogen 480 mg or bio similar for the next 3 days to improve her count before resuming her treatment next week. The patient will continue her concurrent radiotherapy as planned. I will see her back for follow-up visit in 4 weeks for evaluation before starting cycle #4. She was advised to call immediately if she has any other concerning symptoms in the interval. The patient voices understanding of current disease status and treatment options and is in agreement with the current care plan.  All questions were answered. The patient knows to call the clinic with any problems, questions or concerns. We can certainly see the patient much  sooner if necessary.  Disclaimer: This note was dictated with voice recognition software. Similar sounding words can inadvertently be transcribed and may not be corrected upon review.

## 2021-01-02 NOTE — Patient Instructions (Signed)
Steps to Quit Smoking Smoking tobacco is the leading cause of preventable death. It can affect almost every organ in the body. Smoking puts you and people around you at risk for many serious, long-lasting (chronic) diseases. Quitting smoking can be hard, but it is one of the best things that you can do for your health. It is never too late to quit. How do I get ready to quit? When you decide to quit smoking, make a plan to help you succeed. Before you quit:  Pick a date to quit. Set a date within the next 2 weeks to give you time to prepare.  Write down the reasons why you are quitting. Keep this list in places where you will see it often.  Tell your family, friends, and co-workers that you are quitting. Their support is important.  Talk with your doctor about the choices that may help you quit.  Find out if your health insurance will pay for these treatments.  Know the people, places, things, and activities that make you want to smoke (triggers). Avoid them. What first steps can I take to quit smoking?  Throw away all cigarettes at home, at work, and in your car.  Throw away the things that you use when you smoke, such as ashtrays and lighters.  Clean your car. Make sure to empty the ashtray.  Clean your home, including curtains and carpets. What can I do to help me quit smoking? Talk with your doctor about taking medicines and seeing a counselor at the same time. You are more likely to succeed when you do both.  If you are pregnant or breastfeeding, talk with your doctor about counseling or other ways to quit smoking. Do not take medicine to help you quit smoking unless your doctor tells you to do so. To quit smoking: Quit right away  Quit smoking totally, instead of slowly cutting back on how much you smoke over a period of time.  Go to counseling. You are more likely to quit if you go to counseling sessions regularly. Take medicine You may take medicines to help you quit. Some  medicines need a prescription, and some you can buy over-the-counter. Some medicines may contain a drug called nicotine to replace the nicotine in cigarettes. Medicines may:  Help you to stop having the desire to smoke (cravings).  Help to stop the problems that come when you stop smoking (withdrawal symptoms). Your doctor may ask you to use:  Nicotine patches, gum, or lozenges.  Nicotine inhalers or sprays.  Non-nicotine medicine that is taken by mouth. Find resources Find resources and other ways to help you quit smoking and remain smoke-free after you quit. These resources are most helpful when you use them often. They include:  Online chats with a counselor.  Phone quitlines.  Printed self-help materials.  Support groups or group counseling.  Text messaging programs.  Mobile phone apps. Use apps on your mobile phone or tablet that can help you stick to your quit plan. There are many free apps for mobile phones and tablets as well as websites. Examples include Quit Guide from the CDC and smokefree.gov   What things can I do to make it easier to quit?  Talk to your family and friends. Ask them to support and encourage you.  Call a phone quitline (1-800-QUIT-NOW), reach out to support groups, or work with a counselor.  Ask people who smoke to not smoke around you.  Avoid places that make you want to smoke,   such as: ? Bars. ? Parties. ? Smoke-break areas at work.  Spend time with people who do not smoke.  Lower the stress in your life. Stress can make you want to smoke. Try these things to help your stress: ? Getting regular exercise. ? Doing deep-breathing exercises. ? Doing yoga. ? Meditating. ? Doing a body scan. To do this, close your eyes, focus on one area of your body at a time from head to toe. Notice which parts of your body are tense. Try to relax the muscles in those areas.   How will I feel when I quit smoking? Day 1 to 3 weeks Within the first 24 hours,  you may start to have some problems that come from quitting tobacco. These problems are very bad 2-3 days after you quit, but they do not often last for more than 2-3 weeks. You may get these symptoms:  Mood swings.  Feeling restless, nervous, angry, or annoyed.  Trouble concentrating.  Dizziness.  Strong desire for high-sugar foods and nicotine.  Weight gain.  Trouble pooping (constipation).  Feeling like you may vomit (nausea).  Coughing or a sore throat.  Changes in how the medicines that you take for other issues work in your body.  Depression.  Trouble sleeping (insomnia). Week 3 and afterward After the first 2-3 weeks of quitting, you may start to notice more positive results, such as:  Better sense of smell and taste.  Less coughing and sore throat.  Slower heart rate.  Lower blood pressure.  Clearer skin.  Better breathing.  Fewer sick days. Quitting smoking can be hard. Do not give up if you fail the first time. Some people need to try a few times before they succeed. Do your best to stick to your quit plan, and talk with your doctor if you have any questions or concerns. Summary  Smoking tobacco is the leading cause of preventable death. Quitting smoking can be hard, but it is one of the best things that you can do for your health.  When you decide to quit smoking, make a plan to help you succeed.  Quit smoking right away, not slowly over a period of time.  When you start quitting, seek help from your doctor, family, or friends. This information is not intended to replace advice given to you by your health care provider. Make sure you discuss any questions you have with your health care provider. Document Revised: 05/08/2019 Document Reviewed: 11/01/2018 Elsevier Patient Education  2021 Elsevier Inc.  

## 2021-01-03 ENCOUNTER — Ambulatory Visit
Admission: RE | Admit: 2021-01-03 | Discharge: 2021-01-03 | Disposition: A | Discharge status: Home / Self Care | Payer: Medicare Other | Source: Ambulatory Visit | Attending: Radiation Oncology | Admitting: Radiation Oncology

## 2021-01-03 ENCOUNTER — Inpatient Hospital Stay: Payer: Medicare Other

## 2021-01-03 ENCOUNTER — Telehealth: Payer: Self-pay | Admitting: Internal Medicine

## 2021-01-03 ENCOUNTER — Ambulatory Visit: Payer: Medicare Other | Admitting: Internal Medicine

## 2021-01-03 ENCOUNTER — Other Ambulatory Visit: Payer: Self-pay

## 2021-01-03 VITALS — BP 135/83 | HR 103 | Temp 98.7°F | Resp 18

## 2021-01-03 DIAGNOSIS — Z95828 Presence of other vascular implants and grafts: Secondary | ICD-10-CM

## 2021-01-03 DIAGNOSIS — Z79899 Other long term (current) drug therapy: Secondary | ICD-10-CM | POA: Diagnosis not present

## 2021-01-03 DIAGNOSIS — C3491 Malignant neoplasm of unspecified part of right bronchus or lung: Secondary | ICD-10-CM

## 2021-01-03 DIAGNOSIS — E785 Hyperlipidemia, unspecified: Secondary | ICD-10-CM | POA: Diagnosis not present

## 2021-01-03 DIAGNOSIS — F17210 Nicotine dependence, cigarettes, uncomplicated: Secondary | ICD-10-CM | POA: Diagnosis not present

## 2021-01-03 DIAGNOSIS — Z5111 Encounter for antineoplastic chemotherapy: Secondary | ICD-10-CM | POA: Diagnosis not present

## 2021-01-03 DIAGNOSIS — Z85828 Personal history of other malignant neoplasm of skin: Secondary | ICD-10-CM | POA: Diagnosis not present

## 2021-01-03 DIAGNOSIS — Z510 Encounter for antineoplastic radiation therapy: Secondary | ICD-10-CM | POA: Diagnosis not present

## 2021-01-03 DIAGNOSIS — C3411 Malignant neoplasm of upper lobe, right bronchus or lung: Secondary | ICD-10-CM | POA: Diagnosis not present

## 2021-01-03 MED ORDER — FILGRASTIM-SNDZ 480 MCG/0.8ML IJ SOSY
480.0000 ug | PREFILLED_SYRINGE | Freq: Once | INTRAMUSCULAR | Status: AC
  Administered 2021-01-03: 480 ug via SUBCUTANEOUS

## 2021-01-03 MED ORDER — FILGRASTIM-SNDZ 480 MCG/0.8ML IJ SOSY
PREFILLED_SYRINGE | INTRAMUSCULAR | Status: AC
  Filled 2021-01-03: qty 0.8

## 2021-01-03 NOTE — Patient Instructions (Signed)

## 2021-01-03 NOTE — Telephone Encounter (Signed)
Scheduled appts per 5/10 sch msg. Pt aware.

## 2021-01-04 ENCOUNTER — Encounter: Payer: Self-pay | Admitting: Radiation Oncology

## 2021-01-04 ENCOUNTER — Ambulatory Visit
Admission: RE | Admit: 2021-01-04 | Discharge: 2021-01-04 | Disposition: A | Discharge status: Home / Self Care | Payer: Medicare Other | Source: Ambulatory Visit | Attending: Radiation Oncology | Admitting: Radiation Oncology

## 2021-01-04 ENCOUNTER — Inpatient Hospital Stay: Payer: Medicare Other

## 2021-01-04 VITALS — BP 123/75 | HR 84

## 2021-01-04 DIAGNOSIS — C3491 Malignant neoplasm of unspecified part of right bronchus or lung: Secondary | ICD-10-CM

## 2021-01-04 DIAGNOSIS — F17210 Nicotine dependence, cigarettes, uncomplicated: Secondary | ICD-10-CM | POA: Diagnosis not present

## 2021-01-04 DIAGNOSIS — C3411 Malignant neoplasm of upper lobe, right bronchus or lung: Secondary | ICD-10-CM | POA: Diagnosis not present

## 2021-01-04 DIAGNOSIS — E785 Hyperlipidemia, unspecified: Secondary | ICD-10-CM | POA: Diagnosis not present

## 2021-01-04 DIAGNOSIS — Z79899 Other long term (current) drug therapy: Secondary | ICD-10-CM | POA: Diagnosis not present

## 2021-01-04 DIAGNOSIS — Z95828 Presence of other vascular implants and grafts: Secondary | ICD-10-CM

## 2021-01-04 DIAGNOSIS — Z5111 Encounter for antineoplastic chemotherapy: Secondary | ICD-10-CM | POA: Diagnosis not present

## 2021-01-04 DIAGNOSIS — Z510 Encounter for antineoplastic radiation therapy: Secondary | ICD-10-CM | POA: Diagnosis not present

## 2021-01-04 DIAGNOSIS — Z85828 Personal history of other malignant neoplasm of skin: Secondary | ICD-10-CM | POA: Diagnosis not present

## 2021-01-04 MED ORDER — FILGRASTIM-SNDZ 480 MCG/0.8ML IJ SOSY
480.0000 ug | PREFILLED_SYRINGE | Freq: Once | INTRAMUSCULAR | Status: AC
  Administered 2021-01-04: 480 ug via SUBCUTANEOUS

## 2021-01-04 MED ORDER — FILGRASTIM-SNDZ 480 MCG/0.8ML IJ SOSY
PREFILLED_SYRINGE | INTRAMUSCULAR | Status: AC
  Filled 2021-01-04: qty 0.8

## 2021-01-05 ENCOUNTER — Other Ambulatory Visit: Payer: Self-pay

## 2021-01-05 ENCOUNTER — Inpatient Hospital Stay: Payer: Medicare Other

## 2021-01-05 VITALS — BP 107/72 | HR 78 | Temp 98.2°F | Resp 18

## 2021-01-05 DIAGNOSIS — E785 Hyperlipidemia, unspecified: Secondary | ICD-10-CM | POA: Diagnosis not present

## 2021-01-05 DIAGNOSIS — C3491 Malignant neoplasm of unspecified part of right bronchus or lung: Secondary | ICD-10-CM

## 2021-01-05 DIAGNOSIS — Z5111 Encounter for antineoplastic chemotherapy: Secondary | ICD-10-CM | POA: Diagnosis not present

## 2021-01-05 DIAGNOSIS — C3411 Malignant neoplasm of upper lobe, right bronchus or lung: Secondary | ICD-10-CM | POA: Diagnosis not present

## 2021-01-05 DIAGNOSIS — Z85828 Personal history of other malignant neoplasm of skin: Secondary | ICD-10-CM | POA: Diagnosis not present

## 2021-01-05 DIAGNOSIS — Z95828 Presence of other vascular implants and grafts: Secondary | ICD-10-CM

## 2021-01-05 DIAGNOSIS — Z79899 Other long term (current) drug therapy: Secondary | ICD-10-CM | POA: Diagnosis not present

## 2021-01-05 MED ORDER — FILGRASTIM-SNDZ 480 MCG/0.8ML IJ SOSY
PREFILLED_SYRINGE | INTRAMUSCULAR | Status: AC
  Filled 2021-01-05: qty 0.8

## 2021-01-05 MED ORDER — FILGRASTIM-SNDZ 480 MCG/0.8ML IJ SOSY
480.0000 ug | PREFILLED_SYRINGE | Freq: Once | INTRAMUSCULAR | Status: AC
  Administered 2021-01-05: 480 ug via SUBCUTANEOUS

## 2021-01-05 NOTE — Patient Instructions (Signed)
Tbo-Filgrastim injection What is this medicine? TBO-FILGRASTIM (T B O fil GRA stim) is a granulocyte colony-stimulating factor that helps you make more neutrophils, a type of white blood cell. Neutrophils are important for fighting infections. Some chemotherapy affects your bone marrow and lowers your neutrophils. This medicine helps decrease the length of time that neutrophils are very low (severe neutropenia). This medicine may be used for other purposes; ask your health care provider or pharmacist if you have questions. COMMON BRAND NAME(S): Granix What should I tell my health care provider before I take this medicine? They need to know if you have any of these conditions:  bone scan or tests planned  kidney disease  sickle cell anemia  an unusual or allergic reaction to tbo-filgrastim, filgrastim, pegfilgrastim, other medicines, foods, dyes, or preservatives  pregnant or trying to get pregnant  breast-feeding How should I use this medicine? This medicine is for injection under the skin. If you get this medicine at home, you will be taught how to prepare and give this medicine. Refer to the Instructions for Use that come with your medication packaging. Use exactly as directed. Take your medicine at regular intervals. Do not take your medicine more often than directed. It is important that you put your used needles and syringes in a special sharps container. Do not put them in a trash can. If you do not have a sharps container, call your pharmacist or healthcare provider to get one. Talk to your pediatrician regarding the use of this medicine in children. While this drug may be prescribed for children as young as 1 month of age for selected conditions, precautions do apply. Overdosage: If you think you have taken too much of this medicine contact a poison control center or emergency room at once. NOTE: This medicine is only for you. Do not share this medicine with others. What if I miss a  dose? It is important not to miss your dose. Call your doctor or health care professional if you miss a dose. What may interact with this medicine? This medicine may interact with the following medications:  medicines that may cause a release of neutrophils, such as lithium This list may not describe all possible interactions. Give your health care provider a list of all the medicines, herbs, non-prescription drugs, or dietary supplements you use. Also tell them if you smoke, drink alcohol, or use illegal drugs. Some items may interact with your medicine. What should I watch for while using this medicine? You may need blood work done while you are taking this medicine. What side effects may I notice from receiving this medicine? Side effects that you should report to your doctor or health care professional as soon as possible:  allergic reactions like skin rash, itching or hives, swelling of the face, lips, or tongue  back pain  blood in the urine  dark urine  dizziness  fast heartbeat  feeling faint  shortness of breath or breathing problems  signs and symptoms of infection like fever or chills; cough; or sore throat  signs and symptoms of kidney injury like trouble passing urine or change in the amount of urine  stomach or side pain, or pain at the shoulder  sweating  swelling of the legs, ankles, or abdomen  tiredness Side effects that usually do not require medical attention (report to your doctor or health care professional if they continue or are bothersome):  bone pain  diarrhea  headache  muscle pain  vomiting This list may   not describe all possible side effects. Call your doctor for medical advice about side effects. You may report side effects to FDA at 1-800-FDA-1088. Where should I keep my medicine? Keep out of the reach of children. Store in a refrigerator between 2 and 8 degrees C (36 and 46 degrees F). Keep in carton to protect from light. Throw  away this medicine if it is left out of the refrigerator for more than 5 consecutive days. Throw away any unused medicine after the expiration date. NOTE: This sheet is a summary. It may not cover all possible information. If you have questions about this medicine, talk to your doctor, pharmacist, or health care provider.  2021 Elsevier/Gold Standard (2018-06-14 19:58:39)  

## 2021-01-09 ENCOUNTER — Inpatient Hospital Stay: Payer: Medicare Other

## 2021-01-09 ENCOUNTER — Telehealth: Payer: Self-pay | Admitting: Internal Medicine

## 2021-01-09 ENCOUNTER — Other Ambulatory Visit: Payer: Self-pay

## 2021-01-09 ENCOUNTER — Other Ambulatory Visit: Payer: Medicare Other

## 2021-01-09 VITALS — BP 112/65 | HR 100 | Temp 98.4°F | Resp 20 | Ht 68.0 in | Wt 216.7 lb

## 2021-01-09 DIAGNOSIS — C3411 Malignant neoplasm of upper lobe, right bronchus or lung: Secondary | ICD-10-CM | POA: Diagnosis not present

## 2021-01-09 DIAGNOSIS — Z85828 Personal history of other malignant neoplasm of skin: Secondary | ICD-10-CM | POA: Diagnosis not present

## 2021-01-09 DIAGNOSIS — Z5111 Encounter for antineoplastic chemotherapy: Secondary | ICD-10-CM | POA: Diagnosis not present

## 2021-01-09 DIAGNOSIS — C3490 Malignant neoplasm of unspecified part of unspecified bronchus or lung: Secondary | ICD-10-CM

## 2021-01-09 DIAGNOSIS — Z79899 Other long term (current) drug therapy: Secondary | ICD-10-CM | POA: Diagnosis not present

## 2021-01-09 DIAGNOSIS — E785 Hyperlipidemia, unspecified: Secondary | ICD-10-CM | POA: Diagnosis not present

## 2021-01-09 DIAGNOSIS — C3491 Malignant neoplasm of unspecified part of right bronchus or lung: Secondary | ICD-10-CM

## 2021-01-09 LAB — CBC WITH DIFFERENTIAL (CANCER CENTER ONLY)
Abs Immature Granulocytes: 0.24 10*3/uL — ABNORMAL HIGH (ref 0.00–0.07)
Basophils Absolute: 0 10*3/uL (ref 0.0–0.1)
Basophils Relative: 1 %
Eosinophils Absolute: 0 10*3/uL (ref 0.0–0.5)
Eosinophils Relative: 0 %
HCT: 33.7 % — ABNORMAL LOW (ref 36.0–46.0)
Hemoglobin: 11.5 g/dL — ABNORMAL LOW (ref 12.0–15.0)
Immature Granulocytes: 4 %
Lymphocytes Relative: 7 %
Lymphs Abs: 0.5 10*3/uL — ABNORMAL LOW (ref 0.7–4.0)
MCH: 32.4 pg (ref 26.0–34.0)
MCHC: 34.1 g/dL (ref 30.0–36.0)
MCV: 94.9 fL (ref 80.0–100.0)
Monocytes Absolute: 1.2 10*3/uL — ABNORMAL HIGH (ref 0.1–1.0)
Monocytes Relative: 17 %
Neutro Abs: 4.8 10*3/uL (ref 1.7–7.7)
Neutrophils Relative %: 71 %
Platelet Count: 251 10*3/uL (ref 150–400)
RBC: 3.55 MIL/uL — ABNORMAL LOW (ref 3.87–5.11)
RDW: 15.3 % (ref 11.5–15.5)
WBC Count: 6.7 10*3/uL (ref 4.0–10.5)
nRBC: 0 % (ref 0.0–0.2)

## 2021-01-09 LAB — CMP (CANCER CENTER ONLY)
ALT: 19 U/L (ref 0–44)
AST: 17 U/L (ref 15–41)
Albumin: 4 g/dL (ref 3.5–5.0)
Alkaline Phosphatase: 67 U/L (ref 38–126)
Anion gap: 8 (ref 5–15)
BUN: 9 mg/dL (ref 8–23)
CO2: 27 mmol/L (ref 22–32)
Calcium: 8.8 mg/dL — ABNORMAL LOW (ref 8.9–10.3)
Chloride: 103 mmol/L (ref 98–111)
Creatinine: 0.57 mg/dL (ref 0.44–1.00)
GFR, Estimated: >60 mL/min (ref >60)
Glucose, Bld: 123 mg/dL — ABNORMAL HIGH (ref 70–99)
Potassium: 3.7 mmol/L (ref 3.5–5.1)
Sodium: 138 mmol/L (ref 135–145)
Total Bilirubin: 0.3 mg/dL (ref 0.3–1.2)
Total Protein: 6.2 g/dL — ABNORMAL LOW (ref 6.5–8.1)

## 2021-01-09 MED ORDER — PALONOSETRON HCL INJECTION 0.25 MG/5ML
0.2500 mg | Freq: Once | INTRAVENOUS | Status: AC
  Administered 2021-01-09: 0.25 mg via INTRAVENOUS
  Filled 2021-01-09: qty 5

## 2021-01-09 MED ORDER — SODIUM CHLORIDE 0.9% FLUSH
10.0000 mL | INTRAVENOUS | Status: DC | PRN
  Filled 2021-01-09: qty 10

## 2021-01-09 MED ORDER — HEPARIN SOD (PORK) LOCK FLUSH 100 UNIT/ML IV SOLN
500.0000 [IU] | Freq: Once | INTRAVENOUS | Status: DC | PRN
  Filled 2021-01-09: qty 5

## 2021-01-09 MED ORDER — MAGNESIUM SULFATE 2 GM/50ML IV SOLN
2.0000 g | Freq: Once | INTRAVENOUS | Status: AC
  Administered 2021-01-09: 2 g via INTRAVENOUS
  Filled 2021-01-09: qty 50

## 2021-01-09 MED ORDER — SODIUM CHLORIDE 0.9 % IV SOLN
150.0000 mg | Freq: Once | INTRAVENOUS | Status: AC
  Administered 2021-01-09: 150 mg via INTRAVENOUS
  Filled 2021-01-09: qty 5

## 2021-01-09 MED ORDER — SODIUM CHLORIDE 0.9 % IV SOLN
10.0000 mg | Freq: Once | INTRAVENOUS | Status: AC
  Administered 2021-01-09: 10 mg via INTRAVENOUS
  Filled 2021-01-09: qty 1

## 2021-01-09 MED ORDER — POTASSIUM CHLORIDE IN NACL 20-0.9 MEQ/L-% IV SOLN
Freq: Once | INTRAVENOUS | Status: AC
Start: 2021-01-09 — End: 2021-01-09
  Filled 2021-01-09: qty 1000

## 2021-01-09 MED ORDER — SODIUM CHLORIDE 0.9 % IV SOLN
80.0000 mg/m2 | Freq: Once | INTRAVENOUS | Status: AC
  Administered 2021-01-09: 174 mg via INTRAVENOUS
  Filled 2021-01-09: qty 174

## 2021-01-09 MED ORDER — SODIUM CHLORIDE 0.9 % IV SOLN
Freq: Once | INTRAVENOUS | Status: AC
  Filled 2021-01-09: qty 250

## 2021-01-09 MED ORDER — SODIUM CHLORIDE 0.9 % IV SOLN
100.0000 mg/m2 | Freq: Once | INTRAVENOUS | Status: AC
  Administered 2021-01-09: 220 mg via INTRAVENOUS
  Filled 2021-01-09: qty 11

## 2021-01-09 NOTE — Telephone Encounter (Signed)
Scheduled appts per 5/13 sch msg. Pt aware.

## 2021-01-09 NOTE — Progress Notes (Signed)
Per Cassie Heilingoetter PA, patient can have fluids with Cisplatin infusion.

## 2021-01-09 NOTE — Patient Instructions (Signed)
[facility]    Discharge Instructions:  Thank you for choosing [facility] to provide your oncology and hematology care.   If you have a lab appointment with the [facility], please go directly to the [facility] and check in at the registration area.   Wear comfortable clothing and clothing appropriate for easy access to any Portacath or PICC line.   We strive to give you quality time with your provider. You may need to reschedule your appointment if you arrive late (15 or more minutes).  Arriving late affects you and other patients whose appointments are after yours.  Also, if you miss three or more appointments without notifying the office, you may be dismissed from the clinic at the provider's discretion.      For prescription refill requests, have your pharmacy contact our office and allow 72 hours for refills to be completed.    Today you received the following chemotherapy and/or immunotherapy agents Cisplatin (PLATINOL) & Etoposide (VEPESID).   To help prevent nausea and vomiting after your treatment, we encourage you to take your nausea medication as directed.  BELOW ARE SYMPTOMS THAT SHOULD BE REPORTED IMMEDIATELY: . *FEVER GREATER THAN 100.4 F (38 C) OR HIGHER . *CHILLS OR SWEATING . *NAUSEA AND VOMITING THAT IS NOT CONTROLLED WITH YOUR NAUSEA MEDICATION . *UNUSUAL SHORTNESS OF BREATH . *UNUSUAL BRUISING OR BLEEDING . *URINARY PROBLEMS (pain or burning when urinating, or frequent urination) . *BOWEL PROBLEMS (unusual diarrhea, constipation, pain near the anus) . TENDERNESS IN MOUTH AND THROAT WITH OR WITHOUT PRESENCE OF ULCERS (sore throat, sores in mouth, or a toothache) . UNUSUAL RASH, SWELLING OR PAIN  . UNUSUAL VAGINAL DISCHARGE OR ITCHING   Items with * indicate a potential emergency and should be followed up as soon as possible or go to the Emergency Department if any problems should occur.  Please show the CHEMOTHERAPY  ALERT CARD or IMMUNOTHERAPY ALERT CARD at check-in to the Emergency Department and triage nurse.  Should you have questions after your visit or need to cancel or reschedule your appointment, please contact [facility]  Dept: [phone]  and follow the prompts.  Office hours are 8:00 a.m. to 4:30 p.m. Monday - Friday. Please note that voicemails left after 4:00 p.m. may not be returned until the following business day.  We are closed weekends and major holidays. You have access to a nurse at all times for urgent questions. Please call the main number to the clinic Dept: [phone] and follow the prompts.   For any non-urgent questions, you may also contact your provider using MyChart. We now offer e-Visits for anyone 40 and older to request care online for non-urgent symptoms. For details visit [email or web address].   Also download the MyChart app! Go to the app store, search "MyChart", open the app, select [facility], and log in with your MyChart username and password.  Due to Covid, a mask is required upon entering the hospital/clinic. If you do not have a mask, one will be given to you upon arrival. For doctor visits, patients may have 1 support person aged 22 or older with them. For treatment visits, patients cannot have anyone with them due to current Covid guidelines and our immunocompromised population.   Cisplatin injection What is this medicine? CISPLATIN (SIS pla tin) is a chemotherapy drug. It targets fast dividing cells, like cancer cells, and causes these cells to die. This medicine is used to treat many  types of cancer like bladder, ovarian, and testicular cancers. This medicine may be used for other purposes; ask your health care provider or pharmacist if you have questions. COMMON BRAND NAME(S): Platinol, Platinol -AQ What should I tell my health care provider before I take this medicine? They need to know if you have any of these conditions:  eye  disease, vision problems  hearing problems  kidney disease  low blood counts, like white cells, platelets, or red blood cells  tingling of the fingers or toes, or other nerve disorder  an unusual or allergic reaction to cisplatin, carboplatin, oxaliplatin, other medicines, foods, dyes, or preservatives  pregnant or trying to get pregnant  breast-feeding How should I use this medicine? This drug is given as an infusion into a vein. It is administered in a hospital or clinic by a specially trained health care professional. Talk to your pediatrician regarding the use of this medicine in children. Special care may be needed. Overdosage: If you think you have taken too much of this medicine contact a poison control center or emergency room at once. NOTE: This medicine is only for you. Do not share this medicine with others. What if I miss a dose? It is important not to miss a dose. Call your doctor or health care professional if you are unable to keep an appointment. What may interact with this medicine? This medicine may interact with the following medications:  foscarnet  certain antibiotics like amikacin, gentamicin, neomycin, polymyxin B, streptomycin, tobramycin, vancomycin This list may not describe all possible interactions. Give your health care provider a list of all the medicines, herbs, non-prescription drugs, or dietary supplements you use. Also tell them if you smoke, drink alcohol, or use illegal drugs. Some items may interact with your medicine. What should I watch for while using this medicine? Your condition will be monitored carefully while you are receiving this medicine. You will need important blood work done while you are taking this medicine. This drug may make you feel generally unwell. This is not uncommon, as chemotherapy can affect healthy cells as well as cancer cells. Report any side effects. Continue your course of treatment even though you feel ill unless your  doctor tells you to stop. This medicine may increase your risk of getting an infection. Call your healthcare professional for advice if you get a fever, chills, or sore throat, or other symptoms of a cold or flu. Do not treat yourself. Try to avoid being around people who are sick. Avoid taking medicines that contain aspirin, acetaminophen, ibuprofen, naproxen, or ketoprofen unless instructed by your healthcare professional. These medicines may hide a fever. This medicine may increase your risk to bruise or bleed. Call your doctor or health care professional if you notice any unusual bleeding. Be careful brushing and flossing your teeth or using a toothpick because you may get an infection or bleed more easily. If you have any dental work done, tell your dentist you are receiving this medicine. Do not become pregnant while taking this medicine or for 14 months after stopping it. Women should inform their healthcare professional if they wish to become pregnant or think they might be pregnant. Men should not father a child while taking this medicine and for 11 months after stopping it. There is potential for serious side effects to an unborn child. Talk to your healthcare professional for more information. Do not breast-feed an infant while taking this medicine. This medicine has caused ovarian failure in some women. This  medicine may make it more difficult to get pregnant. Talk to your healthcare professional if you are concerned about your fertility. This medicine has caused decreased sperm counts in some men. This may make it more difficult to father a child. Talk to your healthcare professional if you are concerned about your fertility. Drink fluids as directed while you are taking this medicine. This will help protect your kidneys. Call your doctor or health care professional if you get diarrhea. Do not treat yourself. What side effects may I notice from receiving this medicine? Side effects that you  should report to your doctor or health care professional as soon as possible:  allergic reactions like skin rash, itching or hives, swelling of the face, lips, or tongue  blurred vision  changes in vision  decreased hearing or ringing of the ears  nausea, vomiting  pain, redness, or irritation at site where injected  pain, tingling, numbness in the hands or feet  signs and symptoms of bleeding such as bloody or black, tarry stools; red or dark brown urine; spitting up blood or brown material that looks like coffee grounds; red spots on the skin; unusual bruising or bleeding from the eyes, gums, or nose  signs and symptoms of infection like fever; chills; cough; sore throat; pain or trouble passing urine  signs and symptoms of kidney injury like trouble passing urine or change in the amount of urine  signs and symptoms of low red blood cells or anemia such as unusually weak or tired; feeling faint or lightheaded; falls; breathing problems Side effects that usually do not require medical attention (report to your doctor or health care professional if they continue or are bothersome):  loss of appetite  mouth sores  muscle cramps This list may not describe all possible side effects. Call your doctor for medical advice about side effects. You may report side effects to FDA at 1-800-FDA-1088. Where should I keep my medicine? This drug is given in a hospital or clinic and will not be stored at home. NOTE: This sheet is a summary. It may not cover all possible information. If you have questions about this medicine, talk to your doctor, pharmacist, or health care provider.  2021 Elsevier/Gold Standard (2018-08-08 15:59:17)  Etoposide, VP-16 capsules What is this medicine? ETOPOSIDE, VP-16 (e toe POE side) is a chemotherapy drug. It is used to treat small cell lung cancer and other cancers. This medicine may be used for other purposes; ask your health care provider or pharmacist if you  have questions. COMMON BRAND NAME(S): VePesid What should I tell my health care provider before I take this medicine? They need to know if you have any of these conditions:  infection  kidney disease  liver disease  low blood counts, like low white cell, platelet, or red cell counts  an unusual or allergic reaction to etoposide, other medicines, foods, dyes, or preservatives  pregnant or trying to get pregnant  breast-feeding How should I use this medicine? Take this medicine by mouth with a glass of water. Follow the directions on the prescription label. Do not open, crush, or chew the capsules. It is advisable to wear gloves when handling this medicine. Take your medicine at regular intervals. Do not take it more often than directed. Do not stop taking except on your doctor's advice. Talk to your pediatrician regarding the use of this medicine in children. Special care may be needed. Overdosage: If you think you have taken too much of this medicine contact  a poison control center or emergency room at once. NOTE: This medicine is only for you. Do not share this medicine with others. What if I miss a dose? If you miss a dose, take it as soon as you can. If it is almost time for your next dose, take only that dose. Do not take double or extra doses. What may interact with this medicine? This medicine may interact with the following medications:  cyclosporine  warfarin This list may not describe all possible interactions. Give your health care provider a list of all the medicines, herbs, non-prescription drugs, or dietary supplements you use. Also tell them if you smoke, drink alcohol, or use illegal drugs. Some items may interact with your medicine. What should I watch for while using this medicine? Visit your doctor for checks on your progress. This drug may make you feel generally unwell. This is not uncommon, as chemotherapy can affect healthy cells as well as cancer cells. Report  any side effects. Continue your course of treatment even though you feel ill unless your doctor tells you to stop. In some cases, you may be given additional medicines to help with side effects. Follow all directions for their use. Call your doctor or health care professional for advice if you get a fever, chills or sore throat, or other symptoms of a cold or flu. Do not treat yourself. This drug decreases your body's ability to fight infections. Try to avoid being around people who are sick. This medicine may increase your risk to bruise or bleed. Call your doctor or health care professional if you notice any unusual bleeding. Talk to your doctor about your risk of cancer. You may be more at risk for certain types of cancers if you take this medicine. Do not become pregnant while taking this medicine or for at least 6 months after stopping it. Women should inform their doctor if they wish to become pregnant or think they might be pregnant. Women of child-bearing potential will need to have a negative pregnancy test before starting this medicine. There is a potential for serious side effects to an unborn child. Talk to your health care professional or pharmacist for more information. Do not breast-feed an infant while taking this medicine. Men must use a latex condom during sexual contact with a woman while taking this medicine and for at least 4 months after stopping it. A latex condom is needed even if you have had a vasectomy. Contact your doctor right away if your partner becomes pregnant. Do not donate sperm while taking this medicine and for 4 months after you stop taking this medicine. Men should inform their doctors if they wish to father a child. This medicine may lower sperm counts. What side effects may I notice from receiving this medicine? Side effects that you should report to your doctor or health care professional as soon as possible:  allergic reactions like skin rash, itching or hives,  swelling of the face, lips, or tongue  low blood counts - this medicine may decrease the number of white blood cells, red blood cells, and platelets. You may be at increased risk for infections and bleeding  nausea, vomiting  redness, blistering, peeling or loosening of the skin, including inside the mouth  signs and symptoms of infection like fever; chills; cough; sore throat; pain or trouble passing urine  signs and symptoms of low red blood cells or anemia such as unusually weak or tired; feeling faint or lightheaded; falls; breathing problems  unusual bruising or bleeding Side effects that usually do not require medical attention (report to your doctor or health care professional if they continue or are bothersome):  changes in taste  diarrhea  hair loss  loss of appetite  mouth sores This list may not describe all possible side effects. Call your doctor for medical advice about side effects. You may report side effects to FDA at 1-800-FDA-1088. Where should I keep my medicine? Keep out of the reach of children. Store in a refrigerator between 2 and 8 degrees C (36 and 46 degrees F). Do not freeze. Throw away any unused medicine after the expiration date. NOTE: This sheet is a summary. It may not cover all possible information. If you have questions about this medicine, talk to your doctor, pharmacist, or health care provider.  2021 Elsevier/Gold Standard (2018-10-08 16:44:55)

## 2021-01-10 ENCOUNTER — Telehealth: Payer: Self-pay | Admitting: Registered Nurse

## 2021-01-10 ENCOUNTER — Inpatient Hospital Stay: Payer: Medicare Other

## 2021-01-10 VITALS — BP 148/75 | HR 96 | Temp 98.2°F | Resp 18

## 2021-01-10 DIAGNOSIS — C3491 Malignant neoplasm of unspecified part of right bronchus or lung: Secondary | ICD-10-CM

## 2021-01-10 DIAGNOSIS — Z5111 Encounter for antineoplastic chemotherapy: Secondary | ICD-10-CM | POA: Diagnosis not present

## 2021-01-10 DIAGNOSIS — C3411 Malignant neoplasm of upper lobe, right bronchus or lung: Secondary | ICD-10-CM | POA: Diagnosis not present

## 2021-01-10 DIAGNOSIS — E785 Hyperlipidemia, unspecified: Secondary | ICD-10-CM | POA: Diagnosis not present

## 2021-01-10 DIAGNOSIS — Z79899 Other long term (current) drug therapy: Secondary | ICD-10-CM | POA: Diagnosis not present

## 2021-01-10 DIAGNOSIS — Z85828 Personal history of other malignant neoplasm of skin: Secondary | ICD-10-CM | POA: Diagnosis not present

## 2021-01-10 MED ORDER — SODIUM CHLORIDE 0.9 % IV SOLN
10.0000 mg | Freq: Once | INTRAVENOUS | Status: AC
  Administered 2021-01-10: 10 mg via INTRAVENOUS
  Filled 2021-01-10: qty 1

## 2021-01-10 MED ORDER — HEPARIN SOD (PORK) LOCK FLUSH 100 UNIT/ML IV SOLN
500.0000 [IU] | Freq: Once | INTRAVENOUS | Status: AC | PRN
  Administered 2021-01-10: 500 [IU]
  Filled 2021-01-10: qty 5

## 2021-01-10 MED ORDER — SODIUM CHLORIDE 0.9% FLUSH
10.0000 mL | INTRAVENOUS | Status: DC | PRN
  Administered 2021-01-10: 10 mL
  Filled 2021-01-10: qty 10

## 2021-01-10 MED ORDER — SODIUM CHLORIDE 0.9 % IV SOLN
Freq: Once | INTRAVENOUS | Status: AC
  Filled 2021-01-10: qty 250

## 2021-01-10 MED ORDER — SODIUM CHLORIDE 0.9 % IV SOLN
100.0000 mg/m2 | Freq: Once | INTRAVENOUS | Status: AC
  Administered 2021-01-10: 220 mg via INTRAVENOUS
  Filled 2021-01-10: qty 11

## 2021-01-10 NOTE — Patient Instructions (Signed)
[facility]    Discharge Instructions:  Thank you for choosing [facility] to provide your oncology and hematology care.   If you have a lab appointment with the [facility], please go directly to the [facility] and check in at the registration area.   Wear comfortable clothing and clothing appropriate for easy access to any Portacath or PICC line.   We strive to give you quality time with your provider. You may need to reschedule your appointment if you arrive late (15 or more minutes).  Arriving late affects you and other patients whose appointments are after yours.  Also, if you miss three or more appointments without notifying the office, you may be dismissed from the clinic at the provider's discretion.      For prescription refill requests, have your pharmacy contact our office and allow 72 hours for refills to be completed.    Today you received the following chemotherapy and/or immunotherapy agents Etoposide (VEPESID).   To help prevent nausea and vomiting after your treatment, we encourage you to take your nausea medication as directed.  BELOW ARE SYMPTOMS THAT SHOULD BE REPORTED IMMEDIATELY: . *FEVER GREATER THAN 100.4 F (38 C) OR HIGHER . *CHILLS OR SWEATING . *NAUSEA AND VOMITING THAT IS NOT CONTROLLED WITH YOUR NAUSEA MEDICATION . *UNUSUAL SHORTNESS OF BREATH . *UNUSUAL BRUISING OR BLEEDING . *URINARY PROBLEMS (pain or burning when urinating, or frequent urination) . *BOWEL PROBLEMS (unusual diarrhea, constipation, pain near the anus) . TENDERNESS IN MOUTH AND THROAT WITH OR WITHOUT PRESENCE OF ULCERS (sore throat, sores in mouth, or a toothache) . UNUSUAL RASH, SWELLING OR PAIN  . UNUSUAL VAGINAL DISCHARGE OR ITCHING   Items with * indicate a potential emergency and should be followed up as soon as possible or go to the Emergency Department if any problems should occur.  Please show the CHEMOTHERAPY ALERT CARD or  IMMUNOTHERAPY ALERT CARD at check-in to the Emergency Department and triage nurse.  Should you have questions after your visit or need to cancel or reschedule your appointment, please contact [facility]  Dept: [phone]  and follow the prompts.  Office hours are 8:00 a.m. to 4:30 p.m. Monday - Friday. Please note that voicemails left after 4:00 p.m. may not be returned until the following business day.  We are closed weekends and major holidays. You have access to a nurse at all times for urgent questions. Please call the main number to the clinic Dept: [phone] and follow the prompts.   For any non-urgent questions, you may also contact your provider using MyChart. We now offer e-Visits for anyone 55 and older to request care online for non-urgent symptoms. For details visit [email or web address].   Also download the MyChart app! Go to the app store, search "MyChart", open the app, select [facility], and log in with your MyChart username and password.  Due to Covid, a mask is required upon entering the hospital/clinic. If you do not have a mask, one will be given to you upon arrival. For doctor visits, patients may have 1 support person aged 56 or older with them. For treatment visits, patients cannot have anyone with them due to current Covid guidelines and our immunocompromised population.   Etoposide, VP-16 capsules What is this medicine? ETOPOSIDE, VP-16 (e toe POE side) is a chemotherapy drug. It is used to treat small cell lung cancer and other cancers. This medicine may be used for other purposes; ask  your health care provider or pharmacist if you have questions. COMMON BRAND NAME(S): VePesid What should I tell my health care provider before I take this medicine? They need to know if you have any of these conditions:  infection  kidney disease  liver disease  low blood counts, like low white cell, platelet, or red cell counts  an unusual or  allergic reaction to etoposide, other medicines, foods, dyes, or preservatives  pregnant or trying to get pregnant  breast-feeding How should I use this medicine? Take this medicine by mouth with a glass of water. Follow the directions on the prescription label. Do not open, crush, or chew the capsules. It is advisable to wear gloves when handling this medicine. Take your medicine at regular intervals. Do not take it more often than directed. Do not stop taking except on your doctor's advice. Talk to your pediatrician regarding the use of this medicine in children. Special care may be needed. Overdosage: If you think you have taken too much of this medicine contact a poison control center or emergency room at once. NOTE: This medicine is only for you. Do not share this medicine with others. What if I miss a dose? If you miss a dose, take it as soon as you can. If it is almost time for your next dose, take only that dose. Do not take double or extra doses. What may interact with this medicine? This medicine may interact with the following medications:  cyclosporine  warfarin This list may not describe all possible interactions. Give your health care provider a list of all the medicines, herbs, non-prescription drugs, or dietary supplements you use. Also tell them if you smoke, drink alcohol, or use illegal drugs. Some items may interact with your medicine. What should I watch for while using this medicine? Visit your doctor for checks on your progress. This drug may make you feel generally unwell. This is not uncommon, as chemotherapy can affect healthy cells as well as cancer cells. Report any side effects. Continue your course of treatment even though you feel ill unless your doctor tells you to stop. In some cases, you may be given additional medicines to help with side effects. Follow all directions for their use. Call your doctor or health care professional for advice if you get a fever,  chills or sore throat, or other symptoms of a cold or flu. Do not treat yourself. This drug decreases your body's ability to fight infections. Try to avoid being around people who are sick. This medicine may increase your risk to bruise or bleed. Call your doctor or health care professional if you notice any unusual bleeding. Talk to your doctor about your risk of cancer. You may be more at risk for certain types of cancers if you take this medicine. Do not become pregnant while taking this medicine or for at least 6 months after stopping it. Women should inform their doctor if they wish to become pregnant or think they might be pregnant. Women of child-bearing potential will need to have a negative pregnancy test before starting this medicine. There is a potential for serious side effects to an unborn child. Talk to your health care professional or pharmacist for more information. Do not breast-feed an infant while taking this medicine. Men must use a latex condom during sexual contact with a woman while taking this medicine and for at least 4 months after stopping it. A latex condom is needed even if you have had a vasectomy. Contact  your doctor right away if your partner becomes pregnant. Do not donate sperm while taking this medicine and for 4 months after you stop taking this medicine. Men should inform their doctors if they wish to father a child. This medicine may lower sperm counts. What side effects may I notice from receiving this medicine? Side effects that you should report to your doctor or health care professional as soon as possible:  allergic reactions like skin rash, itching or hives, swelling of the face, lips, or tongue  low blood counts - this medicine may decrease the number of white blood cells, red blood cells, and platelets. You may be at increased risk for infections and bleeding  nausea, vomiting  redness, blistering, peeling or loosening of the skin, including inside the  mouth  signs and symptoms of infection like fever; chills; cough; sore throat; pain or trouble passing urine  signs and symptoms of low red blood cells or anemia such as unusually weak or tired; feeling faint or lightheaded; falls; breathing problems  unusual bruising or bleeding Side effects that usually do not require medical attention (report to your doctor or health care professional if they continue or are bothersome):  changes in taste  diarrhea  hair loss  loss of appetite  mouth sores This list may not describe all possible side effects. Call your doctor for medical advice about side effects. You may report side effects to FDA at 1-800-FDA-1088. Where should I keep my medicine? Keep out of the reach of children. Store in a refrigerator between 2 and 8 degrees C (36 and 46 degrees F). Do not freeze. Throw away any unused medicine after the expiration date. NOTE: This sheet is a summary. It may not cover all possible information. If you have questions about this medicine, talk to your doctor, pharmacist, or health care provider.  2021 Elsevier/Gold Standard (2018-10-08 16:44:55)

## 2021-01-10 NOTE — Telephone Encounter (Signed)
Patient has an appointment with Susan Cortez on Friday 01/13/21 and was requesting a virtual visit with her.  She has started chemo and would like to know if she could.  Please advise.  Thank you.

## 2021-01-11 ENCOUNTER — Other Ambulatory Visit: Payer: Self-pay

## 2021-01-11 ENCOUNTER — Inpatient Hospital Stay: Payer: Medicare Other

## 2021-01-11 VITALS — BP 160/89 | HR 100 | Temp 98.1°F | Resp 18

## 2021-01-11 DIAGNOSIS — C3491 Malignant neoplasm of unspecified part of right bronchus or lung: Secondary | ICD-10-CM

## 2021-01-11 DIAGNOSIS — E785 Hyperlipidemia, unspecified: Secondary | ICD-10-CM | POA: Diagnosis not present

## 2021-01-11 DIAGNOSIS — Z5111 Encounter for antineoplastic chemotherapy: Secondary | ICD-10-CM | POA: Diagnosis not present

## 2021-01-11 DIAGNOSIS — C3411 Malignant neoplasm of upper lobe, right bronchus or lung: Secondary | ICD-10-CM | POA: Diagnosis not present

## 2021-01-11 DIAGNOSIS — Z85828 Personal history of other malignant neoplasm of skin: Secondary | ICD-10-CM | POA: Diagnosis not present

## 2021-01-11 DIAGNOSIS — Z79899 Other long term (current) drug therapy: Secondary | ICD-10-CM | POA: Diagnosis not present

## 2021-01-11 MED ORDER — SODIUM CHLORIDE 0.9% FLUSH
10.0000 mL | INTRAVENOUS | Status: DC | PRN
  Administered 2021-01-11: 10 mL
  Filled 2021-01-11: qty 10

## 2021-01-11 MED ORDER — SODIUM CHLORIDE 0.9 % IV SOLN
100.0000 mg/m2 | Freq: Once | INTRAVENOUS | Status: AC
  Administered 2021-01-11: 220 mg via INTRAVENOUS
  Filled 2021-01-11: qty 11

## 2021-01-11 MED ORDER — SODIUM CHLORIDE 0.9 % IV SOLN
Freq: Once | INTRAVENOUS | Status: AC
  Filled 2021-01-11: qty 250

## 2021-01-11 MED ORDER — HEPARIN SOD (PORK) LOCK FLUSH 100 UNIT/ML IV SOLN
500.0000 [IU] | Freq: Once | INTRAVENOUS | Status: AC | PRN
  Administered 2021-01-11: 500 [IU]
  Filled 2021-01-11: qty 5

## 2021-01-11 MED ORDER — SODIUM CHLORIDE 0.9 % IV SOLN
10.0000 mg | Freq: Once | INTRAVENOUS | Status: AC
  Administered 2021-01-11: 10 mg via INTRAVENOUS
  Filled 2021-01-11: qty 1

## 2021-01-11 NOTE — Patient Instructions (Signed)
[place]    Discharge Instructions:  Thank you for choosing [facility] to provide your oncology and hematology care.   If you have a lab appointment with the [facility], please go directly to the [facility] and check in at the registration area.   Wear comfortable clothing and clothing appropriate for easy access to any Portacath or PICC line.   We strive to give you quality time with your provider. You may need to reschedule your appointment if you arrive late (15 or more minutes).  Arriving late affects you and other patients whose appointments are after yours.  Also, if you miss three or more appointments without notifying the office, you may be dismissed from the clinic at the provider's discretion.      For prescription refill requests, have your pharmacy contact our office and allow 72 hours for refills to be completed.    Today you received the following chemotherapy and/or immunotherapy agents Etoposide (VEPESID).      To help prevent nausea and vomiting after your treatment, we encourage you to take your nausea medication as directed.  BELOW ARE SYMPTOMS THAT SHOULD BE REPORTED IMMEDIATELY: . *FEVER GREATER THAN 100.4 F (38 C) OR HIGHER . *CHILLS OR SWEATING . *NAUSEA AND VOMITING THAT IS NOT CONTROLLED WITH YOUR NAUSEA MEDICATION . *UNUSUAL SHORTNESS OF BREATH . *UNUSUAL BRUISING OR BLEEDING . *URINARY PROBLEMS (pain or burning when urinating, or frequent urination) . *BOWEL PROBLEMS (unusual diarrhea, constipation, pain near the anus) . TENDERNESS IN MOUTH AND THROAT WITH OR WITHOUT PRESENCE OF ULCERS (sore throat, sores in mouth, or a toothache) . UNUSUAL RASH, SWELLING OR PAIN  . UNUSUAL VAGINAL DISCHARGE OR ITCHING   Items with * indicate a potential emergency and should be followed up as soon as possible or go to the Emergency Department if any problems should occur.  Please show the CHEMOTHERAPY ALERT CARD or  IMMUNOTHERAPY ALERT CARD at check-in to the Emergency Department and triage nurse.  Should you have questions after your visit or need to cancel or reschedule your appointment, please contact [facility]  Dept: [phone]  and follow the prompts.  Office hours are 8:00 a.m. to 4:30 p.m. Monday - Friday. Please note that voicemails left after 4:00 p.m. may not be returned until the following business day.  We are closed weekends and major holidays. You have access to a nurse at all times for urgent questions. Please call the main number to the clinic Dept: [phone] and follow the prompts.   For any non-urgent questions, you may also contact your provider using MyChart. We now offer e-Visits for anyone 45 and older to request care online for non-urgent symptoms. For details visit [email or web address].   Also download the MyChart app! Go to the app store, search "MyChart", open the app, select [facility], and log in with your MyChart username and password.  Due to Covid, a mask is required upon entering the hospital/clinic. If you do not have a mask, one will be given to you upon arrival. For doctor visits, patients may have 1 support person aged 36 or older with them. For treatment visits, patients cannot have anyone with them due to current Covid guidelines and our immunocompromised population.   Etoposide, VP-16 injection What is this medicine? ETOPOSIDE, VP-16 (e toe POE side) is a chemotherapy drug. It is used to treat testicular cancer, lung cancer, and other cancers. This medicine may be used for  other purposes; ask your health care provider or pharmacist if you have questions. COMMON BRAND NAME(S): Etopophos, Toposar, VePesid What should I tell my health care provider before I take this medicine? They need to know if you have any of these conditions:  infection  kidney disease  liver disease  low blood counts, like low white cell, platelet, or red  cell counts  an unusual or allergic reaction to etoposide, other medicines, foods, dyes, or preservatives  pregnant or trying to get pregnant  breast-feeding How should I use this medicine? This medicine is for infusion into a vein. It is administered in a hospital or clinic by a specially trained health care professional. Talk to your pediatrician regarding the use of this medicine in children. Special care may be needed. Overdosage: If you think you have taken too much of this medicine contact a poison control center or emergency room at once. NOTE: This medicine is only for you. Do not share this medicine with others. What if I miss a dose? It is important not to miss your dose. Call your doctor or health care professional if you are unable to keep an appointment. What may interact with this medicine? This medicine may interact with the following medications:  warfarin This list may not describe all possible interactions. Give your health care provider a list of all the medicines, herbs, non-prescription drugs, or dietary supplements you use. Also tell them if you smoke, drink alcohol, or use illegal drugs. Some items may interact with your medicine. What should I watch for while using this medicine? Visit your doctor for checks on your progress. This drug may make you feel generally unwell. This is not uncommon, as chemotherapy can affect healthy cells as well as cancer cells. Report any side effects. Continue your course of treatment even though you feel ill unless your doctor tells you to stop. In some cases, you may be given additional medicines to help with side effects. Follow all directions for their use. Call your doctor or health care professional for advice if you get a fever, chills or sore throat, or other symptoms of a cold or flu. Do not treat yourself. This drug decreases your body's ability to fight infections. Try to avoid being around people who are sick. This medicine may  increase your risk to bruise or bleed. Call your doctor or health care professional if you notice any unusual bleeding. Talk to your doctor about your risk of cancer. You may be more at risk for certain types of cancers if you take this medicine. Do not become pregnant while taking this medicine or for at least 6 months after stopping it. Women should inform their doctor if they wish to become pregnant or think they might be pregnant. Women of child-bearing potential will need to have a negative pregnancy test before starting this medicine. There is a potential for serious side effects to an unborn child. Talk to your health care professional or pharmacist for more information. Do not breast-feed an infant while taking this medicine. Men must use a latex condom during sexual contact with a woman while taking this medicine and for at least 4 months after stopping it. A latex condom is needed even if you have had a vasectomy. Contact your doctor right away if your partner becomes pregnant. Do not donate sperm while taking this medicine and for at least 4 months after you stop taking this medicine. Men should inform their doctors if they wish to father a child.  This medicine may lower sperm counts. What side effects may I notice from receiving this medicine? Side effects that you should report to your doctor or health care professional as soon as possible:  allergic reactions like skin rash, itching or hives, swelling of the face, lips, or tongue  low blood counts - this medicine may decrease the number of white blood cells, red blood cells, and platelets. You may be at increased risk for infections and bleeding  nausea, vomiting  redness, blistering, peeling or loosening of the skin, including inside the mouth  signs and symptoms of infection like fever; chills; cough; sore throat; pain or trouble passing urine  signs and symptoms of low red blood cells or anemia such as unusually weak or tired;  feeling faint or lightheaded; falls; breathing problems  unusual bruising or bleeding Side effects that usually do not require medical attention (report to your doctor or health care professional if they continue or are bothersome):  changes in taste  diarrhea  hair loss  loss of appetite  mouth sores This list may not describe all possible side effects. Call your doctor for medical advice about side effects. You may report side effects to FDA at 1-800-FDA-1088. Where should I keep my medicine? This drug is given in a hospital or clinic and will not be stored at home. NOTE: This sheet is a summary. It may not cover all possible information. If you have questions about this medicine, talk to your doctor, pharmacist, or health care provider.  2021 Elsevier/Gold Standard (2018-10-08 16:57:15)

## 2021-01-12 ENCOUNTER — Ambulatory Visit: Payer: Medicare Other | Admitting: Registered Nurse

## 2021-01-12 NOTE — Telephone Encounter (Signed)
Please schedule Susan Cortez for a Virtual visit

## 2021-01-12 NOTE — Progress Notes (Signed)
  Patient Name: Susan Cortez MRN: [id number] DOB: 12/08/1960 Referring Physician: Curt Bears (Profile Not Attached) Date of Service: 01/04/2021 [facility],                                                         End Of Treatment Note  Diagnoses: C34.11-Malignant neoplasm of upper lobe, right bronchus or lung  Cancer Staging: Limited Stage Small Cell Carcinoma of the RUL.  Intent: Curative  Radiation Treatment Dates: 11/21/2020 through 01/04/2021 Site Technique Total Dose (Gy) Dose per Fx (Gy) Completed Fx Beam Energies  Lung, Right: Lung_Rt 3D 60/60 2 30/30 6X, 10X  Lung, Right: Lung_Rt_Bst 3D 6/6 2 3/3 6X, 10X   Narrative: The patient tolerated radiation therapy incredibly well and did not develop esophagitis at the conclusion of her treatment. She did have mild fatigue and anticipated skin changes in the treatment field.  Plan: The patient will receive a call in about one month from the radiation oncology department. She will continue follow up with Dr. Julien Nordmann for chemotherapy as well. It appears she will finish her 4th cycle of chemotherapy around 02/02/21. I will reach out to her soon after to revisit the discussion of PCI radiotherapy to the brain to reduce risks of developing brain disease.   ________________________________________________    Carola Rhine, PAC

## 2021-01-13 ENCOUNTER — Other Ambulatory Visit: Payer: Self-pay

## 2021-01-13 ENCOUNTER — Encounter: Payer: Medicare Other | Attending: Registered Nurse | Admitting: Registered Nurse

## 2021-01-13 VITALS — Ht 68.0 in | Wt 216.0 lb

## 2021-01-13 DIAGNOSIS — M545 Low back pain, unspecified: Secondary | ICD-10-CM

## 2021-01-13 DIAGNOSIS — G894 Chronic pain syndrome: Secondary | ICD-10-CM | POA: Diagnosis not present

## 2021-01-13 DIAGNOSIS — G8929 Other chronic pain: Secondary | ICD-10-CM

## 2021-01-13 DIAGNOSIS — Z5181 Encounter for therapeutic drug level monitoring: Secondary | ICD-10-CM

## 2021-01-13 DIAGNOSIS — M25511 Pain in right shoulder: Secondary | ICD-10-CM | POA: Diagnosis not present

## 2021-01-13 DIAGNOSIS — M542 Cervicalgia: Secondary | ICD-10-CM | POA: Diagnosis not present

## 2021-01-13 DIAGNOSIS — M4802 Spinal stenosis, cervical region: Secondary | ICD-10-CM | POA: Diagnosis not present

## 2021-01-13 DIAGNOSIS — M5412 Radiculopathy, cervical region: Secondary | ICD-10-CM

## 2021-01-13 DIAGNOSIS — M546 Pain in thoracic spine: Secondary | ICD-10-CM | POA: Diagnosis not present

## 2021-01-13 DIAGNOSIS — Z79891 Long term (current) use of opiate analgesic: Secondary | ICD-10-CM

## 2021-01-13 MED ORDER — ACETAMINOPHEN-CODEINE #4 300-60 MG PO TABS: 1.0000 | ORAL_TABLET | Freq: Every day | ORAL | 2 refills | Status: DC

## 2021-01-13 NOTE — Progress Notes (Signed)
Subjective:    Patient ID: Susan Cortez, female    DOB: [birth year], 60 y.o.   MRN: [id number]  HPI: Susan Cortez is a 60 y.o. female whose appointment was changed to a My-Chart Video Visit, she was unable to access the My-Chart visit changed to a telephone call. Susan Cortez undergoing chemotherapy, sh agrees with telephone visit and verbalizes understanding. She states her pain is located in her neck radiating into her right shoulder and upper- mid back mainly on the right side. She rates her pain 5. Her  current exercise regime is walking.   Susan Cortez was diagnosed with right lung cell carcinoma, Dr Earlie Server following.   [name] Morphine equivalent is 9.00 MME.  Last UDS was Performed on 01/11/80, it was consistent.    Pain Inventory Average Pain 5 Pain Right Now 5 My pain is constant, sharp and stabbing  In the last 24 hours, has pain interfered with the following? General activity 5 Relation with others 5 Enjoyment of life 5 What TIME of day is your pain at its worst? morning , daytime, evening and night Sleep (in general) Poor  Pain is worse with: walking, standing and some activites Pain improves with: rest, pacing activities and medication Relief from Meds: 7  Family History  Problem Relation Age of Onset  . Diabetes Father   . Heart disease Father   . Hyperlipidemia Father   . Colon cancer Neg Hx   . Colon polyps Neg Hx   . Esophageal cancer Neg Hx   . Rectal cancer Neg Hx   . Stomach cancer Neg Hx    Social History   Socioeconomic History  . Marital status: Divorced    Spouse name: Not on file  . Number of children: Not on file  . Years of education: Not on file  . Highest education level: Not on file  Occupational History  . Not on file  Tobacco Use  . Smoking status: Current Every Day Smoker    Packs/day: 1.00    Years: 45.00    Pack years: 45.00    Types: Cigarettes  . Smokeless tobacco: Never Used  . Tobacco comment: 5-6 cigarettes  smoked daily ARJ 12/19/20  Vaping Use  . Vaping Use: Former  Substance and Sexual Activity  . Alcohol use: No  . Drug use: No  . Sexual activity: Not on file    Comment: Tubal Ligation  Other Topics Concern  . Not on file  Social History Narrative  . Not on file   Social Determinants of Health   Financial Resource Strain: Low Risk   . Difficulty of Paying Living Expenses: Not hard at all  Food Insecurity: No Food Insecurity  . Worried About Charity fundraiser in the Last Year: Never true  . Ran Out of Food in the Last Year: Never true  Transportation Needs: No Transportation Needs  . Lack of Transportation (Medical): No  . Lack of Transportation (Non-Medical): No  Physical Activity: Not on file  Stress: Not on file  Social Connections: Not on file   Past Surgical History:  Procedure Laterality Date  . CESAREAN SECTION     X 3  . COLONOSCOPY  2013, 2016, 2019   hx colon polyps  . DILATION AND CURETTAGE OF UTERUS    . FUDUCIAL PLACEMENT Right 10/18/2020   Procedure: PLACEMENT OF FUDUCIAL;  Surgeon: Collene Gobble, MD;  Location: [facility];  Service: Thoracic;  Laterality: Right;  . IR IMAGING  GUIDED PORT INSERTION  11/29/2020  . SHOULDER ARTHROSCOPY WITH OPEN ROTATOR CUFF REPAIR Right 05/2009  . TONSILLECTOMY  1977  . TUBAL LIGATION    . VIDEO BRONCHOSCOPY WITH ENDOBRONCHIAL NAVIGATION N/A 10/18/2020   Procedure: VIDEO BRONCHOSCOPY WITH ENDOBRONCHIAL NAVIGATION;  Surgeon: Collene Gobble, MD;  Location: [facility];  Service: Thoracic;  Laterality: N/A;  . VIDEO BRONCHOSCOPY WITH ENDOBRONCHIAL ULTRASOUND N/A 10/18/2020   Procedure: VIDEO BRONCHOSCOPY WITH ENDOBRONCHIAL ULTRASOUND;  Surgeon: Collene Gobble, MD;  Location: [facility] OR;  Service: Thoracic;  Laterality: N/A;  . WISDOM TOOTH EXTRACTION     Past Surgical History:  Procedure Laterality Date  . CESAREAN SECTION     X 3  . COLONOSCOPY  2013, 2016, 2019   hx colon polyps  . DILATION AND CURETTAGE OF UTERUS    . FUDUCIAL  PLACEMENT Right 10/18/2020   Procedure: PLACEMENT OF FUDUCIAL;  Surgeon: Collene Gobble, MD;  Location: [facility];  Service: Thoracic;  Laterality: Right;  . IR IMAGING GUIDED PORT INSERTION  11/29/2020  . SHOULDER ARTHROSCOPY WITH OPEN ROTATOR CUFF REPAIR Right 05/2009  . TONSILLECTOMY  1977  . TUBAL LIGATION    . VIDEO BRONCHOSCOPY WITH ENDOBRONCHIAL NAVIGATION N/A 10/18/2020   Procedure: VIDEO BRONCHOSCOPY WITH ENDOBRONCHIAL NAVIGATION;  Surgeon: Collene Gobble, MD;  Location: [facility];  Service: Thoracic;  Laterality: N/A;  . VIDEO BRONCHOSCOPY WITH ENDOBRONCHIAL ULTRASOUND N/A 10/18/2020   Procedure: VIDEO BRONCHOSCOPY WITH ENDOBRONCHIAL ULTRASOUND;  Surgeon: Collene Gobble, MD;  Location: [facility];  Service: Thoracic;  Laterality: N/A;  . WISDOM TOOTH EXTRACTION     Past Medical History:  Diagnosis Date  . Allergy   . Arthritis   . Basal cell carcinoma 2018   Removed from Right neck per patient   . Diverticulosis 2013  . HLD (hyperlipidemia)   . Hx of adenomatous colonic polyps 2013  . OAB (overactive bladder)   . Pre-diabetes   . Small cell lung cancer ([place]) 11/09/2020   Ht 5\' 8"  (1.727 m)   Wt 216 lb (98 kg) Comment: pt reported, virtual visit  LMP  (LMP Unknown)   BMI 32.84 kg/m   Opioid Risk Score:   Fall Risk Score:  `1  Depression screen PHQ 2/9  Depression screen [facility] 2/9 08/23/2020 03/10/2020 03/09/2019 12/29/2018 03/07/2018 01/03/2018 06/26/2017  Decreased Interest 0 0 0 0 0 0 0  Down, Depressed, Hopeless 0 0 0 0 0 0 0  PHQ - 2 Score 0 0 0 0 0 0 0  Some recent data might be hidden     Review of Systems  Musculoskeletal: Positive for back pain and neck pain.       Rt shoulder pain  All other systems reviewed and are negative.      Objective:   Physical Exam Vitals and nursing note reviewed.  Musculoskeletal:     Cervical back: Neck supple.     Comments: No Physical Exam Performed: Telephone visit           Assessment & Plan:  1.Cervical spinal stenosis  without evidence ofradiculopathy. Chronic neck pain: Refilled Tylenol #4 300/60mg  daily, #30.01/13/2021. We will continue the opioid monitoring program, this consists of regular clinic visits, examinations, urine drug screen, pill counts as well as use of [place] Controlled Substance Reporting system. A 12 month History has been reviewed on the [place] Controlled Substance Reporting Systemon05/20/2022. 2. Right shoulder rotator cuff tear, status post repair with chronic pain and contracture : Continue with exercise regimeas  tolerated.01/13/2021. 3. Insomnia:Continue Melatonin.01/13/2021 4. Chronic Bilateral Lower Back Pain without Sciatica: Continue HEP as tolerated. Continue to Monitor.01/13/2021 5. Right Greater Trochanter Bursitis:No complaints Today.Continue to Alternate Ice and Heat Therapy. Continue to Monitor.01/13/2021 6. Chronic right Sided Thoracic Back Pain: Continue current medication regime. Continue HEP as tolerated. Continue to monitor.01/13/2021  F/U in 3 months Telephone Visit Established Patient Location of Patient: In Her Home Location of Provider: In the Office Total Time Spent: 10 Minutes   F/U in  1 month

## 2021-01-16 ENCOUNTER — Other Ambulatory Visit: Payer: Medicare Other

## 2021-01-16 ENCOUNTER — Other Ambulatory Visit: Payer: Self-pay

## 2021-01-16 ENCOUNTER — Inpatient Hospital Stay: Payer: Medicare Other

## 2021-01-16 DIAGNOSIS — Z95828 Presence of other vascular implants and grafts: Secondary | ICD-10-CM

## 2021-01-16 DIAGNOSIS — E785 Hyperlipidemia, unspecified: Secondary | ICD-10-CM | POA: Diagnosis not present

## 2021-01-16 DIAGNOSIS — C3411 Malignant neoplasm of upper lobe, right bronchus or lung: Secondary | ICD-10-CM | POA: Diagnosis not present

## 2021-01-16 DIAGNOSIS — C3490 Malignant neoplasm of unspecified part of unspecified bronchus or lung: Secondary | ICD-10-CM

## 2021-01-16 DIAGNOSIS — Z79899 Other long term (current) drug therapy: Secondary | ICD-10-CM | POA: Diagnosis not present

## 2021-01-16 DIAGNOSIS — Z5111 Encounter for antineoplastic chemotherapy: Secondary | ICD-10-CM | POA: Diagnosis not present

## 2021-01-16 DIAGNOSIS — C3491 Malignant neoplasm of unspecified part of right bronchus or lung: Secondary | ICD-10-CM

## 2021-01-16 DIAGNOSIS — Z85828 Personal history of other malignant neoplasm of skin: Secondary | ICD-10-CM | POA: Diagnosis not present

## 2021-01-16 LAB — CMP (CANCER CENTER ONLY)
ALT: 31 U/L (ref 0–44)
AST: 18 U/L (ref 15–41)
Albumin: 3.9 g/dL (ref 3.5–5.0)
Alkaline Phosphatase: 55 U/L (ref 38–126)
Anion gap: 9 (ref 5–15)
BUN: 18 mg/dL (ref 8–23)
CO2: 27 mmol/L (ref 22–32)
Calcium: 9.7 mg/dL (ref 8.9–10.3)
Chloride: 100 mmol/L (ref 98–111)
Creatinine: 0.94 mg/dL (ref 0.44–1.00)
GFR, Estimated: >60 mL/min (ref >60)
Glucose, Bld: 136 mg/dL — ABNORMAL HIGH (ref 70–99)
Potassium: 3.5 mmol/L (ref 3.5–5.1)
Sodium: 136 mmol/L (ref 135–145)
Total Bilirubin: 0.9 mg/dL (ref 0.3–1.2)
Total Protein: 6.6 g/dL (ref 6.5–8.1)

## 2021-01-16 LAB — CBC WITH DIFFERENTIAL (CANCER CENTER ONLY)
Abs Immature Granulocytes: 0.06 10*3/uL (ref 0.00–0.07)
Basophils Absolute: 0 10*3/uL (ref 0.0–0.1)
Basophils Relative: 1 %
Eosinophils Absolute: 0 10*3/uL (ref 0.0–0.5)
Eosinophils Relative: 0 %
HCT: 31.4 % — ABNORMAL LOW (ref 36.0–46.0)
Hemoglobin: 11.1 g/dL — ABNORMAL LOW (ref 12.0–15.0)
Immature Granulocytes: 1 %
Lymphocytes Relative: 13 %
Lymphs Abs: 0.6 10*3/uL — ABNORMAL LOW (ref 0.7–4.0)
MCH: 32.6 pg (ref 26.0–34.0)
MCHC: 35.4 g/dL (ref 30.0–36.0)
MCV: 92.1 fL (ref 80.0–100.0)
Monocytes Absolute: 0.1 10*3/uL (ref 0.1–1.0)
Monocytes Relative: 2 %
Neutro Abs: 3.9 10*3/uL (ref 1.7–7.7)
Neutrophils Relative %: 83 %
Platelet Count: 157 10*3/uL (ref 150–400)
RBC: 3.41 MIL/uL — ABNORMAL LOW (ref 3.87–5.11)
RDW: 14.8 % (ref 11.5–15.5)
WBC Count: 4.7 10*3/uL (ref 4.0–10.5)
nRBC: 0 % (ref 0.0–0.2)

## 2021-01-16 MED ORDER — HEPARIN SOD (PORK) LOCK FLUSH 100 UNIT/ML IV SOLN
500.0000 [IU] | Freq: Once | INTRAVENOUS | Status: AC
  Administered 2021-01-16: 500 [IU]
  Filled 2021-01-16: qty 5

## 2021-01-16 MED ORDER — SODIUM CHLORIDE 0.9% FLUSH
10.0000 mL | Freq: Once | INTRAVENOUS | Status: AC
  Administered 2021-01-16: 10 mL
  Filled 2021-01-16: qty 10

## 2021-01-17 ENCOUNTER — Other Ambulatory Visit: Payer: Medicare Other

## 2021-01-17 ENCOUNTER — Ambulatory Visit: Payer: Medicare Other | Admitting: Internal Medicine

## 2021-01-17 ENCOUNTER — Ambulatory Visit: Payer: Medicare Other

## 2021-01-18 ENCOUNTER — Ambulatory Visit: Payer: Medicare Other

## 2021-01-19 ENCOUNTER — Ambulatory Visit: Payer: Medicare Other

## 2021-01-20 ENCOUNTER — Encounter: Payer: Self-pay | Admitting: Registered Nurse

## 2021-01-24 ENCOUNTER — Ambulatory Visit: Payer: Medicare Other | Admitting: Internal Medicine

## 2021-01-24 ENCOUNTER — Other Ambulatory Visit: Payer: Self-pay

## 2021-01-24 ENCOUNTER — Other Ambulatory Visit: Payer: Medicare Other

## 2021-01-24 ENCOUNTER — Other Ambulatory Visit: Payer: Self-pay | Admitting: Physician Assistant

## 2021-01-24 ENCOUNTER — Telehealth: Payer: Self-pay | Admitting: Physician Assistant

## 2021-01-24 ENCOUNTER — Inpatient Hospital Stay: Payer: Medicare Other

## 2021-01-24 DIAGNOSIS — Z5111 Encounter for antineoplastic chemotherapy: Secondary | ICD-10-CM | POA: Diagnosis not present

## 2021-01-24 DIAGNOSIS — Z95828 Presence of other vascular implants and grafts: Secondary | ICD-10-CM

## 2021-01-24 DIAGNOSIS — C3490 Malignant neoplasm of unspecified part of unspecified bronchus or lung: Secondary | ICD-10-CM

## 2021-01-24 DIAGNOSIS — C3491 Malignant neoplasm of unspecified part of right bronchus or lung: Secondary | ICD-10-CM

## 2021-01-24 DIAGNOSIS — C3411 Malignant neoplasm of upper lobe, right bronchus or lung: Secondary | ICD-10-CM | POA: Diagnosis not present

## 2021-01-24 DIAGNOSIS — E785 Hyperlipidemia, unspecified: Secondary | ICD-10-CM | POA: Diagnosis not present

## 2021-01-24 DIAGNOSIS — Z85828 Personal history of other malignant neoplasm of skin: Secondary | ICD-10-CM | POA: Diagnosis not present

## 2021-01-24 DIAGNOSIS — Z79899 Other long term (current) drug therapy: Secondary | ICD-10-CM | POA: Diagnosis not present

## 2021-01-24 LAB — CMP (CANCER CENTER ONLY)
ALT: 15 U/L (ref 0–44)
AST: 15 U/L (ref 15–41)
Albumin: 3.8 g/dL (ref 3.5–5.0)
Alkaline Phosphatase: 54 U/L (ref 38–126)
Anion gap: 11 (ref 5–15)
BUN: 9 mg/dL (ref 8–23)
CO2: 26 mmol/L (ref 22–32)
Calcium: 9.5 mg/dL (ref 8.9–10.3)
Chloride: 104 mmol/L (ref 98–111)
Creatinine: 0.75 mg/dL (ref 0.44–1.00)
GFR, Estimated: >60 mL/min (ref >60)
Glucose, Bld: 140 mg/dL — ABNORMAL HIGH (ref 70–99)
Potassium: 3.8 mmol/L (ref 3.5–5.1)
Sodium: 141 mmol/L (ref 135–145)
Total Bilirubin: 0.3 mg/dL (ref 0.3–1.2)
Total Protein: 6.6 g/dL (ref 6.5–8.1)

## 2021-01-24 LAB — CBC WITH DIFFERENTIAL (CANCER CENTER ONLY)
Abs Immature Granulocytes: 0 10*3/uL (ref 0.00–0.07)
Basophils Absolute: 0 10*3/uL (ref 0.0–0.1)
Basophils Relative: 0 %
Eosinophils Absolute: 0 10*3/uL (ref 0.0–0.5)
Eosinophils Relative: 2 %
HCT: 24.3 % — ABNORMAL LOW (ref 36.0–46.0)
Hemoglobin: 8.6 g/dL — ABNORMAL LOW (ref 12.0–15.0)
Immature Granulocytes: 0 %
Lymphocytes Relative: 33 %
Lymphs Abs: 0.4 10*3/uL — ABNORMAL LOW (ref 0.7–4.0)
MCH: 33.3 pg (ref 26.0–34.0)
MCHC: 35.4 g/dL (ref 30.0–36.0)
MCV: 94.2 fL (ref 80.0–100.0)
Monocytes Absolute: 0.2 10*3/uL (ref 0.1–1.0)
Monocytes Relative: 13 %
Neutro Abs: 0.7 10*3/uL — ABNORMAL LOW (ref 1.7–7.7)
Neutrophils Relative %: 52 %
Platelet Count: 106 10*3/uL — ABNORMAL LOW (ref 150–400)
RBC: 2.58 MIL/uL — ABNORMAL LOW (ref 3.87–5.11)
RDW: 14.9 % (ref 11.5–15.5)
WBC Count: 1.4 10*3/uL — ABNORMAL LOW (ref 4.0–10.5)
nRBC: 0 % (ref 0.0–0.2)

## 2021-01-24 MED ORDER — HEPARIN SOD (PORK) LOCK FLUSH 100 UNIT/ML IV SOLN
500.0000 [IU] | Freq: Once | INTRAVENOUS | Status: AC
Start: 2021-01-24 — End: 2021-01-24
  Administered 2021-01-24: 500 [IU]
  Filled 2021-01-24: qty 5

## 2021-01-24 MED ORDER — SODIUM CHLORIDE 0.9% FLUSH
10.0000 mL | Freq: Once | INTRAVENOUS | Status: AC
  Administered 2021-01-24: 10 mL
  Filled 2021-01-24: qty 10

## 2021-01-24 NOTE — Progress Notes (Signed)
I called the patient to let her know that I am arranging for zarxio injections daily x2 tomorrow and Thursday. I sent a scheduling message. She expressed understanding.

## 2021-01-24 NOTE — Telephone Encounter (Signed)
Scheduled appts per 5/31 sch msg. Pt aware.

## 2021-01-25 ENCOUNTER — Ambulatory Visit: Payer: Medicare Other

## 2021-01-25 ENCOUNTER — Inpatient Hospital Stay: Payer: Medicare Other | Attending: Physician Assistant

## 2021-01-25 DIAGNOSIS — Z5111 Encounter for antineoplastic chemotherapy: Secondary | ICD-10-CM | POA: Insufficient documentation

## 2021-01-25 DIAGNOSIS — Z923 Personal history of irradiation: Secondary | ICD-10-CM | POA: Diagnosis not present

## 2021-01-25 DIAGNOSIS — Z85828 Personal history of other malignant neoplasm of skin: Secondary | ICD-10-CM | POA: Insufficient documentation

## 2021-01-25 DIAGNOSIS — Z95828 Presence of other vascular implants and grafts: Secondary | ICD-10-CM

## 2021-01-25 DIAGNOSIS — C3491 Malignant neoplasm of unspecified part of right bronchus or lung: Secondary | ICD-10-CM

## 2021-01-25 DIAGNOSIS — Z86010 Personal history of colonic polyps: Secondary | ICD-10-CM | POA: Insufficient documentation

## 2021-01-25 DIAGNOSIS — E785 Hyperlipidemia, unspecified: Secondary | ICD-10-CM | POA: Insufficient documentation

## 2021-01-25 DIAGNOSIS — C3411 Malignant neoplasm of upper lobe, right bronchus or lung: Secondary | ICD-10-CM | POA: Diagnosis not present

## 2021-01-25 MED ORDER — FILGRASTIM-SNDZ 480 MCG/0.8ML IJ SOSY
480.0000 ug | PREFILLED_SYRINGE | Freq: Once | INTRAMUSCULAR | Status: AC
  Administered 2021-01-25: 480 ug via SUBCUTANEOUS

## 2021-01-25 MED ORDER — FILGRASTIM-SNDZ 480 MCG/0.8ML IJ SOSY
PREFILLED_SYRINGE | INTRAMUSCULAR | Status: AC
  Filled 2021-01-25: qty 0.8

## 2021-01-25 NOTE — Progress Notes (Signed)
[facility] OFFICE PROGRESS NOTE  Copland, Gay Filler, MD [address] [place] [address]  DIAGNOSIS:  Limitedstage Small Cell Lung Cancer. She presented with aright upper lobe suprahilar nodule/adenopathy, right paratracheal lymph node, and a hypermetabolic right upper lobe nodule.She was diagnosed in February2022.   PRIOR THERAPY: None   CURRENT THERAPY: Systemic chemotherapy with cisplatin 80 mg per metered squared on days 1, etoposide 100 mg per metered squared on days 1, 2, and 3 IV every 3 weeks with concurrent radiation. First dose on 11/15/2020. Status post 3 cycles  INTERVAL HISTORY: Susan Cortez 60 y.o. female returns to clinic today for follow-up visit.  The patient is feeling fatigued today. The patient tolerated her last cycle of treatment fairly well except for some fatigue and taste alterations. She lost a few pounds but she is trying to make sure she eats.  She also has some neutropenia which required Zarxio injections x2.  She denies any signs or symptoms of infection such as dysuria, skin infections, or sore throat.  Otherwise she denies any fever or chills. She has had some night sweats recently, especially around her head.  She denies any chest pain, shortness of breath, or hemoptysis.  She has a baseline productive cough.  She denies any nausea, vomiting, or diarrhea.   She uses a stool softener for constipation at baseline due to her pain medication. She occasionally has a headache.  Her staging brain MRI was negative for metastatic disease to the brain.  She follows up with radiation again on 03/06/2021.  She completed radiation a few weeks ago. She had one episode 1.5 weeks ago where she felt light headed after taking a shower.  The patient is here today for evaluation and repeat blood work before starting cycle #4   MEDICAL HISTORY: Past Medical History:  Diagnosis Date  . Allergy   . Arthritis   . Basal cell carcinoma 2018    Removed from Right neck per patient   . Diverticulosis 2013  . HLD (hyperlipidemia)   . Hx of adenomatous colonic polyps 2013  . OAB (overactive bladder)   . Pre-diabetes   . Small cell lung cancer ([facility]) 11/09/2020    ALLERGIES:  is allergic to lyrica [pregabalin], butrans [buprenorphine], chantix [varenicline], diclofenac, meloxicam, tolmetin, and nsaids.  MEDICATIONS:  Current Outpatient Medications  Medication Sig Dispense Refill  . acetaminophen-codeine (TYLENOL #4) 300-60 MG tablet Take 1 tablet by mouth daily. 30 tablet 2  . albuterol (VENTOLIN HFA) 108 (90 Base) MCG/ACT inhaler Inhale 2 puffs into the lungs every 6 (six) hours as needed for wheezing or shortness of breath. 8 g 6  . CALCIUM PO Take 650 mg by mouth daily.    . Cholecalciferol (VITAMIN D3) 125 MCG (5000 UT) CAPS Take 5,000 Units by mouth daily.    Marland Kitchen lidocaine-prilocaine (EMLA) cream Apply 1 application topically as needed. 30 g 0  . LORazepam (ATIVAN) 0.5 MG tablet Take 1 tablet (0.5 mg total) by mouth every 8 (eight) hours. 4 tablet 0  . lovastatin (MEVACOR) 20 MG tablet TAKE 1 TABLET(20 MG) BY MOUTH AT BEDTIME (Patient taking differently: Take 20 mg by mouth at bedtime.) 90 tablet 3  . mirabegron ER (MYRBETRIQ) 50 MG TB24 tablet Take 1 tablet (50 mg total) by mouth daily. 90 tablet 1  . prochlorperazine (COMPAZINE) 10 MG tablet Take 1 tablet (10 mg total) by mouth every 6 (six) hours as needed. 30 tablet 2   No  current facility-administered medications for this visit.    SURGICAL HISTORY:  Past Surgical History:  Procedure Laterality Date  . CESAREAN SECTION     X 3  . COLONOSCOPY  2013, 2016, 2019   hx colon polyps  . DILATION AND CURETTAGE OF UTERUS    . FUDUCIAL PLACEMENT Right 10/18/2020   Procedure: PLACEMENT OF FUDUCIAL;  Surgeon: Collene Gobble, MD;  Location: [facility];  Service: Thoracic;  Laterality: Right;  . IR IMAGING GUIDED PORT INSERTION  11/29/2020  . SHOULDER ARTHROSCOPY WITH OPEN ROTATOR CUFF  REPAIR Right 05/2009  . TONSILLECTOMY  1977  . TUBAL LIGATION    . VIDEO BRONCHOSCOPY WITH ENDOBRONCHIAL NAVIGATION N/A 10/18/2020   Procedure: VIDEO BRONCHOSCOPY WITH ENDOBRONCHIAL NAVIGATION;  Surgeon: Collene Gobble, MD;  Location: [facility];  Service: Thoracic;  Laterality: N/A;  . VIDEO BRONCHOSCOPY WITH ENDOBRONCHIAL ULTRASOUND N/A 10/18/2020   Procedure: VIDEO BRONCHOSCOPY WITH ENDOBRONCHIAL ULTRASOUND;  Surgeon: Collene Gobble, MD;  Location: [facility] OR;  Service: Thoracic;  Laterality: N/A;  . WISDOM TOOTH EXTRACTION      REVIEW OF SYSTEMS:   Review of Systems  Constitutional: Positive for fatigue, appetite change, and weight loss. Negative for  chills and fever.  HENT:   Negative for mouth sores, nosebleeds, sore throat and trouble swallowing.   Eyes: Negative for eye problems and icterus.  Respiratory: Positive for baseline cough. Negative for hemoptysis, shortness of breath and wheezing.   Cardiovascular: Negative for chest pain and leg swelling.  Gastrointestinal: Positive for baseline constipation. Negative for abdominal pain,  diarrhea, nausea and vomiting.  Genitourinary: Negative for bladder incontinence, difficulty urinating, dysuria, frequency and hematuria.   Musculoskeletal: Negative for back pain, gait problem, neck pain and neck stiffness.  Skin: Negative for itching and rash.  Neurological: Negative for dizziness, extremity weakness, gait problem, headaches, light-headedness and seizures.  Hematological: Negative for adenopathy. Does not bruise/bleed easily.  Psychiatric/Behavioral: Negative for confusion, depression and sleep disturbance. The patient is not nervous/anxious.     PHYSICAL EXAMINATION:  Blood pressure 133/77, pulse (!) 125, temperature 98.3 F (36.8 C), temperature source Oral, resp. rate 19, weight 213 lb (96.6 kg), SpO2 100 %.  ECOG PERFORMANCE STATUS: 1 - Symptomatic but completely ambulatory  Physical Exam  Constitutional: Oriented to person, place,  and time and well-developed, well-nourished, and in no distress.  HENT:  Head: Normocephalic and atraumatic.  Mouth/Throat: Oropharynx is clear and moist. No oropharyngeal exudate.  Eyes: Conjunctivae are normal. Right eye exhibits no discharge. Left eye exhibits no discharge. No scleral icterus.  Neck: Normal range of motion. Neck supple.  Cardiovascular: Tachycardic, regular rhythm, normal heart sounds and intact distal pulses.   Pulmonary/Chest: Effort normal and breath sounds normal. No respiratory distress. No wheezes. No rales.  Abdominal: Soft. Bowel sounds are normal. Exhibits no distension and no mass. There is no tenderness.  Musculoskeletal: Normal range of motion. Exhibits no edema.  Lymphadenopathy:    No cervical adenopathy.  Neurological: Alert and oriented to person, place, and time. Exhibits normal muscle tone. Gait normal. Coordination normal.  Skin: Skin is warm and dry. No rash noted. Not diaphoretic. No erythema. No pallor.  Psychiatric: Mood, memory and judgment normal.  Vitals reviewed.  LABORATORY DATA: Lab Results  Component Value Date   WBC 5.1 01/31/2021   HGB 9.7 (L) 01/31/2021   HCT 29.1 (L) 01/31/2021   MCV 97.3 01/31/2021   PLT 368 01/31/2021      Chemistry      Component Value  Date/Time   NA 139 01/31/2021 0747   K 3.8 01/31/2021 0747   CL 102 01/31/2021 0747   CO2 22 01/31/2021 0747   BUN 5 (L) 01/31/2021 0747   CREATININE 0.82 01/31/2021 0747   CREATININE 0.65 06/08/2020 1050      Component Value Date/Time   CALCIUM 9.7 01/31/2021 0747   ALKPHOS 61 01/31/2021 0747   AST 18 01/31/2021 0747   ALT 20 01/31/2021 0747   BILITOT <0.2 (L) 01/31/2021 0747       RADIOGRAPHIC STUDIES:  No results found.   ASSESSMENT/PLAN:  This is a very pleasant80 year old Caucasian female diagnosed withLimited stage (T3, N2, M0) small cell lung cancer presented with right upper lobe lung nodule, hypermetabolic nodule posteriorly in the right upper lobe  in addition to right paratracheal lymphadenopathy diagnosed in February 2022.  The patient is currently undergoing cisplatin 80 mg per metered squared on days 1, etoposide 100 mg/m on days 1, 2, and 3 IV every 3 weeks with concurrent radiation.She is status post 3 cycles and tolerated it well. She completed concurrent radiation.    Labs were reviewed. I reviewed her symptoms with Dr. Julien Nordmann. Recommend that she proceed with cycle #4 today as scheduled.   I will arrange for a restaging CT scan of the chest in 3 weeks before her next visit.   We will continue to monitor her labs closely weekly for neutropenia and will arrange for granix if needed.   She was encouraged to continue to stay hydrated. She was advised to check her vitals if she feels light headed.   Her pulse is a little fast today and is regular rhythm. We will recheck her pulse. She also is going to get IV hydration today.   Her taste alterations are likely due to her treatment. No evidence of thrush on exam.   The patient was advised to call immediately if she has any concerning symptoms in the interval. The patient voices understanding of current disease status and treatment options and is in agreement with the current care plan. All questions were answered. The patient knows to call the clinic with any problems, questions or concerns. We can certainly see the patient much sooner if necessary       Orders Placed This Encounter  Procedures  . CT Chest W Contrast    Standing Status:   Future    Standing Expiration Date:   01/31/2022    Order Specific Question:   If indicated for the ordered procedure, I authorize the administration of contrast media per Radiology protocol    Answer:   Yes    Order Specific Question:   Preferred imaging location?    Answer:   [facility]  . Magnesium    Standing Status:   Standing    Number of Occurrences:   2    Standing Expiration Date:   01/31/2022     I spent 20-29  minutes in this encounter.   Karin Griffith L Cela Newcom, PA-C 01/31/21

## 2021-01-26 ENCOUNTER — Inpatient Hospital Stay: Payer: Medicare Other

## 2021-01-26 ENCOUNTER — Other Ambulatory Visit: Payer: Self-pay

## 2021-01-26 ENCOUNTER — Ambulatory Visit: Payer: Medicare Other

## 2021-01-26 VITALS — BP 134/76 | HR 91 | Resp 18

## 2021-01-26 DIAGNOSIS — Z85828 Personal history of other malignant neoplasm of skin: Secondary | ICD-10-CM | POA: Diagnosis not present

## 2021-01-26 DIAGNOSIS — E785 Hyperlipidemia, unspecified: Secondary | ICD-10-CM | POA: Diagnosis not present

## 2021-01-26 DIAGNOSIS — C3411 Malignant neoplasm of upper lobe, right bronchus or lung: Secondary | ICD-10-CM | POA: Diagnosis not present

## 2021-01-26 DIAGNOSIS — Z86010 Personal history of colonic polyps: Secondary | ICD-10-CM | POA: Diagnosis not present

## 2021-01-26 DIAGNOSIS — Z923 Personal history of irradiation: Secondary | ICD-10-CM | POA: Diagnosis not present

## 2021-01-26 DIAGNOSIS — Z95828 Presence of other vascular implants and grafts: Secondary | ICD-10-CM

## 2021-01-26 DIAGNOSIS — Z5111 Encounter for antineoplastic chemotherapy: Secondary | ICD-10-CM | POA: Diagnosis not present

## 2021-01-26 DIAGNOSIS — C3491 Malignant neoplasm of unspecified part of right bronchus or lung: Secondary | ICD-10-CM

## 2021-01-26 MED ORDER — FILGRASTIM-SNDZ 480 MCG/0.8ML IJ SOSY
480.0000 ug | PREFILLED_SYRINGE | Freq: Once | INTRAMUSCULAR | Status: AC
  Administered 2021-01-26: 480 ug via SUBCUTANEOUS

## 2021-01-26 MED ORDER — FILGRASTIM-SNDZ 480 MCG/0.8ML IJ SOSY
PREFILLED_SYRINGE | INTRAMUSCULAR | Status: AC
  Filled 2021-01-26: qty 0.8

## 2021-01-26 NOTE — Patient Instructions (Signed)

## 2021-01-27 ENCOUNTER — Ambulatory Visit: Payer: Medicare Other

## 2021-01-31 ENCOUNTER — Inpatient Hospital Stay: Payer: Medicare Other

## 2021-01-31 ENCOUNTER — Other Ambulatory Visit: Payer: Self-pay

## 2021-01-31 ENCOUNTER — Inpatient Hospital Stay (HOSPITAL_BASED_OUTPATIENT_CLINIC_OR_DEPARTMENT_OTHER): Payer: Medicare Other | Admitting: Physician Assistant

## 2021-01-31 ENCOUNTER — Encounter: Payer: Self-pay | Admitting: Physician Assistant

## 2021-01-31 VITALS — BP 133/77 | HR 125 | Temp 98.3°F | Resp 19 | Wt 213.0 lb

## 2021-01-31 VITALS — HR 112

## 2021-01-31 DIAGNOSIS — Z5111 Encounter for antineoplastic chemotherapy: Secondary | ICD-10-CM | POA: Diagnosis not present

## 2021-01-31 DIAGNOSIS — C3491 Malignant neoplasm of unspecified part of right bronchus or lung: Secondary | ICD-10-CM

## 2021-01-31 DIAGNOSIS — Z923 Personal history of irradiation: Secondary | ICD-10-CM | POA: Diagnosis not present

## 2021-01-31 DIAGNOSIS — Z85828 Personal history of other malignant neoplasm of skin: Secondary | ICD-10-CM | POA: Diagnosis not present

## 2021-01-31 DIAGNOSIS — Z86010 Personal history of colonic polyps: Secondary | ICD-10-CM | POA: Diagnosis not present

## 2021-01-31 DIAGNOSIS — Z95828 Presence of other vascular implants and grafts: Secondary | ICD-10-CM

## 2021-01-31 DIAGNOSIS — C3490 Malignant neoplasm of unspecified part of unspecified bronchus or lung: Secondary | ICD-10-CM

## 2021-01-31 DIAGNOSIS — C3411 Malignant neoplasm of upper lobe, right bronchus or lung: Secondary | ICD-10-CM | POA: Diagnosis not present

## 2021-01-31 DIAGNOSIS — E785 Hyperlipidemia, unspecified: Secondary | ICD-10-CM | POA: Diagnosis not present

## 2021-01-31 LAB — CBC WITH DIFFERENTIAL (CANCER CENTER ONLY)
Abs Immature Granulocytes: 0.36 10*3/uL — ABNORMAL HIGH (ref 0.00–0.07)
Basophils Absolute: 0 10*3/uL (ref 0.0–0.1)
Basophils Relative: 1 %
Eosinophils Absolute: 0 10*3/uL (ref 0.0–0.5)
Eosinophils Relative: 0 %
HCT: 29.1 % — ABNORMAL LOW (ref 36.0–46.0)
Hemoglobin: 9.7 g/dL — ABNORMAL LOW (ref 12.0–15.0)
Immature Granulocytes: 7 %
Lymphocytes Relative: 14 %
Lymphs Abs: 0.7 10*3/uL (ref 0.7–4.0)
MCH: 32.4 pg (ref 26.0–34.0)
MCHC: 33.3 g/dL (ref 30.0–36.0)
MCV: 97.3 fL (ref 80.0–100.0)
Monocytes Absolute: 1.1 10*3/uL — ABNORMAL HIGH (ref 0.1–1.0)
Monocytes Relative: 21 %
Neutro Abs: 2.9 10*3/uL (ref 1.7–7.7)
Neutrophils Relative %: 57 %
Platelet Count: 368 10*3/uL (ref 150–400)
RBC: 2.99 MIL/uL — ABNORMAL LOW (ref 3.87–5.11)
RDW: 16.3 % — ABNORMAL HIGH (ref 11.5–15.5)
WBC Count: 5.1 10*3/uL (ref 4.0–10.5)
nRBC: 0 % (ref 0.0–0.2)

## 2021-01-31 LAB — CMP (CANCER CENTER ONLY)
ALT: 20 U/L (ref 0–44)
AST: 18 U/L (ref 15–41)
Albumin: 3.8 g/dL (ref 3.5–5.0)
Alkaline Phosphatase: 61 U/L (ref 38–126)
Anion gap: 15 (ref 5–15)
BUN: 5 mg/dL — ABNORMAL LOW (ref 8–23)
CO2: 22 mmol/L (ref 22–32)
Calcium: 9.7 mg/dL (ref 8.9–10.3)
Chloride: 102 mmol/L (ref 98–111)
Creatinine: 0.82 mg/dL (ref 0.44–1.00)
GFR, Estimated: >60 mL/min (ref >60)
Glucose, Bld: 135 mg/dL — ABNORMAL HIGH (ref 70–99)
Potassium: 3.8 mmol/L (ref 3.5–5.1)
Sodium: 139 mmol/L (ref 135–145)
Total Bilirubin: <0.2 mg/dL — ABNORMAL LOW (ref 0.3–1.2)
Total Protein: 6.7 g/dL (ref 6.5–8.1)

## 2021-01-31 LAB — SAMPLE TO BLOOD BANK

## 2021-01-31 MED ORDER — POTASSIUM CHLORIDE IN NACL 20-0.9 MEQ/L-% IV SOLN
Freq: Once | INTRAVENOUS | Status: AC
Start: 2021-01-31 — End: 2021-01-31
  Filled 2021-01-31: qty 1000

## 2021-01-31 MED ORDER — DEXAMETHASONE SODIUM PHOSPHATE 100 MG/10ML IJ SOLN
10.0000 mg | Freq: Once | INTRAMUSCULAR | Status: AC
  Administered 2021-01-31: 10 mg via INTRAVENOUS
  Filled 2021-01-31: qty 10

## 2021-01-31 MED ORDER — SODIUM CHLORIDE 0.9% FLUSH
10.0000 mL | Freq: Once | INTRAVENOUS | Status: AC
Start: 2021-01-31 — End: 2021-01-31
  Administered 2021-01-31: 10 mL
  Filled 2021-01-31: qty 10

## 2021-01-31 MED ORDER — PALONOSETRON HCL INJECTION 0.25 MG/5ML
INTRAVENOUS | Status: AC
  Filled 2021-01-31: qty 5

## 2021-01-31 MED ORDER — SODIUM CHLORIDE 0.9 % IV SOLN
80.0000 mg/m2 | Freq: Once | INTRAVENOUS | Status: AC
  Administered 2021-01-31: 174 mg via INTRAVENOUS
  Filled 2021-01-31: qty 174

## 2021-01-31 MED ORDER — MAGNESIUM SULFATE 2 GM/50ML IV SOLN
INTRAVENOUS | Status: AC
  Filled 2021-01-31: qty 50

## 2021-01-31 MED ORDER — SODIUM CHLORIDE 0.9% FLUSH
10.0000 mL | INTRAVENOUS | Status: DC | PRN
  Administered 2021-01-31: 10 mL
  Filled 2021-01-31: qty 10

## 2021-01-31 MED ORDER — MAGNESIUM SULFATE 2 GM/50ML IV SOLN
2.0000 g | Freq: Once | INTRAVENOUS | Status: AC
  Administered 2021-01-31: 2 g via INTRAVENOUS

## 2021-01-31 MED ORDER — PALONOSETRON HCL INJECTION 0.25 MG/5ML
0.2500 mg | Freq: Once | INTRAVENOUS | Status: AC
  Administered 2021-01-31: 0.25 mg via INTRAVENOUS

## 2021-01-31 MED ORDER — SODIUM CHLORIDE 0.9 % IV SOLN
100.0000 mg/m2 | Freq: Once | INTRAVENOUS | Status: AC
  Administered 2021-01-31: 220 mg via INTRAVENOUS
  Filled 2021-01-31: qty 11

## 2021-01-31 MED ORDER — HEPARIN SOD (PORK) LOCK FLUSH 100 UNIT/ML IV SOLN
500.0000 [IU] | Freq: Once | INTRAVENOUS | Status: AC | PRN
  Administered 2021-01-31: 500 [IU]
  Filled 2021-01-31: qty 5

## 2021-01-31 MED ORDER — SODIUM CHLORIDE 0.9 % IV SOLN
Freq: Once | INTRAVENOUS | Status: AC
  Filled 2021-01-31: qty 250

## 2021-01-31 MED ORDER — SODIUM CHLORIDE 0.9 % IV SOLN
150.0000 mg | Freq: Once | INTRAVENOUS | Status: AC
  Administered 2021-01-31: 150 mg via INTRAVENOUS
  Filled 2021-01-31: qty 150

## 2021-01-31 NOTE — Progress Notes (Signed)
Okay to proceed with heart rate per Cassie, PA-C for D1C4.

## 2021-01-31 NOTE — Patient Instructions (Signed)
[facility] ONCOLOG[facility]    Discharge Instructions:  Thank you for choosing [facility] to provide your oncology and hematology care.   If you have a lab appointment with the [facility], please go directly to the [facility] and check in at the registration area.   Wear comfortable clothing and clothing appropriate for easy access to any Portacath or PICC line.   We strive to give you quality time with your provider. You may need to reschedule your appointment if you arrive late (15 or more minutes).  Arriving late affects you and other patients whose appointments are after yours.  Also, if you miss three or more appointments without notifying the office, you may be dismissed from the clinic at the provider's discretion.      For prescription refill requests, have your pharmacy contact our office and allow 72 hours for refills to be completed.    Today you received the following chemotherapy and/or immunotherapy agents Cisplatin (PLATINOL) & Etoposide (VEPESID).   To help prevent nausea and vomiting after your treatment, we encourage you to take your nausea medication as directed.  BELOW ARE SYMPTOMS THAT SHOULD BE REPORTED IMMEDIATELY: . *FEVER GREATER THAN 100.4 F (38 C) OR HIGHER . *CHILLS OR SWEATING . *NAUSEA AND VOMITING THAT IS NOT CONTROLLED WITH YOUR NAUSEA MEDICATION . *UNUSUAL SHORTNESS OF BREATH . *UNUSUAL BRUISING OR BLEEDING . *URINARY PROBLEMS (pain or burning when urinating, or frequent urination) . *BOWEL PROBLEMS (unusual diarrhea, constipation, pain near the anus) . TENDERNESS IN MOUTH AND THROAT WITH OR WITHOUT PRESENCE OF ULCERS (sore throat, sores in mouth, or a toothache) . UNUSUAL RASH, SWELLING OR PAIN  . UNUSUAL VAGINAL DISCHARGE OR ITCHING   Items with * indicate a potential emergency and should be followed up as soon as possible or go to the Emergency Department if any problems should occur.  Please show the  CHEMOTHERAPY ALERT CARD or IMMUNOTHERAPY ALERT CARD at check-in to the Emergency Department and triage nurse.  Should you have questions after your visit or need to cancel or reschedule your appointment, please contact [facility]  Dept: [phone]  and follow the prompts.  Office hours are 8:00 a.m. to 4:30 p.m. Monday - Friday. Please note that voicemails left after 4:00 p.m. may not be returned until the following business day.  We are closed weekends and major holidays. You have access to a nurse at all times for urgent questions. Please call the main number to the clinic Dept: [phone] and follow the prompts.   For any non-urgent questions, you may also contact your provider using MyChart. We now offer e-Visits for anyone 75 and older to request care online for non-urgent symptoms. For details visit [email or web address].   Also download the MyChart app! Go to the app store, search "MyChart", open the app, select [facility], and log in with your MyChart username and password.  Due to Covid, a mask is required upon entering the hospital/clinic. If you do not have a mask, one will be given to you upon arrival. For doctor visits, patients may have 1 support person aged 66 or older with them. For treatment visits, patients cannot have anyone with them due to current Covid guidelines and our immunocompromised population.

## 2021-02-01 ENCOUNTER — Inpatient Hospital Stay: Payer: Medicare Other

## 2021-02-01 VITALS — BP 149/82 | HR 88 | Temp 98.3°F | Resp 18

## 2021-02-01 DIAGNOSIS — Z86010 Personal history of colonic polyps: Secondary | ICD-10-CM | POA: Diagnosis not present

## 2021-02-01 DIAGNOSIS — C3411 Malignant neoplasm of upper lobe, right bronchus or lung: Secondary | ICD-10-CM | POA: Diagnosis not present

## 2021-02-01 DIAGNOSIS — Z5111 Encounter for antineoplastic chemotherapy: Secondary | ICD-10-CM | POA: Diagnosis not present

## 2021-02-01 DIAGNOSIS — C3491 Malignant neoplasm of unspecified part of right bronchus or lung: Secondary | ICD-10-CM

## 2021-02-01 DIAGNOSIS — Z923 Personal history of irradiation: Secondary | ICD-10-CM | POA: Diagnosis not present

## 2021-02-01 DIAGNOSIS — E785 Hyperlipidemia, unspecified: Secondary | ICD-10-CM | POA: Diagnosis not present

## 2021-02-01 DIAGNOSIS — Z85828 Personal history of other malignant neoplasm of skin: Secondary | ICD-10-CM | POA: Diagnosis not present

## 2021-02-01 MED ORDER — HEPARIN SOD (PORK) LOCK FLUSH 100 UNIT/ML IV SOLN
500.0000 [IU] | Freq: Once | INTRAVENOUS | Status: AC | PRN
  Administered 2021-02-01: 500 [IU]
  Filled 2021-02-01: qty 5

## 2021-02-01 MED ORDER — SODIUM CHLORIDE 0.9% FLUSH
10.0000 mL | INTRAVENOUS | Status: DC | PRN
  Administered 2021-02-01: 10 mL
  Filled 2021-02-01: qty 10

## 2021-02-01 MED ORDER — SODIUM CHLORIDE 0.9 % IV SOLN
10.0000 mg | Freq: Once | INTRAVENOUS | Status: AC
  Administered 2021-02-01: 10 mg via INTRAVENOUS
  Filled 2021-02-01: qty 10

## 2021-02-01 MED ORDER — SODIUM CHLORIDE 0.9 % IV SOLN
Freq: Once | INTRAVENOUS | Status: AC
  Filled 2021-02-01: qty 250

## 2021-02-01 MED ORDER — SODIUM CHLORIDE 0.9 % IV SOLN
100.0000 mg/m2 | Freq: Once | INTRAVENOUS | Status: AC
  Administered 2021-02-01: 220 mg via INTRAVENOUS
  Filled 2021-02-01: qty 11

## 2021-02-01 NOTE — Patient Instructions (Signed)
Etoposide, VP-16 injection What is this medicine? ETOPOSIDE, VP-16 (e toe POE side) is a chemotherapy drug. It is used to treat testicular cancer, lung cancer, and other cancers. This medicine may be used for other purposes; ask your health care provider or pharmacist if you have questions. COMMON BRAND NAME(S): Etopophos, Toposar, VePesid What should I tell my health care provider before I take this medicine? They need to know if you have any of these conditions:  infection  kidney disease  liver disease  low blood counts, like low white cell, platelet, or red cell counts  an unusual or allergic reaction to etoposide, other medicines, foods, dyes, or preservatives  pregnant or trying to get pregnant  breast-feeding How should I use this medicine? This medicine is for infusion into a vein. It is administered in a hospital or clinic by a specially trained health care professional. Talk to your pediatrician regarding the use of this medicine in children. Special care may be needed. Overdosage: If you think you have taken too much of this medicine contact a poison control center or emergency room at once. NOTE: This medicine is only for you. Do not share this medicine with others. What if I miss a dose? It is important not to miss your dose. Call your doctor or health care professional if you are unable to keep an appointment. What may interact with this medicine? This medicine may interact with the following medications:  warfarin This list may not describe all possible interactions. Give your health care provider a list of all the medicines, herbs, non-prescription drugs, or dietary supplements you use. Also tell them if you smoke, drink alcohol, or use illegal drugs. Some items may interact with your medicine. What should I watch for while using this medicine? Visit your doctor for checks on your progress. This drug may make you feel generally unwell. This is not uncommon, as  chemotherapy can affect healthy cells as well as cancer cells. Report any side effects. Continue your course of treatment even though you feel ill unless your doctor tells you to stop. In some cases, you may be given additional medicines to help with side effects. Follow all directions for their use. Call your doctor or health care professional for advice if you get a fever, chills or sore throat, or other symptoms of a cold or flu. Do not treat yourself. This drug decreases your body's ability to fight infections. Try to avoid being around people who are sick. This medicine may increase your risk to bruise or bleed. Call your doctor or health care professional if you notice any unusual bleeding. Talk to your doctor about your risk of cancer. You may be more at risk for certain types of cancers if you take this medicine. Do not become pregnant while taking this medicine or for at least 6 months after stopping it. Women should inform their doctor if they wish to become pregnant or think they might be pregnant. Women of child-bearing potential will need to have a negative pregnancy test before starting this medicine. There is a potential for serious side effects to an unborn child. Talk to your health care professional or pharmacist for more information. Do not breast-feed an infant while taking this medicine. Men must use a latex condom during sexual contact with a woman while taking this medicine and for at least 4 months after stopping it. A latex condom is needed even if you have had a vasectomy. Contact your doctor right away if your partner  becomes pregnant. Do not donate sperm while taking this medicine and for at least 4 months after you stop taking this medicine. Men should inform their doctors if they wish to father a child. This medicine may lower sperm counts. What side effects may I notice from receiving this medicine? Side effects that you should report to your doctor or health care professional  as soon as possible:  allergic reactions like skin rash, itching or hives, swelling of the face, lips, or tongue  low blood counts - this medicine may decrease the number of white blood cells, red blood cells, and platelets. You may be at increased risk for infections and bleeding  nausea, vomiting  redness, blistering, peeling or loosening of the skin, including inside the mouth  signs and symptoms of infection like fever; chills; cough; sore throat; pain or trouble passing urine  signs and symptoms of low red blood cells or anemia such as unusually weak or tired; feeling faint or lightheaded; falls; breathing problems  unusual bruising or bleeding Side effects that usually do not require medical attention (report to your doctor or health care professional if they continue or are bothersome):  changes in taste  diarrhea  hair loss  loss of appetite  mouth sores This list may not describe all possible side effects. Call your doctor for medical advice about side effects. You may report side effects to FDA at 1-800-FDA-1088. Where should I keep my medicine? This drug is given in a hospital or clinic and will not be stored at home. NOTE: This sheet is a summary. It may not cover all possible information. If you have questions about this medicine, talk to your doctor, pharmacist, or health care provider.  2021 Elsevier/Gold Standard (2018-10-08 16:57:15)

## 2021-02-02 ENCOUNTER — Other Ambulatory Visit: Payer: Self-pay

## 2021-02-02 ENCOUNTER — Inpatient Hospital Stay: Payer: Medicare Other

## 2021-02-02 VITALS — BP 136/54 | HR 100 | Temp 98.2°F | Resp 18 | Wt 220.8 lb

## 2021-02-02 DIAGNOSIS — Z86010 Personal history of colonic polyps: Secondary | ICD-10-CM | POA: Diagnosis not present

## 2021-02-02 DIAGNOSIS — Z5111 Encounter for antineoplastic chemotherapy: Secondary | ICD-10-CM | POA: Diagnosis not present

## 2021-02-02 DIAGNOSIS — C3411 Malignant neoplasm of upper lobe, right bronchus or lung: Secondary | ICD-10-CM | POA: Diagnosis not present

## 2021-02-02 DIAGNOSIS — E785 Hyperlipidemia, unspecified: Secondary | ICD-10-CM | POA: Diagnosis not present

## 2021-02-02 DIAGNOSIS — C3491 Malignant neoplasm of unspecified part of right bronchus or lung: Secondary | ICD-10-CM

## 2021-02-02 DIAGNOSIS — Z923 Personal history of irradiation: Secondary | ICD-10-CM | POA: Diagnosis not present

## 2021-02-02 DIAGNOSIS — Z85828 Personal history of other malignant neoplasm of skin: Secondary | ICD-10-CM | POA: Diagnosis not present

## 2021-02-02 MED ORDER — SODIUM CHLORIDE 0.9 % IV SOLN
100.0000 mg/m2 | Freq: Once | INTRAVENOUS | Status: AC
  Administered 2021-02-02: 220 mg via INTRAVENOUS
  Filled 2021-02-02: qty 11

## 2021-02-02 MED ORDER — SODIUM CHLORIDE 0.9% FLUSH
10.0000 mL | INTRAVENOUS | Status: DC | PRN
  Administered 2021-02-02: 10 mL
  Filled 2021-02-02: qty 10

## 2021-02-02 MED ORDER — SODIUM CHLORIDE 0.9 % IV SOLN
10.0000 mg | Freq: Once | INTRAVENOUS | Status: AC
  Administered 2021-02-02: 10 mg via INTRAVENOUS
  Filled 2021-02-02: qty 10

## 2021-02-02 MED ORDER — SODIUM CHLORIDE 0.9 % IV SOLN
Freq: Once | INTRAVENOUS | Status: AC
  Filled 2021-02-02: qty 250

## 2021-02-02 MED ORDER — HEPARIN SOD (PORK) LOCK FLUSH 100 UNIT/ML IV SOLN
500.0000 [IU] | Freq: Once | INTRAVENOUS | Status: AC | PRN
  Administered 2021-02-02: 500 [IU]
  Filled 2021-02-02: qty 5

## 2021-02-02 NOTE — Patient Instructions (Signed)
[facility]  Discharge Instructions: Thank you for choosing [facility] to provide your oncology and hematology care.   If you have a lab appointment with the Cancer Center, please go directly to the Cancer Center and check in at the registration area.   Wear comfortable clothing and clothing appropriate for easy access to any Portacath or PICC line.   We strive to give you quality time with your provider. You may need to reschedule your appointment if you arrive late (15 or more minutes).  Arriving late affects you and other patients whose appointments are after yours.  Also, if you miss three or more appointments without notifying the office, you may be dismissed from the clinic at the provider's discretion.      For prescription refill requests, have your pharmacy contact our office and allow 72 hours for refills to be completed.    Today you received the following chemotherapy and/or immunotherapy agents: etoposide.     To help prevent nausea and vomiting after your treatment, we encourage you to take your nausea medication as directed.  BELOW ARE SYMPTOMS THAT SHOULD BE REPORTED IMMEDIATELY: . *FEVER GREATER THAN 100.4 F (38 C) OR HIGHER . *CHILLS OR SWEATING . *NAUSEA AND VOMITING THAT IS NOT CONTROLLED WITH YOUR NAUSEA MEDICATION . *UNUSUAL SHORTNESS OF BREATH . *UNUSUAL BRUISING OR BLEEDING . *URINARY PROBLEMS (pain or burning when urinating, or frequent urination) . *BOWEL PROBLEMS (unusual diarrhea, constipation, pain near the anus) . TENDERNESS IN MOUTH AND THROAT WITH OR WITHOUT PRESENCE OF ULCERS (sore throat, sores in mouth, or a toothache) . UNUSUAL RASH, SWELLING OR PAIN  . UNUSUAL VAGINAL DISCHARGE OR ITCHING   Items with * indicate a potential emergency and should be followed up as soon as possible or go to the Emergency Department if any problems should occur.  Please show the CHEMOTHERAPY ALERT CARD or IMMUNOTHERAPY ALERT  CARD at check-in to the Emergency Department and triage nurse.  Should you have questions after your visit or need to cancel or reschedule your appointment, please contact [facility]  Dept: [phone]  and follow the prompts.  Office hours are 8:00 a.m. to 4:30 p.m. Monday - Friday. Please note that voicemails left after 4:00 p.m. may not be returned until the following business day.  We are closed weekends and major holidays. You have access to a nurse at all times for urgent questions. Please call the main number to the clinic Dept: [phone] and follow the prompts.   For any non-urgent questions, you may also contact your provider using MyChart. We now offer e-Visits for anyone 18 and older to request care online for non-urgent symptoms. For details visit [email or web address].   Also download the MyChart app! Go to the app store, search "MyChart", open the app, select [facility], and log in with your MyChart username and password.  Due to Covid, a mask is required upon entering the hospital/clinic. If you do not have a mask, one will be given to you upon arrival. For doctor visits, patients may have 1 support person aged 18 or older with them. For treatment visits, patients cannot have anyone with them due to current Covid guidelines and our immunocompromised population.   

## 2021-02-16 ENCOUNTER — Inpatient Hospital Stay: Payer: Medicare Other

## 2021-02-16 ENCOUNTER — Ambulatory Visit (HOSPITAL_COMMUNITY)
Admission: RE | Admit: 2021-02-16 | Discharge: 2021-02-16 | Disposition: A | Discharge status: Home / Self Care | Payer: Medicare Other | Source: Ambulatory Visit | Attending: Physician Assistant | Admitting: Physician Assistant

## 2021-02-16 ENCOUNTER — Other Ambulatory Visit: Payer: Self-pay

## 2021-02-16 DIAGNOSIS — C3490 Malignant neoplasm of unspecified part of unspecified bronchus or lung: Secondary | ICD-10-CM | POA: Diagnosis not present

## 2021-02-16 DIAGNOSIS — Z5111 Encounter for antineoplastic chemotherapy: Secondary | ICD-10-CM | POA: Diagnosis not present

## 2021-02-16 DIAGNOSIS — C3491 Malignant neoplasm of unspecified part of right bronchus or lung: Secondary | ICD-10-CM | POA: Diagnosis not present

## 2021-02-16 DIAGNOSIS — Z85828 Personal history of other malignant neoplasm of skin: Secondary | ICD-10-CM | POA: Diagnosis not present

## 2021-02-16 DIAGNOSIS — I2510 Atherosclerotic heart disease of native coronary artery without angina pectoris: Secondary | ICD-10-CM | POA: Diagnosis not present

## 2021-02-16 DIAGNOSIS — Z923 Personal history of irradiation: Secondary | ICD-10-CM | POA: Diagnosis not present

## 2021-02-16 DIAGNOSIS — Z86010 Personal history of colonic polyps: Secondary | ICD-10-CM | POA: Diagnosis not present

## 2021-02-16 DIAGNOSIS — E785 Hyperlipidemia, unspecified: Secondary | ICD-10-CM | POA: Diagnosis not present

## 2021-02-16 DIAGNOSIS — Z95828 Presence of other vascular implants and grafts: Secondary | ICD-10-CM

## 2021-02-16 DIAGNOSIS — C3411 Malignant neoplasm of upper lobe, right bronchus or lung: Secondary | ICD-10-CM | POA: Diagnosis not present

## 2021-02-16 DIAGNOSIS — J929 Pleural plaque without asbestos: Secondary | ICD-10-CM | POA: Diagnosis not present

## 2021-02-16 DIAGNOSIS — J432 Centrilobular emphysema: Secondary | ICD-10-CM | POA: Diagnosis not present

## 2021-02-16 MED ORDER — SODIUM CHLORIDE 0.9% FLUSH
10.0000 mL | Freq: Once | INTRAVENOUS | Status: AC
  Administered 2021-02-16: 10 mL
  Filled 2021-02-16: qty 10

## 2021-02-16 MED ORDER — IOHEXOL 300 MG/ML  SOLN
75.0000 mL | Freq: Once | INTRAMUSCULAR | Status: AC | PRN
  Administered 2021-02-16: 75 mL via INTRAVENOUS

## 2021-02-16 MED ORDER — HEPARIN SOD (PORK) LOCK FLUSH 100 UNIT/ML IV SOLN
500.0000 [IU] | Freq: Once | INTRAVENOUS | Status: AC
  Administered 2021-02-16: 500 [IU] via INTRAVENOUS

## 2021-02-16 MED ORDER — SODIUM CHLORIDE (PF) 0.9 % IJ SOLN
INTRAMUSCULAR | Status: AC
  Filled 2021-02-16: qty 50

## 2021-02-20 NOTE — Progress Notes (Signed)
[facility] OFFICE PROGRESS NOTE  Copland, Gay Filler, MD [address] [place] [address]  DIAGNOSIS: Limited stage Small Cell Lung Cancer. She presented with a right upper lobe suprahilar nodule/adenopathy, right paratracheal lymph node, and a hypermetabolic right upper lobe nodule. She was diagnosed in February 2022.   PRIOR THERAPY: Systemic chemotherapy with cisplatin 80 mg per metered squared on days 1, etoposide 100 mg per metered squared on days 1, 2, and 3 IV every 3 weeks with concurrent radiation.  Last dose 02/01/21. Status post 4 cycles.   CURRENT THERAPY: Observation   INTERVAL HISTORY: Susan Cortez 60 y.o. female returns to the clinic today for a follow-up visit.  At the patient's last appointment on 01/31/2021.  The patient was to proceed with cycle #4 of chemotherapy.  The plan is to pursue 4-6 cycles of systemic chemotherapy total which will be determined based on her restaging CT scan which was performed a few days ago.  At the patient's last appointment, she was feeling fatigue and endorsing some taste alterations and weight loss.  She also received additional IV fluid for tachycardia.  Since her last appointment, the patient is feeling fatigued still.  For some reason, the patient did not have weekly labs performed. The patient denies any recent fever, chills, night sweats,.  Her weight is stable.  She denies any nausea, vomiting, or diarrhea.  She uses stool softeners for her baseline constipation due to her medication use pain medication use.  She denies headaches, seizures, double vision, or balance changes. She notes that reading her phone or TV is a little more blurry compared to prior. She has an appointment with her eye doctor next week.  She denies any chest pain or hemoptysis.  She reports her baseline dyspnea on exertion. She has a cough which is worse at night. She tried delsym without relief. She denies sick contacts. The patient recently  had a restaging CT scan performed.  She is here today for evaluation to review her scan results before determining whether she should proceed with cycle #5.    MEDICAL HISTORY: Past Medical History:  Diagnosis Date   Allergy    Arthritis    Basal cell carcinoma 2018   Removed from Right neck per patient    Diverticulosis 2013   HLD (hyperlipidemia)    Hx of adenomatous colonic polyps 2013   OAB (overactive bladder)    Pre-diabetes    Small cell lung cancer ([place]) 11/09/2020    ALLERGIES:  is allergic to lyrica [pregabalin], butrans [buprenorphine], chantix [varenicline], diclofenac, meloxicam, tolmetin, and nsaids.  MEDICATIONS:  Current Outpatient Medications  Medication Sig Dispense Refill   acetaminophen-codeine (TYLENOL #4) 300-60 MG tablet Take 1 tablet by mouth daily. 30 tablet 2   albuterol (VENTOLIN HFA) 108 (90 Base) MCG/ACT inhaler Inhale 2 puffs into the lungs every 6 (six) hours as needed for wheezing or shortness of breath. 8 g 6   azithromycin (ZITHROMAX Z-PAK) 250 MG tablet Take 2 tablets on day one, followed by 1 tablet daily 6 tablet 0   CALCIUM PO Take 650 mg by mouth daily.     Cholecalciferol (VITAMIN D3) 125 MCG (5000 UT) CAPS Take 5,000 Units by mouth daily.     lidocaine-prilocaine (EMLA) cream Apply 1 application topically as needed. 30 g 0   LORazepam (ATIVAN) 0.5 MG tablet Take 1 tablet (0.5 mg total) by mouth every 8 (eight) hours. 4 tablet 0   lovastatin (MEVACOR)  20 MG tablet TAKE 1 TABLET(20 MG) BY MOUTH AT BEDTIME (Patient taking differently: Take 20 mg by mouth at bedtime.) 90 tablet 3   mirabegron ER (MYRBETRIQ) 50 MG TB24 tablet Take 1 tablet (50 mg total) by mouth daily. 90 tablet 1   prochlorperazine (COMPAZINE) 10 MG tablet Take 1 tablet (10 mg total) by mouth every 6 (six) hours as needed. 30 tablet 2   No current facility-administered medications for this visit.    SURGICAL HISTORY:  Past Surgical History:  Procedure Laterality Date    CESAREAN SECTION     X 3   COLONOSCOPY  2013, 2016, 2019   hx colon polyps   DILATION AND CURETTAGE OF UTERUS     FUDUCIAL PLACEMENT Right 10/18/2020   Procedure: PLACEMENT OF FUDUCIAL;  Surgeon: Collene Gobble, MD;  Location: [facility] OR;  Service: Thoracic;  Laterality: Right;   IR IMAGING GUIDED PORT INSERTION  11/29/2020   SHOULDER ARTHROSCOPY WITH OPEN ROTATOR CUFF REPAIR Right 05/2009   TONSILLECTOMY  1977   TUBAL LIGATION     VIDEO BRONCHOSCOPY WITH ENDOBRONCHIAL NAVIGATION N/A 10/18/2020   Procedure: VIDEO BRONCHOSCOPY WITH ENDOBRONCHIAL NAVIGATION;  Surgeon: Collene Gobble, MD;  Location: [facility] OR;  Service: Thoracic;  Laterality: N/A;   VIDEO BRONCHOSCOPY WITH ENDOBRONCHIAL ULTRASOUND N/A 10/18/2020   Procedure: VIDEO BRONCHOSCOPY WITH ENDOBRONCHIAL ULTRASOUND;  Surgeon: Collene Gobble, MD;  Location: [facility] OR;  Service: Thoracic;  Laterality: N/A;   WISDOM TOOTH EXTRACTION      REVIEW OF SYSTEMS:   Constitutional: Positive for fatigue. Negative for  chills and fever.  HENT:   Negative for mouth sores, nosebleeds, sore throat and trouble swallowing.   Eyes: Negative for eye problems and icterus. Respiratory: Positive for cough and baseline dyspnea on exertion. Negative for hemoptysis and wheezing.   Cardiovascular: Negative for chest pain and leg swelling. Gastrointestinal: Positive for baseline constipation. Negative for abdominal pain,  diarrhea, nausea and vomiting. Genitourinary: Negative for bladder incontinence, difficulty urinating, dysuria, frequency and hematuria.   Musculoskeletal: Negative for back pain, gait problem, neck pain and neck stiffness. Skin: Negative for itching and rash. Neurological: Negative for dizziness, extremity weakness, gait problem, headaches, light-headedness and seizures. Hematological: Negative for adenopathy. Does not bruise/bleed easily. Psychiatric/Behavioral: Negative for confusion, depression and sleep disturbance. The patient is not nervous/anxious.   confusion, depression and sleep disturbance. The patient is not nervous/anxious.     PHYSICAL EXAMINATION:  Blood pressure 139/81, pulse (!) 112, temperature 98.4 F (36.9 C), temperature source Oral, resp. rate 19, weight 214 lb 11.2 oz (97.4 kg), SpO2 99 %.  ECOG PERFORMANCE STATUS: 1  Physical Exam  Constitutional: Oriented to person, place, and time and well-developed, well-nourished, and in no distress. HENT: Head: Normocephalic and atraumatic. Mouth/Throat: Oropharynx is clear and moist. No oropharyngeal exudate. Eyes: Conjunctivae are normal. Right eye exhibits no discharge. Left eye exhibits no discharge. No scleral icterus. Neck: Normal range of motion. Neck supple. Cardiovascular: Tachycardic, regular rhythm, normal heart sounds and intact distal pulses.   Pulmonary/Chest: Effort normal and breath sounds normal. No respiratory distress. No wheezes. No rales. Abdominal: Soft. Bowel sounds are normal. Exhibits no distension and no mass. There is no tenderness.  Musculoskeletal: Normal range of motion. Exhibits no edema.  Lymphadenopathy:    No cervical adenopathy.  Neurological: Alert and oriented to person, place, and time. Exhibits normal muscle tone. Gait normal. Coordination normal. Skin: Skin is warm and dry. No rash noted. Not diaphoretic. No erythema. No pallor.  Psychiatric:  Mood, memory and judgment normal. Vitals reviewed.  LABORATORY DATA: Lab Results  Component Value Date   WBC 3.4 (L) 02/21/2021   HGB 9.0 (L) 02/21/2021   HCT 26.9 (L) 02/21/2021   MCV 100.7 (H) 02/21/2021   PLT 426 (H) 02/21/2021      Chemistry      Component Value Date/Time   NA 139 02/21/2021 0835   K 4.6 02/21/2021 0835   CL 103 02/21/2021 0835   CO2 24 02/21/2021 0835   BUN 7 (L) 02/21/2021 0835   CREATININE 0.89 02/21/2021 0835   CREATININE 0.65 06/08/2020 1050      Component Value Date/Time   CALCIUM 9.5 02/21/2021 0835   ALKPHOS 47 02/21/2021 0835   AST 16 02/21/2021  0835   ALT 13 02/21/2021 0835   BILITOT 0.2 (L) 02/21/2021 0835       RADIOGRAPHIC STUDIES:  CT Chest W Contrast  Result Date: 02/17/2021 CLINICAL DATA:  Small-cell lung cancer restaging EXAM: CT CHEST WITH CONTRAST TECHNIQUE: Multidetector CT imaging of the chest was performed during intravenous contrast administration. CONTRAST:  10mL OMNIPAQUE IOHEXOL 300 MG/ML  SOLN COMPARISON:  PET-CT, 11/08/2020 FINDINGS: Cardiovascular: Right chest port catheter. Normal heart size. Scattered left and right coronary artery calcifications. No pericardial effusion. Mediastinum/Nodes: Interval resolution of pretracheal lymphadenopathy, largest node measuring 1.0 x 0.5 cm, previously 3.0 x 2.0 cm (series 2, image 53). Interval resolution of a previously identified hypermetabolic right suprahilar nodule or lymph node, residual right hilar lymph nodes measuring no greater than 1.3 x 1.2 cm (series 2, image 63). No persistently enlarged mediastinal, hilar, or axillary lymph nodes. Thyroid gland, trachea, and esophagus demonstrate no significant findings. Lungs/Pleura: Mild centrilobular and paraseptal emphysema. Diffuse bilateral bronchial wall thickening. Previously noted hypermetabolic nodule in the posterior right upper lobe is almost completely resolved, residual nodule measuring no greater than 4 mm (series 5, image 59). No pleural effusion or pneumothorax. Upper Abdomen: No acute abnormality. Musculoskeletal: No chest wall mass or suspicious bone lesions identified. IMPRESSION: 1. Previously noted hypermetabolic nodule in the posterior right upper lobe is almost completely resolved, residual nodule measuring no greater than 4 mm. 2. Interval resolution of hypermetabolic right suprahilar nodule or lymph node, as well as pretracheal lymphadenopathy. 3. Findings are consistent with treatment response. 4. Mild emphysema and diffuse bilateral bronchial wall thickening, consistent with nonspecific infectious or  inflammatory bronchitis. 5. Coronary artery disease. Emphysema (ICD10-J43.9). Electronically Signed   By: Eddie Candle M.D.   On: 02/17/2021 08:38     ASSESSMENT/PLAN:  This is a very pleasant 60 year old Caucasian female diagnosed with Limited stage (T3, N2, M0) small cell lung cancer presented with right upper lobe lung nodule, hypermetabolic nodule posteriorly in the right upper lobe in addition to right paratracheal lymphadenopathy diagnosed in February 2022.   The patient is currently undergoing cisplatin 80 mg per metered squared on days 1, etoposide 100 mg/m on days 1, 2, and 3 IV every 3 weeks with concurrent radiation. She is status post 4 cycles and tolerated it well. She completed concurrent radiation.  The patient was seen with Dr. Julien Nordmann.  Dr. Julien Nordmann personally and independently reviewed restaging CT scan.  Her CT scan showed a positive response to treatment with the posterior right upper lobe is almost completely resolved, residual nodule measuring no greater than 4 mm. There was also a interval resolution of hypermetabolic right suprahilar nodule or lymph node, as well as pretracheal lymphadenopathy. She has some nonspecific infectious or inflammatory bronchitis.   Dr.  Mohamed recommend that she continue on observation with a restaging CT scan in 3 months.  We will see her back for follow-up visit in 3 months for evaluation and to review her restaging CT scan results.  We will refer the patient to radiation oncology for consideration of PCI.  Dr. Julien Nordmann will defer the decision for repeat brain imaging for her blurry vision to radiation oncology and/or after she has her eye exam next week.   We will send her a z-pak for her cough.   The patient was advised to call immediately if she has any concerning symptoms in the interval. The patient voices understanding of current disease status and treatment options and is in agreement with the current care plan. All questions were  answered. The patient knows to call the clinic with any problems, questions or concerns. We can certainly see the patient much sooner if necessary       Orders Placed This Encounter  Procedures   CT Chest W Contrast    Standing Status:   Future    Standing Expiration Date:   02/21/2022    Order Specific Question:   If indicated for the ordered procedure, I authorize the administration of contrast media per Radiology protocol    Answer:   Yes    Order Specific Question:   Preferred imaging location?    Answer:   [facility]   CBC with Differential ([place] Only)    Standing Status:   Future    Standing Expiration Date:   02/21/2022   CMP ([place] only)    Standing Status:   Future    Standing Expiration Date:   02/21/2022      Susan Sos Rainen Vanrossum, PA-C 02/21/21  ADDENDUM: Hematology/Oncology Attending: I had a face-to-face encounter with the patient today.  I reviewed her records, lab and scan and recommended her care plan.  This is a very pleasant 60 years old white female diagnosed with limited stage (T3, N2, M0) small cell lung cancer presented with right upper lobe lung nodule in addition to hypermetabolic nodule posterior in the right upper lobe as well as right paratracheal lymphadenopathy diagnosed in February 2022 status post systemic chemotherapy with cisplatin 80 Mg/M2 on day 1 and etoposide 100 Mg/M2 on days 1, 2 and 3 every 3 weeks, status post 4 cycles.  This was concurrent with radiotherapy. The patient tolerated her systemic chemotherapy fairly well with no concerning complaints. She had repeat CT scan of the chest performed recently.  I personally and independently reviewed the scan images and discussed the results with the patient today. Her scan showed continuous improvement of her disease. I recommended for the patient to see Dr. Lisbeth Renshaw for consideration of prophylactic cranial irradiation after the good response to the chemotherapy and  concurrent radiation. We will continue to monitor her closely with repeat CT scan of the chest in 3 months for restaging of her disease. For the persistent cough and suspicious inflammatory process in the lung, we will start the patient on Z-Pak. The patient was advised to call immediately if she has any other concerning symptoms in the interval.  The total time spent in the appointment was 30 minutes. Disclaimer: This note was dictated with voice recognition software. Similar sounding words can inadvertently be transcribed and may be missed upon review. Eilleen Kempf, MD 02/21/21

## 2021-02-21 ENCOUNTER — Other Ambulatory Visit: Payer: Self-pay | Admitting: Physician Assistant

## 2021-02-21 ENCOUNTER — Telehealth: Payer: Self-pay | Admitting: Radiation Oncology

## 2021-02-21 ENCOUNTER — Encounter: Payer: Self-pay | Admitting: Physician Assistant

## 2021-02-21 ENCOUNTER — Inpatient Hospital Stay: Payer: Medicare Other

## 2021-02-21 ENCOUNTER — Other Ambulatory Visit: Payer: Self-pay

## 2021-02-21 ENCOUNTER — Inpatient Hospital Stay (HOSPITAL_BASED_OUTPATIENT_CLINIC_OR_DEPARTMENT_OTHER): Payer: Medicare Other | Admitting: Physician Assistant

## 2021-02-21 VITALS — BP 139/81 | HR 112 | Temp 98.4°F | Resp 19 | Wt 214.7 lb

## 2021-02-21 DIAGNOSIS — C3491 Malignant neoplasm of unspecified part of right bronchus or lung: Secondary | ICD-10-CM

## 2021-02-21 DIAGNOSIS — Z85828 Personal history of other malignant neoplasm of skin: Secondary | ICD-10-CM | POA: Diagnosis not present

## 2021-02-21 DIAGNOSIS — C3490 Malignant neoplasm of unspecified part of unspecified bronchus or lung: Secondary | ICD-10-CM

## 2021-02-21 DIAGNOSIS — F40240 Claustrophobia: Secondary | ICD-10-CM

## 2021-02-21 DIAGNOSIS — E785 Hyperlipidemia, unspecified: Secondary | ICD-10-CM | POA: Diagnosis not present

## 2021-02-21 DIAGNOSIS — Z923 Personal history of irradiation: Secondary | ICD-10-CM | POA: Diagnosis not present

## 2021-02-21 DIAGNOSIS — Z86010 Personal history of colonic polyps: Secondary | ICD-10-CM | POA: Diagnosis not present

## 2021-02-21 DIAGNOSIS — Z95828 Presence of other vascular implants and grafts: Secondary | ICD-10-CM

## 2021-02-21 DIAGNOSIS — C3411 Malignant neoplasm of upper lobe, right bronchus or lung: Secondary | ICD-10-CM | POA: Diagnosis not present

## 2021-02-21 DIAGNOSIS — Z5111 Encounter for antineoplastic chemotherapy: Secondary | ICD-10-CM | POA: Diagnosis not present

## 2021-02-21 LAB — CBC WITH DIFFERENTIAL (CANCER CENTER ONLY)
Abs Immature Granulocytes: 0.08 10*3/uL — ABNORMAL HIGH (ref 0.00–0.07)
Basophils Absolute: 0.1 10*3/uL (ref 0.0–0.1)
Basophils Relative: 2 %
Eosinophils Absolute: 0 10*3/uL (ref 0.0–0.5)
Eosinophils Relative: 1 %
HCT: 26.9 % — ABNORMAL LOW (ref 36.0–46.0)
Hemoglobin: 9 g/dL — ABNORMAL LOW (ref 12.0–15.0)
Immature Granulocytes: 2 %
Lymphocytes Relative: 22 %
Lymphs Abs: 0.7 10*3/uL (ref 0.7–4.0)
MCH: 33.7 pg (ref 26.0–34.0)
MCHC: 33.5 g/dL (ref 30.0–36.0)
MCV: 100.7 fL — ABNORMAL HIGH (ref 80.0–100.0)
Monocytes Absolute: 0.9 10*3/uL (ref 0.1–1.0)
Monocytes Relative: 28 %
Neutro Abs: 1.5 10*3/uL — ABNORMAL LOW (ref 1.7–7.7)
Neutrophils Relative %: 45 %
Platelet Count: 426 10*3/uL — ABNORMAL HIGH (ref 150–400)
RBC: 2.67 MIL/uL — ABNORMAL LOW (ref 3.87–5.11)
RDW: 17.7 % — ABNORMAL HIGH (ref 11.5–15.5)
WBC Count: 3.4 10*3/uL — ABNORMAL LOW (ref 4.0–10.5)
nRBC: 0 % (ref 0.0–0.2)

## 2021-02-21 LAB — CMP (CANCER CENTER ONLY)
ALT: 13 U/L (ref 0–44)
AST: 16 U/L (ref 15–41)
Albumin: 3.8 g/dL (ref 3.5–5.0)
Alkaline Phosphatase: 47 U/L (ref 38–126)
Anion gap: 12 (ref 5–15)
BUN: 7 mg/dL — ABNORMAL LOW (ref 8–23)
CO2: 24 mmol/L (ref 22–32)
Calcium: 9.5 mg/dL (ref 8.9–10.3)
Chloride: 103 mmol/L (ref 98–111)
Creatinine: 0.89 mg/dL (ref 0.44–1.00)
GFR, Estimated: >60 mL/min (ref >60)
Glucose, Bld: 125 mg/dL — ABNORMAL HIGH (ref 70–99)
Potassium: 4.6 mmol/L (ref 3.5–5.1)
Sodium: 139 mmol/L (ref 135–145)
Total Bilirubin: 0.2 mg/dL — ABNORMAL LOW (ref 0.3–1.2)
Total Protein: 6.7 g/dL (ref 6.5–8.1)

## 2021-02-21 LAB — MAGNESIUM: Magnesium: 1.9 mg/dL (ref 1.7–2.4)

## 2021-02-21 MED ORDER — SODIUM CHLORIDE 0.9% FLUSH
10.0000 mL | Freq: Once | INTRAVENOUS | Status: AC
  Administered 2021-02-21: 10 mL
  Filled 2021-02-21: qty 10

## 2021-02-21 MED ORDER — HEPARIN SOD (PORK) LOCK FLUSH 100 UNIT/ML IV SOLN
500.0000 [IU] | Freq: Once | INTRAVENOUS | Status: AC
Start: 2021-02-21 — End: 2021-02-21
  Administered 2021-02-21: 500 [IU]
  Filled 2021-02-21: qty 5

## 2021-02-21 MED ORDER — AZITHROMYCIN 250 MG PO TABS: ORAL_TABLET | ORAL | 0 refills | Status: DC

## 2021-02-22 ENCOUNTER — Ambulatory Visit: Payer: Medicare Other

## 2021-02-22 ENCOUNTER — Encounter: Payer: Self-pay | Admitting: Internal Medicine

## 2021-02-22 MED ORDER — LORAZEPAM 1 MG PO TABS: 0.5000 mg | ORAL_TABLET | Freq: Three times a day (TID) | ORAL | 0 refills | Status: DC

## 2021-02-22 MED ORDER — MEMANTINE HCL 5 MG PO TABS: ORAL_TABLET | ORAL | 0 refills | Status: DC

## 2021-02-22 MED ORDER — MEMANTINE HCL 10 MG PO TABS: 10.0000 mg | ORAL_TABLET | Freq: Two times a day (BID) | ORAL | 4 refills | Status: DC

## 2021-02-22 NOTE — Telephone Encounter (Signed)
I called and spoke with the patient to review the rationale for prophylactic cranial irradiation now that she's completed systemic therapy.  We discussed the risks, benefits, short, and long term effects of radiotherapy, as well as the curative intent, and the patient is interested in proceeding. We would like to coordinate an MRI of the brain first to rule out disease prior to treatment. We also reviewed her claustrophobia and a new rx for Ativan will be sent in for her to use prior to MRI, simulation and if needed for treatment. Dr. Lisbeth Renshaw plans on 2 weeks of radiotherapy to the whole brain and as there are concerns for long term cognitive deficits we reviewed the protocol of using namenda per NCCN guidelines in order to avoid long term cognitive deficits. The patient is interested after we discussed the course and side effect profile. A new rx was also sent to her pharmacy for the month of titration and continuation to total 6 months of namenda. I will coordinate simulation once we know the date of her brain MRI.

## 2021-02-23 ENCOUNTER — Ambulatory Visit: Payer: Medicare Other

## 2021-02-23 ENCOUNTER — Telehealth: Payer: Self-pay | Admitting: Internal Medicine

## 2021-02-23 NOTE — Progress Notes (Signed)
  Radiation Oncology         [phone] ________________________________  Name: Susan Cortez MRN: [id number]  Date of Service: 03/06/2021  DOB: 09/30/1960  Post Treatment Telephone Note  Diagnosis:   Limited Stage Small Cell Carcinoma of the RUL.   Interval Since Last Radiation:  9 weeks   11/21/2020 through 01/04/2021 Site Technique Total Dose (Gy) Dose per Fx (Gy) Completed Fx Beam Energies  Lung, Right: Lung_Rt 3D 60/60 2 30/30 6X, 10X  Lung, Right: Lung_Rt_Bst 3D 6/6 2 3/3 6X, 10X    Narrative:  The patient was contacted today for routine follow-up. During treatment she did very well with radiotherapy and did not have significant desquamation. She reports she is doing well.  Impression/Plan: 1. Limited Stage Small Cell Carcinoma of the RUL.The patient has been doing well since completion of radiotherapy. We discussed that we would plan for brain MRI tomorrow and simulation later this week for PCI. She will begin surveillance with Dr. Julien Nordmann as well.       Carola Rhine, PAC

## 2021-02-23 NOTE — Telephone Encounter (Signed)
Scheduled appt per 6/29 sch msg. Pt aware.

## 2021-02-24 ENCOUNTER — Ambulatory Visit: Payer: Medicare Other

## 2021-02-24 ENCOUNTER — Encounter: Payer: Self-pay | Admitting: Internal Medicine

## 2021-02-28 ENCOUNTER — Encounter (HOSPITAL_BASED_OUTPATIENT_CLINIC_OR_DEPARTMENT_OTHER): Payer: Self-pay

## 2021-02-28 ENCOUNTER — Ambulatory Visit (HOSPITAL_BASED_OUTPATIENT_CLINIC_OR_DEPARTMENT_OTHER)
Admission: RE | Admit: 2021-02-28 | Discharge: 2021-02-28 | Disposition: A | Discharge status: Home / Self Care | Payer: Medicare Other | Source: Ambulatory Visit | Attending: Family Medicine | Admitting: Family Medicine

## 2021-02-28 ENCOUNTER — Other Ambulatory Visit: Payer: Self-pay

## 2021-02-28 DIAGNOSIS — Z1231 Encounter for screening mammogram for malignant neoplasm of breast: Secondary | ICD-10-CM | POA: Diagnosis not present

## 2021-03-02 DIAGNOSIS — H524 Presbyopia: Secondary | ICD-10-CM | POA: Diagnosis not present

## 2021-03-02 DIAGNOSIS — H3563 Retinal hemorrhage, bilateral: Secondary | ICD-10-CM | POA: Diagnosis not present

## 2021-03-03 ENCOUNTER — Telehealth: Payer: Self-pay | Admitting: Family

## 2021-03-03 NOTE — Telephone Encounter (Signed)
Error- lw

## 2021-03-06 ENCOUNTER — Ambulatory Visit
Admission: RE | Admit: 2021-03-06 | Discharge: 2021-03-06 | Disposition: A | Discharge status: Home / Self Care | Payer: Medicare Other | Source: Ambulatory Visit | Attending: Radiation Oncology | Admitting: Radiation Oncology

## 2021-03-06 DIAGNOSIS — C3491 Malignant neoplasm of unspecified part of right bronchus or lung: Secondary | ICD-10-CM

## 2021-03-06 DIAGNOSIS — C3411 Malignant neoplasm of upper lobe, right bronchus or lung: Secondary | ICD-10-CM | POA: Insufficient documentation

## 2021-03-06 DIAGNOSIS — Z298 Encounter for other specified prophylactic measures: Secondary | ICD-10-CM | POA: Insufficient documentation

## 2021-03-06 DIAGNOSIS — Z510 Encounter for antineoplastic radiation therapy: Secondary | ICD-10-CM | POA: Insufficient documentation

## 2021-03-07 ENCOUNTER — Ambulatory Visit (HOSPITAL_COMMUNITY)
Admission: RE | Admit: 2021-03-07 | Discharge: 2021-03-07 | Disposition: A | Discharge status: Home / Self Care | Payer: Medicare Other | Source: Ambulatory Visit | Attending: Radiation Oncology | Admitting: Radiation Oncology

## 2021-03-07 ENCOUNTER — Other Ambulatory Visit: Payer: Self-pay

## 2021-03-07 ENCOUNTER — Inpatient Hospital Stay: Payer: Medicare Other | Attending: Physician Assistant

## 2021-03-07 DIAGNOSIS — Z452 Encounter for adjustment and management of vascular access device: Secondary | ICD-10-CM | POA: Insufficient documentation

## 2021-03-07 DIAGNOSIS — C729 Malignant neoplasm of central nervous system, unspecified: Secondary | ICD-10-CM | POA: Diagnosis not present

## 2021-03-07 DIAGNOSIS — C3491 Malignant neoplasm of unspecified part of right bronchus or lung: Secondary | ICD-10-CM | POA: Diagnosis not present

## 2021-03-07 DIAGNOSIS — Z95828 Presence of other vascular implants and grafts: Secondary | ICD-10-CM

## 2021-03-07 DIAGNOSIS — F40240 Claustrophobia: Secondary | ICD-10-CM | POA: Insufficient documentation

## 2021-03-07 DIAGNOSIS — C3411 Malignant neoplasm of upper lobe, right bronchus or lung: Secondary | ICD-10-CM | POA: Diagnosis not present

## 2021-03-07 DIAGNOSIS — G9389 Other specified disorders of brain: Secondary | ICD-10-CM | POA: Diagnosis not present

## 2021-03-07 MED ORDER — HEPARIN SOD (PORK) LOCK FLUSH 100 UNIT/ML IV SOLN
500.0000 [IU] | Freq: Once | INTRAVENOUS | Status: DC
  Filled 2021-03-07: qty 5

## 2021-03-07 MED ORDER — HEPARIN SOD (PORK) LOCK FLUSH 100 UNIT/ML IV SOLN: 500.0000 [IU] | Freq: Once | INTRAVENOUS | Status: DC

## 2021-03-07 MED ORDER — SODIUM CHLORIDE 0.9% FLUSH
10.0000 mL | Freq: Once | INTRAVENOUS | Status: AC
  Administered 2021-03-07: 10 mL
  Filled 2021-03-07: qty 10

## 2021-03-07 MED ORDER — GADOBUTROL 1 MMOL/ML IV SOLN
10.0000 mL | Freq: Once | INTRAVENOUS | Status: AC | PRN
  Administered 2021-03-07: 10 mL via INTRAVENOUS

## 2021-03-07 NOTE — Progress Notes (Signed)
Pt stated she was told to be left accessed for MRI that is scheduled today.

## 2021-03-08 ENCOUNTER — Encounter: Payer: Self-pay | Admitting: Radiation Oncology

## 2021-03-08 ENCOUNTER — Other Ambulatory Visit: Payer: Self-pay

## 2021-03-08 ENCOUNTER — Ambulatory Visit
Admission: RE | Admit: 2021-03-08 | Discharge: 2021-03-08 | Disposition: A | Discharge status: Home / Self Care | Payer: Medicare Other | Source: Ambulatory Visit | Attending: Radiation Oncology | Admitting: Radiation Oncology

## 2021-03-08 DIAGNOSIS — C3411 Malignant neoplasm of upper lobe, right bronchus or lung: Secondary | ICD-10-CM | POA: Diagnosis not present

## 2021-03-08 DIAGNOSIS — Z298 Encounter for other specified prophylactic measures: Secondary | ICD-10-CM | POA: Diagnosis not present

## 2021-03-08 DIAGNOSIS — Z510 Encounter for antineoplastic radiation therapy: Secondary | ICD-10-CM | POA: Diagnosis not present

## 2021-03-09 ENCOUNTER — Telehealth: Payer: Self-pay | Admitting: Radiation Oncology

## 2021-03-09 ENCOUNTER — Other Ambulatory Visit: Payer: Self-pay | Admitting: *Deleted

## 2021-03-09 NOTE — Progress Notes (Signed)
t

## 2021-03-09 NOTE — Telephone Encounter (Signed)
I spoke with the patient last night to let her know her MRI scan did not show any parenchymal disease in the brain.  We discussed the findings in the calvarium which was not outlined in the MRI scan that she had previously had.  I let her know that Dr. Lisbeth Renshaw plans to still proceed with prophylactic cranial irradiation, there may be some overlap in the treatment plan with the calvarial lesion, but we do intend to discuss her case in multidisciplinary brain oncology conference this coming week.  I will let her know the conversation from that conference.  She does describe that in the midst of chemotherapy she noticed a soft spot over her scalp in that location, it was not elevated or in any way palpable but felt that it was softer.  This improved without intervention.  She denies any recent trauma, prior injections for headaches or any other known anomalies of the skull.  I let her know that there may be a role for additional imaging such as a bone scan, possible bone biopsy, versus surveillance as she will be having MRI scans every 3 months the first year following her treatment.  She is in agreement with this plan.

## 2021-03-13 ENCOUNTER — Encounter: Payer: Self-pay | Admitting: Radiation Oncology

## 2021-03-13 NOTE — Progress Notes (Signed)
I spoke with the patient this morning after her case was discussed in brain oncology conference.  Dr. Nevada Crane and neuroradiology felt that the lesion was most consistent with benign findings such as a hemangioma, it was recommended that we continue to keep a close check on the finding in the calvarium with subsequent imaging and Dr. Lisbeth Renshaw would plan to repeat an MRI approximately 3 months after completing whole brain radiation for prophyla xis. The patient is in agreement with this plan and will begin prophylactic cranial Radiation this week.

## 2021-03-14 DIAGNOSIS — Z298 Encounter for other specified prophylactic measures: Secondary | ICD-10-CM | POA: Diagnosis not present

## 2021-03-14 DIAGNOSIS — C3411 Malignant neoplasm of upper lobe, right bronchus or lung: Secondary | ICD-10-CM | POA: Diagnosis not present

## 2021-03-14 DIAGNOSIS — Z510 Encounter for antineoplastic radiation therapy: Secondary | ICD-10-CM | POA: Diagnosis not present

## 2021-03-15 ENCOUNTER — Other Ambulatory Visit: Payer: Self-pay | Admitting: Radiation Oncology

## 2021-03-15 ENCOUNTER — Other Ambulatory Visit: Payer: Self-pay

## 2021-03-15 ENCOUNTER — Ambulatory Visit
Admission: RE | Admit: 2021-03-15 | Discharge: 2021-03-15 | Disposition: A | Discharge status: Home / Self Care | Payer: Medicare Other | Source: Ambulatory Visit | Attending: Radiation Oncology | Admitting: Radiation Oncology

## 2021-03-15 DIAGNOSIS — Z510 Encounter for antineoplastic radiation therapy: Secondary | ICD-10-CM | POA: Diagnosis not present

## 2021-03-15 DIAGNOSIS — Z298 Encounter for other specified prophylactic measures: Secondary | ICD-10-CM | POA: Diagnosis not present

## 2021-03-15 DIAGNOSIS — C3411 Malignant neoplasm of upper lobe, right bronchus or lung: Secondary | ICD-10-CM | POA: Diagnosis not present

## 2021-03-16 ENCOUNTER — Ambulatory Visit
Admission: RE | Admit: 2021-03-16 | Discharge: 2021-03-16 | Disposition: A | Discharge status: Home / Self Care | Payer: Medicare Other | Source: Ambulatory Visit | Attending: Radiation Oncology | Admitting: Radiation Oncology

## 2021-03-16 DIAGNOSIS — Z510 Encounter for antineoplastic radiation therapy: Secondary | ICD-10-CM | POA: Diagnosis not present

## 2021-03-16 DIAGNOSIS — Z298 Encounter for other specified prophylactic measures: Secondary | ICD-10-CM | POA: Diagnosis not present

## 2021-03-16 DIAGNOSIS — C3411 Malignant neoplasm of upper lobe, right bronchus or lung: Secondary | ICD-10-CM | POA: Diagnosis not present

## 2021-03-17 ENCOUNTER — Ambulatory Visit
Admission: RE | Admit: 2021-03-17 | Discharge: 2021-03-17 | Disposition: A | Discharge status: Home / Self Care | Payer: Medicare Other | Source: Ambulatory Visit | Attending: Radiation Oncology | Admitting: Radiation Oncology

## 2021-03-17 ENCOUNTER — Other Ambulatory Visit: Payer: Self-pay

## 2021-03-17 DIAGNOSIS — C3411 Malignant neoplasm of upper lobe, right bronchus or lung: Secondary | ICD-10-CM | POA: Diagnosis not present

## 2021-03-17 DIAGNOSIS — Z298 Encounter for other specified prophylactic measures: Secondary | ICD-10-CM | POA: Diagnosis not present

## 2021-03-17 DIAGNOSIS — Z510 Encounter for antineoplastic radiation therapy: Secondary | ICD-10-CM | POA: Diagnosis not present

## 2021-03-20 ENCOUNTER — Ambulatory Visit
Admission: RE | Admit: 2021-03-20 | Discharge: 2021-03-20 | Disposition: A | Discharge status: Home / Self Care | Payer: Medicare Other | Source: Ambulatory Visit | Attending: Radiation Oncology | Admitting: Radiation Oncology

## 2021-03-20 ENCOUNTER — Other Ambulatory Visit: Payer: Self-pay

## 2021-03-20 DIAGNOSIS — C3411 Malignant neoplasm of upper lobe, right bronchus or lung: Secondary | ICD-10-CM | POA: Diagnosis not present

## 2021-03-20 DIAGNOSIS — Z510 Encounter for antineoplastic radiation therapy: Secondary | ICD-10-CM | POA: Diagnosis not present

## 2021-03-20 DIAGNOSIS — Z298 Encounter for other specified prophylactic measures: Secondary | ICD-10-CM | POA: Diagnosis not present

## 2021-03-21 ENCOUNTER — Ambulatory Visit
Admission: RE | Admit: 2021-03-21 | Discharge: 2021-03-21 | Disposition: A | Discharge status: Home / Self Care | Payer: Medicare Other | Source: Ambulatory Visit | Attending: Radiation Oncology | Admitting: Radiation Oncology

## 2021-03-21 DIAGNOSIS — Z510 Encounter for antineoplastic radiation therapy: Secondary | ICD-10-CM | POA: Diagnosis not present

## 2021-03-21 DIAGNOSIS — C3411 Malignant neoplasm of upper lobe, right bronchus or lung: Secondary | ICD-10-CM | POA: Diagnosis not present

## 2021-03-21 DIAGNOSIS — Z298 Encounter for other specified prophylactic measures: Secondary | ICD-10-CM | POA: Diagnosis not present

## 2021-03-22 ENCOUNTER — Other Ambulatory Visit: Payer: Self-pay

## 2021-03-22 ENCOUNTER — Ambulatory Visit
Admission: RE | Admit: 2021-03-22 | Discharge: 2021-03-22 | Disposition: A | Discharge status: Home / Self Care | Payer: Medicare Other | Source: Ambulatory Visit | Attending: Radiation Oncology | Admitting: Radiation Oncology

## 2021-03-22 DIAGNOSIS — Z298 Encounter for other specified prophylactic measures: Secondary | ICD-10-CM | POA: Diagnosis not present

## 2021-03-22 DIAGNOSIS — Z510 Encounter for antineoplastic radiation therapy: Secondary | ICD-10-CM | POA: Diagnosis not present

## 2021-03-22 DIAGNOSIS — C3411 Malignant neoplasm of upper lobe, right bronchus or lung: Secondary | ICD-10-CM | POA: Diagnosis not present

## 2021-03-23 ENCOUNTER — Ambulatory Visit
Admission: RE | Admit: 2021-03-23 | Discharge: 2021-03-23 | Disposition: A | Discharge status: Home / Self Care | Payer: Medicare Other | Source: Ambulatory Visit | Attending: Radiation Oncology | Admitting: Radiation Oncology

## 2021-03-23 DIAGNOSIS — C3411 Malignant neoplasm of upper lobe, right bronchus or lung: Secondary | ICD-10-CM | POA: Diagnosis not present

## 2021-03-23 DIAGNOSIS — Z510 Encounter for antineoplastic radiation therapy: Secondary | ICD-10-CM | POA: Diagnosis not present

## 2021-03-23 DIAGNOSIS — Z298 Encounter for other specified prophylactic measures: Secondary | ICD-10-CM | POA: Diagnosis not present

## 2021-03-24 ENCOUNTER — Ambulatory Visit
Admission: RE | Admit: 2021-03-24 | Discharge: 2021-03-24 | Disposition: A | Discharge status: Home / Self Care | Payer: Medicare Other | Source: Ambulatory Visit | Attending: Radiation Oncology | Admitting: Radiation Oncology

## 2021-03-24 ENCOUNTER — Other Ambulatory Visit: Payer: Self-pay

## 2021-03-24 DIAGNOSIS — Z510 Encounter for antineoplastic radiation therapy: Secondary | ICD-10-CM | POA: Diagnosis not present

## 2021-03-24 DIAGNOSIS — C3411 Malignant neoplasm of upper lobe, right bronchus or lung: Secondary | ICD-10-CM | POA: Diagnosis not present

## 2021-03-24 DIAGNOSIS — Z298 Encounter for other specified prophylactic measures: Secondary | ICD-10-CM | POA: Diagnosis not present

## 2021-03-27 ENCOUNTER — Ambulatory Visit
Admission: RE | Admit: 2021-03-27 | Discharge: 2021-03-27 | Disposition: A | Discharge status: Home / Self Care | Payer: Medicare Other | Source: Ambulatory Visit | Attending: Radiation Oncology | Admitting: Radiation Oncology

## 2021-03-27 DIAGNOSIS — C3411 Malignant neoplasm of upper lobe, right bronchus or lung: Secondary | ICD-10-CM | POA: Insufficient documentation

## 2021-03-27 DIAGNOSIS — Z298 Encounter for other specified prophylactic measures: Secondary | ICD-10-CM | POA: Insufficient documentation

## 2021-03-27 DIAGNOSIS — Z510 Encounter for antineoplastic radiation therapy: Secondary | ICD-10-CM | POA: Insufficient documentation

## 2021-03-28 ENCOUNTER — Ambulatory Visit
Admission: RE | Admit: 2021-03-28 | Discharge: 2021-03-28 | Disposition: A | Discharge status: Home / Self Care | Payer: Medicare Other | Source: Ambulatory Visit | Attending: Radiation Oncology | Admitting: Radiation Oncology

## 2021-03-28 ENCOUNTER — Other Ambulatory Visit: Payer: Self-pay

## 2021-03-28 ENCOUNTER — Encounter: Payer: Self-pay | Admitting: Emergency Medicine

## 2021-03-28 ENCOUNTER — Ambulatory Visit (INDEPENDENT_AMBULATORY_CARE_PROVIDER_SITE_OTHER): Payer: Medicare Other | Admitting: Emergency Medicine

## 2021-03-28 ENCOUNTER — Encounter: Payer: Self-pay | Admitting: Radiation Oncology

## 2021-03-28 DIAGNOSIS — C3411 Malignant neoplasm of upper lobe, right bronchus or lung: Secondary | ICD-10-CM | POA: Diagnosis not present

## 2021-03-28 DIAGNOSIS — R059 Cough, unspecified: Secondary | ICD-10-CM | POA: Diagnosis not present

## 2021-03-28 DIAGNOSIS — C3491 Malignant neoplasm of unspecified part of right bronchus or lung: Secondary | ICD-10-CM | POA: Diagnosis not present

## 2021-03-28 DIAGNOSIS — J449 Chronic obstructive pulmonary disease, unspecified: Secondary | ICD-10-CM | POA: Diagnosis not present

## 2021-03-28 DIAGNOSIS — Z298 Encounter for other specified prophylactic measures: Secondary | ICD-10-CM | POA: Diagnosis not present

## 2021-03-28 DIAGNOSIS — Z510 Encounter for antineoplastic radiation therapy: Secondary | ICD-10-CM | POA: Diagnosis not present

## 2021-03-28 NOTE — Progress Notes (Signed)
Subjective:    Patient ID: Susan Cortez, female    DOB: [birth year], 60 y.o.   MRN: [id number]  HPI 60 year old active smoker (40 pack years) with history of allergic rhinitis, osteoarthritis with cervical stenosis, prediabetes, COPD. She is referred today for abnormal CT scan of the chest.  She underwent a lung cancer screening CT on 06/16/2020 which I have reviewed, this shows an irregularly shaped somewhat branching nodular area in the posterior right upper lobe, 10 mm, without any associated airspace disease.  Question mucoid impaction versus nodule.  She reports that she has been feeling well. She does have chronic fatigue, chronic pain. She has poor sleep. She has exertional SOB when carrying groceries. Able to walk without trouble. She tried Spiriva without any benefit. No cough, no wheeze.  No PFT available at this time.   60 year old smoker with allergic rhinitis, OA, prediabetes, carries a diagnosis of COPD.  I saw her in November for a irregularly shaped pulmonary nodule, linear opacity seen on screening CT scan. No new issues reported. Still smoking a pack a day. She has CSY scheduled for 10/24/20  CT chest 10/05/2020 reviewed by me shows some stable emphysematous change, slight increase in size and a branching nodular lesion in the right upper lobe suspicious for endobronchial tumor.  Also progression of mediastinal lymphadenopathy with a 1.8 cm precarinal node, right paratracheal node, some right hilar adenopathy  Pulmonary function testing done today reviewed by me, shows grossly normal airflows without a bronchodilator response, normal lung volumes, decreased diffusion capacity that corrects to the normal range when adjusted for alveolar volume  ROV 12/19/20 --this follow-up visit for patient with tobacco use, COPD and new diagnosis small cell lung cancer that was made by bronchoscopy 10/18/2020.  4R, 10 R, 11 R sampled as well as right upper lobe endobronchial lesion.  Limited stage  disease undergoing chemoradiation.  As above she has grossly normal PFT.  She is still smoking 5-6 cig a day. She has some dyspnea and fatigue after cisplatin. No wheeze or cough. No sputum. No hemoptysis.   ROV 03/28/21 --60 year old woman with a history of tobacco use, allergic rhinitis, osteoarthritis, COPD, small cell lung cancer diagnosed 09/2020.  She has undergone. She reports today that she has finished chemo, just finished XRT yesterday. She gets her surveillance CT chest in September. She is worried about her mother who is quite ill - several illnesses in the family actually. A lot fatigue. She is having more dry cough since last time. Took azithro about 2 months ago. No real predictability - can wake her from sleep. She is using mucinex and delsym. She has a lot of nasosinus drainage. No GERD sx. She has albuterol, has never used it. Can hear some wheeze when she lays down at night. She is smoking 1 pk a day now.    Review of Systems As per HPI     Objective:   Physical Exam Vitals:   03/28/21 1011  BP: 118/72  Pulse: (!) 116  Temp: 98.1 F (36.7 C)  TempSrc: Oral  SpO2: 95%  Weight: 208 lb 3.2 oz (94.4 kg)  Height: 5' 8.5" (1.74 m)   Gen: Pleasant, well-nourished, in no distress,  normal affect  ENT: No lesions,  mouth clear,  oropharynx clear, no postnasal drip  Neck: No JVD, no stridor  Lungs: No use of accessory muscles, no crackles or wheezing on normal respiration, no wheeze on forced expiration  Cardiovascular: RRR, heart sounds normal, no murmur or  gallops, no peripheral edema  Musculoskeletal: No deformities, no cyanosis or clubbing  Neuro: alert, awake, non focal  Skin: Warm, no lesions or rash      Assessment & Plan:  COPD, mild ([place]) She has albuterol available.  Never uses it.  Mild obstruction.  Most significant intervention at this point would be to stop her smoking which we discussed today.  Small cell carcinoma of right lung [facility]) Undergoing  chemoradiation with plans to repeat her imaging in September.  Cough She does have chronic rhinitis, denies any GERD.  We will try to treat the rhinitis initially to see if she gets benefit.  Also discussed with her the possibility that her increased cigarette use is contributing to dry cough.   Try to work hard on decreasing your cigarettes Try starting loratadine 10 mg (Claritin) once daily Try starting fluticasone nasal spray, 2 sprays each nostril once daily. Follow Dr. Lamonte Sakai in October so that we can review your status.  Baltazar Apo, MD, PhD 03/28/2021, 10:52 AM [facility] [phone] or if no answer [phone]

## 2021-03-28 NOTE — Assessment & Plan Note (Signed)
Undergoing chemoradiation with plans to repeat her imaging in September.

## 2021-03-28 NOTE — Assessment & Plan Note (Signed)
She does have chronic rhinitis, denies any GERD.  We will try to treat the rhinitis initially to see if she gets benefit.  Also discussed with her the possibility that her increased cigarette use is contributing to dry cough.   Try to work hard on decreasing your cigarettes Try starting loratadine 10 mg (Claritin) once daily Try starting fluticasone nasal spray, 2 sprays each nostril once daily. Follow Dr. Lamonte Sakai in October so that we can review your status.

## 2021-03-28 NOTE — Patient Instructions (Signed)
Get your repeat CT scan in September as planned with Dr. Julien Nordmann and Dr. Lisbeth Renshaw Try to work hard on decreasing your cigarettes Try starting loratadine 10 mg (Claritin) once daily Try starting fluticasone nasal spray, 2 sprays each nostril once daily. Keep your albuterol available to use 2 puffs if needed for shortness of breath, chest tightness, wheezing. Follow Dr. Lamonte Sakai in October so that we can review your status.

## 2021-03-28 NOTE — Assessment & Plan Note (Signed)
She has albuterol available.  Never uses it.  Mild obstruction.  Most significant intervention at this point would be to stop her smoking which we discussed today.

## 2021-04-04 ENCOUNTER — Other Ambulatory Visit: Payer: Self-pay

## 2021-04-04 ENCOUNTER — Encounter: Payer: Self-pay | Admitting: Internal Medicine

## 2021-04-04 ENCOUNTER — Ambulatory Visit
Admission: RE | Admit: 2021-04-04 | Payer: Medicare Other | Source: Ambulatory Visit | Attending: Radiation Oncology | Admitting: Radiation Oncology

## 2021-04-04 NOTE — Progress Notes (Signed)
                                                                                                                                                             Patient Name: Susan Cortez MRN: [id number] DOB: [birth year] Referring Physician: Lamar Blinks (Profile Not Attached) Date of Service: 03/28/2021 [facility], [place]                                                        End Of Treatment Note  Diagnoses: C34.11-Malignant neoplasm of upper lobe, right bronchus or lung Z29.8-Encounter for other specified prophylactic measures  Cancer Staging: Limited Stage Small Cell Carcinoma of the RUL.   Intent: Palliative  Radiation Treatment Dates: 03/15/2021 through 03/28/2021 Site Technique Total Dose (Gy) Dose per Fx (Gy) Completed Fx Beam Energies  Brain: Brain Whole Brain Complex 25/25 2.5 10/10 6X   Narrative: The patient tolerated radiation therapy relatively well.   Plan: The patient will receive a call in about one month from the radiation oncology department and will be followed in the brain oncology program for surveillance. She will continue follow up with Dr. Julien Nordmann as well.   ________________________________________________    Carola Rhine, [facility]

## 2021-04-10 ENCOUNTER — Telehealth: Payer: Self-pay | Admitting: Medical Oncology

## 2021-04-10 NOTE — Telephone Encounter (Signed)
Pt reports 10 lb weight gain and pedal edema since last Thursday .   She is elevating her legs . Swelling goes down overnight and increases during the day.  I instructed pt to contact PCP.  Dull headaches started Thursday and now daily . They are always in the afternoon @ frontal area. She has nasal stuffiness . She denies facial pain.  She stated she has "sunburn " from radiation. She is taking tylenol.

## 2021-04-13 ENCOUNTER — Ambulatory Visit (INDEPENDENT_AMBULATORY_CARE_PROVIDER_SITE_OTHER): Payer: Medicare Other | Admitting: Internal Medicine

## 2021-04-13 ENCOUNTER — Other Ambulatory Visit: Payer: Self-pay

## 2021-04-13 ENCOUNTER — Encounter: Payer: Self-pay | Admitting: Internal Medicine

## 2021-04-13 VITALS — BP 132/74 | HR 103 | Temp 98.1°F | Resp 18 | Ht 68.5 in | Wt 209.4 lb

## 2021-04-13 DIAGNOSIS — R21 Rash and other nonspecific skin eruption: Secondary | ICD-10-CM | POA: Diagnosis not present

## 2021-04-13 DIAGNOSIS — M25471 Effusion, right ankle: Secondary | ICD-10-CM | POA: Diagnosis not present

## 2021-04-13 DIAGNOSIS — R202 Paresthesia of skin: Secondary | ICD-10-CM

## 2021-04-13 DIAGNOSIS — M25472 Effusion, left ankle: Secondary | ICD-10-CM | POA: Diagnosis not present

## 2021-04-13 MED ORDER — KETOCONAZOLE 2 % EX CREA: 1.0000 "application " | TOPICAL_CREAM | Freq: Every day | CUTANEOUS | 0 refills | Status: DC

## 2021-04-13 NOTE — Progress Notes (Signed)
Subjective:    Patient ID: Susan Cortez, female    DOB: [birth year], 60 y.o.   MRN: [id number]  DOS:  04/13/2021 Type of visit - description: Acute  Patient went to [place] for 1 week, while in [place] had bilateral lower extremity edema.  She gained some weight. She came back to [place] 4 days ago, since then swelling is gone, she is back to her normal weight. She is concerned because she is having some tingling at the inner aspect of the ankles. On further questioning, she also has some toes and heels tingling for few weeks.  Also, developed a rash at the R axillary area, slightly itchy.  Review of Systems Denies chest pain no difficulty breathing  Past Medical History:  Diagnosis Date   Allergy    Arthritis    Basal cell carcinoma 2018   Removed from Right neck per patient    Diverticulosis 2013   HLD (hyperlipidemia)    Hx of adenomatous colonic polyps 2013   OAB (overactive bladder)    Pre-diabetes    Small cell lung cancer ([place]) 11/09/2020    Past Surgical History:  Procedure Laterality Date   CESAREAN SECTION     X 3   COLONOSCOPY  2013, 2016, 2019   hx colon polyps   DILATION AND CURETTAGE OF UTERUS     FUDUCIAL PLACEMENT Right 10/18/2020   Procedure: PLACEMENT OF FUDUCIAL;  Surgeon: Collene Gobble, MD;  Location: [facility];  Service: Thoracic;  Laterality: Right;   IR IMAGING GUIDED PORT INSERTION  11/29/2020   SHOULDER ARTHROSCOPY WITH OPEN ROTATOR CUFF REPAIR Right 05/2009   TONSILLECTOMY  1977   TUBAL LIGATION     VIDEO BRONCHOSCOPY WITH ENDOBRONCHIAL NAVIGATION N/A 10/18/2020   Procedure: VIDEO BRONCHOSCOPY WITH ENDOBRONCHIAL NAVIGATION;  Surgeon: Collene Gobble, MD;  [facility];  Service: Thoracic;  Laterality: N/A;   VIDEO BRONCHOSCOPY WITH ENDOBRONCHIAL ULTRASOUND N/A 10/18/2020   Procedure: VIDEO BRONCHOSCOPY WITH ENDOBRONCHIAL ULTRASOUND;  Surgeon: Collene Gobble, MD;  [facility];  Service: Thoracic;  Laterality: N/A;   WISDOM TOOTH  EXTRACTION      Allergies as of 04/13/2021       Reactions   Lyrica [pregabalin] Swelling   Headache,severe eye pain, swelling   Butrans [buprenorphine] Itching   Itching and redness at the area of the patch.   Chantix [varenicline] Other (See Comments)   Strange dreams/hallucinations   Diclofenac Swelling   Meloxicam Swelling   Tolmetin Other (See Comments)   Stomach pain   Nsaids Other (See Comments)   Stomach pain        Medication List        Accurate as of April 13, 2021 11:59 PM. If you have any questions, ask your nurse or doctor.          STOP taking these medications    azithromycin 250 MG tablet Commonly known as: Zithromax Z-Pak Stopped by: Kathlene November, MD       TAKE these medications    acetaminophen-codeine 300-60 MG tablet Commonly known as: TYLENOL #4 Take 1 tablet by mouth daily.   albuterol 108 (90 Base) MCG/ACT inhaler Commonly known as: VENTOLIN HFA Inhale 2 puffs into the lungs every 6 (six) hours as needed for wheezing or shortness of breath.   B-12 100 MCG Tabs   CALCIUM PO Take 650 mg by mouth daily.   ketoconazole 2 % cream Commonly known as: NIZORAL Apply 1 application topically daily. Started  by: Kathlene November, MD   lidocaine-prilocaine cream Commonly known as: EMLA Apply 1 application topically as needed.   LORazepam 1 MG tablet Commonly known as: ATIVAN Take 0.5 tablets (0.5 mg total) by mouth every 8 (eight) hours. Take 1 tablet po 30 minutes prior to MRI or radiation   lovastatin 20 MG tablet Commonly known as: MEVACOR TAKE 1 TABLET(20 MG) BY MOUTH AT BEDTIME What changed:  how much to take how to take this when to take this additional instructions   memantine 10 MG tablet Commonly known as: Namenda Take 1 tablet (10 mg total) by mouth 2 (two) times daily. What changed: Another medication with the same name was removed. Continue taking this medication, and follow the directions you see here. Changed by: Kathlene November,  MD   mirabegron ER 50 MG Tb24 tablet Commonly known as: Myrbetriq Take 1 tablet (50 mg total) by mouth daily.   Multivitamin Adult (Minerals) Tabs   prochlorperazine 10 MG tablet Commonly known as: COMPAZINE Take 1 tablet (10 mg total) by mouth every 6 (six) hours as needed.   Vitamin D3 125 MCG (5000 UT) Caps Take 5,000 Units by mouth daily.           Objective:   Physical Exam BP 132/74 (BP Location: Left Arm, Patient Position: Sitting, Cuff Size: Normal)   Pulse (!) 103   Temp 98.1 F (36.7 C) (Oral)   Resp 18   Ht 5' 8.5" (1.74 m)   Wt 209 lb 6 oz (95 kg)   LMP  (LMP Unknown)   SpO2 95%   BMI 31.37 kg/m  General:   Well developed, NAD, BMI noted. Neck: No JVD at 45 degrees HEENT:  Normocephalic . Face symmetric, atraumatic Lungs:  CTA B Normal respiratory effort, no intercostal retractions, no accessory muscle use. Heart: RRR,  no murmur.  Lower extremities: no pretibial edema bilaterally.  Calves symmetric, nontender Skin: R axilla has a ring shaped, macular, erythematous, slightly scaly rash Neurologic:  alert & oriented X3.  Speech normal, gait appropriate for age and unassisted. DTRs symmetric.  Pinprick examination normal Psych--  Cognition and judgment appear intact.  Cooperative with normal attention span and concentration.  Behavior appropriate. No anxious or depressed appearing.      Assessment     60 year old female, PMH includes high cholesterol, chronic pain, smoker, lung cancer on chemoradiation, COPD, presents with:   Bilateral feet, ankle edema: In the context of trip to [place], resolved, calves are now symmetric and nontender, recommend low-salt diet and leg elevation. Paresthesias: At the lower extremities, as described above, could be related to chemotherapy.  Symptoms for now are mild.  Recommend observation, if symptoms increase recommend to call.  See AVS Rash: Suspect fungal, ketoconazole.    This visit occurred during the  SARS-CoV-2 public health emergency.  Safety protocols were in place, including screening questions prior to the visit, additional usage of staff PPE, and extensive cleaning of exam room while observing appropriate contact time as indicated for disinfecting solutions.

## 2021-04-13 NOTE — Patient Instructions (Signed)
Watch your salt intake  Elevating your legs twice daily  You probably have neuropathy, if the symptoms  are increasingly worse please contact this office or oncology

## 2021-04-17 ENCOUNTER — Encounter: Payer: Medicare Other | Attending: Registered Nurse | Admitting: Registered Nurse

## 2021-04-17 ENCOUNTER — Ambulatory Visit: Payer: Medicare Other

## 2021-04-17 ENCOUNTER — Encounter: Payer: Self-pay | Admitting: Registered Nurse

## 2021-04-17 ENCOUNTER — Other Ambulatory Visit: Payer: Self-pay

## 2021-04-17 VITALS — BP 131/81 | HR 106 | Temp 98.5°F | Ht 68.5 in | Wt 207.0 lb

## 2021-04-17 DIAGNOSIS — G8929 Other chronic pain: Secondary | ICD-10-CM | POA: Insufficient documentation

## 2021-04-17 DIAGNOSIS — G894 Chronic pain syndrome: Secondary | ICD-10-CM | POA: Insufficient documentation

## 2021-04-17 DIAGNOSIS — R000 Tachycardia, unspecified: Secondary | ICD-10-CM | POA: Diagnosis not present

## 2021-04-17 DIAGNOSIS — M25511 Pain in right shoulder: Secondary | ICD-10-CM | POA: Insufficient documentation

## 2021-04-17 DIAGNOSIS — M5412 Radiculopathy, cervical region: Secondary | ICD-10-CM | POA: Diagnosis not present

## 2021-04-17 DIAGNOSIS — M4802 Spinal stenosis, cervical region: Secondary | ICD-10-CM | POA: Insufficient documentation

## 2021-04-17 DIAGNOSIS — M545 Low back pain, unspecified: Secondary | ICD-10-CM | POA: Diagnosis not present

## 2021-04-17 DIAGNOSIS — Z79891 Long term (current) use of opiate analgesic: Secondary | ICD-10-CM | POA: Diagnosis not present

## 2021-04-17 DIAGNOSIS — M542 Cervicalgia: Secondary | ICD-10-CM | POA: Insufficient documentation

## 2021-04-17 DIAGNOSIS — Z5181 Encounter for therapeutic drug level monitoring: Secondary | ICD-10-CM | POA: Diagnosis not present

## 2021-04-17 MED ORDER — ACETAMINOPHEN-CODEINE #4 300-60 MG PO TABS: 1.0000 | ORAL_TABLET | Freq: Every day | ORAL | 2 refills | Status: DC

## 2021-04-17 NOTE — Progress Notes (Signed)
Subjective:    Patient ID: Susan Cortez, female    DOB: 05-03-1961, 60 y.o.   MRN: [id number]  HPI: Susan Cortez is a 60 y.o. female who returns for follow up appointment for chronic pain and medication refill. She states her pain is located in her neck radiating into her right shoulder and lower back pain. She rates her pain 5. Her current exercise regime is walking.   Susan Cortez arrived tachycardic apical pulse check apical pulse 102, Susan Cortez reports she has noticed the tachycardic since her chemotherapy. She will F/U with her PCP and oncologist.   Susan Cortez Morphine equivalent is 9.00 MME.   UDS ordered today.   Pain Inventory Average Pain 6 Pain Right Now 5 My pain is constant, burning, dull, and aching  In the last 24 hours, has pain interfered with the following? General activity 0 Relation with others 0 Enjoyment of life 1 What TIME of day is your pain at its worst? daytime Sleep (in general) Poor  Pain is worse with: standing and some activites Pain improves with: rest, pacing activities, and medication Relief from Meds:  na  Family History  Problem Relation Age of Onset   Diabetes Father    Heart disease Father    Hyperlipidemia Father    Colon cancer Neg Hx    Colon polyps Neg Hx    Esophageal cancer Neg Hx    Rectal cancer Neg Hx    Stomach cancer Neg Hx    Social History   Socioeconomic History   Marital status: Divorced    Spouse name: Not on file   Number of children: Not on file   Years of education: Not on file   Highest education level: Not on file  Occupational History   Not on file  Tobacco Use   Smoking status: Every Day    Packs/day: 1.00    Years: 45.00    Pack years: 45.00    Types: Cigarettes   Smokeless tobacco: Never   Tobacco comments:    1 smoked per day 03/28/21 [facility]   Vaping Use   Vaping Use: Former  Substance and Sexual Activity   Alcohol use: No   Drug use: No   Sexual activity: Not on file    Comment: Tubal  Ligation  Other Topics Concern   Not on file  Social History Narrative   Not on file   Social Determinants of Health   Financial Resource Strain: Not on file  Food Insecurity: Not on file  Transportation Needs: Not on file  Physical Activity: Not on file  Stress: Not on file  Social Connections: Not on file   Past Surgical History:  Procedure Laterality Date   CESAREAN SECTION     X 3   COLONOSCOPY  2013, 2016, 2019   hx colon polyps   DILATION AND CURETTAGE OF UTERUS     FUDUCIAL PLACEMENT Right 10/18/2020   Procedure: PLACEMENT OF FUDUCIAL;  Surgeon: Collene Gobble, MD;  [facility];  Service: Thoracic;  Laterality: Right;   IR IMAGING GUIDED PORT INSERTION  11/29/2020   SHOULDER ARTHROSCOPY WITH OPEN ROTATOR CUFF REPAIR Right 05/2009   TONSILLECTOMY  1977   TUBAL LIGATION     VIDEO BRONCHOSCOPY WITH ENDOBRONCHIAL NAVIGATION N/A 10/18/2020   Procedure: VIDEO BRONCHOSCOPY WITH ENDOBRONCHIAL NAVIGATION;  Surgeon: Collene Gobble, MD;  [facility];  Service: Thoracic;  Laterality: N/A;   VIDEO BRONCHOSCOPY WITH ENDOBRONCHIAL ULTRASOUND N/A 10/18/2020  Procedure: VIDEO BRONCHOSCOPY WITH ENDOBRONCHIAL ULTRASOUND;  Surgeon: Collene Gobble, MD;  Location: [facility];  Service: Thoracic;  Laterality: N/A;   WISDOM TOOTH EXTRACTION     Past Surgical History:  Procedure Laterality Date   CESAREAN SECTION     X 3   COLONOSCOPY  2013, 2016, 2019   hx colon polyps   DILATION AND CURETTAGE OF UTERUS     FUDUCIAL PLACEMENT Right 10/18/2020   Procedure: PLACEMENT OF FUDUCIAL;  Surgeon: Collene Gobble, MD;  [facility];  Service: Thoracic;  Laterality: Right;   IR IMAGING GUIDED PORT INSERTION  11/29/2020   SHOULDER ARTHROSCOPY WITH OPEN ROTATOR CUFF REPAIR Right 05/2009   TONSILLECTOMY  1977   TUBAL LIGATION     VIDEO BRONCHOSCOPY WITH ENDOBRONCHIAL NAVIGATION N/A 10/18/2020   Procedure: VIDEO BRONCHOSCOPY WITH ENDOBRONCHIAL NAVIGATION;  Surgeon: Collene Gobble, MD;  Location: [facility];  Service: Thoracic;  Laterality: N/A;   VIDEO BRONCHOSCOPY WITH ENDOBRONCHIAL ULTRASOUND N/A 10/18/2020   Procedure: VIDEO BRONCHOSCOPY WITH ENDOBRONCHIAL ULTRASOUND;  Surgeon: Collene Gobble, MD;  [facility];  Service: Thoracic;  Laterality: N/A;   WISDOM TOOTH EXTRACTION     Past Medical History:  Diagnosis Date   Allergy    Arthritis    Basal cell carcinoma 2018   Removed from Right neck per patient    Diverticulosis 2013   HLD (hyperlipidemia)    Hx of adenomatous colonic polyps 2013   OAB (overactive bladder)    Pre-diabetes    Small cell lung cancer ([place]) 11/09/2020   BP 131/81 (BP Location: Left Arm, Patient Position: Sitting, Cuff Size: Normal)   Pulse (!) 106 Comment: apical  Temp 98.5 F (36.9 C)   Ht 5' 8.5" (1.74 m)   Wt 207 lb (93.9 kg)   LMP  (LMP Unknown)   SpO2 96%   BMI 31.02 kg/m   Opioid Risk Score:   Fall Risk Score:  `1  Depression screen PHQ 2/9  Depression screen [facility] 2/9 08/23/2020 03/10/2020 03/09/2019 12/29/2018 03/07/2018  Decreased Interest 0 0 0 0 0  Down, Depressed, Hopeless 0 0 0 0 0  PHQ - 2 Score 0 0 0 0 0  Some recent data might be hidden    Review of Systems  Constitutional: Negative.   HENT: Negative.    Eyes: Negative.   Respiratory: Negative.    Cardiovascular: Negative.   Gastrointestinal: Negative.   Endocrine: Negative.   Genitourinary: Negative.   Musculoskeletal:  Positive for arthralgias, back pain and neck pain.  Skin: Negative.   Allergic/Immunologic: Negative.   Neurological: Negative.   Hematological: Negative.   Psychiatric/Behavioral: Negative.    All other systems reviewed and are negative.     Objective:   Physical Exam Vitals and nursing note reviewed.  Constitutional:      Appearance: Normal appearance.  Neck:     Comments: Cervical Paraspinal Tenderness: C-5-C-6 Cardiovascular:     Rate and Rhythm: Regular rhythm. Tachycardia present.     Pulses: Normal pulses.     Heart sounds: Normal heart  sounds.  Pulmonary:     Effort: Pulmonary effort is normal.     Breath sounds: Normal breath sounds.  Musculoskeletal:     Cervical back: Normal range of motion and neck supple.     Comments: Normal Muscle Bulk and Muscle Testing Reveals:  Upper Extremities: Full ROM and Muscle Strength 5/5 Left AC Joint Tenderness  Lower Extremities: Full ROM and Muscle Strength 5/5 Arises from  chair with ease Narrow Based Gait     Skin:    General: Skin is warm and dry.  Neurological:     Mental Status: She is alert and oriented to person, place, and time.  Psychiatric:        Mood and Affect: Mood normal.        Behavior: Behavior normal.         Assessment & Plan:  1.Cervical spinal stenosis without evidence of radiculopathy. Chronic neck pain: Refilled Tylenol #4  300/60mg  daily, #30. 04/17/2021. We will continue the opioid monitoring program, this consists of regular clinic visits, examinations, urine drug screen, pill counts as well as use of [place] Controlled Substance Reporting system. A 12 month History has been reviewed on the [facility] on 04/17/2021. 2. Right shoulder rotator cuff tear, status post repair with chronic pain and contracture : Continue with exercise regime as tolerated .04/17/2021. 3. Insomnia: Continue Melatonin. 04/17/2021 4. Chronic Bilateral Lower Back Pain without Sciatica: Continue HEP as tolerated. Continue to Monitor. 04/17/2021  5. Right Greater Trochanter Bursitis: No complaints Today. Continue to Alternate Ice and Heat Therapy. Continue to Monitor.04/17/2021 6. Chronic right Sided Thoracic Back Pain: Mo complaints today. Continue current medication regime. Continue HEP as tolerated. Continue to monitor. 04/17/2021   F/U in 3 months

## 2021-04-18 ENCOUNTER — Other Ambulatory Visit: Payer: Self-pay | Admitting: Radiation Oncology

## 2021-04-18 ENCOUNTER — Ambulatory Visit (INDEPENDENT_AMBULATORY_CARE_PROVIDER_SITE_OTHER): Payer: Medicare Other

## 2021-04-18 VITALS — Ht 68.5 in | Wt 207.0 lb

## 2021-04-18 DIAGNOSIS — Z0000 Encounter for general adult medical examination without abnormal findings: Secondary | ICD-10-CM

## 2021-04-18 NOTE — Patient Instructions (Signed)
Susan Cortez , Thank you for taking time to complete your Medicare Wellness Visit. I appreciate your ongoing commitment to your health goals. Please review the following plan we discussed and let me know if I can assist you in the future.   Screening recommendations/referrals: Colonoscopy: Completed 10/24/2020-Due 10/25/2023 Mammogram: Completed 02/28/2021-Due 02/28/2022 Bone Density: Due at age 60 Recommended yearly ophthalmology/optometry visit for glaucoma screening and checkup Recommended yearly dental visit for hygiene and checkup  Vaccinations: Influenza vaccine: Due 04/2021 Pneumococcal vaccine: Due at age 9 Tdap vaccine: Up to date-Due 08/27/2021 Shingles vaccine: Completed vaccines  Covid-19: Booster due  Advanced directives: Copy in chart  Conditions/risks identified: See problem list  Next appointment: Follow up in one year for your annual wellness visit. 04/23/2022 _0 :00  Preventive Care 40-64 Years, Female Preventive care refers to lifestyle choices and visits with your health care provider that can promote health and wellness. What does preventive care include? A yearly physical exam. This is also called an annual well check. Dental exams once or twice a year. Routine eye exams. Ask your health care provider how often you should have your eyes checked. Personal lifestyle choices, including: Daily care of your teeth and gums. Regular physical activity. Eating a healthy diet. Avoiding tobacco and drug use. Limiting alcohol use. Practicing safe sex. Taking low-dose aspirin daily starting at age 24. Taking vitamin and mineral supplements as recommended by your health care provider. What happens during an annual well check? The services and screenings done by your health care provider during your annual well check will depend on your age, overall health, lifestyle risk factors, and family history of disease. Counseling  Your health care provider may ask you questions about  your: Alcohol use. Tobacco use. Drug use. Emotional well-being. Home and relationship well-being. Sexual activity. Eating habits. Work and work Statistician. Method of birth control. Menstrual cycle. Pregnancy history. Screening  You may have the following tests or measurements: Height, weight, and BMI. Blood pressure. Lipid and cholesterol levels. These may be checked every 5 years, or more frequently if you are over 43 years old. Skin check. Lung cancer screening. You may have this screening every year starting at age 37 if you have a 30-pack-year history of smoking and currently smoke or have quit within the past 15 years. Fecal occult blood test (FOBT) of the stool. You may have this test every year starting at age 55. Flexible sigmoidoscopy or colonoscopy. You may have a sigmoidoscopy every 5 years or a colonoscopy every 10 years starting at age 1. Hepatitis C blood test. Hepatitis B blood test. Sexually transmitted disease (STD) testing. Diabetes screening. This is done by checking your blood sugar (glucose) after you have not eaten for a while (fasting). You may have this done every 1-3 years. Mammogram. This may be done every 1-2 years. Talk to your health care provider about when you should start having regular mammograms. This may depend on whether you have a family history of breast cancer. BRCA-related cancer screening. This may be done if you have a family history of breast, ovarian, tubal, or peritoneal cancers. Pelvic exam and Pap test. This may be done every 3 years starting at age 67. Starting at age 87, this may be done every 5 years if you have a Pap test in combination with an HPV test. Bone density scan. This is done to screen for osteoporosis. You may have this scan if you are at high risk for osteoporosis. Discuss your test results, treatment options, and  if necessary, the need for more tests with your health care provider. Vaccines  Your health care provider may  recommend certain vaccines, such as: Influenza vaccine. This is recommended every year. Tetanus, diphtheria, and acellular pertussis (Tdap, Td) vaccine. You may need a Td booster every 10 years. Zoster vaccine. You may need this after age 3. Pneumococcal 13-valent conjugate (PCV13) vaccine. You may need this if you have certain conditions and were not previously vaccinated. Pneumococcal polysaccharide (PPSV23) vaccine. You may need one or two doses if you smoke cigarettes or if you have certain conditions. Talk to your health care provider about which screenings and vaccines you need and how often you need them. This information is not intended to replace advice given to you by your health care provider. Make sure you discuss any questions you have with your health care provider. Document Released: 09/09/2015 Document Revised: 05/02/2016 Document Reviewed: 06/14/2015 Elsevier Interactive Patient Education  2017 [employer] Prevention in the Home Falls can cause injuries. They can happen to people of all ages. There are many things you can do to make your home safe and to help prevent falls. What can I do on the outside of my home? Regularly fix the edges of walkways and driveways and fix any cracks. Remove anything that might make you trip as you walk through a door, such as a raised step or threshold. Trim any bushes or trees on the path to your home. Use bright outdoor lighting. Clear any walking paths of anything that might make someone trip, such as rocks or tools. Regularly check to see if handrails are loose or broken. Make sure that both sides of any steps have handrails. Any raised decks and porches should have guardrails on the edges. Have any leaves, snow, or ice cleared regularly. Use sand or salt on walking paths during winter. Clean up any spills in your garage right away. This includes oil or grease spills. What can I do in the bathroom? Use night lights. Install  grab bars by the toilet and in the tub and shower. Do not use towel bars as grab bars. Use non-skid mats or decals in the tub or shower. If you need to sit down in the shower, use a plastic, non-slip stool. Keep the floor dry. Clean up any water that spills on the floor as soon as it happens. Remove soap buildup in the tub or shower regularly. Attach bath mats securely with double-sided non-slip rug tape. Do not have throw rugs and other things on the floor that can make you trip. What can I do in the bedroom? Use night lights. Make sure that you have a light by your bed that is easy to reach. Do not use any sheets or blankets that are too big for your bed. They should not hang down onto the floor. Have a firm chair that has side arms. You can use this for support while you get dressed. Do not have throw rugs and other things on the floor that can make you trip. What can I do in the kitchen? Clean up any spills right away. Avoid walking on wet floors. Keep items that you use a lot in easy-to-reach places. If you need to reach something above you, use a strong step stool that has a grab bar. Keep electrical cords out of the way. Do not use floor polish or wax that makes floors slippery. If you must use wax, use non-skid floor wax. Do not have  throw rugs and other things on the floor that can make you trip. What can I do with my stairs? Do not leave any items on the stairs. Make sure that there are handrails on both sides of the stairs and use them. Fix handrails that are broken or loose. Make sure that handrails are as long as the stairways. Check any carpeting to make sure that it is firmly attached to the stairs. Fix any carpet that is loose or worn. Avoid having throw rugs at the top or bottom of the stairs. If you do have throw rugs, attach them to the floor with carpet tape. Make sure that you have a light switch at the top of the stairs and the bottom of the stairs. If you do not have  them, ask someone to add them for you. What else can I do to help prevent falls? Wear shoes that: Do not have high heels. Have rubber bottoms. Are comfortable and fit you well. Are closed at the toe. Do not wear sandals. If you use a stepladder: Make sure that it is fully opened. Do not climb a closed stepladder. Make sure that both sides of the stepladder are locked into place. Ask someone to hold it for you, if possible. Clearly mark and make sure that you can see: Any grab bars or handrails. First and last steps. Where the edge of each step is. Use tools that help you move around (mobility aids) if they are needed. These include: Canes. Walkers. Scooters. Crutches. Turn on the lights when you go into a dark area. Replace any light bulbs as soon as they burn out. Set up your furniture so you have a clear path. Avoid moving your furniture around. If any of your floors are uneven, fix them. If there are any pets around you, be aware of where they are. Review your medicines with your doctor. Some medicines can make you feel dizzy. This can increase your chance of falling. Ask your doctor what other things that you can do to help prevent falls. This information is not intended to replace advice given to you by your health care provider. Make sure you discuss any questions you have with your health care provider. Document Released: 06/09/2009 Document Revised: 01/19/2016 Document Reviewed: 09/17/2014 Elsevier Interactive Patient Education  2017 [employer].

## 2021-04-18 NOTE — Progress Notes (Signed)
Subjective:   Susan Cortez is a 60 y.o. female who presents for Medicare Annual (Subsequent) preventive examination.  I connected with Inessa today by telephone and verified that I am speaking with the correct person using two identifiers. Location patient: home Location provider: work Persons participating in the virtual visit: patient, Marine scientist.    I discussed the limitations, risks, security and privacy concerns of performing an evaluation and management service by telephone and the availability of in person appointments. I also discussed with the patient that there may be a patient responsible charge related to this service. The patient expressed understanding and verbally consented to this telephonic visit.    Interactive audio and video telecommunications were attempted between this provider and patient, however failed, due to patient having technical difficulties OR patient did not have access to video capability.  We continued and completed visit with audio only.  Some vital signs may be absent or patient reported.   Time Spent with patient on telephone encounter: 20 minutes   Review of Systems     Cardiac Risk Factors include: dyslipidemia;sedentary lifestyle;obesity (BMI >30kg/m2);smoking/ tobacco exposure     Objective:    Today's Vitals   04/18/21 1059  Weight: 207 lb (93.9 kg)  Height: 5' 8.5" (1.74 m)   Body mass index is 31.02 kg/m.  Advanced Directives 04/18/2021 02/21/2021 02/02/2021 01/31/2021 01/11/2021 01/10/2021 01/09/2021  Does Patient Have a Medical Advance Directive? Yes Yes Yes Yes Yes Yes Yes  Type of Paramedic of [place];Living will [place];Living will [place];Living will [place];Living will [place];Living will [place];Living will [place];Living will  Does patient want to make changes to medical advance  directive? - No - Patient declined No - Patient declined No - Patient declined No - Patient declined No - Patient declined No - Patient declined  Copy of [place] in Chart? Yes - validated most recent copy scanned in chart (See row information) Yes - validated most recent copy scanned in chart (See row information) Yes - validated most recent copy scanned in chart (See row information) Yes - validated most recent copy scanned in chart (See row information) - - -  Would patient like information on creating a medical advance directive? - - No - Patient declined - - - -    Current Medications (verified) Outpatient Encounter Medications as of 04/18/2021  Medication Sig   acetaminophen-codeine (TYLENOL #4) 300-60 MG tablet Take 1 tablet by mouth daily.   albuterol (VENTOLIN HFA) 108 (90 Base) MCG/ACT inhaler Inhale 2 puffs into the lungs every 6 (six) hours as needed for wheezing or shortness of breath.   CALCIUM PO Take 650 mg by mouth daily.   Cholecalciferol (VITAMIN D3) 125 MCG (5000 UT) CAPS Take 5,000 Units by mouth daily.   Cyanocobalamin (B-12) 100 MCG TABS    ketoconazole (NIZORAL) 2 % cream Apply 1 application topically daily.   lidocaine-prilocaine (EMLA) cream Apply 1 application topically as needed.   LORazepam (ATIVAN) 1 MG tablet Take 0.5 tablets (0.5 mg total) by mouth every 8 (eight) hours. Take 1 tablet po 30 minutes prior to MRI or radiation   lovastatin (MEVACOR) 20 MG tablet TAKE 1 TABLET(20 MG) BY MOUTH AT BEDTIME (Patient taking differently: Take 20 mg by mouth at bedtime.)   memantine (NAMENDA) 10 MG tablet Take 1 tablet (10 mg total) by mouth 2 (two) times daily.  mirabegron ER (MYRBETRIQ) 50 MG TB24 tablet Take 1 tablet (50 mg total) by mouth daily.   Multiple Vitamins-Minerals (MULTIVITAMIN ADULT, MINERALS,) TABS    prochlorperazine (COMPAZINE) 10 MG tablet Take 1 tablet (10 mg total) by mouth every 6 (six) hours as needed.   No facility-administered  encounter medications on file as of 04/18/2021.    Allergies (verified) Lyrica [pregabalin], Butrans [buprenorphine], Chantix [varenicline], Diclofenac, Meloxicam, Tolmetin, and Nsaids   History: Past Medical History:  Diagnosis Date   Allergy    Arthritis    Basal cell carcinoma 2018   Removed from Right neck per patient    Diverticulosis 2013   HLD (hyperlipidemia)    Hx of adenomatous colonic polyps 2013   OAB (overactive bladder)    Pre-diabetes    Small cell lung cancer ([place]) 11/09/2020   Past Surgical History:  Procedure Laterality Date   CESAREAN SECTION     X 3   COLONOSCOPY  2013, 2016, 2019   hx colon polyps   DILATION AND CURETTAGE OF UTERUS     FUDUCIAL PLACEMENT Right 10/18/2020   Procedure: PLACEMENT OF FUDUCIAL;  Surgeon: Collene Gobble, MD;  Location: [facility] OR;  Service: Thoracic;  Laterality: Right;   IR IMAGING GUIDED PORT INSERTION  11/29/2020   SHOULDER ARTHROSCOPY WITH OPEN ROTATOR CUFF REPAIR Right 05/2009   TONSILLECTOMY  1977   TUBAL LIGATION     VIDEO BRONCHOSCOPY WITH ENDOBRONCHIAL NAVIGATION N/A 10/18/2020   Procedure: VIDEO BRONCHOSCOPY WITH ENDOBRONCHIAL NAVIGATION;  Surgeon: Collene Gobble, MD;  Location: [facility] OR;  Service: Thoracic;  Laterality: N/A;   VIDEO BRONCHOSCOPY WITH ENDOBRONCHIAL ULTRASOUND N/A 10/18/2020   Procedure: VIDEO BRONCHOSCOPY WITH ENDOBRONCHIAL ULTRASOUND;  Surgeon: Collene Gobble, MD;  Location: [facility] OR;  Service: Thoracic;  Laterality: N/A;   WISDOM TOOTH EXTRACTION     Family History  Problem Relation Age of Onset   Diabetes Father    Heart disease Father    Hyperlipidemia Father    Colon cancer Neg Hx    Colon polyps Neg Hx    Esophageal cancer Neg Hx    Rectal cancer Neg Hx    Stomach cancer Neg Hx    Social History   Socioeconomic History   Marital status: Divorced    Spouse name: Not on file   Number of children: Not on file   Years of education: Not on file   Highest education level: Not on file  Occupational  History   Not on file  Tobacco Use   Smoking status: Every Day    Packs/day: 1.00    Years: 45.00    Pack years: 45.00    Types: Cigarettes   Smokeless tobacco: Never   Tobacco comments:    1 smoked per day 03/28/21 [facility]   Vaping Use   Vaping Use: Former  Substance and Sexual Activity   Alcohol use: No   Drug use: No   Sexual activity: Not on file    Comment: Tubal Ligation  Other Topics Concern   Not on file  Social History Narrative   Not on file   Social Determinants of Health   Financial Resource Strain: Low Risk    Difficulty of Paying Living Expenses: Not hard at all  Food Insecurity: No Food Insecurity   Worried About Charity fundraiser in the Last Year: Never true   Arboriculturist in the Last Year: Never true  Transportation Needs: No Transportation Needs   Lack of Transportation (Medical):  No   Lack of Transportation (Non-Medical): No  Physical Activity: Inactive   Days of Exercise per Week: 0 days   Minutes of Exercise per Session: 0 min  Stress: Stress Concern Present   Feeling of Stress : To some extent  Social Connections: Socially Isolated   Frequency of Communication with Friends and Family: More than three times a week   Frequency of Social Gatherings with Friends and Family: More than three times a week   Attends Religious Services: Never   Marine scientist or Organizations: No   Attends Music therapist: Never   Marital Status: Divorced    Tobacco Counseling Ready to quit: Not Answered Counseling given: Not Answered Tobacco comments: 1 smoked per day 03/28/21 ARJ    Clinical Intake:  Pre-visit preparation completed: Yes  Pain : No/denies pain     Nutritional Status: BMI > 30  Obese Nutritional Risks: None Diabetes: No  How often do you need to have someone help you when you read instructions, pamphlets, or other written materials from your doctor or pharmacy?: 1 - Never  Diabetic?No  Interpreter Needed?:  No  Information entered by :: Caroleen Hamman LPN   Activities of Daily Living In your present state of health, do you have any difficulty performing the following activities: 04/18/2021  Hearing? N  Vision? N  Difficulty concentrating or making decisions? Y  Comment from chemo, radiation & meds  Walking or climbing stairs? N  Dressing or bathing? N  Doing errands, shopping? N  Preparing Food and eating ? N  Using the Toilet? N  In the past six months, have you accidently leaked urine? N  Do you have problems with loss of bowel control? N  Managing your Medications? N  Managing your Finances? N  Housekeeping or managing your Housekeeping? N  Some recent data might be hidden    Patient Care Team: Copland, Gay Filler, MD as PCP - General (Family Medicine) Kirsteins, Luanna Salk, MD as Consulting Physician (Pain Medicine) Jola Schmidt, MD as Consulting Physician (Ophthalmology)  Indicate any recent Medical Services you may have received from other than [facility] providers in the past year (date may be approximate).     Assessment:   This is a routine wellness examination for Susan Cortez.  Hearing/Vision screen Hearing Screening - Comments:: C/o mild hearing loss. She thinks it is due to meds Vision Screening - Comments:: Last eye exam-03/2021-Dr. Bowen  Dietary issues and exercise activities discussed: Current Exercise Habits: The patient does not participate in regular exercise at present   Goals Addressed             This Visit's Progress    Patient Stated       Drink more water & eat healthier     Quit smoking / using tobacco   Not on track      Depression Screen [facility] 2/9 Scores 04/18/2021 08/23/2020 03/10/2020 03/09/2019 12/29/2018 03/07/2018 01/03/2018  PHQ - 2 Score 0 0 0 0 0 0 0  PHQ- 9 Score - - - - - - -  Exception Documentation - - - - - - -    Fall Risk Fall Risk  04/18/2021 04/17/2021 01/13/2021 07/19/2020 04/25/2020  Falls in the past year? 0 0 0 0 0  Comment - - - - -   Number falls in past yr: 0 - - - -  Injury with Fall? 0 - - - -  Comment - - - - -  Risk for fall  due to : - - - - -  Follow up Falls prevention discussed - - - -    FALL RISK PREVENTION PERTAINING TO THE HOME:  Any stairs in or around the home? No  Home free of loose throw rugs in walkways, pet beds, electrical cords, etc? Yes  Adequate lighting in your home to reduce risk of falls? Yes   ASSISTIVE DEVICES UTILIZED TO PREVENT FALLS:  Life alert? No  Use of a cane, walker or w/c? No  Grab bars in the bathroom? No  Shower chair or bench in shower? Yes  Elevated toilet seat or a handicapped toilet? No   TIMED UP AND GO:  Was the test performed? No . Phone visit   Cognitive Function:Patient states she has had some memory changes due to chemo & radiation. Oncologist has prescribed Namenda & told her these changes should just be temporary. MMSE - Mini Mental State Exam 03/07/2018 09/05/2016  Orientation to time 5 5  Orientation to Place 5 5  Registration 3 3  Attention/ Calculation 5 5  Recall 3 3  Language- name 2 objects 2 2  Language- repeat 1 1  Language- follow 3 step command 3 3  Language- read & follow direction 1 1  Write a sentence 1 1  Copy design 1 1  Total score 30 30        Immunizations Immunization History  Administered Date(s) Administered   Influenza,inj,Quad PF,6+ Mos 06/11/2018, 06/03/2019   PFIZER(Purple Top)SARS-COV-2 Vaccination 11/30/2019, 12/28/2019   Tdap 08/28/2011   Zoster Recombinat (Shingrix) 06/03/2019, 08/06/2019    TDAP status: Up to date  Flu vaccine status:Due 04/2021  Pneumococcal vaccine status: Up to date-patient states she has had a pneumonia vaccine ut does not know date.  Covid-19 vaccine status: Information provided on how to obtain vaccines. Booster due  Qualifies for Shingles Vaccine? No   Zostavax completed No   Shingrix Completed?: Yes  Screening Tests Health Maintenance  Topic Date Due   Pneumococcal Vaccine  45-51 Years old (1 - PCV) Never done   PAP SMEAR-Modifier  12/26/2018   COVID-19 Vaccine (3 - Pfizer risk series) 01/25/2020   INFLUENZA VACCINE  03/27/2021   TETANUS/TDAP  08/27/2021   MAMMOGRAM  03/01/2023   COLONOSCOPY (Pts 45-18yrs Insurance coverage will need to be confirmed)  10/25/2023   Hepatitis C Screening  Completed   HIV Screening  Completed   Zoster Vaccines- Shingrix  Completed   HPV VACCINES  Aged Out    Health Maintenance  Health Maintenance Due  Topic Date Due   Pneumococcal Vaccine 22-61 Years old (1 - PCV) Never done   PAP SMEAR-Modifier  12/26/2018   COVID-19 Vaccine (3 - Pfizer risk series) 01/25/2020   INFLUENZA VACCINE  03/27/2021    Colorectal cancer screening: Type of screening: Colonoscopy. Completed 10/24/2020. Repeat every 3 years  Mammogram status: Completed Bilateral 02/28/2021. Repeat every year  Bone Density status: Not yet indicated  Lung Cancer Screening: (Low Dose CT Chest recommended if Age 61-80 years, 30 pack-year currently smoking OR have quit w/in 15years.) does not qualify.   Lung Cancer Screening : Completed 06/14/2020  Additional Screening:  Hepatitis C Screening: Completed 12/26/2015  Vision Screening: Recommended annual ophthalmology exams for early detection of glaucoma and other disorders of the eye. Is the patient up to date with their annual eye exam?  Yes  Who is the provider or what is the name of the office in which the patient attends annual eye exams? Dr. Valetta Close  Dental Screening: Recommended annual dental exams for proper oral hygiene  Community Resource Referral / Chronic Care Management: CRR required this visit?  No   CCM required this visit?  No      Plan:     I have personally reviewed and noted the following in the patient's chart:   Medical and social history Use of alcohol, tobacco or illicit drugs  Current medications and supplements including opioid prescriptions.  Functional ability and  status Nutritional status Physical activity Advanced directives List of other physicians Hospitalizations, surgeries, and ER visits in previous 12 months Vitals Screenings to include cognitive, depression, and falls Referrals and appointments  In addition, I have reviewed and discussed with patient certain preventive protocols, quality metrics, and best practice recommendations. A written personalized care plan for preventive services as well as general preventive health recommendations were provided to patient.   Due to this being a telephonic visit, the after visit summary with patients personalized plan was offered to patient via mail or my-chart. Patient would like to access on my-chart.    Marta Antu, LPN   [id number]  Nurse Health Advisor  Nurse Notes: None

## 2021-04-19 LAB — TOXASSURE SELECT,+ANTIDEPR,UR

## 2021-04-20 ENCOUNTER — Telehealth: Payer: Self-pay | Admitting: *Deleted

## 2021-04-20 NOTE — Telephone Encounter (Signed)
Urine drug screen for this encounter is consistent for prescribed medication 

## 2021-04-25 ENCOUNTER — Ambulatory Visit
Admission: RE | Admit: 2021-04-25 | Discharge: 2021-04-25 | Disposition: A | Discharge status: Home / Self Care | Payer: Medicare Other | Source: Ambulatory Visit | Attending: Physician Assistant | Admitting: Physician Assistant

## 2021-04-25 DIAGNOSIS — C3491 Malignant neoplasm of unspecified part of right bronchus or lung: Secondary | ICD-10-CM

## 2021-04-28 ENCOUNTER — Other Ambulatory Visit: Payer: Self-pay | Admitting: Radiation Therapy

## 2021-04-28 DIAGNOSIS — C7931 Secondary malignant neoplasm of brain: Secondary | ICD-10-CM

## 2021-04-28 NOTE — Progress Notes (Signed)
  Radiation Oncology         [phone] ________________________________  Name: Susan Cortez MRN: [id number]  Date of Service: 04/25/2021  DOB: Mar 13, 1961  Post Treatment Telephone Note  Diagnosis:   Limited Stage Small Cell Carcinoma of the RUL.   Interval Since Last Radiation:  4 weeks   03/15/2021 through 03/28/2021 Site Technique Total Dose (Gy) Dose per Fx (Gy) Completed Fx Beam Energies  Brain: Brain Whole Brain Complex 25/25 2.5 10/10 6X    Narrative:  The patient was contacted today for routine follow-up. During treatment she did very well with radiotherapy and did not have significant desquamation. She reports she feels better and swelling in her feet which has resolved. She had brain fogginess which has also resolved. She is taking Namenda.  Impression/Plan: 1. Limited Stage Small Cell Carcinoma of the RUL. The patient has been doing well since completion of radiotherapy. We discussed that we would plan a repeat MRI in 2 months time. She will also continue to follow up with Dr. Julien Nordmann in medical oncology.        Carola Rhine, PAC

## 2021-05-08 ENCOUNTER — Other Ambulatory Visit: Payer: Self-pay | Admitting: Radiation Therapy

## 2021-05-24 ENCOUNTER — Inpatient Hospital Stay: Payer: Medicare Other | Attending: Physician Assistant

## 2021-05-24 ENCOUNTER — Other Ambulatory Visit: Payer: Self-pay

## 2021-05-24 ENCOUNTER — Encounter (HOSPITAL_COMMUNITY): Payer: Self-pay

## 2021-05-24 ENCOUNTER — Ambulatory Visit (HOSPITAL_COMMUNITY)
Admission: RE | Admit: 2021-05-24 | Discharge: 2021-05-24 | Disposition: A | Discharge status: Home / Self Care | Payer: Medicare Other | Source: Ambulatory Visit | Attending: Physician Assistant | Admitting: Physician Assistant

## 2021-05-24 DIAGNOSIS — C3411 Malignant neoplasm of upper lobe, right bronchus or lung: Secondary | ICD-10-CM | POA: Insufficient documentation

## 2021-05-24 DIAGNOSIS — Z95828 Presence of other vascular implants and grafts: Secondary | ICD-10-CM

## 2021-05-24 DIAGNOSIS — C3491 Malignant neoplasm of unspecified part of right bronchus or lung: Secondary | ICD-10-CM

## 2021-05-24 DIAGNOSIS — R911 Solitary pulmonary nodule: Secondary | ICD-10-CM | POA: Diagnosis not present

## 2021-05-24 DIAGNOSIS — C3490 Malignant neoplasm of unspecified part of unspecified bronchus or lung: Secondary | ICD-10-CM | POA: Diagnosis not present

## 2021-05-24 DIAGNOSIS — J439 Emphysema, unspecified: Secondary | ICD-10-CM | POA: Diagnosis not present

## 2021-05-24 DIAGNOSIS — I700 Atherosclerosis of aorta: Secondary | ICD-10-CM | POA: Diagnosis not present

## 2021-05-24 LAB — CBC WITH DIFFERENTIAL (CANCER CENTER ONLY)
Abs Immature Granulocytes: 0.02 10*3/uL (ref 0.00–0.07)
Basophils Absolute: 0 10*3/uL (ref 0.0–0.1)
Basophils Relative: 0 %
Eosinophils Absolute: 0.1 10*3/uL (ref 0.0–0.5)
Eosinophils Relative: 1 %
HCT: 34.8 % — ABNORMAL LOW (ref 36.0–46.0)
Hemoglobin: 11.6 g/dL — ABNORMAL LOW (ref 12.0–15.0)
Immature Granulocytes: 0 %
Lymphocytes Relative: 12 %
Lymphs Abs: 1.1 10*3/uL (ref 0.7–4.0)
MCH: 31.5 pg (ref 26.0–34.0)
MCHC: 33.3 g/dL (ref 30.0–36.0)
MCV: 94.6 fL (ref 80.0–100.0)
Monocytes Absolute: 0.5 10*3/uL (ref 0.1–1.0)
Monocytes Relative: 6 %
Neutro Abs: 7 10*3/uL (ref 1.7–7.7)
Neutrophils Relative %: 81 %
Platelet Count: 362 10*3/uL (ref 150–400)
RBC: 3.68 MIL/uL — ABNORMAL LOW (ref 3.87–5.11)
RDW: 13.2 % (ref 11.5–15.5)
WBC Count: 8.7 10*3/uL (ref 4.0–10.5)
nRBC: 0 % (ref 0.0–0.2)

## 2021-05-24 LAB — CMP (CANCER CENTER ONLY)
ALT: 20 U/L (ref 0–44)
AST: 22 U/L (ref 15–41)
Albumin: 4.3 g/dL (ref 3.5–5.0)
Alkaline Phosphatase: 48 U/L (ref 38–126)
Anion gap: 11 (ref 5–15)
BUN: 11 mg/dL (ref 8–23)
CO2: 22 mmol/L (ref 22–32)
Calcium: 10.1 mg/dL (ref 8.9–10.3)
Chloride: 103 mmol/L (ref 98–111)
Creatinine: 0.93 mg/dL (ref 0.44–1.00)
GFR, Estimated: >60 mL/min (ref >60)
Glucose, Bld: 132 mg/dL — ABNORMAL HIGH (ref 70–99)
Potassium: 3.8 mmol/L (ref 3.5–5.1)
Sodium: 136 mmol/L (ref 135–145)
Total Bilirubin: 0.4 mg/dL (ref 0.3–1.2)
Total Protein: 7.4 g/dL (ref 6.5–8.1)

## 2021-05-24 MED ORDER — HEPARIN SOD (PORK) LOCK FLUSH 100 UNIT/ML IV SOLN: 500.0000 [IU] | Freq: Once | INTRAVENOUS | Status: AC

## 2021-05-24 MED ORDER — SODIUM CHLORIDE 0.9% FLUSH
10.0000 mL | Freq: Once | INTRAVENOUS | Status: AC
Start: 2021-05-24 — End: 2021-05-24
  Administered 2021-05-24: 10 mL

## 2021-05-24 MED ORDER — HEPARIN SOD (PORK) LOCK FLUSH 100 UNIT/ML IV SOLN
INTRAVENOUS | Status: AC
  Administered 2021-05-24: 500 [IU] via INTRAVENOUS
  Filled 2021-05-24: qty 5

## 2021-05-24 MED ORDER — IOHEXOL 350 MG/ML SOLN
60.0000 mL | Freq: Once | INTRAVENOUS | Status: AC | PRN
  Administered 2021-05-24: 60 mL via INTRAVENOUS

## 2021-05-24 NOTE — Progress Notes (Signed)
[name] OFFICE PROGRESS NOTE  Copland, Gay Filler, MD [address] [place] [address]  DIAGNOSIS: Limited stage Small Cell Lung Cancer. She presented with a right upper lobe suprahilar nodule/adenopathy, right paratracheal lymph node, and a hypermetabolic right upper lobe nodule. She was diagnosed in February 2022.   PRIOR THERAPY:   1) Systemic chemotherapy with cisplatin 80 mg per metered squared on days 1, etoposide 100 mg per metered squared on days 1, 2, and 3 IV every 3 weeks with concurrent radiation.  Last dose 02/01/21. Status post 4 cycles.  2) prophylactic cranial irradiation under the care of Dr. Lisbeth Renshaw.  Last treatment on 03/28/2021  CURRENT THERAPY: Observation   INTERVAL HISTORY: Susan Cortez 60 y.o. female returns to the clinic today for a follow-up visit.  The patient was last seen in clinic on 01/31/2021.  At that appointment, the patient had just completed her chemotherapy and concurrent radiation for her limited stage small cell lung cancer.  In the interval since that appointment, the patient underwent her prophylactic cranial irradiation under the care of Dr. Lisbeth Renshaw and Worthy Flank, PA.  This was completed on 03/28/2021.  She had a calvarium lesion noted on repeat neuro-imaging prior to her PCI which was reviewed at the tumor board discussion.  It was felt this was likely a benign lesion such as a hemangioma. She has a follow up MRI of the brain on 06/29/21.   In the interval since her last appointment, she has been feeling fatigued. Especially, 2 weeks following PCI. Her mother also recently passed away so she has been feeling sad lately.    She notes that she is scheduled to see her PCP on 06/13/21. She is concerned she has atrial fibrillation because her mother had atrial fibrillation and she notes she has periods of palpitations lasting 5-10 minutes. She denies palpitations today but her pulse is elevated. She states she had an appointment  at her pain management clinic who also noted she had tachycardia. She did not take any inhalers today.   Today she denies any recent fever, chills, or night sweats.  Her weight is down several pounds since her last appointment 3 months ago. She has boost/ensure but is not using them. Her appetite has not improved since completing treatment..  She denies any nausea, vomiting, diarrhea, or constipation except for her baseline constipation due to pain medication use.  She denies any headache or visual changes.  She denies any chest pain or hemoptysis.  She reports her baseline dyspnea on exertion. Her cough started back up again about 1 month ago. She states she coughs up clear phlegm. Unfortunately, the patient continues to smoke cigarettes averaging 1 ppd. She has tried Nicoderm patches and the gum and it caused hallucinations.  She denies any recent sick contacts or sore throat.  The patient recently had a restaging CT scan performed.  She is here today for evaluation to review her scan results.     MEDICAL HISTORY: Past Medical History:  Diagnosis Date   Allergy    Arthritis    Basal cell carcinoma 2018   Removed from Right neck per patient    Diverticulosis 2013   HLD (hyperlipidemia)    Hx of adenomatous colonic polyps 2013   OAB (overactive bladder)    Pre-diabetes    Small cell lung cancer ([place]) 11/09/2020    ALLERGIES:  is allergic to lyrica [pregabalin], butrans [buprenorphine], chantix [varenicline], diclofenac, meloxicam, tolmetin, and nsaids.  MEDICATIONS:  Current Outpatient Medications  Medication Sig Dispense Refill   acetaminophen-codeine (TYLENOL #4) 300-60 MG tablet Take 1 tablet by mouth daily. 30 tablet 2   albuterol (VENTOLIN HFA) 108 (90 Base) MCG/ACT inhaler Inhale 2 puffs into the lungs every 6 (six) hours as needed for wheezing or shortness of breath. 8 g 6   CALCIUM PO Take 650 mg by mouth daily.     Cholecalciferol (VITAMIN D3) 125 MCG (5000 UT) CAPS Take 5,000  Units by mouth daily.     Cyanocobalamin (B-12) 100 MCG TABS      ketoconazole (NIZORAL) 2 % cream Apply 1 application topically daily. 30 g 0   lidocaine-prilocaine (EMLA) cream Apply 1 application topically as needed. 30 g 0   LORazepam (ATIVAN) 1 MG tablet Take 0.5 tablets (0.5 mg total) by mouth every 8 (eight) hours. Take 1 tablet po 30 minutes prior to MRI or radiation 14 tablet 0   lovastatin (MEVACOR) 20 MG tablet TAKE 1 TABLET(20 MG) BY MOUTH AT BEDTIME (Patient taking differently: Take 20 mg by mouth at bedtime.) 90 tablet 3   memantine (NAMENDA) 10 MG tablet Take 1 tablet (10 mg total) by mouth 2 (two) times daily. 60 tablet 4   Multiple Vitamins-Minerals (MULTIVITAMIN ADULT, MINERALS,) TABS      prochlorperazine (COMPAZINE) 10 MG tablet Take 1 tablet (10 mg total) by mouth every 6 (six) hours as needed. 30 tablet 2   No current facility-administered medications for this visit.    SURGICAL HISTORY:  Past Surgical History:  Procedure Laterality Date   CESAREAN SECTION     X 3   COLONOSCOPY  2013, 2016, 2019   hx colon polyps   DILATION AND CURETTAGE OF UTERUS     FUDUCIAL PLACEMENT Right 10/18/2020   Procedure: PLACEMENT OF FUDUCIAL;  Surgeon: Collene Gobble, MD;  Location: [facility] OR;  Service: Thoracic;  Laterality: Right;   IR IMAGING GUIDED PORT INSERTION  11/29/2020   SHOULDER ARTHROSCOPY WITH OPEN ROTATOR CUFF REPAIR Right 05/2009   TONSILLECTOMY  1977   TUBAL LIGATION     VIDEO BRONCHOSCOPY WITH ENDOBRONCHIAL NAVIGATION N/A 10/18/2020   Procedure: VIDEO BRONCHOSCOPY WITH ENDOBRONCHIAL NAVIGATION;  Surgeon: Collene Gobble, MD;  Location: [facility] OR;  Service: Thoracic;  Laterality: N/A;   VIDEO BRONCHOSCOPY WITH ENDOBRONCHIAL ULTRASOUND N/A 10/18/2020   Procedure: VIDEO BRONCHOSCOPY WITH ENDOBRONCHIAL ULTRASOUND;  Surgeon: Collene Gobble, MD;  Location: [facility] OR;  Service: Thoracic;  Laterality: N/A;   WISDOM TOOTH EXTRACTION      REVIEW OF SYSTEMS:   Review of Systems   Constitutional: Positive for fatigue, appetite change, weight loss.  Negative for chills and fever. HENT: Negative for mouth sores, nosebleeds, sore throat and trouble swallowing.   Eyes: Negative for eye problems and icterus.  Respiratory: Positive for intermittent dyspnea on exertion.  Positive for cough (productive). Negative for  hemoptysis and wheezing.   Cardiovascular: Negative for chest pain and leg swelling.  Gastrointestinal: Negative for abdominal pain, constipation, diarrhea, nausea and vomiting.  Genitourinary: Negative for bladder incontinence, difficulty urinating, dysuria, frequency and hematuria.   Musculoskeletal: Negative for back pain, gait problem, neck pain and neck stiffness.  Skin: Negative for itching and rash.  Neurological: Negative for dizziness, extremity weakness, gait problem, headaches, light-headedness and seizures.  Hematological: Negative for adenopathy. Does not bruise/bleed easily.  Psychiatric/Behavioral: Negative for confusion, depression and sleep disturbance. The patient is not nervous/anxious.     PHYSICAL EXAMINATION:  Blood pressure 120/83, pulse (!) 125,  temperature (!) 95.3 F (35.2 C), temperature source Tympanic, resp. rate 16, height 5' 8.5" (1.74 m), weight 192 lb (87.1 kg), SpO2 99 %.  ECOG PERFORMANCE STATUS: 1  Physical Exam  Constitutional: Oriented to person, place, and time and well-developed, well-nourished, and in no distress. HENT:  Head: Normocephalic and atraumatic.  Mouth/Throat: Oropharynx is clear and moist. No oropharyngeal exudate.  Eyes: Conjunctivae are normal. Right eye exhibits no discharge. Left eye exhibits no discharge. No scleral icterus.  Neck: Normal range of motion. Neck supple.  Cardiovascular: Tachycardia, regular rhythm, normal heart sounds and intact distal pulses.   Pulmonary/Chest: Effort normal and breath sounds normal. No respiratory distress. No wheezes. No rales.  Abdominal: Soft. Bowel sounds are  normal. Exhibits no distension and no mass. There is no tenderness.  Musculoskeletal: Normal range of motion. Exhibits no edema.  Lymphadenopathy:    No cervical adenopathy.  Neurological: Alert and oriented to person, place, and time. Exhibits normal muscle tone. Gait normal. Coordination normal.  Skin: Skin is warm and dry. No rash noted. Not diaphoretic. No erythema. No pallor.  Psychiatric: Mood, memory and judgment normal.  Vitals reviewed.  LABORATORY DATA: Lab Results  Component Value Date   WBC 8.7 05/24/2021   HGB 11.6 (L) 05/24/2021   HCT 34.8 (L) 05/24/2021   MCV 94.6 05/24/2021   PLT 362 05/24/2021      Chemistry      Component Value Date/Time   NA 136 05/24/2021 1110   K 3.8 05/24/2021 1110   CL 103 05/24/2021 1110   CO2 22 05/24/2021 1110   BUN 11 05/24/2021 1110   CREATININE 0.93 05/24/2021 1110   CREATININE 0.65 06/08/2020 1050      Component Value Date/Time   CALCIUM 10.1 05/24/2021 1110   ALKPHOS 48 05/24/2021 1110   AST 22 05/24/2021 1110   ALT 20 05/24/2021 1110   BILITOT 0.4 05/24/2021 1110       RADIOGRAPHIC STUDIES:  CT Chest W Contrast  Result Date: 05/25/2021 CLINICAL DATA:  Lung cancer, chemotherapy complete in June. Radiation therapy complete in August. EXAM: CT CHEST WITH CONTRAST TECHNIQUE: Multidetector CT imaging of the chest was performed during intravenous contrast administration. CONTRAST:  63mL OMNIPAQUE IOHEXOL 350 MG/ML SOLN COMPARISON:  02/16/2021 and PET 11/08/2020. FINDINGS: Cardiovascular: Right IJ Port-A-Cath terminates in the right atrium. Atherosclerotic calcification of the aorta. Heart size normal. No pericardial effusion. Mediastinum/Nodes: No pathologically enlarged mediastinal, hilar or axillary lymph nodes. Esophagus is unremarkable. Lungs/Pleura: Centrilobular and paraseptal emphysema. Scattered fiducial markers in the upper right hemithorax. Largely new peribronchovascular ground-glass and nodularity in the right upper  and right lower lobes. Some associated septal thickening. A few scattered faint nodular densities in the left lung are unchanged. No pleural fluid. Airway is unremarkable. Upper Abdomen: Visualized portions of the liver, gallbladder and right adrenal gland are unremarkable. Nodules in the left adrenal gland measure up to 1.7 cm, similar. Visualized portions of the kidneys, spleen, pancreas, stomach and bowel are grossly unremarkable. Musculoskeletal: Degenerative changes in the spine. No worrisome lytic or sclerotic lesions. IMPRESSION: 1. New peribronchovascular ground-glass, nodularity and slight septal thickening in the right upper and right lower lobes. Findings may represent an infectious bronchiolitis. Lymphangitic carcinomatosis can not be definitively excluded. Appearance is a little unusual for radiation therapy but also not excluded. 2. Left adrenal nodules, previously characterized as adenomas. 3.  Aortic atherosclerosis (ICD10-I70.0). 4.  Emphysema (ICD10-J43.9). Electronically Signed   By: Lorin Picket M.D.   On: 05/25/2021 11:30  ASSESSMENT/PLAN:  This is a very pleasant 60 year old Caucasian female diagnosed with Limited stage (T3, N2, M0) small cell lung cancer presented with right upper lobe lung nodule, hypermetabolic nodule posteriorly in the right upper lobe in addition to right paratracheal lymphadenopathy diagnosed in February 2022.   The patient completed cisplatin 80 mg per metered squared on days 1, etoposide 100 mg/m on days 1, 2, and 3 IV every 3 weeks with concurrent radiation. She is status post 4 cycles. She completed concurrent radiation.  She then underwent prophylactic cranial irradiation which was completed under the care of Dr. Lisbeth Renshaw on 03/28/2021.   She has been on observation since June 2022.   The patient recently had a restaging CT scan performed.  Dr. Julien Nordmann personally and independently reviewed the scan and discussed the results with the patient today.  The  scan showed she mild peribronchovascular ground-glass, nodularity and slight septal thickening in the right upper and right lower lobes. Findings may represent an infectious bronchiolitis. Lymphangitic carcinomatosis can not be definitively excluded. Appearance is a little unusual for radiation therapy but also not excluded.   Dr. Julien Nordmann did not feel strongly that this was concerning for disease progression.   Dr. Julien Nordmann recommends close monitoring and a follow-up CT scan the chest in 3 months.  We will see her back for follow-up visit in 3 months for evaluation and to review her scan results.  The patient was strongly encouraged to quit smoking.  She reportedly has tried patches and NicoDerm in the past which caused hallucinations. Discussed quitting smoking will likely help with her cough. She can also use delsym OTC for her cough. She will continue using anti-histamines and nasal spray as instructed by her pulmonologist. She follows up with Dr. Lamonte Sakai later this week on 06/01/21.  Given the patient's depression due to the recent passing of her mother, I offered to refer the patient to the chaplain.  She politely declined.  The patient has been reporting tachycardia and concern for atrial fibrillation.  Her pulse was 125 bpm in the clinic today.  An EKG was performed which showed a ventricular rate of 114 bpm and it was consistent with sinus tachycardia.  I gave the patient the option of following up with her primary care provider versus a referral to cardiology.  The patient opted to follow with her primary care provider first.  We will reach out to their office and see if they need to evaluate her sooner than her scheduled appointment on the 18th to initiate medication management.  She will continue to follow with radiation oncology for routine neuroimaging.   Due to her significant weight loss, I encouraged the patient to start drinking boost or Ensure 2 times per day.  She has plenty at  home and has just not use them yet.  The patient was advised to call immediately if she has any concerning symptoms in the interval. The patient voices understanding of current disease status and treatment options and is in agreement with the current care plan. All questions were answered. The patient knows to call the clinic with any problems, questions or concerns. We can certainly see the patient much sooner if necessary    Orders Placed This Encounter  Procedures   CT Chest W Contrast    Standing Status:   Future    Standing Expiration Date:   05/29/2022    Order Specific Question:   If indicated for the ordered procedure, I authorize the administration of contrast media per  Radiology protocol    Answer:   Yes    Order Specific Question:   Preferred imaging location?    Answer:   [facility]   CBC with Differential ([place] Only)    Standing Status:   Future    Standing Expiration Date:   05/29/2022   CMP ([facility] only)    Standing Status:   Future    Standing Expiration Date:   05/29/2022   EKG 12-Lead    tachycardia      Valen Gillison L Analeigh Aries, PA-C 05/29/21  ADDENDUM: Hematology/Oncology Attending: I had a face-to-face encounter with the patient today.  I reviewed her record, lab and scan and recommended her care plan.  This is a very pleasant 60 years old white female diagnosed with limited stage (T3, N2, M0) small cell lung cancer presented with right upper lobe lung nodule in addition to hypermetabolic nodule posterior in the right upper lobe and right paratracheal lymph nodes in February 2022.  The patient underwent systemic chemotherapy with cisplatin and etoposide concurrent with radiotherapy followed by prophylactic cranial irradiation under the care of Dr. Lisbeth Renshaw. The patient is currently on observation and she is feeling fine today with no concerning complaints except for tachycardia. She had repeat CT scan of the chest performed recently.  I  personally and independently reviewed the scan images and discussed the results with the patient today. Her scan showed no concerning findings for disease progression but there was some residual inflammatory process in the right lung that need close observation. I recommended for the patient to continue on observation with repeat CT scan of the chest in 3 months. For the dry cough, she will continue on Delsym for cough as needed. For the tachycardia we ordered EKG today that showed sinus tachycardia.  The patient will follow with her primary care physician but she may need a referral to cardiology for evaluation. The patient was advised to call immediately if she has any other concerning symptoms in the interval. The total time spent in the appointment was 30 minutes. Disclaimer: This note was dictated with voice recognition software. Similar sounding words can inadvertently be transcribed and may be missed upon review. Eilleen Kempf, MD 05/29/21

## 2021-05-29 ENCOUNTER — Encounter (HOSPITAL_BASED_OUTPATIENT_CLINIC_OR_DEPARTMENT_OTHER): Payer: Self-pay | Admitting: Emergency Medicine

## 2021-05-29 ENCOUNTER — Emergency Department (HOSPITAL_BASED_OUTPATIENT_CLINIC_OR_DEPARTMENT_OTHER): Payer: Medicare Other

## 2021-05-29 ENCOUNTER — Other Ambulatory Visit: Payer: Self-pay

## 2021-05-29 ENCOUNTER — Emergency Department (HOSPITAL_BASED_OUTPATIENT_CLINIC_OR_DEPARTMENT_OTHER)
Admission: EM | Admit: 2021-05-29 | Discharge: 2021-05-29 | Disposition: A | Discharge status: Home / Self Care | Payer: Medicare Other | Attending: Emergency Medicine | Admitting: Emergency Medicine

## 2021-05-29 ENCOUNTER — Other Ambulatory Visit: Payer: Self-pay | Admitting: Radiation Therapy

## 2021-05-29 ENCOUNTER — Inpatient Hospital Stay: Payer: Medicare Other | Attending: Physician Assistant | Admitting: Physician Assistant

## 2021-05-29 ENCOUNTER — Ambulatory Visit (INDEPENDENT_AMBULATORY_CARE_PROVIDER_SITE_OTHER): Payer: Medicare Other | Admitting: Medical

## 2021-05-29 ENCOUNTER — Encounter: Payer: Self-pay | Admitting: Physician Assistant

## 2021-05-29 ENCOUNTER — Encounter: Payer: Self-pay | Admitting: Family Medicine

## 2021-05-29 VITALS — BP 120/83 | HR 125 | Temp 95.3°F | Resp 16 | Ht 68.5 in | Wt 192.0 lb

## 2021-05-29 VITALS — BP 115/85 | HR 119 | Temp 98.2°F | Resp 20 | Ht 68.5 in | Wt 193.8 lb

## 2021-05-29 DIAGNOSIS — Z85828 Personal history of other malignant neoplasm of skin: Secondary | ICD-10-CM | POA: Insufficient documentation

## 2021-05-29 DIAGNOSIS — J449 Chronic obstructive pulmonary disease, unspecified: Secondary | ICD-10-CM | POA: Diagnosis not present

## 2021-05-29 DIAGNOSIS — C3491 Malignant neoplasm of unspecified part of right bronchus or lung: Secondary | ICD-10-CM

## 2021-05-29 DIAGNOSIS — F17210 Nicotine dependence, cigarettes, uncomplicated: Secondary | ICD-10-CM | POA: Diagnosis not present

## 2021-05-29 DIAGNOSIS — E876 Hypokalemia: Secondary | ICD-10-CM | POA: Insufficient documentation

## 2021-05-29 DIAGNOSIS — R000 Tachycardia, unspecified: Secondary | ICD-10-CM | POA: Diagnosis not present

## 2021-05-29 DIAGNOSIS — I700 Atherosclerosis of aorta: Secondary | ICD-10-CM | POA: Diagnosis not present

## 2021-05-29 DIAGNOSIS — Z85118 Personal history of other malignant neoplasm of bronchus and lung: Secondary | ICD-10-CM | POA: Diagnosis not present

## 2021-05-29 DIAGNOSIS — R002 Palpitations: Secondary | ICD-10-CM | POA: Diagnosis not present

## 2021-05-29 DIAGNOSIS — C7931 Secondary malignant neoplasm of brain: Secondary | ICD-10-CM

## 2021-05-29 DIAGNOSIS — Z79899 Other long term (current) drug therapy: Secondary | ICD-10-CM | POA: Insufficient documentation

## 2021-05-29 LAB — COMPREHENSIVE METABOLIC PANEL
ALT: 20 U/L (ref 0–44)
AST: 26 U/L (ref 15–41)
Albumin: 4.4 g/dL (ref 3.5–5.0)
Alkaline Phosphatase: 49 U/L (ref 38–126)
Anion gap: 10 (ref 5–15)
BUN: 12 mg/dL (ref 8–23)
CO2: 27 mmol/L (ref 22–32)
Calcium: 10 mg/dL (ref 8.9–10.3)
Chloride: 100 mmol/L (ref 98–111)
Creatinine, Ser: 0.96 mg/dL (ref 0.44–1.00)
GFR, Estimated: >60 mL/min (ref >60)
Glucose, Bld: 133 mg/dL — ABNORMAL HIGH (ref 70–99)
Potassium: 3.2 mmol/L — ABNORMAL LOW (ref 3.5–5.1)
Sodium: 137 mmol/L (ref 135–145)
Total Bilirubin: 0.3 mg/dL (ref 0.3–1.2)
Total Protein: 7.3 g/dL (ref 6.5–8.1)

## 2021-05-29 LAB — CBC WITH DIFFERENTIAL/PLATELET
Abs Immature Granulocytes: 0.02 10*3/uL (ref 0.00–0.07)
Basophils Absolute: 0 10*3/uL (ref 0.0–0.1)
Basophils Relative: 1 %
Eosinophils Absolute: 0.1 10*3/uL (ref 0.0–0.5)
Eosinophils Relative: 1 %
HCT: 36.2 % (ref 36.0–46.0)
Hemoglobin: 12.1 g/dL (ref 12.0–15.0)
Immature Granulocytes: 0 %
Lymphocytes Relative: 20 %
Lymphs Abs: 1.6 10*3/uL (ref 0.7–4.0)
MCH: 31.8 pg (ref 26.0–34.0)
MCHC: 33.4 g/dL (ref 30.0–36.0)
MCV: 95 fL (ref 80.0–100.0)
Monocytes Absolute: 0.6 10*3/uL (ref 0.1–1.0)
Monocytes Relative: 8 %
Neutro Abs: 5.6 10*3/uL (ref 1.7–7.7)
Neutrophils Relative %: 70 %
Platelets: 364 10*3/uL (ref 150–400)
RBC: 3.81 MIL/uL — ABNORMAL LOW (ref 3.87–5.11)
RDW: 13.3 % (ref 11.5–15.5)
WBC: 7.9 10*3/uL (ref 4.0–10.5)
nRBC: 0 % (ref 0.0–0.2)

## 2021-05-29 LAB — URINALYSIS, ROUTINE W REFLEX MICROSCOPIC
Glucose, UA: NEGATIVE mg/dL
Hgb urine dipstick: NEGATIVE
Ketones, ur: 15 mg/dL — AB
Nitrite: NEGATIVE
Protein, ur: 30 mg/dL — AB
Specific Gravity, Urine: 1.03 (ref 1.005–1.030)
pH: 6 (ref 5.0–8.0)

## 2021-05-29 LAB — URINALYSIS, MICROSCOPIC (REFLEX)

## 2021-05-29 LAB — TSH: TSH: 2.615 u[IU]/mL (ref 0.350–4.500)

## 2021-05-29 LAB — TROPONIN I (HIGH SENSITIVITY)
Troponin I (High Sensitivity): 4 ng/L (ref <18)
Troponin I (High Sensitivity): 4 ng/L (ref <18)

## 2021-05-29 MED ORDER — SODIUM CHLORIDE 0.9 % IV BOLUS
1000.0000 mL | Freq: Once | INTRAVENOUS | Status: AC
  Administered 2021-05-29: 1000 mL via INTRAVENOUS

## 2021-05-29 MED ORDER — POTASSIUM CHLORIDE CRYS ER 20 MEQ PO TBCR
40.0000 meq | EXTENDED_RELEASE_TABLET | Freq: Once | ORAL | Status: AC
  Administered 2021-05-29: 40 meq via ORAL
  Filled 2021-05-29: qty 2

## 2021-05-29 MED ORDER — IOHEXOL 350 MG/ML SOLN
75.0000 mL | Freq: Once | INTRAVENOUS | Status: AC | PRN
  Administered 2021-05-29: 75 mL via INTRAVENOUS

## 2021-05-29 NOTE — Discharge Instructions (Addendum)
You were seen in the ED for an elevated heart rate. Your cardiac markers were normal. Your CT chest showed no evidence of Pulmonary Embolism. Your Heart rate improved with IV fluids which indicates that you were likely dehydrated. Please follow up with your PCP regarding the findings from your CT scan.   CT chest findings:  Increasing areas of peribronchovascular nodularity within the  right upper lobe, which may represent an infectious or inflammatory  process. Lymphangitic carcinomatosis not excluded.

## 2021-05-29 NOTE — Patient Instructions (Addendum)
Recent tachycardia up to 130 today in office with dyspnea on exertion. Hx of CA. Some of some concern for possible PE or potential cardiac cause. Discussed with her pcp.  Dr. Lorelei Pont called down stairs to update on presentation. May need CT angio and cardiac enzymes. ED will determine work up.  Follow up here to be determined after ED evaluation.

## 2021-05-29 NOTE — ED Notes (Signed)
Pt return from CT.

## 2021-05-29 NOTE — ED Provider Notes (Signed)
[facility] EMERGENCY DEPARTMENT Provider Note   CSN: [id number] Arrival date & time: 05/29/21  1457     History Chief Complaint  Patient presents with   Tachycardia    Susan Cortez is a 60 y.o. female.  With past medical history of small cell carcinoma status postradiation and in remission as of August 2022, COPD, prediabetes, dyslipidemia.  Patient presents the emergency department after being seen by her PCP with findings of tachycardia to 130's.  Due to patient's history of cancer, PCP sent her here for possible pulmonary embolism and cardiac work-up.  Patient states that over the past few months when she gets up to go to the bathroom or has exertion she sometimes has palpitations feels like her heart is racing.  She has a family history of A. fib but this is never been diagnosed.  She denies any chest pain, shortness of breath, vision changes, weakness or numbness in any of her extremities, abdominal pain, nausea, vomiting, fevers, chills, diarrhea, dysuria, hematuria, dizziness, Leg swelling.  Denies recent bedrest or surgery.  Denies history of blood clot.  She has had history of CVA that was found on MRI at one point.  She states that she has been found to be tachycardic at a few of her recent doctors appointments over the past few months.  She does not think this is a new finding.  HPI     Past Medical History:  Diagnosis Date   Allergy    Arthritis    Basal cell carcinoma 2018   Removed from Right neck per patient    Diverticulosis 2013   HLD (hyperlipidemia)    Hx of adenomatous colonic polyps 2013   OAB (overactive bladder)    Pre-diabetes    Small cell lung cancer ([place]) 11/09/2020    Patient Active Problem List   Diagnosis Date Noted   Tachycardia 05/29/2021   Cough 03/28/2021   Port-A-Cath in place 01/02/2021   Chemotherapy induced neutropenia ([place]) 01/02/2021   COPD, mild ([place]) 12/19/2020   Encounter for antineoplastic chemotherapy 12/05/2020    Goals of care, counseling/discussion 11/09/2020   Small cell carcinoma of right lung ([place]) 11/09/2020   Mediastinal adenopathy 10/18/2020   Pulmonary nodule 1 cm or greater in diameter 06/28/2020   Encounter for screening for lung cancer 01/04/2020   Porokeratosis 12/02/2019   Plantar flexed metatarsal bone of left foot 12/02/2019   Plantar flexed metatarsal bone of right foot 12/02/2019   Prediabetes 05/31/2019   Dyslipidemia 03/01/2018   De Quervain's disease (tenosynovitis) 03/28/2017   Chronic bilateral low back pain without sciatica 03/28/2017   Sebaceous hyperplasia of face 06/26/2016   Chronic right shoulder pain 01/02/2016   Adenoma of left adrenal gland 12/26/2015   Intermittent claudication ([place]) 12/26/2015   Rotator cuff (capsule) sprain 03/26/2014   Spinal stenosis in cervical region 03/26/2014   Chronic pain 03/26/2014    Past Surgical History:  Procedure Laterality Date   CESAREAN SECTION     X 3   COLONOSCOPY  2013, 2016, 2019   hx colon polyps   DILATION AND CURETTAGE OF UTERUS     FUDUCIAL PLACEMENT Right 10/18/2020   Procedure: PLACEMENT OF FUDUCIAL;  Surgeon: Collene Gobble, MD;  Location: [facility] OR;  Service: Thoracic;  Laterality: Right;   IR IMAGING GUIDED PORT INSERTION  11/29/2020   SHOULDER ARTHROSCOPY WITH OPEN ROTATOR CUFF REPAIR Right 05/2009   TONSILLECTOMY  1977   TUBAL LIGATION     VIDEO BRONCHOSCOPY WITH ENDOBRONCHIAL  NAVIGATION N/A 10/18/2020   Procedure: VIDEO BRONCHOSCOPY WITH ENDOBRONCHIAL NAVIGATION;  Surgeon: Collene Gobble, MD;  Location: [facility] OR;  Service: Thoracic;  Laterality: N/A;   VIDEO BRONCHOSCOPY WITH ENDOBRONCHIAL ULTRASOUND N/A 10/18/2020   Procedure: VIDEO BRONCHOSCOPY WITH ENDOBRONCHIAL ULTRASOUND;  Surgeon: Collene Gobble, MD;  Location: [facility] OR;  Service: Thoracic;  Laterality: N/A;   WISDOM TOOTH EXTRACTION       OB History     Gravida  4   Para  3   Term      Preterm      AB  1   Living  3      SAB  1   IAB       Ectopic      Multiple      Live Births  3           Family History  Problem Relation Age of Onset   Diabetes Father    Heart disease Father    Hyperlipidemia Father    Colon cancer Neg Hx    Colon polyps Neg Hx    Esophageal cancer Neg Hx    Rectal cancer Neg Hx    Stomach cancer Neg Hx     Social History   Tobacco Use   Smoking status: Every Day    Packs/day: 1.00    Years: 45.00    Pack years: 45.00    Types: Cigarettes   Smokeless tobacco: Never   Tobacco comments:    1 smoked per day 03/28/21 ARJ   Vaping Use   Vaping Use: Former  Substance Use Topics   Alcohol use: No   Drug use: No    Home Medications Prior to Admission medications   Medication Sig Start Date End Date Taking? Authorizing Provider  acetaminophen-codeine (TYLENOL #4) 300-60 MG tablet Take 1 tablet by mouth daily. 04/17/21   Bayard Hugger, NP  albuterol (VENTOLIN HFA) 108 (90 Base) MCG/ACT inhaler Inhale 2 puffs into the lungs every 6 (six) hours as needed for wheezing or shortness of breath. 12/19/20   Collene Gobble, MD  CALCIUM PO Take 650 mg by mouth daily.    [provider]  Cholecalciferol (VITAMIN D3) 125 MCG (5000 UT) CAPS Take 5,000 Units by mouth daily.    [provider]  Cyanocobalamin (B-12) 100 MCG TABS  01/30/21   [provider]  ketoconazole (NIZORAL) 2 % cream Apply 1 application topically daily. 04/13/21   Colon Branch, MD  lidocaine-prilocaine (EMLA) cream Apply 1 application topically as needed. 11/21/20   Heilingoetter, Cassandra L, PA-C  LORazepam (ATIVAN) 1 MG tablet Take 0.5 tablets (0.5 mg total) by mouth every 8 (eight) hours. Take 1 tablet po 30 minutes prior to MRI or radiation 02/22/21   Hayden Pedro, PA-C  lovastatin (MEVACOR) 20 MG tablet TAKE 1 TABLET(20 MG) BY MOUTH AT BEDTIME Patient taking differently: Take 20 mg by mouth at bedtime. 06/09/20   Copland, Gay Filler, MD  memantine (NAMENDA) 10 MG tablet Take 1 tablet (10 mg  total) by mouth 2 (two) times daily. 02/22/21   Hayden Pedro, PA-C  Multiple Vitamins-Minerals (MULTIVITAMIN ADULT, MINERALS,) TABS  01/30/21   [provider]  prochlorperazine (COMPAZINE) 10 MG tablet Take 1 tablet (10 mg total) by mouth every 6 (six) hours as needed. 11/09/20   Heilingoetter, Cassandra L, PA-C    Allergies    Lyrica [pregabalin], Butrans [buprenorphine], Chantix [varenicline], Diclofenac, Meloxicam, Tolmetin, and Nsaids  Review of Systems  Review of Systems  Constitutional:  Negative for chills and fever.  HENT:  Negative for congestion, rhinorrhea and sore throat.   Eyes:  Negative for visual disturbance.  Respiratory:  Negative for cough, chest tightness and shortness of breath.   Cardiovascular:  Positive for palpitations. Negative for chest pain and leg swelling.  Gastrointestinal:  Negative for abdominal pain, blood in stool, constipation, diarrhea, nausea and vomiting.  Genitourinary:  Negative for dysuria, flank pain and hematuria.  Musculoskeletal:  Negative for back pain.  Skin:  Negative for rash and wound.  Neurological:  Negative for dizziness, syncope, speech difficulty, weakness, light-headedness, numbness and headaches.  Psychiatric/Behavioral:  Negative for confusion.   All other systems reviewed and are negative.  Physical Exam Updated Vital Signs BP 112/86 (BP Location: Right Arm)   Pulse 90   Temp 98 F (36.7 C) (Oral)   Resp 14   Ht 5' 8.5" (1.74 m)   Wt 87.5 kg   LMP  (LMP Unknown)   SpO2 96%   BMI 28.92 kg/m   Physical Exam Vitals and nursing note reviewed.  Constitutional:      General: She is not in acute distress.    Appearance: Normal appearance. She is not ill-appearing, toxic-appearing or diaphoretic.  HENT:     Head: Normocephalic and atraumatic.     Nose: No nasal deformity, congestion or rhinorrhea.     Mouth/Throat:     Lips: Pink. No lesions.     Mouth: Mucous membranes are moist. No injury,  lacerations, oral lesions or angioedema.     Pharynx: Oropharynx is clear. Uvula midline. No pharyngeal swelling, oropharyngeal exudate, posterior oropharyngeal erythema or uvula swelling.  Eyes:     General: Gaze aligned appropriately. No scleral icterus.       Right eye: No discharge.        Left eye: No discharge.     Conjunctiva/sclera: Conjunctivae normal.     Right eye: Right conjunctiva is not injected. No exudate or hemorrhage.    Left eye: Left conjunctiva is not injected. No exudate or hemorrhage.    Pupils: Pupils are equal, round, and reactive to light.  Cardiovascular:     Rate and Rhythm: Regular rhythm. Tachycardia present.     Pulses: Normal pulses.          Radial pulses are 2+ on the right side and 2+ on the left side.       Dorsalis pedis pulses are 2+ on the right side and 2+ on the left side.     Heart sounds: Normal heart sounds, S1 normal and S2 normal. Heart sounds not distant. No murmur heard.   No friction rub. No gallop. No S3 or S4 sounds.  Pulmonary:     Effort: Pulmonary effort is normal. No accessory muscle usage or respiratory distress.     Breath sounds: Normal breath sounds. No stridor. No wheezing, rhonchi or rales.  Chest:     Chest wall: No tenderness.  Abdominal:     General: Abdomen is flat. Bowel sounds are normal. There is no distension.     Palpations: Abdomen is soft. There is no mass or pulsatile mass.     Tenderness: There is no abdominal tenderness. There is no guarding or rebound.  Musculoskeletal:     Cervical back: Neck supple.     Right lower leg: No edema.     Left lower leg: No edema.  Skin:    General: Skin is warm and dry.  Coloration: Skin is not jaundiced or pale.     Findings: No bruising, erythema, lesion or rash.  Neurological:     General: No focal deficit present.     Mental Status: She is alert and oriented to person, place, and time.     GCS: GCS eye subscore is 4. GCS verbal subscore is 5. GCS motor subscore is 6.   Psychiatric:        Mood and Affect: Mood normal.        Behavior: Behavior normal. Behavior is cooperative.    ED Results / Procedures / Treatments   Labs (all labs ordered are listed, but only abnormal results are displayed) Labs Reviewed  COMPREHENSIVE METABOLIC PANEL - Abnormal; Notable for the following components:      Result Value   Potassium 3.2 (*)    Glucose, Bld 133 (*)    All other components within normal limits  CBC WITH DIFFERENTIAL/PLATELET - Abnormal; Notable for the following components:   RBC 3.81 (*)    All other components within normal limits  URINALYSIS, ROUTINE W REFLEX MICROSCOPIC - Abnormal; Notable for the following components:   Bilirubin Urine SMALL (*)    Ketones, ur 15 (*)    Protein, ur 30 (*)    Leukocytes,Ua MODERATE (*)    All other components within normal limits  URINALYSIS, MICROSCOPIC (REFLEX) - Abnormal; Notable for the following components:   Bacteria, UA FEW (*)    All other components within normal limits  TSH  TROPONIN I (HIGH SENSITIVITY)  TROPONIN I (HIGH SENSITIVITY)    EKG None  Radiology CT Angio Chest PE W/Cm &/Or Wo Cm  Result Date: 05/29/2021 CLINICAL DATA:  PE suspected, high prob. History of right small cell lung cancer status post chemoradiation EXAM: CT ANGIOGRAPHY CHEST WITH CONTRAST TECHNIQUE: Multidetector CT imaging of the chest was performed using the standard protocol during bolus administration of intravenous contrast. Multiplanar CT image reconstructions and MIPs were obtained to evaluate the vascular anatomy. CONTRAST:  56mL OMNIPAQUE IOHEXOL 350 MG/ML SOLN COMPARISON:  CT 05/24/2021, 08/18/2019. FINDINGS: Cardiovascular: Satisfactory opacification of the pulmonary arteries to the segmental level. No evidence of pulmonary embolism. Thoracic aorta is normal in course and caliber. Scattered atherosclerotic calcification of the thoracic aorta. Normal heart size. No pericardial effusion. Right-sided chest port in  place with distal tip terminating at the superior cavoatrial junction. Mediastinum/Nodes: No enlarged mediastinal, hilar, or axillary lymph nodes. Thyroid gland, trachea, and esophagus demonstrate no significant findings. Lungs/Pleura: Mild emphysematous lung changes. Multiple right upper lobe fiduciary markers. Increasing areas of peribronchovascular nodularity within the right upper lobe (see series 5, images 23-26 and 34-37). No new airspace consolidation in the left lung. No pleural effusion or pneumothorax. Upper Abdomen: Stable left adrenal gland nodules, previously characterized as adenomas on 08/18/2019. No acute findings within the included upper abdomen Musculoskeletal: No acute osseous abnormality. No suspicious bone lesion. No chest wall abnormality. Review of the MIP images confirms the above findings. IMPRESSION: 1. No evidence of pulmonary embolism. 2. Increasing areas of peribronchovascular nodularity within the right upper lobe, which may represent an infectious or inflammatory process. Lymphangitic carcinomatosis not excluded. Aortic Atherosclerosis (ICD10-I70.0) and Emphysema (ICD10-J43.9). Electronically Signed   By: Davina Poke D.O.   On: 05/29/2021 17:49    Procedures Procedures   Medications Ordered in ED Medications  sodium chloride 0.9 % bolus 1,000 mL (0 mLs Intravenous Stopped 05/29/21 1851)  iohexol (OMNIPAQUE) 350 MG/ML injection 75 mL (75 mLs Intravenous Contrast Given 05/29/21  1708)  potassium chloride SA (KLOR-CON) CR tablet 40 mEq (40 mEq Oral Given 05/29/21 1659)    ED Course  I have reviewed the triage vital signs and the nursing notes.  Pertinent labs & imaging results that were available during my care of the patient were reviewed by me and considered in my medical decision making (see chart for details).  Clinical Course as of 05/29/21 2332  Mon May 29, 2021  1651 Mild hypokalemia - will replace in ED [GL]    Clinical Course User Index [GL] Sreshta Cressler,  Adora Fridge, PA-C   MDM Rules/Calculators/A&P                          This is a 60 year old patient who presents to the department on request of her PCP with new onset of tachycardia.  PCP wanted cardiac work-up and PE rule out.  On arrival, patient's heart rate is 116 and regular, she is afebrile, blood pressure normal, other vital signs normal.  Patient is well-appearing and nontoxic.  She comes in with no complaints other than occasional palpitations over the past 2 months.  Cardiac exam with tachycardia that is regular with no murmurs.  Lungs are CTA.  No leg swelling on exam.  No abdominal tenderness to palpation.  Patient does not appear to be dehydrated.  PERC positive.  Well score with moderate risk of PE.  Ddx includes infection, ACS, PE, thyroid disease, dehydration. It is possible that patient has had tachycardia for a while and this is not a new onset.   Given patient's history of cancer and tachycardia will obtain CTA chest for PE rule out.  Also obtain CBC, CMP, troponin, TSH, UA.  Will give 1 L of IV fluids.  CMP with hypokalemia.  Replaced in the ED.  Normal kidney function.  Normal electrolytes other than potassium.  No anemia.  No leukocytosis.  Urine not suggestive of UTI.  Troponins were normal.  CTA chest with no evidence of pulmonary embolism.  EKG with sinus tachycardia.  CT did show increased areas of peribronchovascular nodularity within the right upper lobe.  Read suggest that this could represent an infectious or inflammatory process however does not this disclude lymphangitic carcinomatosis.  He sent these results to patient's PCP who will forward them to her oncologist.  On reassessment, patient's tachycardia had improved after IV fluids.  No emergent cause of patient's tachycardia was found and given that it improved with IV fluids suspect that she was dehydrated.  She can follow-up with her PCP in the next week for further work-up if needed.   Final Clinical Impression(s) /  ED Diagnoses Final diagnoses:  Tachycardia    Rx / DC Orders ED Discharge Orders     None        Adolphus Birchwood, PA-C 05/29/21 2332    Teressa Lower, MD 05/30/21 [id number]

## 2021-05-29 NOTE — Progress Notes (Signed)
Subjective:    Patient ID: Susan Cortez, female    DOB: December 03, 1960, 60 y.o.   MRN: [id number]  HPI  Pt in for evaluation.  Pt has mild tachycardia. Pt has been feeling some fast heart rate. She stats feeling some mild dyspea with activity for a year and half.   Since April appears to have some intermittent low level/mild tachycardia.   Today when she arrived to cancer center they decided to do ekg based on palpitation and shortness of breath.    Pt blood work last week/5 days ago stable. No significant anemia. She was not dehydrated.  History of small cell lung cancer with metastatis to brain.   "The patient completed cisplatin 80 mg per metered squared on days 1, etoposide 100 mg/m on days 1, 2, and 3 IV every 3 weeks with concurrent radiation. She is status post 4 cycles. She completed concurrent radiation.   She then underwent prophylactic cranial irradiation which was completed under the care of Dr. Lisbeth Renshaw on 03/28/2021. "   Brief short walk down hall hr increased to 130.  Pulse Readings from Last 3 Encounters:  05/29/21 (!) 119  05/29/21 (!) 125  04/17/21 (!) 106       Review of Systems  Constitutional:  Negative for chills, fatigue and fever.  HENT:  Negative for congestion, ear discharge and ear pain.   Respiratory:  Positive for shortness of breath. Negative for cough, chest tightness and wheezing.   Cardiovascular:  Negative for chest pain and palpitations.       Tachydardia.  Gastrointestinal:  Negative for abdominal pain.  Musculoskeletal:  Negative for back pain.  Hematological:  Negative for adenopathy. Does not bruise/bleed easily.  Psychiatric/Behavioral:  Negative for behavioral problems and confusion.     Past Medical History:  Diagnosis Date   Allergy    Arthritis    Basal cell carcinoma 2018   Removed from Right neck per patient    Diverticulosis 2013   HLD (hyperlipidemia)    Hx of adenomatous colonic polyps 2013   OAB (overactive bladder)     Pre-diabetes    Small cell lung cancer ([place]) 11/09/2020     Social History   Socioeconomic History   Marital status: Divorced    Spouse name: Not on file   Number of children: Not on file   Years of education: Not on file   Highest education level: Not on file  Occupational History   Not on file  Tobacco Use   Smoking status: Every Day    Packs/day: 1.00    Years: 45.00    Pack years: 45.00    Types: Cigarettes   Smokeless tobacco: Never   Tobacco comments:    1 smoked per day 03/28/21 [facility]   Vaping Use   Vaping Use: Former  Substance and Sexual Activity   Alcohol use: No   Drug use: No   Sexual activity: Not on file    Comment: Tubal Ligation  Other Topics Concern   Not on file  Social History Narrative   Not on file   Social Determinants of Health   Financial Resource Strain: Low Risk    Difficulty of Paying Living Expenses: Not hard at all  Food Insecurity: No Food Insecurity   Worried About Charity fundraiser in the Last Year: Never true   [place] in the Last Year: Never true  Transportation Needs: No Transportation Needs   Lack of Transportation (Medical): No  Lack of Transportation (Non-Medical): No  Physical Activity: Inactive   Days of Exercise per Week: 0 days   Minutes of Exercise per Session: 0 min  Stress: Stress Concern Present   Feeling of Stress : To some extent  Social Connections: Socially Isolated   Frequency of Communication with Friends and Family: More than three times a week   Frequency of Social Gatherings with Friends and Family: More than three times a week   Attends Religious Services: Never   Marine scientist or Organizations: No   Attends Archivist Meetings: Never   Marital Status: Divorced  Human resources officer Violence: Not At Risk   Fear of Current or Ex-Partner: No   Emotionally Abused: No   Physically Abused: No   Sexually Abused: No    Past Surgical History:  Procedure Laterality Date   CESAREAN  SECTION     X 3   COLONOSCOPY  2013, 2016, 2019   hx colon polyps   DILATION AND CURETTAGE OF UTERUS     FUDUCIAL PLACEMENT Right 10/18/2020   Procedure: PLACEMENT OF FUDUCIAL;  Surgeon: Collene Gobble, MD;  Location: [facility];  Service: Thoracic;  Laterality: Right;   IR IMAGING GUIDED PORT INSERTION  11/29/2020   SHOULDER ARTHROSCOPY WITH OPEN ROTATOR CUFF REPAIR Right 05/2009   TONSILLECTOMY  1977   TUBAL LIGATION     VIDEO BRONCHOSCOPY WITH ENDOBRONCHIAL NAVIGATION N/A 10/18/2020   Procedure: VIDEO BRONCHOSCOPY WITH ENDOBRONCHIAL NAVIGATION;  Surgeon: Collene Gobble, MD;  Location: [facility];  Service: Thoracic;  Laterality: N/A;   VIDEO BRONCHOSCOPY WITH ENDOBRONCHIAL ULTRASOUND N/A 10/18/2020   Procedure: VIDEO BRONCHOSCOPY WITH ENDOBRONCHIAL ULTRASOUND;  Surgeon: Collene Gobble, MD;  Location: [facility];  Service: Thoracic;  Laterality: N/A;   WISDOM TOOTH EXTRACTION      Family History  Problem Relation Age of Onset   Diabetes Father    Heart disease Father    Hyperlipidemia Father    Colon cancer Neg Hx    Colon polyps Neg Hx    Esophageal cancer Neg Hx    Rectal cancer Neg Hx    Stomach cancer Neg Hx     Allergies  Allergen Reactions   Lyrica [Pregabalin] Swelling    Headache,severe eye pain, swelling   Butrans [Buprenorphine] Itching    Itching and redness at the area of the patch.   Chantix [Varenicline] Other (See Comments)    Strange dreams/hallucinations   Diclofenac Swelling   Meloxicam Swelling   Tolmetin Other (See Comments)    Stomach pain   Nsaids Other (See Comments)    Stomach pain    Current Outpatient Medications on File Prior to Visit  Medication Sig Dispense Refill   acetaminophen-codeine (TYLENOL #4) 300-60 MG tablet Take 1 tablet by mouth daily. 30 tablet 2   albuterol (VENTOLIN HFA) 108 (90 Base) MCG/ACT inhaler Inhale 2 puffs into the lungs every 6 (six) hours as needed for wheezing or shortness of breath. 8 g 6   CALCIUM PO Take 650 mg by mouth  daily.     Cholecalciferol (VITAMIN D3) 125 MCG (5000 UT) CAPS Take 5,000 Units by mouth daily.     Cyanocobalamin (B-12) 100 MCG TABS      ketoconazole (NIZORAL) 2 % cream Apply 1 application topically daily. 30 g 0   lidocaine-prilocaine (EMLA) cream Apply 1 application topically as needed. 30 g 0   LORazepam (ATIVAN) 1 MG tablet Take 0.5 tablets (0.5 mg total) by mouth every 8 (  eight) hours. Take 1 tablet po 30 minutes prior to MRI or radiation 14 tablet 0   lovastatin (MEVACOR) 20 MG tablet TAKE 1 TABLET(20 MG) BY MOUTH AT BEDTIME (Patient taking differently: Take 20 mg by mouth at bedtime.) 90 tablet 3   memantine (NAMENDA) 10 MG tablet Take 1 tablet (10 mg total) by mouth 2 (two) times daily. 60 tablet 4   Multiple Vitamins-Minerals (MULTIVITAMIN ADULT, MINERALS,) TABS      prochlorperazine (COMPAZINE) 10 MG tablet Take 1 tablet (10 mg total) by mouth every 6 (six) hours as needed. 30 tablet 2   No current facility-administered medications on file prior to visit.    BP 115/85   Pulse (!) 119   Temp 98.2 F (36.8 C)   Resp 20   Ht 5' 8.5" (1.74 m)   Wt 193 lb 12.8 oz (87.9 kg)   LMP  (LMP Unknown)   SpO2 97%   BMI 29.04 kg/m       Objective:   Physical Exam  General- No acute distress. Pleasant patient. Neck- Full range of motion, no jvd Lungs- Clear, even and unlabored. Heart- regular rate and rhythm. Neurologic- CNII- XII grossly intact.  Bilateral lower ex- no pedal edema. Negative homans signs.        Assessment & Plan:   Patient Instructions  Recent tachycardia up to 130 today in office with dyspnea on exertion. Hx of CA. Some of some concern for possible PE or potential cardiac cause. Discussed with her pcp.  Dr. Lorelei Pont called down stairs to update on presentation. May need CT angio and cardiac enzymes. ED will determine work up.  Follow up here to be determined after ED evaluation.   Mackie Pai, PA-C

## 2021-05-29 NOTE — ED Triage Notes (Signed)
Pt reports she was sent by Dr. Edilia Bo for elevated HR to rule out blood clot; HR triage 121

## 2021-05-29 NOTE — ED Notes (Signed)
Pt NAD, a/ox4. Pt states she was upstairs at an oncology appt and her doctor sent her to ED for r/o PR secondary to tachycardia. Pt states she has had tachycardia for years and has lung CA. Pt denies CP, SOB, dizziness. VSS.

## 2021-05-29 NOTE — ED Notes (Signed)
Pt NAD, a/ox4. Pt verbalizes understanding of all DC and f/u instructions. All questions answered. Pt walks with steady gait to lobby at DC.  ? ?

## 2021-05-29 NOTE — ED Notes (Signed)
Patient transported to CT 

## 2021-05-30 ENCOUNTER — Telehealth: Payer: Self-pay | Admitting: Physician Assistant

## 2021-05-30 DIAGNOSIS — Z85828 Personal history of other malignant neoplasm of skin: Secondary | ICD-10-CM | POA: Diagnosis not present

## 2021-05-30 DIAGNOSIS — L821 Other seborrheic keratosis: Secondary | ICD-10-CM | POA: Diagnosis not present

## 2021-05-30 DIAGNOSIS — D225 Melanocytic nevi of trunk: Secondary | ICD-10-CM | POA: Diagnosis not present

## 2021-05-30 NOTE — Telephone Encounter (Signed)
Sch per 10/3 los,pt aware

## 2021-06-01 ENCOUNTER — Encounter: Payer: Self-pay | Admitting: Emergency Medicine

## 2021-06-01 ENCOUNTER — Ambulatory Visit (INDEPENDENT_AMBULATORY_CARE_PROVIDER_SITE_OTHER): Payer: Medicare Other | Admitting: Emergency Medicine

## 2021-06-01 ENCOUNTER — Other Ambulatory Visit: Payer: Self-pay

## 2021-06-01 ENCOUNTER — Other Ambulatory Visit: Payer: Self-pay | Admitting: *Deleted

## 2021-06-01 DIAGNOSIS — R053 Chronic cough: Secondary | ICD-10-CM | POA: Diagnosis not present

## 2021-06-01 DIAGNOSIS — J449 Chronic obstructive pulmonary disease, unspecified: Secondary | ICD-10-CM | POA: Diagnosis not present

## 2021-06-01 DIAGNOSIS — Z720 Tobacco use: Secondary | ICD-10-CM | POA: Diagnosis not present

## 2021-06-01 DIAGNOSIS — C3491 Malignant neoplasm of unspecified part of right bronchus or lung: Secondary | ICD-10-CM | POA: Diagnosis not present

## 2021-06-01 DIAGNOSIS — Z95828 Presence of other vascular implants and grafts: Secondary | ICD-10-CM

## 2021-06-01 MED ORDER — DOXYCYCLINE HYCLATE 100 MG PO TABS: 100.0000 mg | ORAL_TABLET | Freq: Two times a day (BID) | ORAL | 0 refills | Status: AC

## 2021-06-01 MED ORDER — PANTOPRAZOLE SODIUM 40 MG PO TBEC: 40.0000 mg | DELAYED_RELEASE_TABLET | Freq: Every day | ORAL | 2 refills | Status: DC

## 2021-06-01 MED ORDER — ATROVENT HFA 17 MCG/ACT IN AERS: 2.0000 | INHALATION_SPRAY | RESPIRATORY_TRACT | 2 refills | Status: DC | PRN

## 2021-06-01 NOTE — Assessment & Plan Note (Signed)
With contribution of her chronic rhinitis which did not seem to respond to loratadine or fluticasone nasal spray.  We will try Atrovent nasal Joanne Chars to see if this gives her more relief.  Consider occult GERD and will try 1 month of PPI.  If she benefits we can continue it going forward.  I reviewed her CT scan and I do not see any clear evidence for bronchitis although she has some nonspecific peribronchovascular changes in her right lower lobe (the prior nodules, small cell lung cancer appear to be better).  Reasonable to treat her empirically with doxycycline to see if she gets benefit.  We will have to follow the right lower lobe with serial imaging, next scan is planned by oncology in 3 months.

## 2021-06-01 NOTE — Progress Notes (Signed)
   Subjective:    Patient ID: Susan Cortez, female    DOB: 31-Dec-1960, 60 y.o.   MRN: [id number]  HPI  ROV 06/01/21 -- Kampbell is 44 and has a history of continued tobacco use, COPD, small cell lung cancer that was diagnosed in 2022.  She underwent chemoradiation, completed in August.  She is dealing with chronic cough in the setting of chronic rhinitis.  I saw her in August and started loratadine and fluticasone nasal spray.  She denies any GERD symptoms at the time. No change in her nasal congestion, cough. The cough is worst in the am, clears clear/white mucous. She denies any dyspnea unless she is carrying groceries, etc.   CT scan of the chest 05/24/2021 reviewed by me showed some peribronchovascular groundglass nodularity and septal thickening in the right upper and lower lobes, question etiology consider infectious bronchiolitis versus radiation change versus lymphangitic spread.  CT-PA chest was done 05/29/2021 when she was noted to have tachycardia, reviewed by me showed no evidence of PE, increased areas of peribronchovascular nodularity in the right upper lobe, question inflammatory or infectious process.  Lymphangitic spread not excluded.   Review of Systems As per HPI     Objective:   Physical Exam Vitals:   06/01/21 1155  BP: 108/72  Pulse: (!) 118  Temp: 98.2 F (36.8 C)  SpO2: 96%  Weight: 192 lb 12.8 oz (87.5 kg)  Height: 5' 8.5" (1.74 m)   Gen: Pleasant, well-nourished, in no distress,  normal affect  ENT: No lesions,  mouth clear,  oropharynx clear, no postnasal drip  Neck: No JVD, no stridor  Lungs: No use of accessory muscles, no crackles or wheezing on normal respiration, no wheeze on forced expiration  Cardiovascular: RRR, heart sounds normal, no murmur or gallops, no peripheral edema  Musculoskeletal: No deformities, no cyanosis or clubbing  Neuro: alert, awake, non focal  Skin: Warm, no lesions or rash      Assessment & Plan:  Cough With  contribution of her chronic rhinitis which did not seem to respond to loratadine or fluticasone nasal spray.  We will try Atrovent nasal Joanne Chars to see if this gives her more relief.  Consider occult GERD and will try 1 month of PPI.  If she benefits we can continue it going forward.  I reviewed her CT scan and I do not see any clear evidence for bronchitis although she has some nonspecific peribronchovascular changes in her right lower lobe (the prior nodules, small cell lung cancer appear to be better).  Reasonable to treat her empirically with doxycycline to see if she gets benefit.  We will have to follow the right lower lobe with serial imaging, next scan is planned by oncology in 3 months.  COPD, mild ([place]) She has albuterol available to use as needed.  No significant obstruction on her PFT.  We could consider addition of scheduled bronchodilator therapy going forward.  Small cell carcinoma of right lung (HCC) Responded to chemoradiation, now on observation.  Her most recent CT scans have some nonspecific right lower lobe peribronchovascular change but not convincing to me that this is malignancy or radiation pneumonitis.  Continue to follow with imaging.  Time spent 45 minutes  Baltazar Apo, MD, PhD 06/01/2021, 12:34 PM [facility] [phone] or if no answer [phone]

## 2021-06-01 NOTE — Assessment & Plan Note (Signed)
Responded to chemoradiation, now on observation.  Her most recent CT scans have some nonspecific right lower lobe peribronchovascular change but not convincing to me that this is malignancy or radiation pneumonitis.  Continue to follow with imaging.

## 2021-06-01 NOTE — Assessment & Plan Note (Signed)
She has albuterol available to use as needed.  No significant obstruction on her PFT.  We could consider addition of scheduled bronchodilator therapy going forward.

## 2021-06-01 NOTE — Patient Instructions (Signed)
Please work on decreasing your smoking.  When you are ready to try to stop let us know and we will work with you, help you. We reviewed your CT scan of the chest today.  I do not see any clear evidence for progression of cancer, radiation inflammation or infection We will try treating with doxycycline 100 mg twice a day for 7 days in case there is any evolving bronchitis Stop fluticasone nasal spray and loratadine Try starting Atrovent nasal spray, 2 sprays each nostril 2-3 times daily when you need it for congestion and drainage. Start pantoprazole 40 mg once daily.  Take this medication 1 hour around food.  Take it for the next month.  If your cough improves on this medication then please call us and let us know so that we can continue it. Get your repeat CT scan in 3 months as planned by oncology. Follow with Dr Lamonte Sakai in 3 months or sooner if you have any problems.

## 2021-06-01 NOTE — Assessment & Plan Note (Signed)
Discussed cessation with her today. She is contemplating it

## 2021-06-09 DIAGNOSIS — H3563 Retinal hemorrhage, bilateral: Secondary | ICD-10-CM | POA: Diagnosis not present

## 2021-06-09 NOTE — Patient Instructions (Addendum)
It was great to see you again today, I will be in touch with your labs asap We will look for any apparent cause of your fatigue Pap pending I will set you up to see cardiology about your rapid heart rate  Try taking metoprolol xl- 1/2 tablet- daily for your tachycardia.  Let me know if helpful.  If your BP is going too low- or if you feel lightheaded or dizzy- stop use

## 2021-06-09 NOTE — Progress Notes (Signed)
[name] [facility] [address] [place], [place] [address] [phone]  Date:  06/14/2021   Name:  Susan Cortez   DOB:  04/21/1961   MRN:  [id number]  PCP:  Darreld Mclean, MD    Chief Complaint: Annual Exam (Concerns/ questions: 1. elevated heart rate, 2. should she have an esophageal dilation? 3. Doxy makes her nauseous- could this be added to her allergy list?/Flu shot today: declines/Covid: 2 doses/Pap: due- does not see GYN/)   History of Present Illness:  Susan Cortez is a 60 y.o. very pleasant female patient who presents with the following:  Patient seen today for physical exam- - History of cervical spinal stenosis, lower back pain, intermittent claudication, dyslipidemia, prediabetes Most recent visit with myself about 1 year ago-unfortunately at that time her lung cancer screening CT revealed a suspicious finding, she was diagnosed with small cell carcinoma of the right lung February of this year She has completed chemo and radiation at this time-currently on observation She has questionable metastases to the skull She is feeling really tired.  She did brain radiation 10 treatments- finished in August. This really knocked her out She has less appetite  She has lost some weight which she is pleased about  Wt Readings from Last 3 Encounters:  06/14/21 189 lb 12.8 oz (86.1 kg)  06/01/21 192 lb 12.8 oz (87.5 kg)  05/29/21 193 lb (87.5 kg)    I saw her in conjunction with one of our physician assistants, Mackie Pai earlier this month for tachycardia.  We did a CT angiogram which was negative for pulmonary embolism  She continues to feel like her heart is racing  She does have a heart doctor No CP  She is not SOB   She had a CMP, CBC and thyroid earlier this month   She is taking lovastatin still- no se She stopped taking myrbetriq as not helping her   Pap screening- will do today  Mammogram  up-to-date Colon cancer screening done in February Flu shot- she declines today, notes the flu shot tends to make her sick  CMP, CBC, TSH up-to-date Can offer lipid, A1c, vitamin D  Patient Active Problem List   Diagnosis Date Noted   Tobacco use 06/01/2021   Tachycardia 05/29/2021   Cough 03/28/2021   Port-A-Cath in place 01/02/2021   Chemotherapy induced neutropenia ([place]) 01/02/2021   COPD, mild ([place]) 12/19/2020   Encounter for antineoplastic chemotherapy 12/05/2020   Goals of care, counseling/discussion 11/09/2020   Small cell carcinoma of right lung ([place]) 11/09/2020   Mediastinal adenopathy 10/18/2020   Pulmonary nodule 1 cm or greater in diameter 06/28/2020   Encounter for screening for lung cancer 01/04/2020   Porokeratosis 12/02/2019   Plantar flexed metatarsal bone of left foot 12/02/2019   Plantar flexed metatarsal bone of right foot 12/02/2019   Prediabetes 05/31/2019   Dyslipidemia 03/01/2018   De Quervain's disease (tenosynovitis) 03/28/2017   Chronic bilateral low back pain without sciatica 03/28/2017   Sebaceous hyperplasia of face 06/26/2016   Chronic right shoulder pain 01/02/2016   Adenoma of left adrenal gland 12/26/2015   Intermittent claudication ([place]) 12/26/2015   Rotator cuff (capsule) sprain 03/26/2014   Spinal stenosis in cervical region 03/26/2014   Chronic pain 03/26/2014    Past Medical History:  Diagnosis Date   Allergy    Arthritis    Basal cell carcinoma 2018   Removed from Right neck per  patient    Diverticulosis 2013   HLD (hyperlipidemia)    Hx of adenomatous colonic polyps 2013   OAB (overactive bladder)    Pre-diabetes    Small cell lung cancer ([place]) 11/09/2020    Past Surgical History:  Procedure Laterality Date   CESAREAN SECTION     X 3   COLONOSCOPY  2013, 2016, 2019   hx colon polyps   DILATION AND CURETTAGE OF UTERUS     FUDUCIAL PLACEMENT Right 10/18/2020   Procedure: PLACEMENT OF FUDUCIAL;  Surgeon: Collene Gobble,  MD;  Location: [facility] OR;  Service: Thoracic;  Laterality: Right;   IR IMAGING GUIDED PORT INSERTION  11/29/2020   SHOULDER ARTHROSCOPY WITH OPEN ROTATOR CUFF REPAIR Right 05/2009   TONSILLECTOMY  1977   TUBAL LIGATION     VIDEO BRONCHOSCOPY WITH ENDOBRONCHIAL NAVIGATION N/A 10/18/2020   Procedure: VIDEO BRONCHOSCOPY WITH ENDOBRONCHIAL NAVIGATION;  Surgeon: Collene Gobble, MD;  Location: [facility] OR;  Service: Thoracic;  Laterality: N/A;   VIDEO BRONCHOSCOPY WITH ENDOBRONCHIAL ULTRASOUND N/A 10/18/2020   Procedure: VIDEO BRONCHOSCOPY WITH ENDOBRONCHIAL ULTRASOUND;  Surgeon: Collene Gobble, MD;  Location: [facility] OR;  Service: Thoracic;  Laterality: N/A;   WISDOM TOOTH EXTRACTION      Social History   Tobacco Use   Smoking status: Every Day    Packs/day: 1.00    Years: 45.00    Pack years: 45.00    Types: Cigarettes   Smokeless tobacco: Never   Tobacco comments:    1 smoked per day 03/28/21 ARJ 1 pack 06/01/21 BR  Vaping Use   Vaping Use: Former  Substance Use Topics   Alcohol use: No   Drug use: No    Family History  Problem Relation Age of Onset   Diabetes Father    Heart disease Father    Hyperlipidemia Father    Colon cancer Neg Hx    Colon polyps Neg Hx    Esophageal cancer Neg Hx    Rectal cancer Neg Hx    Stomach cancer Neg Hx     Allergies  Allergen Reactions   Lyrica [Pregabalin] Swelling    Headache,severe eye pain, swelling   Butrans [Buprenorphine] Itching    Itching and redness at the area of the patch.   Chantix [Varenicline] Other (See Comments)    Strange dreams/hallucinations   Diclofenac Swelling   Meloxicam Swelling   Tolmetin Other (See Comments)    Stomach pain   Nsaids Other (See Comments)    Stomach pain    Medication list has been reviewed and updated.  Current Outpatient Medications on File Prior to Visit  Medication Sig Dispense Refill   acetaminophen-codeine (TYLENOL #4) 300-60 MG tablet Take 1 tablet by mouth daily. 30 tablet 2   albuterol  (VENTOLIN HFA) 108 (90 Base) MCG/ACT inhaler Inhale 2 puffs into the lungs every 6 (six) hours as needed for wheezing or shortness of breath. 8 g 6   CALCIUM PO Take 650 mg by mouth daily.     Cholecalciferol (VITAMIN D3) 125 MCG (5000 UT) CAPS Take 5,000 Units by mouth daily.     Cyanocobalamin (B-12) 100 MCG TABS      ipratropium (ATROVENT HFA) 17 MCG/ACT inhaler Inhale 2 puffs into the lungs every 4 (four) hours as needed for wheezing. 1 each 2   ketoconazole (NIZORAL) 2 % cream Apply 1 application topically daily. 30 g 0   lidocaine-prilocaine (EMLA) cream Apply 1 application topically as needed. 30 g 0  LORazepam (ATIVAN) 1 MG tablet Take 0.5 tablets (0.5 mg total) by mouth every 8 (eight) hours. Take 1 tablet po 30 minutes prior to MRI or radiation 14 tablet 0   lovastatin (MEVACOR) 20 MG tablet TAKE 1 TABLET(20 MG) BY MOUTH AT BEDTIME (Patient taking differently: Take 20 mg by mouth at bedtime.) 90 tablet 3   memantine (NAMENDA) 10 MG tablet Take 1 tablet (10 mg total) by mouth 2 (two) times daily. 60 tablet 4   Multiple Vitamins-Minerals (MULTIVITAMIN ADULT, MINERALS,) TABS      pantoprazole (PROTONIX) 40 MG tablet Take 1 tablet (40 mg total) by mouth daily. 30 tablet 2   prochlorperazine (COMPAZINE) 10 MG tablet Take 1 tablet (10 mg total) by mouth every 6 (six) hours as needed. 30 tablet 2   No current facility-administered medications on file prior to visit.    Review of Systems:  As per HPI- otherwise negative.  Pulse Readings from Last 3 Encounters:  06/14/21 (!) 107  06/01/21 (!) 118  05/29/21 90      Physical Examination: Vitals:   06/14/21 0820  BP: 118/74  Pulse: (!) 107  Resp: 18  Temp: 98.7 F (37.1 C)  SpO2: 98%   Vitals:   06/14/21 0820  Weight: 189 lb 12.8 oz (86.1 kg)  Height: 5\' 9"  (1.753 m)   Body mass index is 28.03 kg/m. Ideal Body Weight: Weight in (lb) to have BMI = 25: 168.9  GEN: no acute distress.  Obese, looks well s/p chemo, her  normal self except hair has not yet grown back HEENT: Atraumatic, Normocephalic. Bilateral TM wnl, oropharynx normal.  PEERL,EOMI.   Ears and Nose: No external deformity. CV: RRR- tachy, No M/G/R. No JVD. No thrill. No extra heart sounds. PULM: CTA B, no wheezes, crackles, rhonchi. No retractions. No resp. distress. No accessory muscle use. ABD: S, NT, ND, +BS. No rebound. No HSM. EXTR: No c/c/e PSYCH: Normally interactive. Conversant.  Pelvic: normal, no vaginal lesions or discharge. Uterus normal, no CMT, no adnexal tendereness or masses   Assessment and Plan: Physical exam  Prediabetes  Intermittent claudication (HCC)  Dyslipidemia - Plan: Lipid panel  Small cell carcinoma of right lung (HCC)  Fatigue, unspecified type - Plan: VITAMIN D 25 Hydroxy (Vit-D Deficiency, Fractures), Vitamin B12, Ferritin  Screening for cervical cancer - Plan: Cytology - PAP  Paroxysmal supraventricular tachycardia ([place]) - Plan: Ambulatory referral to Cardiology  Screening for diabetes mellitus - Plan: Hemoglobin A1c  Tachycardia - Plan: metoprolol succinate (TOPROL-XL) 25 MG 24 hr tablet  Physical exam today.  Encouraged healthy diet and exercise routine Will plan further follow- up pending labs.  Signed Lamar Blinks, MD  Received her labs as below, message to patient  Results for orders placed or performed in visit on 06/14/21  Lipid panel  Result Value Ref Range   Cholesterol 168 0 - 200 mg/dL   Triglycerides 145.0 0.0 - 149.0 mg/dL   HDL 51.40 >39.00 mg/dL   VLDL 29.0 0.0 - 40.0 mg/dL   LDL Cholesterol 88 0 - 99 mg/dL   Total CHOL/HDL Ratio 3    NonHDL 117.06   Hemoglobin A1c  Result Value Ref Range   Hgb A1c MFr Bld 5.9 4.6 - 6.5 %  VITAMIN D 25 Hydroxy (Vit-D Deficiency, Fractures)  Result Value Ref Range   VITD 62.08 30.00 - 100.00 ng/mL  Vitamin B12  Result Value Ref Range   Vitamin B-12 647 211 - 911 pg/mL  Ferritin  Result Value Ref  Range   Ferritin 300.1 (H)  10.0 - 291.0 ng/mL

## 2021-06-13 ENCOUNTER — Other Ambulatory Visit: Payer: Self-pay

## 2021-06-14 ENCOUNTER — Encounter: Payer: Self-pay | Admitting: Family Medicine

## 2021-06-14 ENCOUNTER — Other Ambulatory Visit (HOSPITAL_COMMUNITY)
Admission: RE | Admit: 2021-06-14 | Discharge: 2021-06-14 | Disposition: A | Discharge status: Home / Self Care | Payer: Medicare Other | Source: Ambulatory Visit | Attending: Family Medicine | Admitting: Family Medicine

## 2021-06-14 ENCOUNTER — Ambulatory Visit (INDEPENDENT_AMBULATORY_CARE_PROVIDER_SITE_OTHER): Payer: Medicare Other | Admitting: Family Medicine

## 2021-06-14 VITALS — BP 118/74 | HR 107 | Temp 98.7°F | Resp 18 | Ht 69.0 in | Wt 189.8 lb

## 2021-06-14 DIAGNOSIS — R5383 Other fatigue: Secondary | ICD-10-CM

## 2021-06-14 DIAGNOSIS — I471 Supraventricular tachycardia: Secondary | ICD-10-CM | POA: Diagnosis not present

## 2021-06-14 DIAGNOSIS — Z0000 Encounter for general adult medical examination without abnormal findings: Secondary | ICD-10-CM | POA: Diagnosis not present

## 2021-06-14 DIAGNOSIS — Z01419 Encounter for gynecological examination (general) (routine) without abnormal findings: Secondary | ICD-10-CM | POA: Insufficient documentation

## 2021-06-14 DIAGNOSIS — Z1151 Encounter for screening for human papillomavirus (HPV): Secondary | ICD-10-CM | POA: Diagnosis not present

## 2021-06-14 DIAGNOSIS — R7303 Prediabetes: Secondary | ICD-10-CM | POA: Diagnosis not present

## 2021-06-14 DIAGNOSIS — I739 Peripheral vascular disease, unspecified: Secondary | ICD-10-CM | POA: Diagnosis not present

## 2021-06-14 DIAGNOSIS — C3491 Malignant neoplasm of unspecified part of right bronchus or lung: Secondary | ICD-10-CM

## 2021-06-14 DIAGNOSIS — R000 Tachycardia, unspecified: Secondary | ICD-10-CM | POA: Diagnosis not present

## 2021-06-14 DIAGNOSIS — E785 Hyperlipidemia, unspecified: Secondary | ICD-10-CM

## 2021-06-14 DIAGNOSIS — Z131 Encounter for screening for diabetes mellitus: Secondary | ICD-10-CM

## 2021-06-14 DIAGNOSIS — Z124 Encounter for screening for malignant neoplasm of cervix: Secondary | ICD-10-CM

## 2021-06-14 LAB — LIPID PANEL
Cholesterol: 168 mg/dL (ref 0–200)
HDL: 51.4 mg/dL (ref >39.00)
LDL Cholesterol: 88 mg/dL (ref 0–99)
NonHDL: 117.06
Total CHOL/HDL Ratio: 3
Triglycerides: 145 mg/dL (ref 0.0–149.0)
VLDL: 29 mg/dL (ref 0.0–40.0)

## 2021-06-14 LAB — FERRITIN: Ferritin: 300.1 ng/mL — ABNORMAL HIGH (ref 10.0–291.0)

## 2021-06-14 LAB — HEMOGLOBIN A1C: Hgb A1c MFr Bld: 5.9 % (ref 4.6–6.5)

## 2021-06-14 LAB — VITAMIN D 25 HYDROXY (VIT D DEFICIENCY, FRACTURES): VITD: 62.08 ng/mL (ref 30.00–100.00)

## 2021-06-14 LAB — VITAMIN B12: Vitamin B-12: 647 pg/mL (ref 211–911)

## 2021-06-14 MED ORDER — METOPROLOL SUCCINATE ER 25 MG PO TB24: 12.5000 mg | ORAL_TABLET | Freq: Every day | ORAL | 3 refills | Status: DC

## 2021-06-15 ENCOUNTER — Encounter: Payer: Self-pay | Admitting: Family Medicine

## 2021-06-15 LAB — CYTOLOGY - PAP
Comment: NEGATIVE
Diagnosis: NEGATIVE
High risk HPV: NEGATIVE

## 2021-06-26 ENCOUNTER — Other Ambulatory Visit: Payer: Self-pay | Admitting: Physician Assistant

## 2021-06-26 ENCOUNTER — Other Ambulatory Visit: Payer: Self-pay | Admitting: Radiation Oncology

## 2021-06-26 DIAGNOSIS — C3491 Malignant neoplasm of unspecified part of right bronchus or lung: Secondary | ICD-10-CM

## 2021-06-26 DIAGNOSIS — F40240 Claustrophobia: Secondary | ICD-10-CM

## 2021-06-27 ENCOUNTER — Encounter: Payer: Self-pay | Admitting: Internal Medicine

## 2021-06-29 ENCOUNTER — Ambulatory Visit
Admission: RE | Admit: 2021-06-29 | Discharge: 2021-06-29 | Disposition: A | Discharge status: Home / Self Care | Payer: Medicare Other | Source: Ambulatory Visit | Attending: Radiation Oncology | Admitting: Radiation Oncology

## 2021-06-29 DIAGNOSIS — C729 Malignant neoplasm of central nervous system, unspecified: Secondary | ICD-10-CM | POA: Diagnosis not present

## 2021-06-29 DIAGNOSIS — C3490 Malignant neoplasm of unspecified part of unspecified bronchus or lung: Secondary | ICD-10-CM | POA: Diagnosis not present

## 2021-06-29 DIAGNOSIS — C7931 Secondary malignant neoplasm of brain: Secondary | ICD-10-CM

## 2021-06-29 MED ORDER — SODIUM CHLORIDE 0.9% FLUSH
10.0000 mL | INTRAVENOUS | Status: DC | PRN
  Administered 2021-06-29: 10 mL via INTRAVENOUS

## 2021-06-29 MED ORDER — GADOBENATE DIMEGLUMINE 529 MG/ML IV SOLN
19.0000 mL | Freq: Once | INTRAVENOUS | Status: AC | PRN
  Administered 2021-06-29: 19 mL via INTRAVENOUS

## 2021-06-29 MED ORDER — HEPARIN SOD (PORK) LOCK FLUSH 100 UNIT/ML IV SOLN
500.0000 [IU] | Freq: Once | INTRAVENOUS | Status: AC
  Administered 2021-06-29: 500 [IU] via INTRAVENOUS

## 2021-07-03 ENCOUNTER — Inpatient Hospital Stay: Payer: Medicare Other | Attending: Physician Assistant

## 2021-07-04 ENCOUNTER — Ambulatory Visit: Payer: Self-pay | Admitting: Radiation Oncology

## 2021-07-10 ENCOUNTER — Other Ambulatory Visit: Payer: Self-pay

## 2021-07-10 ENCOUNTER — Ambulatory Visit
Admission: RE | Admit: 2021-07-10 | Discharge: 2021-07-10 | Disposition: A | Discharge status: Home / Self Care | Payer: Medicare Other | Source: Ambulatory Visit | Attending: Radiation Oncology | Admitting: Radiation Oncology

## 2021-07-10 DIAGNOSIS — Z08 Encounter for follow-up examination after completed treatment for malignant neoplasm: Secondary | ICD-10-CM | POA: Diagnosis not present

## 2021-07-10 DIAGNOSIS — C3491 Malignant neoplasm of unspecified part of right bronchus or lung: Secondary | ICD-10-CM | POA: Insufficient documentation

## 2021-07-10 DIAGNOSIS — C3411 Malignant neoplasm of upper lobe, right bronchus or lung: Secondary | ICD-10-CM | POA: Diagnosis not present

## 2021-07-10 NOTE — Progress Notes (Signed)
Radiation Oncology         [phone] ________________________________  Outpatient Follow Up- Conducted via telephone due to current COVID-19 concerns for limiting patient exposure  I spoke with the patient to conduct this consult visit via telephone to spare the patient unnecessary potential exposure in the healthcare setting during the current COVID-19 pandemic. The patient was notified in advance and was offered a [place] meeting to allow for face to face communication but unfortunately reported that they did not have the appropriate resources/technology to support such a visit and instead preferred to proceed with a telephone visit.   ________________________________  Name: Susan Cortez        MRN: [id number]  Date of Service: 07/10/2021 DOB: [birth year]  DD:UKGURKY, Gay Filler, MD  Copland, Gay Filler, MD     REFERRING PHYSICIAN: Copland, Gay Filler, MD   DIAGNOSIS: The encounter diagnosis was Small cell carcinoma of right lung ([place]).   HISTORY OF PRESENT ILLNESS: Susan Cortez is a 60 y.o. female with a history of limited stage small cell carcinoma of the right upper lobe involving suprahilar nodule/adenopathy and right paractracheal adenopathy. A biopsy and cytology on 10/18/20 showed malignant cells in the right upper lobe brushings and fine-needle aspirate, atypical cells were seen in the 11 R node by fine-needle aspiration and malignant cells consistent with small cell carcinoma were seen in the level 4R and 10 R nodes as well.  She received chemoradiation which she completed on  01/04/21. She also had repeat MRI of the brain on 03/07/21 that was negative for paranchyemal disease. She did have a lesion in the calvarium that was originally read as possibly metastatic, but after further discussion with neuroradiology, this was felt to be a benign appearing hemangioma. She went on to proceed with whole brain radiation with Namenda. She completed radiation to the brain on 03/28/21. She had a  repeat MRI brain on 06/29/21 that showed no active brain disease and stable benign lesion in the left calvarium. She's contacted today to discuss her imaging results. Of note her case was discussed in brain oncology conference last week while I was out of the office. Surveillance was recommended.  On review of systems, the patient reports that she is doing well overall. She denies any headaches, visual or speech changes, confusion, changes in stability or gait. She also denies any chest pain, shortness of breath. She is however having neuropathic pain and sometimes swelling of both feet. She developed these symptoms in August 2022, rather than during chemotherapy. She sees improvement in her swelling if she props her feet up.     PREVIOUS RADIATION THERAPY:   03/15/2021 through 03/28/2021 Site Technique Total Dose (Gy) Dose per Fx (Gy) Completed Fx Beam Energies  Brain: Brain Whole Brain Complex 25/25 2.5 10/10 6X   11/21/2020 through 01/04/2021 Site Technique Total Dose (Gy) Dose per Fx (Gy) Completed Fx Beam Energies  Lung, Right: Lung_Rt 3D 60/60 2 30/30 6X, 10X  Lung, Right: Lung_Rt_Bst 3D 6/6 2 3/3 6X, 10X    PAST MEDICAL HISTORY:  Past Medical History:  Diagnosis Date   Allergy    Arthritis    Basal cell carcinoma 2018   Removed from Right neck per patient    Diverticulosis 2013   HLD (hyperlipidemia)    Hx of adenomatous colonic polyps 2013   OAB (overactive bladder)    Pre-diabetes    Small cell lung cancer ([place]) 11/09/2020       PAST SURGICAL HISTORY: Past Surgical  History:  Procedure Laterality Date   CESAREAN SECTION     X 3   COLONOSCOPY  2013, 2016, 2019   hx colon polyps   DILATION AND CURETTAGE OF UTERUS     FUDUCIAL PLACEMENT Right 10/18/2020   Procedure: PLACEMENT OF FUDUCIAL;  Surgeon: Collene Gobble, MD;  Location: [facility];  Service: Thoracic;  Laterality: Right;   IR IMAGING GUIDED PORT INSERTION  11/29/2020   SHOULDER ARTHROSCOPY WITH OPEN ROTATOR CUFF REPAIR  Right 05/2009   TONSILLECTOMY  1977   TUBAL LIGATION     VIDEO BRONCHOSCOPY WITH ENDOBRONCHIAL NAVIGATION N/A 10/18/2020   Procedure: VIDEO BRONCHOSCOPY WITH ENDOBRONCHIAL NAVIGATION;  Surgeon: Collene Gobble, MD;  [facility];  Service: Thoracic;  Laterality: N/A;   VIDEO BRONCHOSCOPY WITH ENDOBRONCHIAL ULTRASOUND N/A 10/18/2020   Procedure: VIDEO BRONCHOSCOPY WITH ENDOBRONCHIAL ULTRASOUND;  Surgeon: Collene Gobble, MD;  [facility];  Service: Thoracic;  Laterality: N/A;   WISDOM TOOTH EXTRACTION       FAMILY HISTORY:  Family History  Problem Relation Age of Onset   Diabetes Father    Heart disease Father    Hyperlipidemia Father    Colon cancer Neg Hx    Colon polyps Neg Hx    Esophageal cancer Neg Hx    Rectal cancer Neg Hx    Stomach cancer Neg Hx      SOCIAL HISTORY:  reports that she has been smoking cigarettes. She has a 45.00 pack-year smoking history. She has never used smokeless tobacco. She reports that she does not drink alcohol and does not use drugs. The patient is divorced and lives in [place]. She is originally from [place] and enjoys traveling home to [place] periodically to see friends and family.     ALLERGIES: Lyrica [pregabalin], Butrans [buprenorphine], Chantix [varenicline], Diclofenac, Meloxicam, Tolmetin, and Nsaids   MEDICATIONS:  Current Outpatient Medications  Medication Sig Dispense Refill   acetaminophen-codeine (TYLENOL #4) 300-60 MG tablet Take 1 tablet by mouth daily. 30 tablet 2   albuterol (VENTOLIN HFA) 108 (90 Base) MCG/ACT inhaler Inhale 2 puffs into the lungs every 6 (six) hours as needed for wheezing or shortness of breath. 8 g 6   CALCIUM PO Take 650 mg by mouth daily.     Cholecalciferol (VITAMIN D3) 125 MCG (5000 UT) CAPS Take 5,000 Units by mouth daily.     Cyanocobalamin (B-12) 100 MCG TABS      ipratropium (ATROVENT HFA) 17 MCG/ACT inhaler Inhale 2 puffs into the lungs every 4 (four) hours as needed for wheezing. 1 each  2   ketoconazole (NIZORAL) 2 % cream Apply 1 application topically daily. 30 g 0   lidocaine-prilocaine (EMLA) cream APPLY 1 APPLICATION TOPICALLY AS NEEDED. 30 g 0   LORazepam (ATIVAN) 1 MG tablet TAKE 0.5 TABLET BY MOUTH EVERY 8 HOURS. TAKE 1 TABLET 30 MINUTES PRIOR TO MRI OR RADIATION 14 tablet 0   lovastatin (MEVACOR) 20 MG tablet TAKE 1 TABLET(20 MG) BY MOUTH AT BEDTIME (Patient taking differently: Take 20 mg by mouth at bedtime.) 90 tablet 3   memantine (NAMENDA) 10 MG tablet Take 1 tablet (10 mg total) by mouth 2 (two) times daily. 60 tablet 4   metoprolol succinate (TOPROL-XL) 25 MG 24 hr tablet Take 0.5 tablets (12.5 mg total) by mouth daily. 45 tablet 3   Multiple Vitamins-Minerals (MULTIVITAMIN ADULT, MINERALS,) TABS      pantoprazole (PROTONIX) 40 MG tablet Take 1 tablet (40 mg total) by mouth daily.  30 tablet 2   prochlorperazine (COMPAZINE) 10 MG tablet Take 1 tablet (10 mg total) by mouth every 6 (six) hours as needed. 30 tablet 2   No current facility-administered medications for this encounter.        PHYSICAL EXAM:  Unable to assess  due to encounter type.    ECOG = 0  0 - Asymptomatic (Fully active, able to carry on all predisease activities without restriction)  1 - Symptomatic but completely ambulatory (Restricted in physically strenuous activity but ambulatory and able to carry out work of a light or sedentary nature. For example, light housework, office work)  2 - Symptomatic, <50% in bed during the day (Ambulatory and capable of all self care but unable to carry out any work activities. Up and about more than 50% of waking hours)  3 - Symptomatic, >50% in bed, but not bedbound (Capable of only limited self-care, confined to bed or chair 50% or more of waking hours)  4 - Bedbound (Completely disabled. Cannot carry on any self-care. Totally confined to bed or chair)  5 - Death   Eustace Pen MM, Creech RH, Tormey DC, et al. [phone]). "Toxicity and response criteria of  the [facility]". [place] Oncol. 5 (6): 649-55    LABORATORY DATA:  Lab Results  Component Value Date   WBC 7.9 05/29/2021   HGB 12.1 05/29/2021   HCT 36.2 05/29/2021   MCV 95.0 05/29/2021   PLT 364 05/29/2021   Lab Results  Component Value Date   NA 137 05/29/2021   K 3.2 (L) 05/29/2021   CL 100 05/29/2021   CO2 27 05/29/2021   Lab Results  Component Value Date   ALT 20 05/29/2021   AST 26 05/29/2021   ALKPHOS 49 05/29/2021   BILITOT 0.3 05/29/2021      RADIOGRAPHY: MR Brain W Wo Contrast  Result Date: 06/30/2021 CLINICAL DATA:  Brain/CNS neoplasm, surveillance 3T SRS Protocol. Metastatic lung cancer, post completion of prophylactic whole brain XRT EXAM: MRI HEAD WITHOUT AND WITH CONTRAST TECHNIQUE: Multiplanar, multiecho pulse sequences of the brain and surrounding structures were obtained without and with intravenous contrast. CONTRAST:  47mL MULTIHANCE GADOBENATE DIMEGLUMINE 529 MG/ML IV SOLN COMPARISON:  03/07/2021 FINDINGS: Brain: There is no acute infarction or intracranial hemorrhage. There is no intracranial mass, mass effect, or edema. There is no hydrocephalus or extra-axial fluid collection. Ventricles and sulci are stable in size and configuration. Small chronic infarcts right cerebellum. No abnormal enhancement. Vascular: Major vessel flow voids at the skull base are preserved. Skull and upper cervical spine: No substantial change in left parietal calvarial lesion. Sinuses/Orbits: Paranasal sinuses are aerated. Orbits are unremarkable. Other: Sella is unremarkable. Increased right greater than left mastoid fluid opacification. RI so basically distal mass IMPRESSION: No evidence of intracranial metastatic disease. Unchanged left parietal calvarial lesion, which is probably benign. Electronically Signed   By: Macy Mis M.D.   On: 06/30/2021 10:22        IMPRESSION/PLAN: 1. Limited Stage Small Cell Carcinoma of the RUL. The patient  appears to be doing very well and remains stable from her diagnosis both in the chest and in her brain. We will plan to repeat her MRI in 4 months time, or sooner if she develops concerns or symptoms prior to that visit. She will also follow up with Dr. Worthy Flank team in surveillance.  2. Prevention of cognitive deficits from whole brain radiation. She continues to tolerate and take Namenda. We discussed that  this course should conclude in January 2023. She is in agreement with this plan. 3. Peripheral neuropathy. This may be delayed onset from prior chemotherapy. I suggested she try Vitamin B6 supplementation for about 3 months to see if this helps. If her symptoms progress, she will contact her PCP.   Given current concerns for patient exposure during the COVID-19 pandemic, this encounter was conducted via telephone.  The patient has provided two factor identification and has given verbal consent for this type of encounter and has been advised to only accept a meeting of this type in a secure network environment. The time spent during this encounter was 30 minutes including preparation, discussion, and coordination of the patient's care. The attendants for this meeting included Hayden Pedro  and [name].  During the encounter,  Hayden Pedro was located at [facility] Radiation Oncology Department.  Susan Cortez was located at home.       Carola Rhine, [facility]   **Disclaimer: This note was dictated with voice recognition software. Similar sounding words can inadvertently be transcribed and this note may contain transcription errors which may not have been corrected upon publication of note.**

## 2021-07-11 ENCOUNTER — Ambulatory Visit: Payer: Medicare Other | Admitting: Radiation Oncology

## 2021-07-12 ENCOUNTER — Encounter: Payer: Self-pay | Admitting: Registered Nurse

## 2021-07-12 ENCOUNTER — Other Ambulatory Visit: Payer: Self-pay

## 2021-07-12 ENCOUNTER — Encounter: Payer: Medicare Other | Attending: Registered Nurse | Admitting: Registered Nurse

## 2021-07-12 VITALS — BP 110/73 | HR 110 | Temp 98.1°F | Ht 69.0 in | Wt 187.0 lb

## 2021-07-12 DIAGNOSIS — M25511 Pain in right shoulder: Secondary | ICD-10-CM | POA: Insufficient documentation

## 2021-07-12 DIAGNOSIS — M4802 Spinal stenosis, cervical region: Secondary | ICD-10-CM | POA: Insufficient documentation

## 2021-07-12 DIAGNOSIS — Z5181 Encounter for therapeutic drug level monitoring: Secondary | ICD-10-CM | POA: Insufficient documentation

## 2021-07-12 DIAGNOSIS — G8929 Other chronic pain: Secondary | ICD-10-CM | POA: Insufficient documentation

## 2021-07-12 DIAGNOSIS — M542 Cervicalgia: Secondary | ICD-10-CM | POA: Diagnosis not present

## 2021-07-12 DIAGNOSIS — M5412 Radiculopathy, cervical region: Secondary | ICD-10-CM | POA: Diagnosis not present

## 2021-07-12 DIAGNOSIS — Z79891 Long term (current) use of opiate analgesic: Secondary | ICD-10-CM | POA: Diagnosis not present

## 2021-07-12 DIAGNOSIS — G894 Chronic pain syndrome: Secondary | ICD-10-CM | POA: Insufficient documentation

## 2021-07-12 DIAGNOSIS — R000 Tachycardia, unspecified: Secondary | ICD-10-CM | POA: Insufficient documentation

## 2021-07-12 MED ORDER — ACETAMINOPHEN-CODEINE #4 300-60 MG PO TABS
1.0000 | ORAL_TABLET | Freq: Every day | ORAL | 2 refills | Status: DC
Start: 2021-07-12 — End: 2021-10-25

## 2021-07-12 NOTE — Progress Notes (Signed)
Subjective:    Patient ID: Susan Cortez, female    DOB: 08-01-1961, 60 y.o.   MRN: [id number]  HPI: Susan Cortez is a 60 y.o. female who returns for follow up appointment for chronic pain and medication refill. She states her pain is located in her neck radiating into her right shoulder. She rates her pain 5. Her current exercise regime is walking, she was encouraged to increased her HEP as tolerated.   [name] arrived tachycardia, apical pulse 110, her PCP placed a referral to Cardiology she states. Her cardiology appointment is next week she states.   [name] Morphine equivalent is 9.00 MME. She was prescribed Lorazepam for pre procedure, She was  prescribed Lorazepam by Shona Simpson .We have discussed the black box warning of using opioids and benzodiazepines. I highlighted the dangers of using these drugs together and discussed the adverse events including respiratory suppression, overdose, cognitive impairment and importance of compliance with current regimen. We will continue to monitor and adjust as indicated.   Last UDS was Performed on 04/17/2021, it was consistent.     Pain Inventory Average Pain 7 Pain Right Now 5 My pain is constant, sharp, burning, stabbing, and aching  In the last 24 hours, has pain interfered with the following? General activity 1 Relation with others 0 Enjoyment of life 1 What TIME of day is your pain at its worst? daytime, evening, and night Sleep (in general) Good  Pain is worse with: walking, standing, and some activites Pain improves with: rest, pacing activities, and medication Relief from Meds: 8  Family History  Problem Relation Age of Onset   Diabetes Father    Heart disease Father    Hyperlipidemia Father    Colon cancer Neg Hx    Colon polyps Neg Hx    Esophageal cancer Neg Hx    Rectal cancer Neg Hx    Stomach cancer Neg Hx    Social History   Socioeconomic History   Marital status: Divorced    Spouse name:  Not on file   Number of children: Not on file   Years of education: Not on file   Highest education level: Not on file  Occupational History   Not on file  Tobacco Use   Smoking status: Every Day    Packs/day: 1.00    Years: 45.00    Pack years: 45.00    Types: Cigarettes   Smokeless tobacco: Never   Tobacco comments:    1 smoked per day 03/28/21 ARJ 1 pack 06/01/21 BR  Vaping Use   Vaping Use: Former  Substance and Sexual Activity   Alcohol use: No   Drug use: No   Sexual activity: Not on file    Comment: Tubal Ligation  Other Topics Concern   Not on file  Social History Narrative   Not on file   Social Determinants of Health   Financial Resource Strain: Low Risk    Difficulty of Paying Living Expenses: Not hard at all  Food Insecurity: No Food Insecurity   Worried About Charity fundraiser in the Last Year: Never true   Ran Out of Food in the Last Year: Never true  Transportation Needs: No Transportation Needs   Lack of Transportation (Medical): No   Lack of Transportation (Non-Medical): No  Physical Activity: Inactive   Days of Exercise per Week: 0 days   Minutes of Exercise per Session: 0 min  Stress: Stress Concern Present   Feeling of  Stress : To some extent  Social Connections: Socially Isolated   Frequency of Communication with Friends and Family: More than three times a week   Frequency of Social Gatherings with Friends and Family: More than three times a week   Attends Religious Services: Never   Marine scientist or Organizations: No   Attends Archivist Meetings: Never   Marital Status: Divorced   Past Surgical History:  Procedure Laterality Date   CESAREAN SECTION     X 3   COLONOSCOPY  2013, 2016, 2019   hx colon polyps   DILATION AND CURETTAGE OF UTERUS     FUDUCIAL PLACEMENT Right 10/18/2020   Procedure: PLACEMENT OF FUDUCIAL;  Surgeon: Collene Gobble, MD;  Location: [facility];  Service: Thoracic;  Laterality: Right;   IR IMAGING  GUIDED PORT INSERTION  11/29/2020   SHOULDER ARTHROSCOPY WITH OPEN ROTATOR CUFF REPAIR Right 05/2009   TONSILLECTOMY  1977   TUBAL LIGATION     VIDEO BRONCHOSCOPY WITH ENDOBRONCHIAL NAVIGATION N/A 10/18/2020   Procedure: VIDEO BRONCHOSCOPY WITH ENDOBRONCHIAL NAVIGATION;  Surgeon: Collene Gobble, MD;  Location: [facility];  Service: Thoracic;  Laterality: N/A;   VIDEO BRONCHOSCOPY WITH ENDOBRONCHIAL ULTRASOUND N/A 10/18/2020   Procedure: VIDEO BRONCHOSCOPY WITH ENDOBRONCHIAL ULTRASOUND;  Surgeon: Collene Gobble, MD;  Location: [facility];  Service: Thoracic;  Laterality: N/A;   WISDOM TOOTH EXTRACTION     Past Surgical History:  Procedure Laterality Date   CESAREAN SECTION     X 3   COLONOSCOPY  2013, 2016, 2019   hx colon polyps   DILATION AND CURETTAGE OF UTERUS     FUDUCIAL PLACEMENT Right 10/18/2020   Procedure: PLACEMENT OF FUDUCIAL;  Surgeon: Collene Gobble, MD;  [facility];  Service: Thoracic;  Laterality: Right;   IR IMAGING GUIDED PORT INSERTION  11/29/2020   SHOULDER ARTHROSCOPY WITH OPEN ROTATOR CUFF REPAIR Right 05/2009   TONSILLECTOMY  1977   TUBAL LIGATION     VIDEO BRONCHOSCOPY WITH ENDOBRONCHIAL NAVIGATION N/A 10/18/2020   Procedure: VIDEO BRONCHOSCOPY WITH ENDOBRONCHIAL NAVIGATION;  Surgeon: Collene Gobble, MD;  Location: [facility];  Service: Thoracic;  Laterality: N/A;   VIDEO BRONCHOSCOPY WITH ENDOBRONCHIAL ULTRASOUND N/A 10/18/2020   Procedure: VIDEO BRONCHOSCOPY WITH ENDOBRONCHIAL ULTRASOUND;  Surgeon: Collene Gobble, MD;  [facility];  Service: Thoracic;  Laterality: N/A;   WISDOM TOOTH EXTRACTION     Past Medical History:  Diagnosis Date   Allergy    Arthritis    Basal cell carcinoma 2018   Removed from Right neck per patient    Diverticulosis 2013   HLD (hyperlipidemia)    Hx of adenomatous colonic polyps 2013   OAB (overactive bladder)    Pre-diabetes    Small cell lung cancer ([place]) 11/09/2020   Ht 5\' 9"  (1.753 m)   Wt 187 lb (84.8 kg)   LMP  (LMP Unknown)    BMI 27.62 kg/m   Opioid Risk Score:   Fall Risk Score:  `1  Depression screen PHQ 2/9  Depression screen [facility] 2/9 07/12/2021 06/14/2021 04/18/2021 08/23/2020 03/10/2020  Decreased Interest 0 0 0 0 0  Down, Depressed, Hopeless 0 0 0 0 0  PHQ - 2 Score 0 0 0 0 0  Some recent data might be hidden     Review of Systems  Constitutional: Negative.   HENT: Negative.    Eyes: Negative.   Respiratory: Negative.    Cardiovascular: Negative.   Gastrointestinal: Negative.  Endocrine: Negative.   Genitourinary: Negative.   Musculoskeletal:  Positive for back pain and neck pain.  Skin: Negative.   Allergic/Immunologic: Negative.   Neurological: Negative.   Hematological: Negative.   Psychiatric/Behavioral: Negative.        Objective:   Physical Exam Vitals and nursing note reviewed.  Constitutional:      Appearance: Normal appearance.  Neck:     Comments: Cervical Paraspinal Tenderness: C-5- C-6 Cardiovascular:     Rate and Rhythm: Regular rhythm. Tachycardia present.     Pulses: Normal pulses.     Heart sounds: Normal heart sounds.  Pulmonary:     Effort: Pulmonary effort is normal.     Breath sounds: Normal breath sounds.  Musculoskeletal:     Cervical back: Normal range of motion and neck supple.     Comments: Normal Muscle Bulk and Muscle Testing Reveals:  Upper Extremities: Full ROM and Muscle Strength 5/5 Right AC Joint Tenderness Thoracic Paraspinal Tenderness: T-1--T-3 Mainly Right Side  Lower Extremities: Full ROM and Muscle Strength 5/5 Arises from chair with ease Narrow Based  Gait     Skin:    General: Skin is warm and dry.  Neurological:     Mental Status: She is alert and oriented to person, place, and time.  Psychiatric:        Mood and Affect: Mood normal.        Behavior: Behavior normal.         Assessment & Plan:  1.Cervical spinal stenosis without evidence of radiculopathy. Chronic neck pain: Refilled Tylenol #4  300/60mg  daily, #30.  07/12/2021. We will continue the opioid monitoring program, this consists of regular clinic visits, examinations, urine drug screen, pill counts as well as use of [place] Controlled Substance Reporting system. A 12 month History has been reviewed on the [place] on 07/12/2021. 2. Right shoulder rotator cuff tear, status post repair with chronic pain and contracture : Continue with exercise regime as tolerated .07/12/2021. 3. Insomnia: Continue Melatonin. 07/12/2021 4. Chronic Bilateral Lower Back Pain without Sciatica: No complaints today. Continue HEP as tolerated. Continue to Monitor. 07/12/2021  5. Right Greater Trochanter Bursitis: No complaints Today. Continue to Alternate Ice and Heat Therapy. Continue to Monitor11/16/2022 6. Chronic right Sided Thoracic Back Pain: Continue current medication regime. Continue HEP as tolerated. Continue to monitor. 07/12/2021   F/U in 3 months

## 2021-07-13 ENCOUNTER — Other Ambulatory Visit: Payer: Self-pay | Admitting: Family Medicine

## 2021-07-13 DIAGNOSIS — E785 Hyperlipidemia, unspecified: Secondary | ICD-10-CM

## 2021-07-17 ENCOUNTER — Inpatient Hospital Stay: Payer: Medicare Other

## 2021-07-19 ENCOUNTER — Other Ambulatory Visit: Payer: Self-pay

## 2021-07-19 ENCOUNTER — Encounter: Payer: Self-pay | Admitting: Internal Medicine

## 2021-07-19 ENCOUNTER — Ambulatory Visit (INDEPENDENT_AMBULATORY_CARE_PROVIDER_SITE_OTHER): Payer: Medicare Other | Admitting: Internal Medicine

## 2021-07-19 VITALS — BP 110/74 | HR 114 | Ht 68.0 in | Wt 185.2 lb

## 2021-07-19 DIAGNOSIS — R000 Tachycardia, unspecified: Secondary | ICD-10-CM

## 2021-07-19 NOTE — Patient Instructions (Signed)
Medication Instructions:  Your physician recommends that you continue on your current medications as directed. Please refer to the Current Medication list given to you today.  *If you need a refill on your cardiac medications before your next appointment, please call your pharmacy*   Testing/Procedures: Your physician has requested that you have an echocardiogram. Echocardiography is a painless test that uses sound waves to create images of your heart. It provides your doctor with information about the size and shape of your heart and how well your heart's chambers and valves are working. This procedure takes approximately one hour. There are no restrictions for this procedure. This procedure is done at [address].   Follow-Up: At [facility], you and your health needs are our priority.  As part of our continuing mission to provide you with exceptional heart care, we have created designated Provider Care Teams.  These Care Teams include your primary Cardiologist (physician) and Advanced Practice Providers (APPs -  Physician Assistants and Nurse Practitioners) who all work together to provide you with the care you need, when you need it.  We recommend signing up for the patient portal called "MyChart".  Sign up information is provided on this After Visit Summary.  MyChart is used to connect with patients for Virtual Visits (Telemedicine).  Patients are able to view lab/test results, encounter notes, upcoming appointments, etc.  Non-urgent messages can be sent to your provider as well.   To learn more about what you can do with MyChart, go to [email or web address].    Your next appointment:   3 month(s)  The format for your next appointment:   In Person  Provider:   Phineas Inches, MD

## 2021-07-19 NOTE — Progress Notes (Signed)
Cardiology Office Note:    Date:  07/19/2021   ID:  Susan Cortez, DOB 06-18-1961, MRN [id number]  PCP:  Darreld Mclean, MD   [facility] Providers Cardiologist:  None     Referring MD: Darreld Mclean, MD   No chief complaint on file. Tachycardia  History of Present Illness:    Susan Cortez is a 60 y.o. female with a hx of limited stage small cell carcinoma diagnosed 09/2020 of the RUL s/p chemoradiation  completed on Spring of 2022, whole brain radiaiton completed 03/28/21 referral for tachycardia   She's prescribed albuterol but she does not use it. She's on metoprolol 12.5 mg XL. She feels weak and dizzy with it. She stays in bed. She is very fatigued. She is working on hydration. She can feel her heart rate racing at times. Her smart watch says sinus rhythm. She denies syncope. She gets some LH/dizzy with standing up.   She denies fevers,  dehydration, pain or anxiety. She has some loss of appetite. She has persistent neuropathy. She's had sinus tachycardia going back to February of 2018. She has not seen a cardiology, no stress test approximately 37 years ago which was normal. No echo. She's had CT scans , showing no significant atherosclerotic calcification.  05/29/2021- Sinus tachycardia HR 113 BPM, RAE  No left chest radiation, ECOG 0 Chemo-cisplatin, etoposide No metastasis  Family Hx: Mother- atrial fibrillation, atrial flutter, CHF, htn, hypothyroidism, CKD, HLD. Father - heart attack s/p PCI in his 28s. Brother had 2 Mis.  Social: smokes pack per day. No etoh, no drug use. She has three children.   Past Medical History:  Diagnosis Date   Allergy    Arthritis    Basal cell carcinoma 2018   Removed from Right neck per patient    Diverticulosis 2013   HLD (hyperlipidemia)    Hx of adenomatous colonic polyps 2013   OAB (overactive bladder)    Pre-diabetes    Small cell lung cancer ([place]) 11/09/2020    Past Surgical History:  Procedure Laterality Date    CESAREAN SECTION     X 3   COLONOSCOPY  2013, 2016, 2019   hx colon polyps   DILATION AND CURETTAGE OF UTERUS     FUDUCIAL PLACEMENT Right 10/18/2020   Procedure: PLACEMENT OF FUDUCIAL;  Surgeon: Collene Gobble, MD;  Location: [facility];  Service: Thoracic;  Laterality: Right;   IR IMAGING GUIDED PORT INSERTION  11/29/2020   SHOULDER ARTHROSCOPY WITH OPEN ROTATOR CUFF REPAIR Right 05/2009   TONSILLECTOMY  1977   TUBAL LIGATION     VIDEO BRONCHOSCOPY WITH ENDOBRONCHIAL NAVIGATION N/A 10/18/2020   Procedure: VIDEO BRONCHOSCOPY WITH ENDOBRONCHIAL NAVIGATION;  Surgeon: Collene Gobble, MD;  Location: [facility] OR;  Service: Thoracic;  Laterality: N/A;   VIDEO BRONCHOSCOPY WITH ENDOBRONCHIAL ULTRASOUND N/A 10/18/2020   Procedure: VIDEO BRONCHOSCOPY WITH ENDOBRONCHIAL ULTRASOUND;  Surgeon: Collene Gobble, MD;  Location: [facility] OR;  Service: Thoracic;  Laterality: N/A;   WISDOM TOOTH EXTRACTION      Current Medications: No outpatient medications have been marked as taking for the 07/19/21 encounter (Appointment) with Janina Mayo, MD.     Allergies:   Lyrica [pregabalin], Butrans [buprenorphine], Chantix [varenicline], Diclofenac, Meloxicam, Tolmetin, and Nsaids   Social History   Socioeconomic History   Marital status: Divorced    Spouse name: Not on file   Number of children: Not on file   Years of education: Not on file   Highest  education level: Not on file  Occupational History   Not on file  Tobacco Use   Smoking status: Every Day    Packs/day: 1.00    Years: 45.00    Pack years: 45.00    Types: Cigarettes   Smokeless tobacco: Never   Tobacco comments:    1 smoked per day 03/28/21 ARJ 1 pack 06/01/21 BR  Vaping Use   Vaping Use: Former  Substance and Sexual Activity   Alcohol use: No   Drug use: No   Sexual activity: Not on file    Comment: Tubal Ligation  Other Topics Concern   Not on file  Social History Narrative   Not on file   Social Determinants of Health   Financial  Resource Strain: Low Risk    Difficulty of Paying Living Expenses: Not hard at all  Food Insecurity: No Food Insecurity   Worried About Charity fundraiser in the Last Year: Never true   [place] in the Last Year: Never true  Transportation Needs: No Transportation Needs   Lack of Transportation (Medical): No   Lack of Transportation (Non-Medical): No  Physical Activity: Inactive   Days of Exercise per Week: 0 days   Minutes of Exercise per Session: 0 min  Stress: Stress Concern Present   Feeling of Stress : To some extent  Social Connections: Socially Isolated   Frequency of Communication with Friends and Family: More than three times a week   Frequency of Social Gatherings with Friends and Family: More than three times a week   Attends Religious Services: Never   Marine scientist or Organizations: No   Attends Music therapist: Never   Marital Status: Divorced     Family History: The patient's family history includes Diabetes in her father; Heart disease in her father; Hyperlipidemia in her father. There is no history of Colon cancer, Colon polyps, Esophageal cancer, Rectal cancer, or Stomach cancer.  ROS:   Please see the history of present illness.     All other systems reviewed and are negative.  EKGs/Labs/Other Studies Reviewed:    The following studies were reviewed today:   EKG:  EKG is  ordered today.  The ekg ordered today demonstrates   Sinus tachycardia HR 114 bpm  Recent Labs: 02/21/2021: Magnesium 1.9 05/29/2021: ALT 20; BUN 12; Creatinine, Ser 0.96; Hemoglobin 12.1; Platelets 364; Potassium 3.2; Sodium 137; TSH 2.615  Recent Lipid Panel    Component Value Date/Time   CHOL 168 06/14/2021 0847   TRIG 145.0 06/14/2021 0847   HDL 51.40 06/14/2021 0847   CHOLHDL 3 06/14/2021 0847   VLDL 29.0 06/14/2021 0847   LDLCALC 88 06/14/2021 0847   LDLCALC 94 06/08/2020 1050     Risk Assessment/Calculations:           Physical Exam:     VS:  LMP  (LMP Unknown)     Wt Readings from Last 3 Encounters:  07/12/21 187 lb (84.8 kg)  06/14/21 189 lb 12.8 oz (86.1 kg)  06/01/21 192 lb 12.8 oz (87.5 kg)     GEN:  Well nourished, well developed in no acute distress. Bald, post radiation HEENT: Normal NECK: No JVD; No carotid bruits LYMPHATICS: No lymphadenopathy CARDIAC: tachycardia, no murmurs, rubs, gallops RESPIRATORY:  Clear to auscultation without rales, wheezing or rhonchi  ABDOMEN: Soft, non-tender, non-distended MUSCULOSKELETAL:  No edema; No deformity  SKIN: Warm and dry NEUROLOGIC:  Alert and oriented x 3 PSYCHIATRIC:  Normal  affect   ASSESSMENT:    #Sinus tachycardia: She has persistent sinus tachycardia. Can be related to being intravascularly dry with orthostatic symptoms; however she's had high rates going back to 2018. No medications are contributing. She has no known structural heart disease. Malignancy can increase sympathetic tone, this may contribute (Majerova et al.  Increased sympathetic modulation in breast cancer survivors determined by measurement of heart rate variability. Nature/Scientific Reports. 2022). This may be due to inappropriate sinus tachycardia. She continues to smoke, I imagine has had counseling can re-address again on her follow up visit.  #Cardio-onc: Cisplatin is associated with vascular endothelial dysfunction and can increased CVD risk, study population are patients with testicular cancer. Lysbeth Galas et al. Comprehensive Characterization of the Vascular Effects of Cisplatin-Based Chemotherapy in Patients With Testicular Cancer. JACC Cardio-Onc 2020)   PLAN:    In order of problems listed above:  TTE Continue metoprolol 12.5 mg XL Follow up 3 months      Medication Adjustments/Labs and Tests Ordered: Current medicines are reviewed at length with the patient today.  Concerns regarding medicines are outlined above.   Signed, Janina Mayo, MD  07/19/2021 7:30 AM    [facility]

## 2021-07-23 ENCOUNTER — Encounter: Payer: Self-pay | Admitting: Emergency Medicine

## 2021-07-23 NOTE — Telephone Encounter (Signed)
RB please advise. Thanks.  

## 2021-07-24 ENCOUNTER — Ambulatory Visit: Payer: Medicare Other | Admitting: Radiation Oncology

## 2021-08-02 ENCOUNTER — Encounter: Payer: Self-pay | Admitting: Internal Medicine

## 2021-08-08 ENCOUNTER — Encounter: Payer: Self-pay | Admitting: Family Medicine

## 2021-08-11 ENCOUNTER — Ambulatory Visit (HOSPITAL_COMMUNITY): Payer: Medicare Other | Attending: Cardiovascular Disease

## 2021-08-11 ENCOUNTER — Other Ambulatory Visit: Payer: Self-pay

## 2021-08-11 DIAGNOSIS — I5030 Unspecified diastolic (congestive) heart failure: Secondary | ICD-10-CM

## 2021-08-11 DIAGNOSIS — Z8249 Family history of ischemic heart disease and other diseases of the circulatory system: Secondary | ICD-10-CM | POA: Insufficient documentation

## 2021-08-11 DIAGNOSIS — I3139 Other pericardial effusion (noninflammatory): Secondary | ICD-10-CM | POA: Diagnosis not present

## 2021-08-11 DIAGNOSIS — R000 Tachycardia, unspecified: Secondary | ICD-10-CM | POA: Insufficient documentation

## 2021-08-11 DIAGNOSIS — Z85118 Personal history of other malignant neoplasm of bronchus and lung: Secondary | ICD-10-CM | POA: Insufficient documentation

## 2021-08-11 DIAGNOSIS — F17200 Nicotine dependence, unspecified, uncomplicated: Secondary | ICD-10-CM | POA: Insufficient documentation

## 2021-08-11 DIAGNOSIS — R42 Dizziness and giddiness: Secondary | ICD-10-CM | POA: Insufficient documentation

## 2021-08-11 DIAGNOSIS — Z923 Personal history of irradiation: Secondary | ICD-10-CM | POA: Diagnosis not present

## 2021-08-11 DIAGNOSIS — Z85841 Personal history of malignant neoplasm of brain: Secondary | ICD-10-CM | POA: Insufficient documentation

## 2021-08-11 LAB — ECHOCARDIOGRAM COMPLETE
Area-P 1/2: 4.59 cm2
S' Lateral: 2.8 cm

## 2021-08-14 ENCOUNTER — Other Ambulatory Visit: Payer: Self-pay

## 2021-08-14 ENCOUNTER — Inpatient Hospital Stay: Payer: Medicare Other | Attending: Physician Assistant

## 2021-08-14 DIAGNOSIS — C3491 Malignant neoplasm of unspecified part of right bronchus or lung: Secondary | ICD-10-CM

## 2021-08-14 DIAGNOSIS — C3411 Malignant neoplasm of upper lobe, right bronchus or lung: Secondary | ICD-10-CM | POA: Insufficient documentation

## 2021-08-14 DIAGNOSIS — Z452 Encounter for adjustment and management of vascular access device: Secondary | ICD-10-CM | POA: Diagnosis not present

## 2021-08-14 DIAGNOSIS — Z95828 Presence of other vascular implants and grafts: Secondary | ICD-10-CM

## 2021-08-14 MED ORDER — SODIUM CHLORIDE 0.9% FLUSH
10.0000 mL | Freq: Once | INTRAVENOUS | Status: AC
  Administered 2021-08-14: 09:00:00 10 mL

## 2021-08-14 MED ORDER — HEPARIN SOD (PORK) LOCK FLUSH 100 UNIT/ML IV SOLN
500.0000 [IU] | Freq: Once | INTRAVENOUS | Status: AC
  Administered 2021-08-14: 09:00:00 500 [IU]

## 2021-08-27 ENCOUNTER — Encounter: Payer: Self-pay | Admitting: Internal Medicine

## 2021-08-27 ENCOUNTER — Other Ambulatory Visit: Payer: Self-pay | Admitting: Emergency Medicine

## 2021-08-29 ENCOUNTER — Other Ambulatory Visit: Payer: Self-pay

## 2021-08-29 ENCOUNTER — Inpatient Hospital Stay: Payer: Medicare Other | Attending: Physician Assistant

## 2021-08-29 ENCOUNTER — Ambulatory Visit (HOSPITAL_COMMUNITY)
Admission: RE | Admit: 2021-08-29 | Discharge: 2021-08-29 | Disposition: A | Discharge status: Home / Self Care | Payer: Medicare Other | Source: Ambulatory Visit | Attending: Physician Assistant | Admitting: Physician Assistant

## 2021-08-29 DIAGNOSIS — C3411 Malignant neoplasm of upper lobe, right bronchus or lung: Secondary | ICD-10-CM | POA: Insufficient documentation

## 2021-08-29 DIAGNOSIS — C3491 Malignant neoplasm of unspecified part of right bronchus or lung: Secondary | ICD-10-CM | POA: Insufficient documentation

## 2021-08-29 DIAGNOSIS — J439 Emphysema, unspecified: Secondary | ICD-10-CM | POA: Diagnosis not present

## 2021-08-29 DIAGNOSIS — Z9221 Personal history of antineoplastic chemotherapy: Secondary | ICD-10-CM | POA: Insufficient documentation

## 2021-08-29 DIAGNOSIS — D3502 Benign neoplasm of left adrenal gland: Secondary | ICD-10-CM | POA: Insufficient documentation

## 2021-08-29 DIAGNOSIS — I700 Atherosclerosis of aorta: Secondary | ICD-10-CM | POA: Insufficient documentation

## 2021-08-29 DIAGNOSIS — Z85828 Personal history of other malignant neoplasm of skin: Secondary | ICD-10-CM | POA: Insufficient documentation

## 2021-08-29 DIAGNOSIS — C3490 Malignant neoplasm of unspecified part of unspecified bronchus or lung: Secondary | ICD-10-CM | POA: Diagnosis not present

## 2021-08-29 DIAGNOSIS — I351 Nonrheumatic aortic (valve) insufficiency: Secondary | ICD-10-CM | POA: Insufficient documentation

## 2021-08-29 DIAGNOSIS — Z923 Personal history of irradiation: Secondary | ICD-10-CM | POA: Insufficient documentation

## 2021-08-29 DIAGNOSIS — R911 Solitary pulmonary nodule: Secondary | ICD-10-CM | POA: Diagnosis not present

## 2021-08-29 DIAGNOSIS — Z95828 Presence of other vascular implants and grafts: Secondary | ICD-10-CM

## 2021-08-29 DIAGNOSIS — Z86010 Personal history of colonic polyps: Secondary | ICD-10-CM | POA: Insufficient documentation

## 2021-08-29 DIAGNOSIS — N3281 Overactive bladder: Secondary | ICD-10-CM | POA: Insufficient documentation

## 2021-08-29 DIAGNOSIS — E785 Hyperlipidemia, unspecified: Secondary | ICD-10-CM | POA: Insufficient documentation

## 2021-08-29 DIAGNOSIS — J432 Centrilobular emphysema: Secondary | ICD-10-CM | POA: Insufficient documentation

## 2021-08-29 DIAGNOSIS — I3139 Other pericardial effusion (noninflammatory): Secondary | ICD-10-CM | POA: Insufficient documentation

## 2021-08-29 DIAGNOSIS — I2510 Atherosclerotic heart disease of native coronary artery without angina pectoris: Secondary | ICD-10-CM | POA: Insufficient documentation

## 2021-08-29 DIAGNOSIS — Z79899 Other long term (current) drug therapy: Secondary | ICD-10-CM | POA: Insufficient documentation

## 2021-08-29 LAB — CBC WITH DIFFERENTIAL (CANCER CENTER ONLY)
Abs Immature Granulocytes: 0.03 10*3/uL (ref 0.00–0.07)
Basophils Absolute: 0 10*3/uL (ref 0.0–0.1)
Basophils Relative: 0 %
Eosinophils Absolute: 0.1 10*3/uL (ref 0.0–0.5)
Eosinophils Relative: 1 %
HCT: 34.1 % — ABNORMAL LOW (ref 36.0–46.0)
Hemoglobin: 11.5 g/dL — ABNORMAL LOW (ref 12.0–15.0)
Immature Granulocytes: 0 %
Lymphocytes Relative: 15 %
Lymphs Abs: 1.4 10*3/uL (ref 0.7–4.0)
MCH: 31.9 pg (ref 26.0–34.0)
MCHC: 33.7 g/dL (ref 30.0–36.0)
MCV: 94.7 fL (ref 80.0–100.0)
Monocytes Absolute: 0.7 10*3/uL (ref 0.1–1.0)
Monocytes Relative: 8 %
Neutro Abs: 6.8 10*3/uL (ref 1.7–7.7)
Neutrophils Relative %: 76 %
Platelet Count: 334 10*3/uL (ref 150–400)
RBC: 3.6 MIL/uL — ABNORMAL LOW (ref 3.87–5.11)
RDW: 14.2 % (ref 11.5–15.5)
WBC Count: 9.1 10*3/uL (ref 4.0–10.5)
nRBC: 0 % (ref 0.0–0.2)

## 2021-08-29 LAB — CMP (CANCER CENTER ONLY)
ALT: 19 U/L (ref 0–44)
AST: 23 U/L (ref 15–41)
Albumin: 4 g/dL (ref 3.5–5.0)
Alkaline Phosphatase: 49 U/L (ref 38–126)
Anion gap: 7 (ref 5–15)
BUN: 12 mg/dL (ref 8–23)
CO2: 26 mmol/L (ref 22–32)
Calcium: 9.7 mg/dL (ref 8.9–10.3)
Chloride: 105 mmol/L (ref 98–111)
Creatinine: 0.83 mg/dL (ref 0.44–1.00)
GFR, Estimated: >60 mL/min (ref >60)
Glucose, Bld: 118 mg/dL — ABNORMAL HIGH (ref 70–99)
Potassium: 3.9 mmol/L (ref 3.5–5.1)
Sodium: 138 mmol/L (ref 135–145)
Total Bilirubin: 0.3 mg/dL (ref 0.3–1.2)
Total Protein: 6.8 g/dL (ref 6.5–8.1)

## 2021-08-29 MED ORDER — SODIUM CHLORIDE (PF) 0.9 % IJ SOLN
INTRAMUSCULAR | Status: AC
  Filled 2021-08-29: qty 50

## 2021-08-29 MED ORDER — HEPARIN SOD (PORK) LOCK FLUSH 100 UNIT/ML IV SOLN: 500.0000 [IU] | Freq: Once | INTRAVENOUS | Status: DC

## 2021-08-29 MED ORDER — IOHEXOL 350 MG/ML SOLN
80.0000 mL | Freq: Once | INTRAVENOUS | Status: AC | PRN
  Administered 2021-08-29: 80 mL via INTRAVENOUS

## 2021-08-29 MED ORDER — SODIUM CHLORIDE 0.9% FLUSH
10.0000 mL | Freq: Once | INTRAVENOUS | Status: AC
  Administered 2021-08-29: 10 mL

## 2021-08-29 MED ORDER — HEPARIN SOD (PORK) LOCK FLUSH 100 UNIT/ML IV SOLN
INTRAVENOUS | Status: AC
  Administered 2021-08-29: 500 [IU]
  Filled 2021-08-29: qty 5

## 2021-08-31 ENCOUNTER — Inpatient Hospital Stay (HOSPITAL_BASED_OUTPATIENT_CLINIC_OR_DEPARTMENT_OTHER): Payer: Medicare Other | Admitting: Internal Medicine

## 2021-08-31 ENCOUNTER — Other Ambulatory Visit: Payer: Self-pay

## 2021-08-31 VITALS — BP 104/65 | HR 105 | Temp 97.5°F | Resp 20 | Ht 68.0 in | Wt 177.7 lb

## 2021-08-31 DIAGNOSIS — Z86010 Personal history of colonic polyps: Secondary | ICD-10-CM | POA: Diagnosis not present

## 2021-08-31 DIAGNOSIS — J432 Centrilobular emphysema: Secondary | ICD-10-CM | POA: Diagnosis not present

## 2021-08-31 DIAGNOSIS — Z923 Personal history of irradiation: Secondary | ICD-10-CM | POA: Diagnosis not present

## 2021-08-31 DIAGNOSIS — I351 Nonrheumatic aortic (valve) insufficiency: Secondary | ICD-10-CM | POA: Diagnosis not present

## 2021-08-31 DIAGNOSIS — I700 Atherosclerosis of aorta: Secondary | ICD-10-CM | POA: Diagnosis not present

## 2021-08-31 DIAGNOSIS — E785 Hyperlipidemia, unspecified: Secondary | ICD-10-CM | POA: Diagnosis not present

## 2021-08-31 DIAGNOSIS — D3502 Benign neoplasm of left adrenal gland: Secondary | ICD-10-CM | POA: Diagnosis not present

## 2021-08-31 DIAGNOSIS — Z85828 Personal history of other malignant neoplasm of skin: Secondary | ICD-10-CM | POA: Diagnosis not present

## 2021-08-31 DIAGNOSIS — N3281 Overactive bladder: Secondary | ICD-10-CM | POA: Diagnosis not present

## 2021-08-31 DIAGNOSIS — Z79899 Other long term (current) drug therapy: Secondary | ICD-10-CM | POA: Diagnosis not present

## 2021-08-31 DIAGNOSIS — I2510 Atherosclerotic heart disease of native coronary artery without angina pectoris: Secondary | ICD-10-CM | POA: Diagnosis not present

## 2021-08-31 DIAGNOSIS — Z9221 Personal history of antineoplastic chemotherapy: Secondary | ICD-10-CM | POA: Diagnosis not present

## 2021-08-31 DIAGNOSIS — I3139 Other pericardial effusion (noninflammatory): Secondary | ICD-10-CM | POA: Diagnosis not present

## 2021-08-31 DIAGNOSIS — C3411 Malignant neoplasm of upper lobe, right bronchus or lung: Secondary | ICD-10-CM | POA: Diagnosis not present

## 2021-08-31 DIAGNOSIS — C3491 Malignant neoplasm of unspecified part of right bronchus or lung: Secondary | ICD-10-CM

## 2021-08-31 DIAGNOSIS — Z5111 Encounter for antineoplastic chemotherapy: Secondary | ICD-10-CM

## 2021-08-31 NOTE — Progress Notes (Signed)
[facility] [phone]   [phone]  OFFICE PROGRESS NOTE  Copland, Gay Filler, MD [address] [place] [address]   DIAGNOSIS: Limited stage (T1c, N2, M0) Small Cell Lung Cancer. She presented with a right upper lobe suprahilar nodule/adenopathy, right paratracheal lymph node, and a hypermetabolic right upper lobe nodule. She was diagnosed in February 2022.    PRIOR THERAPY:   1) Systemic chemotherapy with cisplatin 80 mg per metered squared on days 1, etoposide 100 mg per metered squared on days 1, 2, and 3 IV every 3 weeks with concurrent radiation.  Last dose 02/01/21. Status post 4 cycles.  2) prophylactic cranial irradiation under the care of Dr. Lisbeth Renshaw.  Last treatment on 03/28/2021   CURRENT THERAPY: Observation   INTERVAL HISTORY: Susan Cortez 61 y.o. female returns to the clinic today for follow-up visit.  The patient is feeling fine today with no concerning complaints.  She denied having any chest pain, shortness of breath, cough or hemoptysis.  She denied having any fever or chills.  She has no nausea, vomiting, diarrhea or constipation.  She has no headache or visual changes.  She is currently on observation and doing well.  She had repeat CT scan of the chest performed recently and she is here for evaluation and discussion of her scan results.  MEDICAL HISTORY: Past Medical History:  Diagnosis Date   Allergy    Arthritis    Basal cell carcinoma 2018   Removed from Right neck per patient    Diverticulosis 2013   HLD (hyperlipidemia)    Hx of adenomatous colonic polyps 2013   OAB (overactive bladder)    Pre-diabetes    Small cell lung cancer ([place]) 11/09/2020    ALLERGIES:  is allergic to lyrica [pregabalin], butrans [buprenorphine], chantix [varenicline], diclofenac, doxycycline, meloxicam, tolmetin, and nsaids.  MEDICATIONS:  Current Outpatient Medications  Medication Sig Dispense Refill    acetaminophen-codeine (TYLENOL #4) 300-60 MG tablet Take 1 tablet by mouth daily. 30 tablet 2   CALCIUM PO Take 650 mg by mouth daily.     Cholecalciferol (VITAMIN D3) 125 MCG (5000 UT) CAPS Take 5,000 Units by mouth daily.     Cyanocobalamin (B-12) 100 MCG TABS      lidocaine-prilocaine (EMLA) cream APPLY 1 APPLICATION TOPICALLY AS NEEDED. 30 g 0   lovastatin (MEVACOR) 20 MG tablet TAKE 1 TABLET BY MOUTH AT BEDTIME 90 tablet 1   memantine (NAMENDA) 10 MG tablet Take 1 tablet (10 mg total) by mouth 2 (two) times daily. 60 tablet 4   metoprolol succinate (TOPROL-XL) 25 MG 24 hr tablet Take 0.5 tablets (12.5 mg total) by mouth daily. 45 tablet 3   Multiple Vitamins-Minerals (MULTIVITAMIN ADULT, MINERALS,) TABS      LORazepam (ATIVAN) 1 MG tablet TAKE 0.5 TABLET BY MOUTH EVERY 8 HOURS. TAKE 1 TABLET 30 MINUTES PRIOR TO MRI OR RADIATION (Patient not taking: Reported on 08/31/2021) 14 tablet 0   prochlorperazine (COMPAZINE) 10 MG tablet Take 1 tablet (10 mg total) by mouth every 6 (six) hours as needed. (Patient not taking: Reported on 08/31/2021) 30 tablet 2   No current facility-administered medications for this visit.    SURGICAL HISTORY:  Past Surgical History:  Procedure Laterality Date   CESAREAN SECTION     X 3   COLONOSCOPY  2013, 2016, 2019   hx colon polyps   DILATION AND CURETTAGE OF UTERUS     FUDUCIAL  PLACEMENT Right 10/18/2020   Procedure: PLACEMENT OF FUDUCIAL;  Surgeon: Collene Gobble, MD;  Location: [facility];  Service: Thoracic;  Laterality: Right;   IR IMAGING GUIDED PORT INSERTION  11/29/2020   SHOULDER ARTHROSCOPY WITH OPEN ROTATOR CUFF REPAIR Right 05/2009   TONSILLECTOMY  1977   TUBAL LIGATION     VIDEO BRONCHOSCOPY WITH ENDOBRONCHIAL NAVIGATION N/A 10/18/2020   Procedure: VIDEO BRONCHOSCOPY WITH ENDOBRONCHIAL NAVIGATION;  Surgeon: Collene Gobble, MD;  Location[facility];  Service: Thoracic;  Laterality: N/A;   VIDEO BRONCHOSCOPY WITH ENDOBRONCHIAL ULTRASOUND N/A 10/18/2020    Procedure: VIDEO BRONCHOSCOPY WITH ENDOBRONCHIAL ULTRASOUND;  Surgeon: Collene Gobble, MD;  Location: [facility];  Service: Thoracic;  Laterality: N/A;   WISDOM TOOTH EXTRACTION      REVIEW OF SYSTEMS:  A comprehensive review of systems was negative.   PHYSICAL EXAMINATION: General appearance: alert, cooperative, and no distress Head: Normocephalic, without obvious abnormality, atraumatic Neck: no adenopathy, no JVD, supple, symmetrical, trachea midline, and thyroid not enlarged, symmetric, no tenderness/mass/nodules Lymph nodes: Cervical, supraclavicular, and axillary nodes normal. Resp: clear to auscultation bilaterally Back: symmetric, no curvature. ROM normal. No CVA tenderness. Cardio: regular rate and rhythm, S1, S2 normal, no murmur, click, rub or gallop GI: soft, non-tender; bowel sounds normal; no masses,  no organomegaly Extremities: extremities normal, atraumatic, no cyanosis or edema  ECOG PERFORMANCE STATUS: 1 - Symptomatic but completely ambulatory  Blood pressure 104/65, pulse (!) 105, temperature (!) 97.5 F (36.4 C), temperature source Tympanic, resp. rate 20, height 5\' 8"  (1.727 m), weight 177 lb 11.2 oz (80.6 kg), SpO2 98 %.  LABORATORY DATA: Lab Results  Component Value Date   WBC 9.1 08/29/2021   HGB 11.5 (L) 08/29/2021   HCT 34.1 (L) 08/29/2021   MCV 94.7 08/29/2021   PLT 334 08/29/2021      Chemistry      Component Value Date/Time   NA 138 08/29/2021 1433   K 3.9 08/29/2021 1433   CL 105 08/29/2021 1433   CO2 26 08/29/2021 1433   BUN 12 08/29/2021 1433   CREATININE 0.83 08/29/2021 1433   CREATININE 0.65 06/08/2020 1050      Component Value Date/Time   CALCIUM 9.7 08/29/2021 1433   ALKPHOS 49 08/29/2021 1433   AST 23 08/29/2021 1433   ALT 19 08/29/2021 1433   BILITOT 0.3 08/29/2021 1433       RADIOGRAPHIC STUDIES: CT Chest W Contrast  Result Date: 08/30/2021 CLINICAL DATA:  Primary Cancer Type: Lung Imaging Indication: Routine surveillance  Interval therapy since last imaging? No Initial Cancer Diagnosis Date: 10/18/2020; Established by: Biopsy-proven Detailed Pathology: Limited stage Small Cell Lung Cancer. Primary Tumor location: Right upper lobe. Surgeries: No thoracic. Chemotherapy: Yes; Ongoing? No; Most recent administration: 02/01/2021 Immunotherapy? No Radiation therapy? Yes Date Range: 03/15/2021 - 03/28/2021; Target: Brain Date Range: 11/21/2020 - 01/04/2021; Target: Right lung EXAM: CT CHEST WITH CONTRAST TECHNIQUE: Multidetector CT imaging of the chest was performed during intravenous contrast administration. CONTRAST:  29mL OMNIPAQUE IOHEXOL 350 MG/ML SOLN COMPARISON:  Most recent CT chest 05/29/2021.  11/08/2020 PET-CT. FINDINGS: Cardiovascular: Right IJ Port-A-Cath terminates in the right atrium. Atherosclerotic calcification of the aorta and coronary arteries. Heart size normal. No pericardial effusion. Mediastinum/Nodes: Subcentimeter low-attenuation lesion in the right thyroid. No follow-up recommended. (Ref: J Am Coll Radiol. 2015 Feb;12(2): 143-50).No pathologically enlarged mediastinal, hilar or axillary lymph nodes. Esophagus is grossly unremarkable. Lungs/Pleura: Centrilobular emphysema. Scattered fiducial markers in the posterior segment right upper lobe. Peribronchovascular ground-glass and  nodularity in the posterior segment right upper lobe and superior segment right lower lobe, as before. Individual nodules measure 3 mm or less in size, similar. No pleural fluid. Airway is unremarkable. Upper Abdomen: Liver may be slightly decreased in attenuation diffusely. Visualized portions of the liver, gallbladder and right adrenal gland are otherwise unremarkable. Left adrenal nodules measure up to 1.9 cm and were characterized as adenomas on 11/08/2020. Visualized portions of the spleen, pancreas, stomach and bowel are unremarkable. Musculoskeletal: Degenerative changes in the spine. No worrisome lytic or sclerotic lesions.  IMPRESSION: 1. Similar peribronchovascular ground-glass and nodularity in the right upper and right lower lobes, possibly treatment related. Continued attention on follow-up exams is warranted. 2. Liver appears steatotic. 3. Left adrenal adenomas. 4. Aortic atherosclerosis (ICD10-I70.0). Coronary artery calcification. 5.  Emphysema (ICD10-J43.9). Electronically Signed   By: Lorin Picket M.D.   On: 08/30/2021 11:29   ECHOCARDIOGRAM COMPLETE  Result Date: 08/11/2021    ECHOCARDIOGRAM REPORT   Patient Name:   LETA BUCKLIN Date of Exam: 08/11/2021 Medical Rec #:  [id number]       Height:       68.0 in Accession #:    [id number]      Weight:       185.2 lb Date of Birth:  1960-12-04       BSA:          1.978 m Patient Age:    6 years        BP:           110/74 mmHg Patient Gender: F               HR:           105 bpm. Exam Location:  [facility] Procedure: 3D Echo, 2D Echo, Cardiac Doppler and Color Doppler Indications:    R00.0 Tachycardia  History:        Patient has no prior history of Echocardiogram examinations.                 Signs/Symptoms:Dizziness/Lightheadedness and Fatigue; Risk                 Factors:Family History of Coronary Artery Disease, Dyslipidemia                 and Current Smoker. Small Cell Lung Cancer status post                 Chemoradiation, with Mets to Brain status post Radiation.  Sonographer:    Deliah Boston RDCS Referring Phys: Janina Mayo IMPRESSIONS  1. Left ventricular ejection fraction, by estimation, is 60 to 65%. The left ventricle has normal function. The left ventricle has no regional wall motion abnormalities. Left ventricular diastolic parameters are consistent with Grade I diastolic dysfunction (impaired relaxation).  2. Right ventricular systolic function is normal. The right ventricular size is normal.  3. The pericardial effusion is circumferential.  4. The mitral valve is normal in structure. No evidence of mitral valve regurgitation. No evidence of  mitral stenosis.  5. The aortic valve is tricuspid. Aortic valve regurgitation is not visualized. No aortic stenosis is present.  6. The inferior vena cava is normal in size with greater than 50% respiratory variability, suggesting right atrial pressure of 3 mmHg. FINDINGS  Left Ventricle: Left ventricular ejection fraction, by estimation, is 60 to 65%. The left ventricle has normal function. The left ventricle has no regional wall motion abnormalities. The left ventricular internal cavity size  was normal in size. There is  no left ventricular hypertrophy. Left ventricular diastolic parameters are consistent with Grade I diastolic dysfunction (impaired relaxation). Normal left ventricular filling pressure. Right Ventricle: The right ventricular size is normal. No increase in right ventricular wall thickness. Right ventricular systolic function is normal. Left Atrium: Left atrial size was normal in size. Right Atrium: Right atrial size was normal in size. Pericardium: Trivial pericardial effusion is present. The pericardial effusion is circumferential. Mitral Valve: The mitral valve is normal in structure. No evidence of mitral valve regurgitation. No evidence of mitral valve stenosis. Tricuspid Valve: The tricuspid valve is normal in structure. Tricuspid valve regurgitation is not demonstrated. No evidence of tricuspid stenosis. Aortic Valve: The aortic valve is tricuspid. Aortic valve regurgitation is not visualized. No aortic stenosis is present. Pulmonic Valve: The pulmonic valve was normal in structure. Pulmonic valve regurgitation is not visualized. No evidence of pulmonic stenosis. Aorta: The aortic root is normal in size and structure. Venous: The inferior vena cava is normal in size with greater than 50% respiratory variability, suggesting right atrial pressure of 3 mmHg. IAS/Shunts: No atrial level shunt detected by color flow Doppler.  LEFT VENTRICLE PLAX 2D LVIDd:         4.60 cm   Diastology LVIDs:          2.80 cm   LV e' medial:    6.68 cm/s LV PW:         0.85 cm   LV E/e' medial:  8.4 LV IVS:        0.75 cm   LV e' lateral:   7.30 cm/s LVOT diam:     1.95 cm   LV E/e' lateral: 7.7 LVOT Area:     2.99 cm                           3D Volume EF:                          3D EF:        66 %                          LV EDV:       116 ml                          LV ESV:       39 ml                          LV SV:        77 ml LEFT ATRIUM           Index        RIGHT ATRIUM          Index LA diam:      3.40 cm 1.72 cm/m   RA Area:     8.19 cm LA Vol (A2C): 41.8 ml 21.13 ml/m  RA Volume:   16.10 ml 8.14 ml/m LA Vol (A4C): 29.4 ml 14.86 ml/m   AORTA Ao Root diam: 2.70 cm Ao Asc diam:  3.10 cm MITRAL VALVE MV Area (PHT): cm         SHUNTS MV Decel Time: 165 msec    Systemic Diam: 1.95 cm MV E velocity: 56.07 cm/s MV A velocity: 87.30 cm/s  MV E/A ratio:  0.64 Skeet Latch MD Electronically signed by Skeet Latch MD Signature Date/Time: 08/11/2021/3:47:45 PM    Final     ASSESSMENT AND PLAN: This is a very pleasant 61 years old white female with limited stage (T1c, N2, M0) small cell lung cancer presented with right upper lobe and suprahilar mass in addition to mediastinal lymphadenopathy diagnosed in February 2022.  The patient underwent systemic chemotherapy with cisplatin 80 mg/M2 on day 1 and etoposide 100 mg/M2 on days 1, 2 and 3 every 3 weeks status post 4 cycles. This was concurrent with radiotherapy followed by prophylactic cranial irradiation. She is currently on observation. The patient is feeling fine with no concerning complaints. She had repeat CT scan of the chest performed recently.  I discussed the scan results with the patient today and it showed no concerning findings for disease recurrence or metastasis. I recommended for her to continue on observation with repeat CT scan of the chest in 4 months. The patient was advised to call immediately if she has any other concerning symptoms in  the interval. The patient voices understanding of current disease status and treatment options and is in agreement with the current care plan.  All questions were answered. The patient knows to call the clinic with any problems, questions or concerns. We can certainly see the patient much sooner if necessary.  Disclaimer: This note was dictated with voice recognition software. Similar sounding words can inadvertently be transcribed and may not be corrected upon review.

## 2021-09-01 ENCOUNTER — Encounter: Payer: Self-pay | Admitting: Emergency Medicine

## 2021-09-01 ENCOUNTER — Ambulatory Visit (INDEPENDENT_AMBULATORY_CARE_PROVIDER_SITE_OTHER): Payer: Medicare Other | Admitting: Emergency Medicine

## 2021-09-01 DIAGNOSIS — R053 Chronic cough: Secondary | ICD-10-CM | POA: Diagnosis not present

## 2021-09-01 DIAGNOSIS — J449 Chronic obstructive pulmonary disease, unspecified: Secondary | ICD-10-CM

## 2021-09-01 DIAGNOSIS — C3491 Malignant neoplasm of unspecified part of right bronchus or lung: Secondary | ICD-10-CM

## 2021-09-01 NOTE — Patient Instructions (Signed)
We will hold off on ordering any inhaled medication at this time. Continue to follow with Dr. Julien Nordmann and get your repeat CT scan of the chest per his plans. Follow Dr. Lamonte Sakai in 1 year Please call our office if you have any progression of shortness of breath, increased cough, change in mucus production.  If so then we would like to see you sooner.

## 2021-09-01 NOTE — Progress Notes (Signed)
Subjective:    Patient ID: Susan Cortez, female    DOB: February 27, 1961, 61 y.o.   MRN: [id number]  HPI  ROV 06/01/21 -- Susan Cortez is 67 and has a history of continued tobacco use, COPD, small cell lung cancer that was diagnosed in 2022.  She underwent chemoradiation, completed in August.  She is dealing with chronic cough in the setting of chronic rhinitis.  I saw her in August and started loratadine and fluticasone nasal spray.  She denies any GERD symptoms at the time. No change in her nasal congestion, cough. The cough is worst in the am, clears clear/white mucous. She denies any dyspnea unless she is carrying groceries, etc.   CT scan of the chest 05/24/2021 reviewed by me showed some peribronchovascular groundglass nodularity and septal thickening in the right upper and lower lobes, question etiology consider infectious bronchiolitis versus radiation change versus lymphangitic spread.  CT-PA chest was done 05/29/2021 when she was noted to have tachycardia, reviewed by me showed no evidence of PE, increased areas of peribronchovascular nodularity in the right upper lobe, question inflammatory or infectious process.  Lymphangitic spread not excluded.   ROV 09/01/21 --follow-up visit for patient with a history of tobacco use and associated mild COPD.  Also small cell lung cancer for which she underwent chemoradiation, currently on observation.  She has chronic cough in the setting of this as well as chronic rhinitis.  Unclear whether she has any associated GERD.  I tried starting her on empiric pantoprazole, Atrovent nasal spray to see if she would get benefit.  Cough really did not change so she was able to stop these. She does still feel that she has some throat congestion. She coughs up white / clear mucous every morning, also coughs in the evening when she lays down  She is still smoking 1 pk/day.  She does not get the flu shot, had the 2 original covid shots - no boosters.   CT chest 08/29/2021 reviewed  by me, shows peribronchovascular groundglass nodularity in the right upper and lower lobes posttreatment, unchanged.   Review of Systems As per HPI     Objective:   Physical Exam Vitals:   09/01/21 1208  BP: 122/74  Pulse: 97  Temp: 98.3 F (36.8 C)  TempSrc: Oral  SpO2: 97%  Weight: 178 lb 3.2 oz (80.8 kg)  Height: 5\' 8"  (1.727 m)   Gen: Pleasant, well-nourished, in no distress,  normal affect  ENT: No lesions,  mouth clear,  oropharynx clear, no postnasal drip  Neck: No JVD, no stridor  Lungs: No use of accessory muscles, no crackles or wheezing on normal respiration, no wheeze on forced expiration  Cardiovascular: RRR, heart sounds normal, no murmur or gallops, no peripheral edema  Musculoskeletal: No deformities, no cyanosis or clubbing  Neuro: alert, awake, non focal  Skin: Warm, no lesions or rash      Assessment & Plan:  COPD, mild (HCC) Not currently on bronchodilator therapy.  Her most persistent symptom is chronic cough and mucus production, usually in the morning.  Certainly being impacted by the fact that she continues to smoke.  Discussed this with her, recommended cessation today.  I will hold off on bronchodilator therapy for now.  If she has progressive dyspnea or other symptoms then we should retry.  Small cell carcinoma of right lung [facility]) Following with Dr. Julien Nordmann, serial imaging as per his plans.  Currently on observation  Cough No significant impact with empiric treatment GERD or  chronic rhinitis.  I do suspect that the persistent chronic rhinitis is a factor here.  Probably the biggest impact is being made by her continued smoking.  We talked about this today.  I will hold off on any GERD or rhinitis therapy for now since she has failed these in the past.  She needs to work on smoking cessation.  If she has progression of her cough, change in characteristics of her sputum then we would need to consider treatment of her underlying mild  COPD.   Baltazar Apo, MD, PhD 09/01/2021, 12:32 PM [facility] [phone] or if no answer [phone]

## 2021-09-01 NOTE — Assessment & Plan Note (Signed)
No significant impact with empiric treatment GERD or chronic rhinitis.  I do suspect that the persistent chronic rhinitis is a factor here.  Probably the biggest impact is being made by her continued smoking.  We talked about this today.  I will hold off on any GERD or rhinitis therapy for now since she has failed these in the past.  She needs to work on smoking cessation.  If she has progression of her cough, change in characteristics of her sputum then we would need to consider treatment of her underlying mild COPD.

## 2021-09-01 NOTE — Assessment & Plan Note (Addendum)
Following with Dr. Julien Nordmann, serial imaging as per his plans.  Currently on observation

## 2021-09-01 NOTE — Assessment & Plan Note (Signed)
Not currently on bronchodilator therapy.  Her most persistent symptom is chronic cough and mucus production, usually in the morning.  Certainly being impacted by the fact that she continues to smoke.  Discussed this with her, recommended cessation today.  I will hold off on bronchodilator therapy for now.  If she has progressive dyspnea or other symptoms then we should retry.

## 2021-09-15 ENCOUNTER — Other Ambulatory Visit: Payer: Self-pay

## 2021-09-15 DIAGNOSIS — C7931 Secondary malignant neoplasm of brain: Secondary | ICD-10-CM

## 2021-09-25 ENCOUNTER — Encounter: Payer: Self-pay | Admitting: Internal Medicine

## 2021-09-27 ENCOUNTER — Encounter: Payer: Medicare Other | Admitting: Registered Nurse

## 2021-10-02 ENCOUNTER — Inpatient Hospital Stay: Payer: Medicare Other | Attending: Physician Assistant

## 2021-10-02 ENCOUNTER — Other Ambulatory Visit: Payer: Self-pay

## 2021-10-02 DIAGNOSIS — Z95828 Presence of other vascular implants and grafts: Secondary | ICD-10-CM

## 2021-10-02 DIAGNOSIS — C3411 Malignant neoplasm of upper lobe, right bronchus or lung: Secondary | ICD-10-CM | POA: Diagnosis not present

## 2021-10-02 DIAGNOSIS — Z452 Encounter for adjustment and management of vascular access device: Secondary | ICD-10-CM | POA: Diagnosis not present

## 2021-10-02 DIAGNOSIS — C3491 Malignant neoplasm of unspecified part of right bronchus or lung: Secondary | ICD-10-CM

## 2021-10-02 MED ORDER — HEPARIN SOD (PORK) LOCK FLUSH 100 UNIT/ML IV SOLN
500.0000 [IU] | Freq: Once | INTRAVENOUS | Status: AC
  Administered 2021-10-02: 500 [IU]

## 2021-10-02 MED ORDER — SODIUM CHLORIDE 0.9% FLUSH
10.0000 mL | Freq: Once | INTRAVENOUS | Status: AC
  Administered 2021-10-02: 10 mL

## 2021-10-03 ENCOUNTER — Encounter: Payer: Self-pay | Admitting: Internal Medicine

## 2021-10-24 ENCOUNTER — Encounter: Payer: Self-pay | Admitting: Radiation Oncology

## 2021-10-24 NOTE — Progress Notes (Signed)
Spoke w/ patient, verified identity, and began nursing interview. Patient reports doing well. Denies any chest pain, shortness of breath, dizziness, or headaches. No issues reported at this time. ? ?Meaningful use complete. ?Postmenopausal- No chances of pregnancy. ? ?Reminded patient of her 3:30pm-10/30/21 telephone appointment w/ Shona Simpson PA-C. I left my extension [phone] in case patient needs anything. ? ?Patient contact [phone] ?

## 2021-10-25 ENCOUNTER — Encounter: Payer: Self-pay | Admitting: Registered Nurse

## 2021-10-25 ENCOUNTER — Encounter: Payer: Medicare Other | Attending: Registered Nurse | Admitting: Registered Nurse

## 2021-10-25 ENCOUNTER — Other Ambulatory Visit: Payer: Self-pay

## 2021-10-25 VITALS — BP 112/73 | HR 86 | Ht 68.0 in | Wt 175.0 lb

## 2021-10-25 DIAGNOSIS — M542 Cervicalgia: Secondary | ICD-10-CM

## 2021-10-25 DIAGNOSIS — Z5181 Encounter for therapeutic drug level monitoring: Secondary | ICD-10-CM

## 2021-10-25 DIAGNOSIS — G894 Chronic pain syndrome: Secondary | ICD-10-CM | POA: Diagnosis not present

## 2021-10-25 DIAGNOSIS — M5412 Radiculopathy, cervical region: Secondary | ICD-10-CM | POA: Diagnosis not present

## 2021-10-25 DIAGNOSIS — M25511 Pain in right shoulder: Secondary | ICD-10-CM | POA: Diagnosis not present

## 2021-10-25 DIAGNOSIS — M4802 Spinal stenosis, cervical region: Secondary | ICD-10-CM | POA: Diagnosis not present

## 2021-10-25 DIAGNOSIS — G8929 Other chronic pain: Secondary | ICD-10-CM

## 2021-10-25 DIAGNOSIS — Z79891 Long term (current) use of opiate analgesic: Secondary | ICD-10-CM

## 2021-10-25 MED ORDER — ACETAMINOPHEN-CODEINE #4 300-60 MG PO TABS: 1.0000 | ORAL_TABLET | Freq: Every day | ORAL | 2 refills | Status: DC

## 2021-10-25 NOTE — Progress Notes (Signed)
? ?Subjective:  ? ? Patient ID: Susan Cortez, female    DOB: 04-23-1961, 61 y.o.   MRN: [id number] ? ?HPI: Susan Cortez is a 61 y.o. female who returns for follow up appointment for chronic pain and medication refill. She states her pain is located in her neck radiating into her right shoulder. She rates her pain 5. Her current exercise regime is walking and performing stretching exercises. ? ?Susan Cortez Morphine equivalent is 9.00 MME.   UDS today.  ?  ?Pain Inventory ?Average Pain 7 ?Pain Right Now 5 ?My pain is constant, sharp, burning, dull, and stabbing ? ?In the last 24 hours, has pain interfered with the following? ?General activity 1 ?Relation with others 0 ?Enjoyment of life 1 ?What TIME of day is your pain at its worst? morning , daytime, and evening ?Sleep (in general) Fair ? ?Pain is worse with: walking, bending, standing, and some activites ?Pain improves with: medication ?Relief from Meds: 5 ? ?Family History  ?Problem Relation Age of Onset  ? Diabetes Father   ? Heart disease Father   ? Hyperlipidemia Father   ? Colon cancer Neg Hx   ? Colon polyps Neg Hx   ? Esophageal cancer Neg Hx   ? Rectal cancer Neg Hx   ? Stomach cancer Neg Hx   ? ?Social History  ? ?Socioeconomic History  ? Marital status: Divorced  ?  Spouse name: Not on file  ? Number of children: Not on file  ? Years of education: Not on file  ? Highest education level: Not on file  ?Occupational History  ? Not on file  ?Tobacco Use  ? Smoking status: Every Day  ?  Packs/day: 1.00  ?  Years: 45.00  ?  Pack years: 45.00  ?  Types: Cigarettes  ? Smokeless tobacco: Never  ? Tobacco comments:  ?  1 pack smoked per day ARJ 09/01/21  ?Vaping Use  ? Vaping Use: Former  ?Substance and Sexual Activity  ? Alcohol use: No  ? Drug use: No  ? Sexual activity: Not on file  ?  Comment: Tubal Ligation  ?Other Topics Concern  ? Not on file  ?Social History Narrative  ? Not on file  ? ?Social Determinants of Health  ? ?Financial Resource Strain: Low  Risk   ? Difficulty of Paying Living Expenses: Not hard at all  ?Food Insecurity: No Food Insecurity  ? Worried About Charity fundraiser in the Last Year: Never true  ? Ran Out of Food in the Last Year: Never true  ?Transportation Needs: No Transportation Needs  ? Lack of Transportation (Medical): No  ? Lack of Transportation (Non-Medical): No  ?Physical Activity: Inactive  ? Days of Exercise per Week: 0 days  ? Minutes of Exercise per Session: 0 min  ?Stress: Stress Concern Present  ? Feeling of Stress : To some extent  ?Social Connections: Socially Isolated  ? Frequency of Communication with Friends and Family: More than three times a week  ? Frequency of Social Gatherings with Friends and Family: More than three times a week  ? Attends Religious Services: Never  ? Active Member of Clubs or Organizations: No  ? Attends Archivist Meetings: Never  ? Marital Status: Divorced  ? ?Past Surgical History:  ?Procedure Laterality Date  ? CESAREAN SECTION    ? X 3  ? COLONOSCOPY  2013, 2016, 2019  ? hx colon polyps  ? DILATION AND CURETTAGE OF UTERUS    ?  FUDUCIAL PLACEMENT Right 10/18/2020  ? Procedure: PLACEMENT OF FUDUCIAL;  Surgeon: Collene Gobble, MD;  Location: [place];  Service: Thoracic;  Laterality: Right;  ? IR IMAGING GUIDED PORT INSERTION  11/29/2020  ? SHOULDER ARTHROSCOPY WITH OPEN ROTATOR CUFF REPAIR Right 05/2009  ? TONSILLECTOMY  1977  ? TUBAL LIGATION    ? VIDEO BRONCHOSCOPY WITH ENDOBRONCHIAL NAVIGATION N/A 10/18/2020  ? Procedure: VIDEO BRONCHOSCOPY WITH ENDOBRONCHIAL NAVIGATION;  Surgeon: Collene Gobble, MD;  [facility];  Service: Thoracic;  Laterality: N/A;  ? VIDEO BRONCHOSCOPY WITH ENDOBRONCHIAL ULTRASOUND N/A 10/18/2020  ? Procedure: VIDEO BRONCHOSCOPY WITH ENDOBRONCHIAL ULTRASOUND;  Surgeon: Collene Gobble, MD;  [facility];  Service: Thoracic;  Laterality: N/A;  ? WISDOM TOOTH EXTRACTION    ? ?Past Surgical History:  ?Procedure Laterality Date  ? CESAREAN SECTION    ? X 3  ?  COLONOSCOPY  2013, 2016, 2019  ? hx colon polyps  ? DILATION AND CURETTAGE OF UTERUS    ? FUDUCIAL PLACEMENT Right 10/18/2020  ? Procedure: PLACEMENT OF FUDUCIAL;  Surgeon: Collene Gobble, MD;  Location: [place];  Service: Thoracic;  Laterality: Right;  ? IR IMAGING GUIDED PORT INSERTION  11/29/2020  ? SHOULDER ARTHROSCOPY WITH OPEN ROTATOR CUFF REPAIR Right 05/2009  ? TONSILLECTOMY  1977  ? TUBAL LIGATION    ? VIDEO BRONCHOSCOPY WITH ENDOBRONCHIAL NAVIGATION N/A 10/18/2020  ? Procedure: VIDEO BRONCHOSCOPY WITH ENDOBRONCHIAL NAVIGATION;  Surgeon: Collene Gobble, MD;  [facility];  Service: Thoracic;  Laterality: N/A;  ? VIDEO BRONCHOSCOPY WITH ENDOBRONCHIAL ULTRASOUND N/A 10/18/2020  ? Procedure: VIDEO BRONCHOSCOPY WITH ENDOBRONCHIAL ULTRASOUND;  Surgeon: Collene Gobble, MD;  [facility];  Service: Thoracic;  Laterality: N/A;  ? WISDOM TOOTH EXTRACTION    ? ?Past Medical History:  ?Diagnosis Date  ? Allergy   ? Arthritis   ? Basal cell carcinoma 2018  ? Removed from Right neck per patient   ? Diverticulosis 2013  ? HLD (hyperlipidemia)   ? Hx of adenomatous colonic polyps 2013  ? OAB (overactive bladder)   ? Pre-diabetes   ? Small cell lung cancer ([place]) 11/09/2020  ? ?BP 112/73   Pulse 86   Ht 5\' 8"  (1.727 m)   Wt 175 lb (79.4 kg)   LMP  (LMP Unknown)   SpO2 98%   BMI 26.61 kg/m?  ? ?Opioid Risk Score:   ?Fall Risk Score:  `1 ? ?Depression screen PHQ 2/9 ? ?Depression screen [facility] 2/9 10/25/2021 07/12/2021 06/14/2021 04/18/2021 08/23/2020 03/10/2020  ?Decreased Interest 0 0 0 0 0 0  ?Down, Depressed, Hopeless 0 0 0 0 0 0  ?PHQ - 2 Score 0 0 0 0 0 0  ?Some recent data might be hidden  ?  ? ? ?Review of Systems  ?Musculoskeletal:  Positive for neck pain.  ?     Bilateral knee pain  ?All other systems reviewed and are negative. ? ?   ?Objective:  ? Physical Exam ?Vitals and nursing note reviewed.  ?Constitutional:   ?   Appearance: Normal appearance.  ?Cardiovascular:  ?   Rate and Rhythm: Normal rate and regular  rhythm.  ?   Pulses: Normal pulses.  ?   Heart sounds: Normal heart sounds.  ?Pulmonary:  ?   Effort: Pulmonary effort is normal.  ?   Breath sounds: Normal breath sounds.  ?Musculoskeletal:  ?   Cervical back: Normal range of motion and neck supple.  ?   Comments: Normal Muscle  Bulk and Muscle Testing Reveals:  ?Upper Extremities: Right: Decreased ROM  90 Degrees and Muscle Strength 5/5 ?Right: AC Joint Tenderness ?Left Upper Extremity: Full ROM and Muscle Strength 5/5 ?Thoracic Paraspinal Tenderness : T-3-T-6 ?Lower Extremities: Full ROM and Muscle Strength 5/5 ?Arises from Chair with Ease ?Narrow Based  Gait  ?   ?Skin: ?   General: Skin is warm and dry.  ?Neurological:  ?   Mental Status: She is alert and oriented to person, place, and time.  ?Psychiatric:     ?   Mood and Affect: Mood normal.     ?   Behavior: Behavior normal.  ? ? ? ? ?   ?Assessment & Plan:  ?1.Cervical spinal stenosis without evidence of radiculopathy. Chronic neck pain: Refilled Tylenol #4  300/60mg  daily, #30. 10/25/2021. ?We will continue the opioid monitoring program, this consists of regular clinic visits, examinations, urine drug screen, pill counts as well as use of [place] Controlled Substance Reporting system. A 12 month History has been reviewed on the [place] Controlled Substance Reporting System on 10/25/2021. ?2. Right shoulder rotator cuff tear, status post repair with chronic pain and contracture : Continue with exercise regime as tolerated .10/25/2021. ?3. Insomnia: Continue Melatonin. 10/25/2021 ?4. Chronic Bilateral Lower Back Pain without Sciatica: No complaints today. Continue HEP as tolerated. Continue to Monitor. 10/25/2020 ? 5. Right Greater Trochanter Bursitis: No complaints Today. Continue to Alternate Ice and Heat Therapy. Continue to Monitor 10/25/2021 ?6. Chronic right Sided Thoracic Back Pain: Continue current medication regime. Continue HEP as tolerated. Continue to monitor. 10/25/2021 ?  ?F/U in 3  months ?  ?  ? ?

## 2021-10-26 ENCOUNTER — Other Ambulatory Visit: Payer: Self-pay | Admitting: *Deleted

## 2021-10-26 ENCOUNTER — Ambulatory Visit
Admission: RE | Admit: 2021-10-26 | Discharge: 2021-10-26 | Disposition: A | Discharge status: Home / Self Care | Payer: Medicare Other | Source: Ambulatory Visit | Attending: Radiation Oncology | Admitting: Radiation Oncology

## 2021-10-26 DIAGNOSIS — C7951 Secondary malignant neoplasm of bone: Secondary | ICD-10-CM | POA: Diagnosis not present

## 2021-10-26 DIAGNOSIS — Z95828 Presence of other vascular implants and grafts: Secondary | ICD-10-CM

## 2021-10-26 DIAGNOSIS — C3490 Malignant neoplasm of unspecified part of unspecified bronchus or lung: Secondary | ICD-10-CM | POA: Diagnosis not present

## 2021-10-26 DIAGNOSIS — C7931 Secondary malignant neoplasm of brain: Secondary | ICD-10-CM

## 2021-10-26 MED ORDER — SODIUM CHLORIDE 0.9% FLUSH
10.0000 mL | INTRAVENOUS | Status: DC | PRN
  Administered 2021-10-26: 10 mL via INTRAVENOUS

## 2021-10-26 MED ORDER — GADOBENATE DIMEGLUMINE 529 MG/ML IV SOLN
15.0000 mL | Freq: Once | INTRAVENOUS | Status: AC | PRN
  Administered 2021-10-26: 15 mL via INTRAVENOUS

## 2021-10-26 MED ORDER — HEPARIN SOD (PORK) LOCK FLUSH 100 UNIT/ML IV SOLN: 500.0000 [IU] | Freq: Once | INTRAVENOUS | Status: DC

## 2021-10-27 ENCOUNTER — Other Ambulatory Visit: Payer: Self-pay

## 2021-10-27 ENCOUNTER — Encounter: Payer: Self-pay | Admitting: Internal Medicine

## 2021-10-27 ENCOUNTER — Ambulatory Visit (INDEPENDENT_AMBULATORY_CARE_PROVIDER_SITE_OTHER): Payer: Medicare Other | Admitting: Internal Medicine

## 2021-10-27 ENCOUNTER — Other Ambulatory Visit: Payer: Self-pay | Admitting: Registered Nurse

## 2021-10-27 VITALS — BP 120/82 | HR 99 | Ht 68.0 in | Wt 174.4 lb

## 2021-10-27 DIAGNOSIS — R000 Tachycardia, unspecified: Secondary | ICD-10-CM

## 2021-10-27 NOTE — Progress Notes (Signed)
Cardiology Office Note:    Date:  10/27/2021   ID:  Susan Cortez, DOB 14-Sep-1960, MRN [id number]  PCP:  Darreld Mclean, MD   [facility] Providers Cardiologist:  None     Referring MD: Darreld Mclean, MD   No chief complaint on file. Tachycardia  History of Present Illness:    Susan Cortez is a 61 y.o. female with a hx of limited stage small cell carcinoma diagnosed 09/2020 of the RUL s/p chemoradiation  completed on Spring of 2022, whole brain radiaiton completed 03/28/21 referral for tachycardia   She's prescribed albuterol but she does not use it. She's on metoprolol 12.5 mg XL. She feels weak and dizzy with it. She stays in bed. She is very fatigued. She is working on hydration. She can feel her heart rate racing at times. Her smart watch says sinus rhythm. She denies syncope. She gets some LH/dizzy with standing up.   She denies fevers,  dehydration, pain or anxiety. She has some loss of appetite. She has persistent neuropathy. She's had sinus tachycardia going back to February of 2018. She has not seen a cardiology, no stress test approximately 37 years ago which was normal. No echo. She's had CT scans , showing no significant atherosclerotic calcification.  05/29/2021- Sinus tachycardia HR 113 BPM, RAE  No left chest radiation, ECOG 0 Chemo-cisplatin, etoposide No metastasis  Family Hx: Mother- atrial fibrillation, atrial flutter, CHF, htn, hypothyroidism, CKD, HLD. Father - heart attack s/p PCI in his 85s. Brother had 2 Mis.  Social: smokes pack per day. No etoh, no drug use. She has three children.   Interim Hx: She notes some improvement with metoprolol. She is not planned for further cancer therapy. They are recommending repeat CT scan  Past Medical History:  Diagnosis Date   Allergy    Arthritis    Basal cell carcinoma 2018   Removed from Right neck per patient    Diverticulosis 2013   HLD (hyperlipidemia)    Hx of adenomatous colonic polyps 2013    OAB (overactive bladder)    Pre-diabetes    Small cell lung cancer ([place]) 11/09/2020    Past Surgical History:  Procedure Laterality Date   CESAREAN SECTION     X 3   COLONOSCOPY  2013, 2016, 2019   hx colon polyps   DILATION AND CURETTAGE OF UTERUS     FUDUCIAL PLACEMENT Right 10/18/2020   Procedure: PLACEMENT OF FUDUCIAL;  Surgeon: Collene Gobble, MD;  Location: [place];  Service: Thoracic;  Laterality: Right;   IR IMAGING GUIDED PORT INSERTION  11/29/2020   SHOULDER ARTHROSCOPY WITH OPEN ROTATOR CUFF REPAIR Right 05/2009   TONSILLECTOMY  1977   TUBAL LIGATION     VIDEO BRONCHOSCOPY WITH ENDOBRONCHIAL NAVIGATION N/A 10/18/2020   Procedure: VIDEO BRONCHOSCOPY WITH ENDOBRONCHIAL NAVIGATION;  Surgeon: Collene Gobble, MD;  [facility];  Service: Thoracic;  Laterality: N/A;   VIDEO BRONCHOSCOPY WITH ENDOBRONCHIAL ULTRASOUND N/A 10/18/2020   Procedure: VIDEO BRONCHOSCOPY WITH ENDOBRONCHIAL ULTRASOUND;  Surgeon: Collene Gobble, MD;  [facility];  Service: Thoracic;  Laterality: N/A;   WISDOM TOOTH EXTRACTION      Current Medications: Current Meds  Medication Sig   acetaminophen-codeine (TYLENOL #4) 300-60 MG tablet Take 1 tablet by mouth daily.   CALCIUM PO Take 650 mg by mouth daily.   Cholecalciferol (VITAMIN D3) 125 MCG (5000 UT) CAPS Take 5,000 Units by mouth daily.   Cyanocobalamin (B-12) 100 MCG TABS  Investigational palmitoylethanolamide/placebo 400 MG capsule [id number] Take 400 mg by mouth daily. Take with food and swallow whole. Do not crush or open capsule. Store at room temperature. Keep container tightly closed.   lidocaine-prilocaine (EMLA) cream APPLY 1 APPLICATION TOPICALLY AS NEEDED.   LORazepam (ATIVAN) 1 MG tablet TAKE 0.5 TABLET BY MOUTH EVERY 8 HOURS. TAKE 1 TABLET 30 MINUTES PRIOR TO MRI OR RADIATION   lovastatin (MEVACOR) 20 MG tablet TAKE 1 TABLET BY MOUTH AT BEDTIME   memantine (NAMENDA) 10 MG tablet Take 1 tablet (10 mg total) by mouth 2 (two) times  daily.   metoprolol succinate (TOPROL-XL) 25 MG 24 hr tablet Take 0.5 tablets (12.5 mg total) by mouth daily.   Multiple Vitamins-Minerals (MULTIVITAMIN ADULT, MINERALS,) TABS    prochlorperazine (COMPAZINE) 10 MG tablet Take 1 tablet (10 mg total) by mouth every 6 (six) hours as needed.     Allergies:   Lyrica [pregabalin], Butrans [buprenorphine], Chantix [varenicline], Diclofenac, Doxycycline, Meloxicam, Tolmetin, and Nsaids   Social History   Socioeconomic History   Marital status: Divorced    Spouse name: Not on file   Number of children: Not on file   Years of education: Not on file   Highest education level: Not on file  Occupational History   Not on file  Tobacco Use   Smoking status: Every Day    Packs/day: 1.00    Years: 45.00    Pack years: 45.00    Types: Cigarettes   Smokeless tobacco: Never   Tobacco comments:    1 pack smoked per day ARJ 09/01/21  Vaping Use   Vaping Use: Former  Substance and Sexual Activity   Alcohol use: No   Drug use: No   Sexual activity: Not on file    Comment: Tubal Ligation  Other Topics Concern   Not on file  Social History Narrative   Not on file   Social Determinants of Health   Financial Resource Strain: Low Risk    Difficulty of Paying Living Expenses: Not hard at all  Food Insecurity: No Food Insecurity   Worried About Charity fundraiser in the Last Year: Never true   [place] in the Last Year: Never true  Transportation Needs: No Transportation Needs   Lack of Transportation (Medical): No   Lack of Transportation (Non-Medical): No  Physical Activity: Inactive   Days of Exercise per Week: 0 days   Minutes of Exercise per Session: 0 min  Stress: Stress Concern Present   Feeling of Stress : To some extent  Social Connections: Socially Isolated   Frequency of Communication with Friends and Family: More than three times a week   Frequency of Social Gatherings with Friends and Family: More than three times a week    Attends Religious Services: Never   Marine scientist or Organizations: No   Attends Music therapist: Never   Marital Status: Divorced     Family History: The patient's family history includes Diabetes in her father; Heart disease in her father; Hyperlipidemia in her father. There is no history of Colon cancer, Colon polyps, Esophageal cancer, Rectal cancer, or Stomach cancer.  ROS:   Please see the history of present illness.     All other systems reviewed and are negative.  EKGs/Labs/Other Studies Reviewed:    The following studies were reviewed today:   EKG:  EKG is  ordered today.  The ekg ordered today demonstrates   Sinus tachycardia HR 114  bpm  Recent Labs: 02/21/2021: Magnesium 1.9 05/29/2021: TSH 2.615 08/29/2021: ALT 19; BUN 12; Creatinine 0.83; Hemoglobin 11.5; Platelet Count 334; Potassium 3.9; Sodium 138  Recent Lipid Panel    Component Value Date/Time   CHOL 168 06/14/2021 0847   TRIG 145.0 06/14/2021 0847   HDL 51.40 06/14/2021 0847   CHOLHDL 3 06/14/2021 0847   VLDL 29.0 06/14/2021 0847   LDLCALC 88 06/14/2021 0847   LDLCALC 94 06/08/2020 1050     Risk Assessment/Calculations:           Physical Exam:    VS:  BP 120/82    Pulse 99    Ht 5\' 8"  (1.727 m)    Wt 174 lb 6.4 oz (79.1 kg)    LMP  (LMP Unknown)    SpO2 98%    BMI 26.52 kg/m     Wt Readings from Last 3 Encounters:  10/27/21 174 lb 6.4 oz (79.1 kg)  10/25/21 175 lb (79.4 kg)  09/01/21 178 lb 3.2 oz (80.8 kg)     GEN:  Well nourished, well developed in no acute distress. Bald, post radiation HEENT: Normal NECK: No JVD; No carotid bruits LYMPHATICS: No lymphadenopathy CARDIAC: tachycardia, no murmurs, rubs, gallops RESPIRATORY:  Clear to auscultation without rales, wheezing or rhonchi  ABDOMEN: Soft, non-tender, non-distended MUSCULOSKELETAL:  No edema; No deformity  SKIN: Warm and dry NEUROLOGIC:  Alert and oriented x 3 PSYCHIATRIC:  Normal affect   ASSESSMENT:     #Sinus tachycardia: She has persistent sinus tachycardia. Can be related to being intravascularly dry with orthostatic symptoms; however she's had high rates going back to 2018. No medications are contributing. She has no structural heart disease. Malignancy can increase sympathetic tone, this may contribute (Majerova et al.  Increased sympathetic modulation in breast cancer survivors determined by measurement of heart rate variability. Nature/Scientific Reports. 2022). This may be due to inappropriate sinus tachycardia. She continues to smoke, I addressed this again today. May have signs of POTS. I included tips to help if this is the case.  #Cardio-onc: Cisplatin is associated with vascular endothelial dysfunction and can increased CVD risk, study population are patients with testicular cancer. Lysbeth Galas et al. Comprehensive Characterization of the Vascular Effects of Cisplatin-Based Chemotherapy in Patients With Testicular Cancer. JACC Cardio-Onc 2020)   PLAN:    In order of problems listed above:  Continue metoprolol 12.5 mg XL Follow up 6 months      Medication Adjustments/Labs and Tests Ordered: Current medicines are reviewed at length with the patient today.  Concerns regarding medicines are outlined above.   Signed, Janina Mayo, MD  10/27/2021 10:30 AM    [facility]

## 2021-10-27 NOTE — Patient Instructions (Signed)
Medication Instructions:  ?Your physician recommends that you continue on your current medications as directed. Please refer to the Current Medication list given to you today. ? ?*If you need a refill on your cardiac medications before your next appointment, please call your pharmacy* ? ?Follow-Up: ?At [facility], you and your health needs are our priority.  As part of our continuing mission to provide you with exceptional heart care, we have created designated Provider Care Teams.  These Care Teams include your primary Cardiologist (physician) and Advanced Practice Providers (APPs -  Physician Assistants and Nurse Practitioners) who all work together to provide you with the care you need, when you need it. ? ?We recommend signing up for the patient portal called "MyChart".  Sign up information is provided on this After Visit Summary.  MyChart is used to connect with patients for Virtual Visits (Telemedicine).  Patients are able to view lab/test results, encounter notes, upcoming appointments, etc.  Non-urgent messages can be sent to your provider as well.   ?To learn more about what you can do with MyChart, go to [email or web address].   ? ?Your next appointment:   ?6 month(s) ? ?The format for your next appointment:   ?In Person ? ?Provider:   ?Dr. Harl Bowie ? ? ?To help reduce symptoms with position changes you can increase your fluid intake. Can increase your salt intake. Can incorporate more exercise in your daily routine. Also please work on quitting smoking. ?

## 2021-10-28 LAB — TOXASSURE SELECT,+ANTIDEPR,UR

## 2021-10-30 ENCOUNTER — Inpatient Hospital Stay: Payer: Medicare Other | Attending: Physician Assistant

## 2021-10-30 ENCOUNTER — Ambulatory Visit
Admission: RE | Admit: 2021-10-30 | Discharge: 2021-10-30 | Disposition: A | Discharge status: Home / Self Care | Payer: Medicare Other | Source: Ambulatory Visit | Attending: Radiation Oncology | Admitting: Radiation Oncology

## 2021-10-30 ENCOUNTER — Other Ambulatory Visit: Payer: Self-pay

## 2021-10-30 ENCOUNTER — Telehealth: Payer: Self-pay | Admitting: *Deleted

## 2021-10-30 DIAGNOSIS — C3491 Malignant neoplasm of unspecified part of right bronchus or lung: Secondary | ICD-10-CM

## 2021-10-30 DIAGNOSIS — C3411 Malignant neoplasm of upper lobe, right bronchus or lung: Secondary | ICD-10-CM | POA: Diagnosis not present

## 2021-10-30 DIAGNOSIS — Z452 Encounter for adjustment and management of vascular access device: Secondary | ICD-10-CM | POA: Insufficient documentation

## 2021-10-30 DIAGNOSIS — Z08 Encounter for follow-up examination after completed treatment for malignant neoplasm: Secondary | ICD-10-CM | POA: Diagnosis not present

## 2021-10-30 NOTE — Telephone Encounter (Signed)
Urine drug screen for this encounter is consistent for prescribed medication 

## 2021-10-30 NOTE — Progress Notes (Signed)
Radiation Oncology         [phone]) [phone] ________________________________  Outpatient Follow Up- Conducted via telephone due to current COVID-19 concerns for limiting patient exposure  I spoke with the patient to conduct this consult visit via telephone to spare the patient unnecessary potential exposure in the healthcare setting during the current COVID-19 pandemic. The patient was notified in advance and was offered a [place] meeting to allow for face to face communication but unfortunately reported that they did not have the appropriate resources/technology to support such a visit and instead preferred to proceed with a telephone visit.   ________________________________  Name: Susan Cortez        MRN: [id number]  Date of Service: 10/30/2021 DOB: 11-Feb-1961  GG:YIRSWNI, Gay Filler, MD  Copland, Gay Filler, MD     REFERRING PHYSICIAN: Copland, Gay Filler, MD   DIAGNOSIS: The encounter diagnosis was Small cell carcinoma of right lung ([place]).   HISTORY OF PRESENT ILLNESS: Susan Cortez is a 61 y.o. female with a history of limited stage small cell carcinoma of the right upper lobe involving suprahilar nodule/adenopathy and right paractracheal adenopathy. A biopsy and cytology on 10/18/20 showed malignant cells in the right upper lobe brushings and fine-needle aspirate, atypical cells were seen in the 11 R node by fine-needle aspiration and malignant cells consistent with small cell carcinoma were seen in the level 4R and 10 R nodes as well.  She received chemoradiation which she completed on  01/04/21.   She was counseled on prophylactic cranial irradiation and prior to treatment had a negative MRI of the brain. She did have a lesion in the calvarium that was originally read as possibly metastatic, but after further discussion with neuroradiology, this was felt to be a benign appearing hemangioma. She went on to proceed with whole brain radiation with Namenda. She completed radiation to the brain  on 03/28/21. She completed Namenda in January 2023. She had repeat imaging of the chest in January 2023 and she has stable findings and continues in surveillance with Dr. Julien Nordmann. Her MRI brain on 10/26/21 showed no metastatic brain disease, but the known lesion in the left parietal lobe. This was confirmed during brain oncology conference this am.  She's contacted by phone to review these results.  On review of systems, the patient reports that she is doing well. She continues to have lower extremity peripheral neuropathy and this has not responded to vitamin B6 or the PEA that Dr. Julien Nordmann suggested. She cannot take lyrica or neurontin due to hypersensitivity. She reports muffled hearing on the right side and some sinus drainage. She continues to have fatigue that is unchanged. No headaches, visual or movement dysfunction is noted. No other complaints are verbalized.     PREVIOUS RADIATION THERAPY:   03/15/2021 through 03/28/2021 Site Technique Total Dose (Gy) Dose per Fx (Gy) Completed Fx Beam Energies  Brain: Brain Whole Brain Complex 25/25 2.5 10/10 6X   11/21/2020 through 01/04/2021 Site Technique Total Dose (Gy) Dose per Fx (Gy) Completed Fx Beam Energies  Lung, Right: Lung_Rt 3D 60/60 2 30/30 6X, 10X  Lung, Right: Lung_Rt_Bst 3D 6/6 2 3/3 6X, 10X    PAST MEDICAL HISTORY:  Past Medical History:  Diagnosis Date   Allergy    Arthritis    Basal cell carcinoma 2018   Removed from Right neck per patient    Diverticulosis 2013   HLD (hyperlipidemia)    Hx of adenomatous colonic polyps 2013   OAB (overactive bladder)  Pre-diabetes    Small cell lung cancer ([place]) 11/09/2020       PAST SURGICAL HISTORY: Past Surgical History:  Procedure Laterality Date   CESAREAN SECTION     X 3   COLONOSCOPY  2013, 2016, 2019   hx colon polyps   DILATION AND CURETTAGE OF UTERUS     FUDUCIAL PLACEMENT Right 10/18/2020   Procedure: PLACEMENT OF FUDUCIAL;  Surgeon: Collene Gobble, MD;  Location: [facility];   Service: Thoracic;  Laterality: Right;   IR IMAGING GUIDED PORT INSERTION  11/29/2020   SHOULDER ARTHROSCOPY WITH OPEN ROTATOR CUFF REPAIR Right 05/2009   TONSILLECTOMY  1977   TUBAL LIGATION     VIDEO BRONCHOSCOPY WITH ENDOBRONCHIAL NAVIGATION N/A 10/18/2020   Procedure: VIDEO BRONCHOSCOPY WITH ENDOBRONCHIAL NAVIGATION;  Surgeon: Collene Gobble, MD;  [facility];  Service: Thoracic;  Laterality: N/A;   VIDEO BRONCHOSCOPY WITH ENDOBRONCHIAL ULTRASOUND N/A 10/18/2020   Procedure: VIDEO BRONCHOSCOPY WITH ENDOBRONCHIAL ULTRASOUND;  Surgeon: Collene Gobble, MD;  Location[facility];  Service: Thoracic;  Laterality: N/A;   WISDOM TOOTH EXTRACTION       FAMILY HISTORY:  Family History  Problem Relation Age of Onset   Diabetes Father    Heart disease Father    Hyperlipidemia Father    Colon cancer Neg Hx    Colon polyps Neg Hx    Esophageal cancer Neg Hx    Rectal cancer Neg Hx    Stomach cancer Neg Hx      SOCIAL HISTORY:  reports that she has been smoking cigarettes. She has a 45.00 pack-year smoking history. She has never used smokeless tobacco. She reports that she does not drink alcohol and does not use drugs. The patient is divorced and lives in [place]. She is originally from [place] and enjoys traveling home to [place] periodically to see friends and family.     ALLERGIES: Lyrica [pregabalin], Butrans [buprenorphine], Chantix [varenicline], Diclofenac, Doxycycline, Meloxicam, Tolmetin, and Nsaids   MEDICATIONS:  Current Outpatient Medications  Medication Sig Dispense Refill   CALCIUM PO Take 650 mg by mouth daily.     Cholecalciferol (VITAMIN D3) 125 MCG (5000 UT) CAPS Take 5,000 Units by mouth daily.     Cyanocobalamin (B-12) 100 MCG TABS      lidocaine-prilocaine (EMLA) cream APPLY 1 APPLICATION TOPICALLY AS NEEDED. 30 g 0   LORazepam (ATIVAN) 1 MG tablet TAKE 0.5 TABLET BY MOUTH EVERY 8 HOURS. TAKE 1 TABLET 30 MINUTES PRIOR TO MRI OR RADIATION 14 tablet 0   lovastatin  (MEVACOR) 20 MG tablet TAKE 1 TABLET BY MOUTH AT BEDTIME 90 tablet 1   metoprolol succinate (TOPROL-XL) 25 MG 24 hr tablet Take 0.5 tablets (12.5 mg total) by mouth daily. 45 tablet 3   Multiple Vitamins-Minerals (MULTIVITAMIN ADULT, MINERALS,) TABS      acetaminophen-codeine (TYLENOL #4) 300-60 MG tablet Take 1 tablet by mouth daily. 30 tablet 2   Investigational palmitoylethanolamide/placebo 400 MG capsule ACCRU-[place]-2102 Take 400 mg by mouth daily. Take with food and swallow whole. Do not crush or open capsule. Store at room temperature. Keep container tightly closed.     memantine (NAMENDA) 10 MG tablet Take 1 tablet (10 mg total) by mouth 2 (two) times daily. 60 tablet 4   prochlorperazine (COMPAZINE) 10 MG tablet Take 1 tablet (10 mg total) by mouth every 6 (six) hours as needed. 30 tablet 2   No current facility-administered medications for this encounter.  PHYSICAL EXAM:  Unable to assess  due to encounter type.    ECOG = 1  0 - Asymptomatic (Fully active, able to carry on all predisease activities without restriction)  1 - Symptomatic but completely ambulatory (Restricted in physically strenuous activity but ambulatory and able to carry out work of a light or sedentary nature. For example, light housework, office work)  2 - Symptomatic, <50% in bed during the day (Ambulatory and capable of all self care but unable to carry out any work activities. Up and about more than 50% of waking hours)  3 - Symptomatic, >50% in bed, but not bedbound (Capable of only limited self-care, confined to bed or chair 50% or more of waking hours)  4 - Bedbound (Completely disabled. Cannot carry on any self-care. Totally confined to bed or chair)  5 - Death   Eustace Pen MM, Creech RH, Tormey DC, et al. [phone]). "Toxicity and response criteria of the [facility] Group". [place] Oncol. 5 (6): 649-55    LABORATORY DATA:  Lab Results  Component Value Date   WBC 9.1 08/29/2021    HGB 11.5 (L) 08/29/2021   HCT 34.1 (L) 08/29/2021   MCV 94.7 08/29/2021   PLT 334 08/29/2021   Lab Results  Component Value Date   NA 138 08/29/2021   K 3.9 08/29/2021   CL 105 08/29/2021   CO2 26 08/29/2021   Lab Results  Component Value Date   ALT 19 08/29/2021   AST 23 08/29/2021   ALKPHOS 49 08/29/2021   BILITOT 0.3 08/29/2021      RADIOGRAPHY: MR Brain W Wo Contrast  Result Date: 10/26/2021 CLINICAL DATA:  Metastatic lung cancer staging EXAM: MRI HEAD WITHOUT AND WITH CONTRAST TECHNIQUE: Multiplanar, multiecho pulse sequences of the brain and surrounding structures were obtained without and with intravenous contrast. CONTRAST:  70mL MULTIHANCE GADOBENATE DIMEGLUMINE 529 MG/ML IV SOLN COMPARISON:  MRI head 06/29/2021, 11/05/2020 FINDINGS: Brain: Ventricle size and cerebral volume normal. No intracranial mass. No enhancing metastatic disease in the brain. Solitary small hyperintensity in the left frontal lobe unchanged. No acute infarct. Negative for hemorrhage or mass. Normal enhancement postcontrast administration. Vascular: Normal arterial flow voids Skull and upper cervical spine: 3 cm enhancing lesion in the left parietal bone marrow. This is most consistent with metastatic disease. This appears slightly larger compared with the MRI of 11/05/2020 Sinuses/Orbits: Paranasal sinuses clear. Right mastoid effusion. Negative orbit Other: None IMPRESSION: Negative for metastatic disease to the brain 3 cm enhancing lesion left parietal calvarium consistent with metastatic disease. Electronically Signed   By: Franchot Gallo M.D.   On: 10/26/2021 16:16        IMPRESSION/PLAN: 1. Limited Stage Small Cell Carcinoma of the RUL. Again she continues to be stable both in the chest and in her brain. We will plan to repeat her MRI in 3 months time, or sooner if she develops concerns or symptoms prior to that visit. She will also follow up with Dr. Worthy Flank team in surveillance.  2. Left  Parietal Calvarial Hemangioma. We will continue to follow this but despite the read on her MRI the lesion is stable per discussion in brain oncology conference and is most consistent with benign hemangioma. 3. Peripheral neuropathy. This may again be from prior chemotherapy. I suggested she try Vitamin B6 supplementation but this has not improved. I encouraged her to discuss with Dr. Julien Nordmann as well. 4. Right Mastoid Effusion. She will try OTC Sudafed and if this hasn't improved  in 2 weeks, we would consider a medrol dose pack.  Given current concerns for patient exposure during the COVID-19 pandemic, this encounter was conducted via telephone.  The patient has provided two factor identification and has given verbal consent for this type of encounter and has been advised to only accept a meeting of this type in a secure network environment. The time spent during this encounter was 30 minutes including preparation, discussion, and coordination of the patient's care. The attendants for this meeting included Hayden Pedro  and [name].  During the encounter,  Hayden Pedro was located at [facility] Radiation Oncology Department.  Susan Cortez was located at home.       Carola Rhine, [facility]   **Disclaimer: This note was dictated with voice recognition software. Similar sounding words can inadvertently be transcribed and this note may contain transcription errors which may not have been corrected upon publication of note.**

## 2021-11-20 ENCOUNTER — Other Ambulatory Visit: Payer: Self-pay

## 2021-11-20 ENCOUNTER — Inpatient Hospital Stay: Payer: Medicare Other

## 2021-11-20 DIAGNOSIS — C3491 Malignant neoplasm of unspecified part of right bronchus or lung: Secondary | ICD-10-CM

## 2021-11-20 DIAGNOSIS — Z452 Encounter for adjustment and management of vascular access device: Secondary | ICD-10-CM | POA: Diagnosis not present

## 2021-11-20 DIAGNOSIS — C3411 Malignant neoplasm of upper lobe, right bronchus or lung: Secondary | ICD-10-CM | POA: Diagnosis not present

## 2021-11-20 DIAGNOSIS — Z95828 Presence of other vascular implants and grafts: Secondary | ICD-10-CM

## 2021-11-20 MED ORDER — HEPARIN SOD (PORK) LOCK FLUSH 100 UNIT/ML IV SOLN
500.0000 [IU] | Freq: Once | INTRAVENOUS | Status: AC
  Administered 2021-11-20: 500 [IU]

## 2021-11-20 MED ORDER — SODIUM CHLORIDE 0.9% FLUSH
10.0000 mL | Freq: Once | INTRAVENOUS | Status: AC
  Administered 2021-11-20: 10 mL

## 2021-12-01 ENCOUNTER — Other Ambulatory Visit: Payer: Self-pay | Admitting: Radiation Therapy

## 2021-12-01 DIAGNOSIS — C7931 Secondary malignant neoplasm of brain: Secondary | ICD-10-CM

## 2021-12-27 ENCOUNTER — Inpatient Hospital Stay: Payer: Medicare Other

## 2021-12-27 ENCOUNTER — Other Ambulatory Visit: Payer: Commercial Managed Care - HMO

## 2021-12-28 ENCOUNTER — Inpatient Hospital Stay: Payer: Medicare Other | Attending: Physician Assistant

## 2021-12-28 ENCOUNTER — Ambulatory Visit (HOSPITAL_COMMUNITY)
Admission: RE | Admit: 2021-12-28 | Discharge: 2021-12-28 | Disposition: A | Discharge status: Home / Self Care | Payer: Medicare Other | Source: Ambulatory Visit | Attending: Internal Medicine | Admitting: Internal Medicine

## 2021-12-28 DIAGNOSIS — Z85828 Personal history of other malignant neoplasm of skin: Secondary | ICD-10-CM | POA: Insufficient documentation

## 2021-12-28 DIAGNOSIS — E785 Hyperlipidemia, unspecified: Secondary | ICD-10-CM | POA: Insufficient documentation

## 2021-12-28 DIAGNOSIS — N3281 Overactive bladder: Secondary | ICD-10-CM | POA: Insufficient documentation

## 2021-12-28 DIAGNOSIS — C3491 Malignant neoplasm of unspecified part of right bronchus or lung: Secondary | ICD-10-CM | POA: Insufficient documentation

## 2021-12-28 DIAGNOSIS — Z79899 Other long term (current) drug therapy: Secondary | ICD-10-CM | POA: Insufficient documentation

## 2021-12-28 DIAGNOSIS — Z9221 Personal history of antineoplastic chemotherapy: Secondary | ICD-10-CM | POA: Insufficient documentation

## 2021-12-28 DIAGNOSIS — C3490 Malignant neoplasm of unspecified part of unspecified bronchus or lung: Secondary | ICD-10-CM | POA: Diagnosis not present

## 2021-12-28 DIAGNOSIS — D3502 Benign neoplasm of left adrenal gland: Secondary | ICD-10-CM | POA: Insufficient documentation

## 2021-12-28 DIAGNOSIS — C3411 Malignant neoplasm of upper lobe, right bronchus or lung: Secondary | ICD-10-CM | POA: Insufficient documentation

## 2021-12-28 DIAGNOSIS — Z86010 Personal history of colonic polyps: Secondary | ICD-10-CM | POA: Insufficient documentation

## 2021-12-28 DIAGNOSIS — J432 Centrilobular emphysema: Secondary | ICD-10-CM | POA: Insufficient documentation

## 2021-12-28 DIAGNOSIS — Z95828 Presence of other vascular implants and grafts: Secondary | ICD-10-CM

## 2021-12-28 DIAGNOSIS — I700 Atherosclerosis of aorta: Secondary | ICD-10-CM | POA: Insufficient documentation

## 2021-12-28 DIAGNOSIS — G629 Polyneuropathy, unspecified: Secondary | ICD-10-CM | POA: Insufficient documentation

## 2021-12-28 LAB — CMP (CANCER CENTER ONLY)
ALT: 19 U/L (ref 0–44)
AST: 19 U/L (ref 15–41)
Albumin: 4.4 g/dL (ref 3.5–5.0)
Alkaline Phosphatase: 55 U/L (ref 38–126)
Anion gap: 6 (ref 5–15)
BUN: 11 mg/dL (ref 8–23)
CO2: 27 mmol/L (ref 22–32)
Calcium: 10.3 mg/dL (ref 8.9–10.3)
Chloride: 104 mmol/L (ref 98–111)
Creatinine: 0.75 mg/dL (ref 0.44–1.00)
GFR, Estimated: >60 mL/min (ref >60)
Glucose, Bld: 107 mg/dL — ABNORMAL HIGH (ref 70–99)
Potassium: 3.9 mmol/L (ref 3.5–5.1)
Sodium: 137 mmol/L (ref 135–145)
Total Bilirubin: 0.4 mg/dL (ref 0.3–1.2)
Total Protein: 7.1 g/dL (ref 6.5–8.1)

## 2021-12-28 LAB — CBC WITH DIFFERENTIAL (CANCER CENTER ONLY)
Abs Immature Granulocytes: 0.01 10*3/uL (ref 0.00–0.07)
Basophils Absolute: 0.1 10*3/uL (ref 0.0–0.1)
Basophils Relative: 1 %
Eosinophils Absolute: 0.1 10*3/uL (ref 0.0–0.5)
Eosinophils Relative: 1 %
HCT: 38.4 % (ref 36.0–46.0)
Hemoglobin: 12.8 g/dL (ref 12.0–15.0)
Immature Granulocytes: 0 %
Lymphocytes Relative: 28 %
Lymphs Abs: 2 10*3/uL (ref 0.7–4.0)
MCH: 31.4 pg (ref 26.0–34.0)
MCHC: 33.3 g/dL (ref 30.0–36.0)
MCV: 94.3 fL (ref 80.0–100.0)
Monocytes Absolute: 0.6 10*3/uL (ref 0.1–1.0)
Monocytes Relative: 8 %
Neutro Abs: 4.5 10*3/uL (ref 1.7–7.7)
Neutrophils Relative %: 62 %
Platelet Count: 330 10*3/uL (ref 150–400)
RBC: 4.07 MIL/uL (ref 3.87–5.11)
RDW: 13.7 % (ref 11.5–15.5)
WBC Count: 7.2 10*3/uL (ref 4.0–10.5)
nRBC: 0 % (ref 0.0–0.2)

## 2021-12-28 MED ORDER — IOHEXOL 300 MG/ML  SOLN
75.0000 mL | Freq: Once | INTRAMUSCULAR | Status: AC | PRN
  Administered 2021-12-28: 75 mL via INTRAVENOUS

## 2021-12-28 MED ORDER — SODIUM CHLORIDE 0.9% FLUSH
10.0000 mL | Freq: Once | INTRAVENOUS | Status: AC
  Administered 2021-12-28: 10 mL

## 2021-12-28 MED ORDER — SODIUM CHLORIDE (PF) 0.9 % IJ SOLN
INTRAMUSCULAR | Status: AC
  Filled 2021-12-28: qty 50

## 2021-12-28 MED ORDER — HEPARIN SOD (PORK) LOCK FLUSH 100 UNIT/ML IV SOLN
INTRAVENOUS | Status: AC
  Filled 2021-12-28: qty 5

## 2021-12-28 MED ORDER — HEPARIN SOD (PORK) LOCK FLUSH 100 UNIT/ML IV SOLN
500.0000 [IU] | Freq: Once | INTRAVENOUS | Status: AC
  Administered 2021-12-28: 500 [IU] via INTRAVENOUS

## 2022-01-01 ENCOUNTER — Other Ambulatory Visit: Payer: Self-pay | Admitting: Medical Oncology

## 2022-01-01 ENCOUNTER — Other Ambulatory Visit: Payer: Self-pay

## 2022-01-01 ENCOUNTER — Inpatient Hospital Stay (HOSPITAL_BASED_OUTPATIENT_CLINIC_OR_DEPARTMENT_OTHER): Payer: Medicare Other | Admitting: Internal Medicine

## 2022-01-01 VITALS — BP 132/98 | HR 98 | Temp 97.9°F | Resp 16 | Wt 177.2 lb

## 2022-01-01 DIAGNOSIS — J432 Centrilobular emphysema: Secondary | ICD-10-CM | POA: Diagnosis not present

## 2022-01-01 DIAGNOSIS — I700 Atherosclerosis of aorta: Secondary | ICD-10-CM | POA: Diagnosis not present

## 2022-01-01 DIAGNOSIS — C3411 Malignant neoplasm of upper lobe, right bronchus or lung: Secondary | ICD-10-CM | POA: Diagnosis not present

## 2022-01-01 DIAGNOSIS — G629 Polyneuropathy, unspecified: Secondary | ICD-10-CM | POA: Diagnosis not present

## 2022-01-01 DIAGNOSIS — D3502 Benign neoplasm of left adrenal gland: Secondary | ICD-10-CM | POA: Diagnosis not present

## 2022-01-01 DIAGNOSIS — Z9221 Personal history of antineoplastic chemotherapy: Secondary | ICD-10-CM | POA: Diagnosis not present

## 2022-01-01 DIAGNOSIS — N3281 Overactive bladder: Secondary | ICD-10-CM | POA: Diagnosis not present

## 2022-01-01 DIAGNOSIS — C3490 Malignant neoplasm of unspecified part of unspecified bronchus or lung: Secondary | ICD-10-CM | POA: Diagnosis not present

## 2022-01-01 DIAGNOSIS — C3491 Malignant neoplasm of unspecified part of right bronchus or lung: Secondary | ICD-10-CM

## 2022-01-01 DIAGNOSIS — E785 Hyperlipidemia, unspecified: Secondary | ICD-10-CM | POA: Diagnosis not present

## 2022-01-01 DIAGNOSIS — Z85828 Personal history of other malignant neoplasm of skin: Secondary | ICD-10-CM | POA: Diagnosis not present

## 2022-01-01 DIAGNOSIS — Z79899 Other long term (current) drug therapy: Secondary | ICD-10-CM | POA: Diagnosis not present

## 2022-01-01 DIAGNOSIS — Z86010 Personal history of colonic polyps: Secondary | ICD-10-CM | POA: Diagnosis not present

## 2022-01-01 MED ORDER — GABAPENTIN 100 MG PO CAPS: 100.0000 mg | ORAL_CAPSULE | Freq: Two times a day (BID) | ORAL | 1 refills | Status: DC

## 2022-01-01 NOTE — Progress Notes (Signed)
?    [facility] ?Telephone:(336) [phone]   [phone] ? ?OFFICE PROGRESS NOTE ? ?Copland, Gay Filler, MD ?[address] ?[place] [address] ? ? ?DIAGNOSIS: Limited stage (T1c, N2, M0) Small Cell Lung Cancer. She presented with a right upper lobe suprahilar nodule/adenopathy, right paratracheal lymph node, and a hypermetabolic right upper lobe nodule. She was diagnosed in February 2022.  ?  ?PRIOR THERAPY:   ?1) Systemic chemotherapy with cisplatin 80 mg/m2 on day 1, etoposide 100 mg/m2 on days 1, 2, and 3 IV every 3 weeks with concurrent radiation.  Last dose 02/01/21. Status post 4 cycles.  ?2) prophylactic cranial irradiation under the care of Dr. Lisbeth Renshaw.  Last treatment on 03/28/2021 ?  ?CURRENT THERAPY: Observation  ? ?INTERVAL HISTORY: ?Susan Cortez 61 y.o. female returns to the clinic today for 4 months follow-up visit.  The patient is feeling fine today with no concerning complaints.  She denied having any chest pain, shortness of breath, cough or hemoptysis.  She has no nausea, vomiting, diarrhea or constipation.  She continues to complain of peripheral neuropathy.  She tried the over-the-counter PEA with no improvement.  She is interested in trial of gabapentin.  She denied having any headache or visual changes.  The patient had repeat CT scan of the chest performed recently and she is here for evaluation and discussion of her risk her results. ? ?MEDICAL HISTORY: ?Past Medical History:  ?Diagnosis Date  ? Allergy   ? Arthritis   ? Basal cell carcinoma 2018  ? Removed from Right neck per patient   ? Diverticulosis 2013  ? HLD (hyperlipidemia)   ? Hx of adenomatous colonic polyps 2013  ? OAB (overactive bladder)   ? Pre-diabetes   ? Small cell lung cancer ([place]) 11/09/2020  ? ? ?ALLERGIES:  is allergic to lyrica [pregabalin], butrans [buprenorphine], chantix [varenicline], diclofenac, doxycycline, meloxicam, tolmetin, and nsaids. ? ?MEDICATIONS:  ?Current Outpatient  Medications  ?Medication Sig Dispense Refill  ? acetaminophen-codeine (TYLENOL #4) 300-60 MG tablet Take 1 tablet by mouth daily. 30 tablet 2  ? CALCIUM PO Take 650 mg by mouth daily.    ? Cholecalciferol (VITAMIN D3) 125 MCG (5000 UT) CAPS Take 5,000 Units by mouth daily.    ? Cyanocobalamin (B-12) 100 MCG TABS     ? Investigational palmitoylethanolamide/placebo 400 MG capsule ACCRU-[place]-2102 Take 400 mg by mouth daily. Take with food and swallow whole. Do not crush or open capsule. Store at room temperature. Keep container tightly closed.    ? lidocaine-prilocaine (EMLA) cream APPLY 1 APPLICATION TOPICALLY AS NEEDED. 30 g 0  ? LORazepam (ATIVAN) 1 MG tablet TAKE 0.5 TABLET BY MOUTH EVERY 8 HOURS. TAKE 1 TABLET 30 MINUTES PRIOR TO MRI OR RADIATION 14 tablet 0  ? lovastatin (MEVACOR) 20 MG tablet TAKE 1 TABLET BY MOUTH AT BEDTIME 90 tablet 1  ? metoprolol succinate (TOPROL-XL) 25 MG 24 hr tablet Take 0.5 tablets (12.5 mg total) by mouth daily. 45 tablet 3  ? Multiple Vitamins-Minerals (MULTIVITAMIN ADULT, MINERALS,) TABS     ? prochlorperazine (COMPAZINE) 10 MG tablet Take 1 tablet (10 mg total) by mouth every 6 (six) hours as needed. 30 tablet 2  ? ?No current facility-administered medications for this visit.  ? ? ?SURGICAL HISTORY:  ?Past Surgical History:  ?Procedure Laterality Date  ? CESAREAN SECTION    ? X 3  ? COLONOSCOPY  2013, 2016, 2019  ? hx colon polyps  ? DILATION  AND CURETTAGE OF UTERUS    ? FUDUCIAL PLACEMENT Right 10/18/2020  ? Procedure: PLACEMENT OF FUDUCIAL;  Surgeon: Collene Gobble, MD;  Location: [facility];  Service: Thoracic;  Laterality: Right;  ? IR IMAGING GUIDED PORT INSERTION  11/29/2020  ? SHOULDER ARTHROSCOPY WITH OPEN ROTATOR CUFF REPAIR Right 05/2009  ? TONSILLECTOMY  1977  ? TUBAL LIGATION    ? VIDEO BRONCHOSCOPY WITH ENDOBRONCHIAL NAVIGATION N/A 10/18/2020  ? Procedure: VIDEO BRONCHOSCOPY WITH ENDOBRONCHIAL NAVIGATION;  Surgeon: Collene Gobble, MD;  [facility];  Service: Thoracic;   Laterality: N/A;  ? VIDEO BRONCHOSCOPY WITH ENDOBRONCHIAL ULTRASOUND N/A 10/18/2020  ? Procedure: VIDEO BRONCHOSCOPY WITH ENDOBRONCHIAL ULTRASOUND;  Surgeon: Collene Gobble, MD;  Location: [facility];  Service: Thoracic;  Laterality: N/A;  ? WISDOM TOOTH EXTRACTION    ? ? ?REVIEW OF SYSTEMS:  A comprehensive review of systems was negative except for: Neurological: positive for paresthesia  ? ?PHYSICAL EXAMINATION: General appearance: alert, cooperative, and no distress ?Head: Normocephalic, without obvious abnormality, atraumatic ?Neck: no adenopathy, no JVD, supple, symmetrical, trachea midline, and thyroid not enlarged, symmetric, no tenderness/mass/nodules ?Lymph nodes: Cervical, supraclavicular, and axillary nodes normal. ?Resp: clear to auscultation bilaterally ?Back: symmetric, no curvature. ROM normal. No CVA tenderness. ?Cardio: regular rate and rhythm, S1, S2 normal, no murmur, click, rub or gallop ?GI: soft, non-tender; bowel sounds normal; no masses,  no organomegaly ?Extremities: extremities normal, atraumatic, no cyanosis or edema ? ?ECOG PERFORMANCE STATUS: 1 - Symptomatic but completely ambulatory ? ?Blood pressure (!) 132/98, pulse 98, temperature 97.9 ?F (36.6 ?C), temperature source Tympanic, resp. rate 16, weight 177 lb 3 oz (80.4 kg), SpO2 100 %. ? ?LABORATORY DATA: ?Lab Results  ?Component Value Date  ? WBC 7.2 12/28/2021  ? HGB 12.8 12/28/2021  ? HCT 38.4 12/28/2021  ? MCV 94.3 12/28/2021  ? PLT 330 12/28/2021  ? ? ?  Chemistry   ?   ?Component Value Date/Time  ? NA 137 12/28/2021 1332  ? K 3.9 12/28/2021 1332  ? CL 104 12/28/2021 1332  ? CO2 27 12/28/2021 1332  ? BUN 11 12/28/2021 1332  ? CREATININE 0.75 12/28/2021 1332  ? CREATININE 0.65 06/08/2020 1050  ?    ?Component Value Date/Time  ? CALCIUM 10.3 12/28/2021 1332  ? ALKPHOS 55 12/28/2021 1332  ? AST 19 12/28/2021 1332  ? ALT 19 12/28/2021 1332  ? BILITOT 0.4 12/28/2021 1332  ?  ? ? ? ?RADIOGRAPHIC STUDIES: ?CT Chest W Contrast ? ?Result Date:  12/29/2021 ?CLINICAL DATA:  Follow up small cell lung cancer. * Tracking Code: BO * EXAM: CT CHEST WITH CONTRAST TECHNIQUE: Multidetector CT imaging of the chest was performed during intravenous contrast administration. RADIATION DOSE REDUCTION: This exam was performed according to the departmental dose-optimization program which includes automated exposure control, adjustment of the mA and/or kV according to patient size and/or use of iterative reconstruction technique. CONTRAST:  18mL OMNIPAQUE IOHEXOL 300 MG/ML  SOLN COMPARISON:  Chest CT 08/29/2021 and 05/29/2021 FINDINGS: Cardiovascular: No acute vascular findings. Atherosclerosis of the aorta, great vessels and coronary arteries. A right IJ Port-A-Cath extends to the superior cavoatrial junction. The heart size is normal. There is no pericardial effusion. Mediastinum/Nodes: There are no enlarged mediastinal, hilar or axillary lymph nodes.Stable small mediastinal lymph nodes. The thyroid gland, trachea and esophagus are unchanged, without significant findings. Lungs/Pleura: There is no pleural effusion. Moderate centrilobular emphysema with diffuse central airway thickening. There are improved ground-glass opacity surrounding the fiducial markers in the central right  upper lobe, consistent with resolving radiation changes. Scattered small pulmonary nodules are unchanged, all measuring less than 3 mm in diameter. No suspicious pulmonary nodules. Upper abdomen: The visualized upper abdomen appears stable without suspicious findings. There is stable left adrenal nodularity (incompletely visualized), previously characterized as adenomas. Musculoskeletal/Chest wall: There is no chest wall mass or suspicious osseous finding. Stable mild endplate degenerative changes in the spine. IMPRESSION: 1. Evolving radiation changes in the right perihilar region. 2. No evidence of local recurrence or metastatic disease. 3. Grossly stable left adrenal adenomas, incompletely  visualized. 4. Coronary and aortic atherosclerosis (ICD10-I70.0). Emphysema (ICD10-J43.9). Electronically Signed   By: Richardean Sale M.D.   On: 12/29/2021 17:04   ? ?ASSESSMENT AND PLAN: This is a very pleasant 61

## 2022-01-23 ENCOUNTER — Ambulatory Visit: Payer: Medicare Other | Admitting: Registered Nurse

## 2022-01-23 DIAGNOSIS — L905 Scar conditions and fibrosis of skin: Secondary | ICD-10-CM | POA: Diagnosis not present

## 2022-01-23 DIAGNOSIS — Z85828 Personal history of other malignant neoplasm of skin: Secondary | ICD-10-CM | POA: Diagnosis not present

## 2022-01-23 DIAGNOSIS — L309 Dermatitis, unspecified: Secondary | ICD-10-CM | POA: Diagnosis not present

## 2022-01-24 ENCOUNTER — Encounter: Payer: Self-pay | Admitting: Registered Nurse

## 2022-01-24 ENCOUNTER — Encounter: Payer: Medicare Other | Attending: Registered Nurse | Admitting: Registered Nurse

## 2022-01-24 VITALS — BP 127/84 | HR 114 | Ht 68.0 in | Wt 178.0 lb

## 2022-01-24 DIAGNOSIS — M5412 Radiculopathy, cervical region: Secondary | ICD-10-CM | POA: Diagnosis not present

## 2022-01-24 DIAGNOSIS — M542 Cervicalgia: Secondary | ICD-10-CM | POA: Insufficient documentation

## 2022-01-24 DIAGNOSIS — G894 Chronic pain syndrome: Secondary | ICD-10-CM | POA: Insufficient documentation

## 2022-01-24 DIAGNOSIS — Z79891 Long term (current) use of opiate analgesic: Secondary | ICD-10-CM | POA: Diagnosis not present

## 2022-01-24 DIAGNOSIS — M546 Pain in thoracic spine: Secondary | ICD-10-CM | POA: Insufficient documentation

## 2022-01-24 DIAGNOSIS — M545 Low back pain, unspecified: Secondary | ICD-10-CM | POA: Diagnosis not present

## 2022-01-24 DIAGNOSIS — M4802 Spinal stenosis, cervical region: Secondary | ICD-10-CM | POA: Insufficient documentation

## 2022-01-24 DIAGNOSIS — G8929 Other chronic pain: Secondary | ICD-10-CM | POA: Insufficient documentation

## 2022-01-24 DIAGNOSIS — Z5181 Encounter for therapeutic drug level monitoring: Secondary | ICD-10-CM | POA: Diagnosis not present

## 2022-01-24 DIAGNOSIS — M25511 Pain in right shoulder: Secondary | ICD-10-CM | POA: Insufficient documentation

## 2022-01-24 MED ORDER — ACETAMINOPHEN-CODEINE 300-60 MG PO TABS: 1.0000 | ORAL_TABLET | Freq: Every day | ORAL | 2 refills | Status: DC | PRN

## 2022-01-24 NOTE — Progress Notes (Signed)
Subjective:    Patient ID: Susan Cortez, female    DOB: February 19, 1961, 61 y.o.   MRN: [id number]  HPI: Susan Cortez is a 61 y.o. female who returns for follow up appointment for chronic pain and medication refill. She states her pain is located in her neck radiating into her right shoulder and upper- lower back pain. She rates her pain 5. Her current exercise regime is walking and performing stretching exercises.  Susan Cortez Morphine equivalent is 9.00 MME. She is also prescribed Lorazepam by Shona Simpson .We have discussed the black box warning of using opioids and benzodiazepines. I highlighted the dangers of using these drugs together and discussed the adverse events including respiratory suppression, overdose, cognitive impairment and importance of compliance with current regimen. We will continue to monitor and adjust as indicated.   Arrived Tachycardic apical pulse 100.  Last UDS was Performed on 10/25/2021, it was consistent.    Pain Inventory Average Pain 7 Pain Right Now 5 My pain is constant, burning, stabbing, and aching  In the last 24 hours, has pain interfered with the following? General activity 1 Relation with others 0 Enjoyment of life 1 What TIME of day is your pain at its worst? daytime and evening Sleep (in general) Poor  Pain is worse with: bending, standing, and some activites Pain improves with: rest, therapy/exercise, pacing activities, and medication Relief from Meds: 7  Family History  Problem Relation Age of Onset   Diabetes Father    Heart disease Father    Hyperlipidemia Father    Colon cancer Neg Hx    Colon polyps Neg Hx    Esophageal cancer Neg Hx    Rectal cancer Neg Hx    Stomach cancer Neg Hx    Social History   Socioeconomic History   Marital status: Divorced    Spouse name: Not on file   Number of children: Not on file   Years of education: Not on file   Highest education level: Not on file  Occupational History   Not on  file  Tobacco Use   Smoking status: Every Day    Packs/day: 1.00    Years: 45.00    Pack years: 45.00    Types: Cigarettes   Smokeless tobacco: Never   Tobacco comments:    1 pack smoked per day ARJ 09/01/21  Vaping Use   Vaping Use: Former  Substance and Sexual Activity   Alcohol use: No   Drug use: No   Sexual activity: Not on file    Comment: Tubal Ligation  Other Topics Concern   Not on file  Social History Narrative   Not on file   Social Determinants of Health   Financial Resource Strain: Low Risk    Difficulty of Paying Living Expenses: Not hard at all  Food Insecurity: No Food Insecurity   Worried About Charity fundraiser in the Last Year: Never true   Ran Out of Food in the Last Year: Never true  Transportation Needs: No Transportation Needs   Lack of Transportation (Medical): No   Lack of Transportation (Non-Medical): No  Physical Activity: Inactive   Days of Exercise per Week: 0 days   Minutes of Exercise per Session: 0 min  Stress: Stress Concern Present   Feeling of Stress : To some extent  Social Connections: Socially Isolated   Frequency of Communication with Friends and Family: More than three times a week   Frequency of Social Gatherings with Friends  and Family: More than three times a week   Attends Religious Services: Never   Active Member of Clubs or Organizations: No   Attends Archivist Meetings: Never   Marital Status: Divorced   Past Surgical History:  Procedure Laterality Date   CESAREAN SECTION     X 3   COLONOSCOPY  2013, 2016, 2019   hx colon polyps   DILATION AND CURETTAGE OF UTERUS     FUDUCIAL PLACEMENT Right 10/18/2020   Procedure: PLACEMENT OF FUDUCIAL;  Surgeon: Collene Gobble, MD;  Location: [place];  Service: Thoracic;  Laterality: Right;   IR IMAGING GUIDED PORT INSERTION  11/29/2020   SHOULDER ARTHROSCOPY WITH OPEN ROTATOR CUFF REPAIR Right 05/2009   TONSILLECTOMY  1977   TUBAL LIGATION     VIDEO BRONCHOSCOPY WITH  ENDOBRONCHIAL NAVIGATION N/A 10/18/2020   Procedure: VIDEO BRONCHOSCOPY WITH ENDOBRONCHIAL NAVIGATION;  Surgeon: Collene Gobble, MD;  Location: [place];  Service: Thoracic;  Laterality: N/A;   VIDEO BRONCHOSCOPY WITH ENDOBRONCHIAL ULTRASOUND N/A 10/18/2020   Procedure: VIDEO BRONCHOSCOPY WITH ENDOBRONCHIAL ULTRASOUND;  Surgeon: Collene Gobble, MD;  Location: [place];  Service: Thoracic;  Laterality: N/A;   WISDOM TOOTH EXTRACTION     Past Surgical History:  Procedure Laterality Date   CESAREAN SECTION     X 3   COLONOSCOPY  2013, 2016, 2019   hx colon polyps   DILATION AND CURETTAGE OF UTERUS     FUDUCIAL PLACEMENT Right 10/18/2020   Procedure: PLACEMENT OF FUDUCIAL;  Surgeon: Collene Gobble, MD;  [facility];  Service: Thoracic;  Laterality: Right;   IR IMAGING GUIDED PORT INSERTION  11/29/2020   SHOULDER ARTHROSCOPY WITH OPEN ROTATOR CUFF REPAIR Right 05/2009   TONSILLECTOMY  1977   TUBAL LIGATION     VIDEO BRONCHOSCOPY WITH ENDOBRONCHIAL NAVIGATION N/A 10/18/2020   Procedure: VIDEO BRONCHOSCOPY WITH ENDOBRONCHIAL NAVIGATION;  Surgeon: Collene Gobble, MD;  Location: [place];  Service: Thoracic;  Laterality: N/A;   VIDEO BRONCHOSCOPY WITH ENDOBRONCHIAL ULTRASOUND N/A 10/18/2020   Procedure: VIDEO BRONCHOSCOPY WITH ENDOBRONCHIAL ULTRASOUND;  Surgeon: Collene Gobble, MD;  Location: [place];  Service: Thoracic;  Laterality: N/A;   WISDOM TOOTH EXTRACTION     Past Medical History:  Diagnosis Date   Allergy    Arthritis    Basal cell carcinoma 2018   Removed from Right neck per patient    Diverticulosis 2013   HLD (hyperlipidemia)    Hx of adenomatous colonic polyps 2013   OAB (overactive bladder)    Pre-diabetes    Small cell lung cancer ([place]) 11/09/2020   BP 127/84   Pulse (!) 114   Ht 5\' 8"  (1.727 m)   Wt 178 lb (80.7 kg)   LMP  (LMP Unknown)   SpO2 98%   BMI 27.06 kg/m   Opioid Risk Score:   Fall Risk Score:  `1  Depression screen [facility] 2/9     01/24/2022   10:47 AM  10/25/2021   10:27 AM 07/12/2021   10:17 AM 06/14/2021    8:24 AM 04/18/2021   11:05 AM 08/23/2020    5:11 PM 03/10/2020   11:13 AM  Depression screen PHQ 2/9  Decreased Interest 0 0 0 0 0 0 0  Down, Depressed, Hopeless 0 0 0 0 0 0 0  PHQ - 2 Score 0 0 0 0 0 0 0    Review of Systems  Constitutional: Negative.   HENT: Negative.    Eyes: Negative.  Respiratory: Negative.    Cardiovascular: Negative.   Gastrointestinal: Negative.   Endocrine: Negative.   Genitourinary: Negative.   Musculoskeletal:  Positive for myalgias and neck pain.  Skin: Negative.   Allergic/Immunologic: Negative.   Neurological: Negative.   Hematological: Negative.   Psychiatric/Behavioral: Negative.    All other systems reviewed and are negative.     Objective:   Physical Exam Vitals and nursing note reviewed.  Constitutional:      Appearance: Normal appearance.  Neck:     Comments: Cervical Paraspinal Tenderness: C-5-C-6 Cardiovascular:     Rate and Rhythm: Normal rate and regular rhythm.     Pulses: Normal pulses.     Heart sounds: Normal heart sounds.  Pulmonary:     Effort: Pulmonary effort is normal.     Breath sounds: Normal breath sounds.  Musculoskeletal:     Cervical back: Normal range of motion and neck supple.     Comments: Normal Muscle Bulk and Muscle Testing Reveals:  Upper Extremities: Right: Upper Extremity: Decreased ROM 45 Degrees and Muscle Strength 5/5 Right AC Joint Tenderness Left Upper Extremity: Full ROM and Muscle Strength 5/5 Thoracic Hypersensitivity: T-1- T-9 Lumbar Paraspinal Tenderness: L-4-L-5 Lower Extremities: Full ROM and Muscle Strength 5/5  Arises from Chair with ease Narrow Based Gait     Skin:    General: Skin is warm and dry.  Neurological:     Mental Status: She is alert and oriented to person, place, and time.  Psychiatric:        Mood and Affect: Mood normal.        Behavior: Behavior normal.         Assessment & Plan:  1.Cervical spinal  stenosis without evidence of radiculopathy. Chronic neck pain: Refilled Tylenol #4  300/60mg  daily, #30. 01/24/2022. We will continue the opioid monitoring program, this consists of regular clinic visits, examinations, urine drug screen, pill counts as well as use of [place] Controlled Substance Reporting system. A 12 month History has been reviewed on the [place] on 01/24/2022. 2. Right shoulder rotator cuff tear, status post repair with chronic pain and contracture : Continue with exercise regime as tolerated .01/24/2022. 3. Insomnia: Continue Melatonin. 01/24/2022 4. Chronic Bilateral Lower Back Pain without Sciatica: No complaints today. Continue HEP as tolerated. Continue to Monitor. 01/24/2021  5. Right Greater Trochanter Bursitis: No complaints Today. Continue to Alternate Ice and Heat Therapy. Continue to Monitor 01/24/2022 6. Chronic right Sided Thoracic Back Pain: Continue current medication regime. Continue HEP as tolerated. Continue to monitor. 01/24/2022   F/U in 3 months

## 2022-01-25 ENCOUNTER — Ambulatory Visit
Admission: RE | Admit: 2022-01-25 | Discharge: 2022-01-25 | Disposition: A | Discharge status: Home / Self Care | Payer: Medicare Other | Source: Ambulatory Visit | Attending: Radiation Oncology | Admitting: Radiation Oncology

## 2022-01-25 DIAGNOSIS — C7951 Secondary malignant neoplasm of bone: Secondary | ICD-10-CM | POA: Diagnosis not present

## 2022-01-25 DIAGNOSIS — C7931 Secondary malignant neoplasm of brain: Secondary | ICD-10-CM | POA: Diagnosis not present

## 2022-01-25 DIAGNOSIS — I639 Cerebral infarction, unspecified: Secondary | ICD-10-CM | POA: Diagnosis not present

## 2022-01-25 MED ORDER — GADOBENATE DIMEGLUMINE 529 MG/ML IV SOLN
15.0000 mL | Freq: Once | INTRAVENOUS | Status: AC | PRN
  Administered 2022-01-25: 15 mL via INTRAVENOUS

## 2022-01-25 MED ORDER — SODIUM CHLORIDE 0.9% FLUSH
10.0000 mL | INTRAVENOUS | Status: DC | PRN
  Administered 2022-01-25: 10 mL via INTRAVENOUS

## 2022-01-25 MED ORDER — HEPARIN SOD (PORK) LOCK FLUSH 100 UNIT/ML IV SOLN
500.0000 [IU] | Freq: Once | INTRAVENOUS | Status: AC
  Administered 2022-01-25: 500 [IU] via INTRAVENOUS

## 2022-01-27 ENCOUNTER — Other Ambulatory Visit: Payer: Self-pay | Admitting: Registered Nurse

## 2022-01-29 ENCOUNTER — Inpatient Hospital Stay: Payer: Medicare Other | Attending: Physician Assistant

## 2022-01-29 ENCOUNTER — Ambulatory Visit
Admission: RE | Admit: 2022-01-29 | Discharge: 2022-01-29 | Disposition: A | Discharge status: Home / Self Care | Payer: Medicare Other | Source: Ambulatory Visit | Attending: Radiation Oncology | Admitting: Radiation Oncology

## 2022-01-29 ENCOUNTER — Encounter: Payer: Self-pay | Admitting: Radiation Oncology

## 2022-01-29 DIAGNOSIS — C3411 Malignant neoplasm of upper lobe, right bronchus or lung: Secondary | ICD-10-CM | POA: Diagnosis not present

## 2022-01-29 DIAGNOSIS — F17210 Nicotine dependence, cigarettes, uncomplicated: Secondary | ICD-10-CM | POA: Diagnosis not present

## 2022-01-29 DIAGNOSIS — C3491 Malignant neoplasm of unspecified part of right bronchus or lung: Secondary | ICD-10-CM

## 2022-01-29 NOTE — Progress Notes (Signed)
Radiation Oncology         [phone] ________________________________  Initial Outpatient Consultation - Conducted via telephone   I spoke with the patient to conduct this consult visit via telephone to spare the patient unnecessary potential exposure in the healthcare setting. The patient was notified in advance and was offered a [place] meeting to allow for face to face communication but unfortunately reported that they did not have the appropriate resources/technology to support such a visit and instead preferred to proceed with a telephone visit.  ________________________________  Name: Susan Cortez        MRN: [id number]  Date of Service: 01/29/2022 DOB: 03-27-1961  MO:QHUTMLY, Gay Filler, MD  Copland, Gay Filler, MD     REFERRING PHYSICIAN: Copland, Gay Filler, MD   DIAGNOSIS: The encounter diagnosis was Small cell carcinoma of right lung ([place]).   HISTORY OF PRESENT ILLNESS: Susan Cortez is a 61 y.o. female with a history of limited stage small cell carcinoma of the right upper lobe involving suprahilar nodule/adenopathy and right paractracheal adenopathy. A biopsy and cytology on 10/18/20 showed malignant cells in the right upper lobe brushings and fine-needle aspirate, atypical cells were seen in the 11 R node by fine-needle aspiration and malignant cells consistent with small cell carcinoma were seen in the level 4R and 10 R nodes as well.  She received chemoradiation which she completed on  01/04/21.   She was counseled on prophylactic cranial irradiation and prior to treatment had a negative MRI of the brain. She did have a lesion in the calvarium that was originally read as possibly metastatic, but after further discussion with neuroradiology, this was felt to be a benign appearing hemangioma. She went on to proceed with whole brain radiation with Namenda. She completed radiation to the brain on 03/28/21. She completed Namenda in January 2023. She continues in surveillance with Dr.  Julien Nordmann.  Her most recent MRI of the brain on 01/25/2022 showed stable findings in the calvarium, resolution of the mastoid effusion on the right side and no evidence of intracranial metastatic disease.  She is contacted today to discuss these results.    REVIEW OF SYSTEMS: On review of systems the patient reports she is doing well overall.  She states that she continues to have peripheral neuropathy however despite gabapentin and vitamin B6.  No other complaints of headaches, blurred vision, double vision, speech or movement changes are noted.  She states that she is not having any trouble with breathing or chest pain.  No other complaints are verbalized.    PREVIOUS RADIATION THERAPY:   03/15/2021 through 03/28/2021 Site Technique Total Dose (Gy) Dose per Fx (Gy) Completed Fx Beam Energies  Brain: Brain Whole Brain Complex 25/25 2.5 10/10 6X   11/21/2020 through 01/04/2021 Site Technique Total Dose (Gy) Dose per Fx (Gy) Completed Fx Beam Energies  Lung, Right: Lung_Rt 3D 60/60 2 30/30 6X, 10X  Lung, Right: Lung_Rt_Bst 3D 6/6 2 3/3 6X, 10X    PAST MEDICAL HISTORY:  Past Medical History:  Diagnosis Date   Allergy    Arthritis    Basal cell carcinoma 2018   Removed from Right neck per patient    Diverticulosis 2013   HLD (hyperlipidemia)    Hx of adenomatous colonic polyps 2013   OAB (overactive bladder)    Pre-diabetes    Small cell lung cancer ([place]) 11/09/2020       PAST SURGICAL HISTORY: Past Surgical History:  Procedure Laterality Date   CESAREAN SECTION  X 3   COLONOSCOPY  2013, 2016, 2019   hx colon polyps   DILATION AND CURETTAGE OF UTERUS     FUDUCIAL PLACEMENT Right 10/18/2020   Procedure: PLACEMENT OF FUDUCIAL;  Surgeon: Collene Gobble, MD;  Location: [place];  Service: Thoracic;  Laterality: Right;   IR IMAGING GUIDED PORT INSERTION  11/29/2020   SHOULDER ARTHROSCOPY WITH OPEN ROTATOR CUFF REPAIR Right 05/2009   TONSILLECTOMY  1977   TUBAL LIGATION     VIDEO  BRONCHOSCOPY WITH ENDOBRONCHIAL NAVIGATION N/A 10/18/2020   Procedure: VIDEO BRONCHOSCOPY WITH ENDOBRONCHIAL NAVIGATION;  Surgeon: Collene Gobble, MD;  [facility];  Service: Thoracic;  Laterality: N/A;   VIDEO BRONCHOSCOPY WITH ENDOBRONCHIAL ULTRASOUND N/A 10/18/2020   Procedure: VIDEO BRONCHOSCOPY WITH ENDOBRONCHIAL ULTRASOUND;  Surgeon: Collene Gobble, MD;  [facility];  Service: Thoracic;  Laterality: N/A;   WISDOM TOOTH EXTRACTION       FAMILY HISTORY:  Family History  Problem Relation Age of Onset   Diabetes Father    Heart disease Father    Hyperlipidemia Father    Colon cancer Neg Hx    Colon polyps Neg Hx    Esophageal cancer Neg Hx    Rectal cancer Neg Hx    Stomach cancer Neg Hx      SOCIAL HISTORY:  reports that she has been smoking cigarettes. She has a 45.00 pack-year smoking history. She has never used smokeless tobacco. She reports that she does not drink alcohol and does not use drugs. The patient is divorced and lives in [place]. She is originally from [place] and enjoys traveling home to [place] periodically to see friends and family.     ALLERGIES: Lyrica [pregabalin], Butrans [buprenorphine], Chantix [varenicline], Diclofenac, Doxycycline, Meloxicam, Tolmetin, and Nsaids   MEDICATIONS:  Current Outpatient Medications  Medication Sig Dispense Refill   acetaminophen-codeine (TYLENOL #4) 300-60 MG tablet Take 1 tablet by mouth daily as needed for moderate pain. 30 tablet 2   CALCIUM PO Take 650 mg by mouth daily.     Cholecalciferol (VITAMIN D3) 125 MCG (5000 UT) CAPS Take 5,000 Units by mouth daily.     Cyanocobalamin (B-12) 100 MCG TABS      gabapentin (NEURONTIN) 100 MG capsule Take 1 capsule (100 mg total) by mouth 2 (two) times daily. 60 capsule 1   Investigational palmitoylethanolamide/placebo 400 MG capsule ACCRU-[id number]-2102 Take 400 mg by mouth daily. Take with food and swallow whole. Do not crush or open capsule. Store at room temperature. Keep  container tightly closed.     lidocaine-prilocaine (EMLA) cream APPLY 1 APPLICATION TOPICALLY AS NEEDED. 30 g 0   LORazepam (ATIVAN) 1 MG tablet TAKE 0.5 TABLET BY MOUTH EVERY 8 HOURS. TAKE 1 TABLET 30 MINUTES PRIOR TO MRI OR RADIATION 14 tablet 0   lovastatin (MEVACOR) 20 MG tablet TAKE 1 TABLET BY MOUTH AT BEDTIME 90 tablet 1   metoprolol succinate (TOPROL-XL) 25 MG 24 hr tablet Take 0.5 tablets (12.5 mg total) by mouth daily. 45 tablet 3   Multiple Vitamins-Minerals (MULTIVITAMIN ADULT, MINERALS,) TABS      prochlorperazine (COMPAZINE) 10 MG tablet Take 1 tablet (10 mg total) by mouth every 6 (six) hours as needed. 30 tablet 2   No current facility-administered medications for this encounter.        PHYSICAL EXAM:  Unable to assess  due to encounter type.    ECOG = 1  0 - Asymptomatic (Fully active, able to carry on all predisease  activities without restriction)  1 - Symptomatic but completely ambulatory (Restricted in physically strenuous activity but ambulatory and able to carry out work of a light or sedentary nature. For example, light housework, office work)  2 - Symptomatic, <50% in bed during the day (Ambulatory and capable of all self care but unable to carry out any work activities. Up and about more than 50% of waking hours)  3 - Symptomatic, >50% in bed, but not bedbound (Capable of only limited self-care, confined to bed or chair 50% or more of waking hours)  4 - Bedbound (Completely disabled. Cannot carry on any self-care. Totally confined to bed or chair)  5 - Death   Eustace Pen MM, Creech RH, Tormey DC, et al. [phone]). "Toxicity and response criteria of the [facility]". [place] Oncol. 5 (6): 649-55    LABORATORY DATA:  Lab Results  Component Value Date   WBC 7.2 12/28/2021   HGB 12.8 12/28/2021   HCT 38.4 12/28/2021   MCV 94.3 12/28/2021   PLT 330 12/28/2021   Lab Results  Component Value Date   NA 137 12/28/2021   K 3.9  12/28/2021   CL 104 12/28/2021   CO2 27 12/28/2021   Lab Results  Component Value Date   ALT 19 12/28/2021   AST 19 12/28/2021   ALKPHOS 55 12/28/2021   BILITOT 0.4 12/28/2021      RADIOGRAPHY: MR Brain W Wo Contrast  Result Date: 01/26/2022 CLINICAL DATA:  Brain metastases, assess treatment response 3T SRS Protocol EXAM: MRI HEAD WITHOUT AND WITH CONTRAST TECHNIQUE: Multiplanar, multiecho pulse sequences of the brain and surrounding structures were obtained without and with intravenous contrast. CONTRAST:  79mL MULTIHANCE GADOBENATE DIMEGLUMINE 529 MG/ML IV SOLN COMPARISON:  10/26/2021 FINDINGS: Brain: There is no acute infarction or intracranial hemorrhage. There is no intracranial mass, mass effect, or edema. There is no hydrocephalus or extra-axial fluid collection. Ventricles and sulci are normal in size and configuration. 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 No abnormal enhancement. Vascular: Major vessel flow voids at the skull base are preserved. Skull and upper cervical spine: Stable enhancing lesion of the left parietal calvarium. Normal marrow signal is otherwise preserved. Sinuses/Orbits: Paranasal sinuses are aerated. Orbits are unremarkable. Other: Sella is unremarkable.  Mastoid air cells are clear. IMPRESSION: No evidence of intracranial metastatic disease. Left parietal calvarial lesion remains stable. Electronically Signed   By: Macy Mis M.D.   On: 01/26/2022 08:25        IMPRESSION/PLAN: 1. Limited Stage Small Cell Carcinoma of the RUL.  The patient is doing very well clinically, radiographically her imaging is stable and favorable.  As she is also doing well systemically we discussed moving the interval of restaging MRI.  She is interested in delaying to 6 months, I think this is reasonable as long as her systemic disease is in remission. She will also follow up with Dr. Worthy Flank team in surveillance.  2. Left Parietal Calvarial Hemangioma.  Again this has  remained stable on MRI and in discussion of brain oncology conference has been felt to be hemangioma rather than any concerns for malignancy. 3. Peripheral neuropathy.  This continues to be symptomatic for her however she has not found benefit from vitamin B6 and will discontinue using this, she has been taking Neurontin and is hopeful that this will be helpful for her symptoms as well.  Given current concerns for patient exposure during the COVID-19 pandemic, this encounter was conducted via telephone.  The patient has provided two factor identification and has given verbal consent for this type of encounter and has been advised to only accept a meeting of this type in a secure network environment. The time spent during this encounter was 30 minutes including preparation, discussion, and coordination of the patient's care. The attendants for this meeting included Hayden Pedro  and [name].  During the encounter,  Hayden Pedro was located at [facility] Radiation Oncology Department.  Susan Cortez was located at home.       Carola Rhine, [facility]   **Disclaimer: This note was dictated with voice recognition software. Similar sounding words can inadvertently be transcribed and this note may contain transcription errors which may not have been corrected upon publication of note.**

## 2022-01-29 NOTE — Progress Notes (Signed)
Telephone appointment. I verified patient's identity and began  nursing interview. Patient reports doing well. No issues reported at this time.  Meaningful use complete. Postmenopausal- NO chances of pregnancy.  Patient's 1:00pm-01/29/22 telephone appointment w/ Shona Simpson PA-C, has been completed. I left my extension [phone] in case patient needs anything.  Patient contact [phone]

## 2022-02-14 ENCOUNTER — Other Ambulatory Visit (HOSPITAL_BASED_OUTPATIENT_CLINIC_OR_DEPARTMENT_OTHER): Payer: Self-pay | Admitting: Family Medicine

## 2022-02-14 DIAGNOSIS — Z1231 Encounter for screening mammogram for malignant neoplasm of breast: Secondary | ICD-10-CM

## 2022-02-16 ENCOUNTER — Other Ambulatory Visit: Payer: Self-pay | Admitting: Physician Assistant

## 2022-02-25 ENCOUNTER — Other Ambulatory Visit: Payer: Self-pay | Admitting: Family Medicine

## 2022-02-25 DIAGNOSIS — E785 Hyperlipidemia, unspecified: Secondary | ICD-10-CM

## 2022-02-26 ENCOUNTER — Other Ambulatory Visit: Payer: Self-pay | Admitting: Internal Medicine

## 2022-03-13 DIAGNOSIS — H524 Presbyopia: Secondary | ICD-10-CM | POA: Diagnosis not present

## 2022-03-13 DIAGNOSIS — H2513 Age-related nuclear cataract, bilateral: Secondary | ICD-10-CM | POA: Diagnosis not present

## 2022-03-16 ENCOUNTER — Encounter: Payer: Self-pay | Admitting: Podiatry

## 2022-03-16 ENCOUNTER — Ambulatory Visit (INDEPENDENT_AMBULATORY_CARE_PROVIDER_SITE_OTHER): Payer: Medicare Other | Admitting: Podiatry

## 2022-03-16 DIAGNOSIS — M216X2 Other acquired deformities of left foot: Secondary | ICD-10-CM | POA: Diagnosis not present

## 2022-03-16 DIAGNOSIS — Q828 Other specified congenital malformations of skin: Secondary | ICD-10-CM | POA: Diagnosis not present

## 2022-03-16 DIAGNOSIS — M216X1 Other acquired deformities of right foot: Secondary | ICD-10-CM

## 2022-03-16 NOTE — Progress Notes (Signed)
This patient present to the office  with chief complaint of callus developing under the outside of ball of both feet.  She says this callus has become painful walking and wearing her shoes. Patient has provided no  treatment or sought professional help.  She presents to the office for treatment of her painful callus.  Vascular  Dorsalis pedis and posterior tibial pulses are palpable  B/L.  Capillary return  WNL.  Temperature gradient is  WNL.  Skin turgor  WNL  Sensorium  Senn Weinstein monofilament wire  WNL. Normal tactile sensation.  Nail Exam  Patient has normal nails with no evidence of bacterial or fungal infection.  Orthopedic  Exam  Muscle tone and muscle strength  WNL.  No limitations of motion feet  B/L.  No crepitus or joint effusion noted.  Foot type is unremarkable and digits show no abnormalities.  Bony prominences are unremarkable.  Plantar flexed fifth metatarsal  B/L.  Skin  No open lesions.  Normal skin texture and turgor.  Callus/porokeratosis  sub 5th  B/L  Porokeratosis secondary plantar flexed fifth metatarsal  B/L  Debride callus/porokeratosis.  Discussed condition with patient.  Gardiner Barefoot DPM

## 2022-03-19 ENCOUNTER — Ambulatory Visit (HOSPITAL_BASED_OUTPATIENT_CLINIC_OR_DEPARTMENT_OTHER)
Admission: RE | Admit: 2022-03-19 | Discharge: 2022-03-19 | Disposition: A | Discharge status: Home / Self Care | Payer: Medicare Other | Source: Ambulatory Visit | Attending: Family Medicine | Admitting: Family Medicine

## 2022-03-19 ENCOUNTER — Encounter (HOSPITAL_BASED_OUTPATIENT_CLINIC_OR_DEPARTMENT_OTHER): Payer: Self-pay

## 2022-03-19 ENCOUNTER — Encounter: Payer: Self-pay | Admitting: Internal Medicine

## 2022-03-19 DIAGNOSIS — Z1231 Encounter for screening mammogram for malignant neoplasm of breast: Secondary | ICD-10-CM | POA: Diagnosis not present

## 2022-03-21 ENCOUNTER — Telehealth: Payer: Self-pay | Admitting: Internal Medicine

## 2022-03-21 NOTE — Telephone Encounter (Signed)
.  Called patient to schedule appointment per 7/25 inbasket, patient is aware of date and time.

## 2022-03-26 ENCOUNTER — Other Ambulatory Visit: Payer: Self-pay

## 2022-03-26 ENCOUNTER — Inpatient Hospital Stay: Payer: Medicare Other | Attending: Physician Assistant

## 2022-03-26 DIAGNOSIS — C3411 Malignant neoplasm of upper lobe, right bronchus or lung: Secondary | ICD-10-CM | POA: Insufficient documentation

## 2022-03-26 DIAGNOSIS — C3491 Malignant neoplasm of unspecified part of right bronchus or lung: Secondary | ICD-10-CM

## 2022-03-26 DIAGNOSIS — Z95828 Presence of other vascular implants and grafts: Secondary | ICD-10-CM

## 2022-03-26 DIAGNOSIS — Z452 Encounter for adjustment and management of vascular access device: Secondary | ICD-10-CM | POA: Insufficient documentation

## 2022-03-26 MED ORDER — HEPARIN SOD (PORK) LOCK FLUSH 100 UNIT/ML IV SOLN
500.0000 [IU] | Freq: Once | INTRAVENOUS | Status: AC
  Administered 2022-03-26: 500 [IU]

## 2022-03-26 MED ORDER — SODIUM CHLORIDE 0.9% FLUSH
10.0000 mL | Freq: Once | INTRAVENOUS | Status: AC
  Administered 2022-03-26: 10 mL

## 2022-04-11 ENCOUNTER — Other Ambulatory Visit: Payer: Self-pay

## 2022-04-11 ENCOUNTER — Encounter: Payer: Self-pay | Admitting: Internal Medicine

## 2022-04-11 ENCOUNTER — Telehealth: Payer: Self-pay

## 2022-04-11 DIAGNOSIS — Z95828 Presence of other vascular implants and grafts: Secondary | ICD-10-CM

## 2022-04-11 DIAGNOSIS — C3491 Malignant neoplasm of unspecified part of right bronchus or lung: Secondary | ICD-10-CM

## 2022-04-11 NOTE — Telephone Encounter (Signed)
   I spoke with the pt regarding her MyChart message below. She would like to have the port removed after 05/02/22 because she is traveling out of town until then. I advised pt she will see the appt appear in her MyChart. Pt was agreeable to this.  Order has been placed. I have spoken with IR and she is being scheduled for 05/08/22 at 3pm.  MyChart message: I was wondering if I can get my power port removed since my chest CT and brain MRI are now 6 months apart? I recently came across an article (see below) about a lawsuit.   [employer] Victims are alleging that the [employer] is defectively designed. Victims are currently filing [place] lawsuits, alleging that [employer] had knowledge that the catheters were likely to put victims at severe risk. Damages could be won as a result of a [employer] catheter lawsuit for victims who were implanted with a Powerport and endured one of the below complications:   Infections Septic shock Hemmoraging injuries Sepsis Deep Vein Thrombosis (DVT) Bleeding Injuries Cardiac/pericardial tamponade Cardiac arrhythmia Severe and persistent pain Perforations of  vessels, tissues as well as organs Death Assorted other injuries linked to the fractured Powerport catheter   Thank you. Alita Chyle

## 2022-04-18 ENCOUNTER — Ambulatory Visit: Payer: Medicare Other

## 2022-04-19 ENCOUNTER — Ambulatory Visit (INDEPENDENT_AMBULATORY_CARE_PROVIDER_SITE_OTHER): Payer: Medicare Other

## 2022-04-19 DIAGNOSIS — Z0000 Encounter for general adult medical examination without abnormal findings: Secondary | ICD-10-CM | POA: Diagnosis not present

## 2022-04-19 NOTE — Patient Instructions (Signed)
[name] , Thank you for taking time to come for your Medicare Wellness Visit. I appreciate your ongoing commitment to your health goals. Please review the following plan we discussed and let me know if I can assist you in the future.   Screening recommendations/referrals: Colonoscopy: 10/24/20 due 10/25/23 Mammogram: 03/19/22 due 03/20/23 Bone Density: n/a Recommended yearly ophthalmology/optometry visit for glaucoma screening and checkup Recommended yearly dental visit for hygiene and checkup  Vaccinations: Influenza vaccine: declined Pneumococcal vaccine: n/a Tdap vaccine: Due-May obtain vaccine at your local pharmacy.  Shingles vaccine: up to date  Covid-19: declined  Advanced directives: yes, on file  Conditions/risks identified: see problem list   Next appointment: Follow up in one year for your annual wellness visit. 04/23/23  Preventive Care 40-64 Years, Female Preventive care refers to lifestyle choices and visits with your health care provider that can promote health and wellness. What does preventive care include? A yearly physical exam. This is also called an annual well check. Dental exams once or twice a year. Routine eye exams. Ask your health care provider how often you should have your eyes checked. Personal lifestyle choices, including: Daily care of your teeth and gums. Regular physical activity. Eating a healthy diet. Avoiding tobacco and drug use. Limiting alcohol use. Practicing safe sex. Taking low-dose aspirin daily starting at age 45. Taking vitamin and mineral supplements as recommended by your health care provider. What happens during an annual well check? The services and screenings done by your health care provider during your annual well check will depend on your age, overall health, lifestyle risk factors, and family history of disease. Counseling  Your health care provider may ask you questions about your: Alcohol use. Tobacco use. Drug  use. Emotional well-being. Home and relationship well-being. Sexual activity. Eating habits. Work and work Statistician. Method of birth control. Menstrual cycle. Pregnancy history. Screening  You may have the following tests or measurements: Height, weight, and BMI. Blood pressure. Lipid and cholesterol levels. These may be checked every 5 years, or more frequently if you are over 80 years old. Skin check. Lung cancer screening. You may have this screening every year starting at age 32 if you have a 30-pack-year history of smoking and currently smoke or have quit within the past 15 years. Fecal occult blood test (FOBT) of the stool. You may have this test every year starting at age 99. Flexible sigmoidoscopy or colonoscopy. You may have a sigmoidoscopy every 5 years or a colonoscopy every 10 years starting at age 67. Hepatitis C blood test. Hepatitis B blood test. Sexually transmitted disease (STD) testing. Diabetes screening. This is done by checking your blood sugar (glucose) after you have not eaten for a while (fasting). You may have this done every 1-3 years. Mammogram. This may be done every 1-2 years. Talk to your health care provider about when you should start having regular mammograms. This may depend on whether you have a family history of breast cancer. BRCA-related cancer screening. This may be done if you have a family history of breast, ovarian, tubal, or peritoneal cancers. Pelvic exam and Pap test. This may be done every 3 years starting at age 58. Starting at age 55, this may be done every 5 years if you have a Pap test in combination with an HPV test. Bone density scan. This is done to screen for osteoporosis. You may have this scan if you are at high risk for osteoporosis. Discuss your test results, treatment options, and if necessary,  the need for more tests with your health care provider. Vaccines  Your health care provider may recommend certain vaccines, such  as: Influenza vaccine. This is recommended every year. Tetanus, diphtheria, and acellular pertussis (Tdap, Td) vaccine. You may need a Td booster every 10 years. Zoster vaccine. You may need this after age 50. Pneumococcal 13-valent conjugate (PCV13) vaccine. You may need this if you have certain conditions and were not previously vaccinated. Pneumococcal polysaccharide (PPSV23) vaccine. You may need one or two doses if you smoke cigarettes or if you have certain conditions. Talk to your health care provider about which screenings and vaccines you need and how often you need them. This information is not intended to replace advice given to you by your health care provider. Make sure you discuss any questions you have with your health care provider. Document Released: 09/09/2015 Document Revised: 05/02/2016 Document Reviewed: 06/14/2015 Elsevier Interactive Patient Education  2017 [place] Prevention in the Home Falls can cause injuries. They can happen to people of all ages. There are many things you can do to make your home safe and to help prevent falls. What can I do on the outside of my home? Regularly fix the edges of walkways and driveways and fix any cracks. Remove anything that might make you trip as you walk through a door, such as a raised step or threshold. Trim any bushes or trees on the path to your home. Use bright outdoor lighting. Clear any walking paths of anything that might make someone trip, such as rocks or tools. Regularly check to see if handrails are loose or broken. Make sure that both sides of any steps have handrails. Any raised decks and porches should have guardrails on the edges. Have any leaves, snow, or ice cleared regularly. Use sand or salt on walking paths during winter. Clean up any spills in your garage right away. This includes oil or grease spills. What can I do in the bathroom? Use night lights. Install grab bars by the toilet and in  the tub and shower. Do not use towel bars as grab bars. Use non-skid mats or decals in the tub or shower. If you need to sit down in the shower, use a plastic, non-slip stool. Keep the floor dry. Clean up any water that spills on the floor as soon as it happens. Remove soap buildup in the tub or shower regularly. Attach bath mats securely with double-sided non-slip rug tape. Do not have throw rugs and other things on the floor that can make you trip. What can I do in the bedroom? Use night lights. Make sure that you have a light by your bed that is easy to reach. Do not use any sheets or blankets that are too big for your bed. They should not hang down onto the floor. Have a firm chair that has side arms. You can use this for support while you get dressed. Do not have throw rugs and other things on the floor that can make you trip. What can I do in the kitchen? Clean up any spills right away. Avoid walking on wet floors. Keep items that you use a lot in easy-to-reach places. If you need to reach something above you, use a strong step stool that has a grab bar. Keep electrical cords out of the way. Do not use floor polish or wax that makes floors slippery. If you must use wax, use non-skid floor wax. Do not have throw rugs  and other things on the floor that can make you trip. What can I do with my stairs? Do not leave any items on the stairs. Make sure that there are handrails on both sides of the stairs and use them. Fix handrails that are broken or loose. Make sure that handrails are as long as the stairways. Check any carpeting to make sure that it is firmly attached to the stairs. Fix any carpet that is loose or worn. Avoid having throw rugs at the top or bottom of the stairs. If you do have throw rugs, attach them to the floor with carpet tape. Make sure that you have a light switch at the top of the stairs and the bottom of the stairs. If you do not have them, ask someone to add them  for you. What else can I do to help prevent falls? Wear shoes that: Do not have high heels. Have rubber bottoms. Are comfortable and fit you well. Are closed at the toe. Do not wear sandals. If you use a stepladder: Make sure that it is fully opened. Do not climb a closed stepladder. Make sure that both sides of the stepladder are locked into place. Ask someone to hold it for you, if possible. Clearly mark and make sure that you can see: Any grab bars or handrails. First and last steps. Where the edge of each step is. Use tools that help you move around (mobility aids) if they are needed. These include: Canes. Walkers. Scooters. Crutches. Turn on the lights when you go into a dark area. Replace any light bulbs as soon as they burn out. Set up your furniture so you have a clear path. Avoid moving your furniture around. If any of your floors are uneven, fix them. If there are any pets around you, be aware of where they are. Review your medicines with your doctor. Some medicines can make you feel dizzy. This can increase your chance of falling. Ask your doctor what other things that you can do to help prevent falls. This information is not intended to replace advice given to you by your health care provider. Make sure you discuss any questions you have with your health care provider. Document Released: 06/09/2009 Document Revised: 01/19/2016 Document Reviewed: 09/17/2014 Elsevier Interactive Patient Education  2017 [employer].

## 2022-04-19 NOTE — Progress Notes (Addendum)
Subjective:   Susan Cortez is a 61 y.o. female who presents for Medicare Annual (Subsequent) preventive examination.  I connected with  Susan Cortez on 04/19/22 by a audio enabled telemedicine application and verified that I am speaking with the correct person using two identifiers.  Patient Location: Home  Provider Location: Office/Clinic  I discussed the limitations of evaluation and management by telemedicine. The patient expressed understanding and agreed to proceed.   Review of Systems     Cardiac Risk Factors include: advanced age (>52men, >20 women);dyslipidemia     Objective:    Today's Vitals   04/19/22 1306  PainSc: 6    There is no height or weight on file to calculate BMI.     04/19/2022    1:03 PM 01/29/2022   10:06 AM 10/25/2021   10:27 AM 10/24/2021   10:00 AM 05/29/2021    3:06 PM 05/29/2021    9:28 AM 04/18/2021   11:03 AM  Advanced Directives  Does Patient Have a Medical Advance Directive? Yes Yes Yes Yes Yes Yes Yes  Type of Paramedic of [place];Living will [place];Living will Healthcare Power of Attorney Living will;Healthcare Power of [place];Living will [place];Living will [place];Living will  Does patient want to make changes to medical advance directive? No - Patient declined    No - Patient declined No - Patient declined   Copy of [place] in Chart? Yes - validated most recent copy scanned in chart (See row information)    Yes - validated most recent copy scanned in chart (See row information) Yes - validated most recent copy scanned in chart (See row information) Yes - validated most recent copy scanned in chart (See row information)    Current Medications (verified) Outpatient Encounter Medications as of 04/19/2022  Medication Sig   acetaminophen-codeine (TYLENOL #4) 300-60 MG tablet Take 1 tablet by mouth  daily as needed for moderate pain.   CALCIUM PO Take 650 mg by mouth daily.   Cholecalciferol (VITAMIN D3) 125 MCG (5000 UT) CAPS Take 5,000 Units by mouth daily.   Cyanocobalamin (B-12) 100 MCG TABS    gabapentin (NEURONTIN) 100 MG capsule TAKE 1 CAPSULE BY MOUTH TWICE A DAY   lidocaine-prilocaine (EMLA) cream APPLY 1 APPLICATION TOPICALLY AS NEEDED.   LORazepam (ATIVAN) 1 MG tablet TAKE 0.5 TABLET BY MOUTH EVERY 8 HOURS. TAKE 1 TABLET 30 MINUTES PRIOR TO MRI OR RADIATION   lovastatin (MEVACOR) 20 MG tablet TAKE 1 TABLET BY MOUTH EVERYDAY AT BEDTIME   metoprolol succinate (TOPROL-XL) 25 MG 24 hr tablet Take 0.5 tablets (12.5 mg total) by mouth daily.   Multiple Vitamins-Minerals (MULTIVITAMIN ADULT, MINERALS,) TABS    No facility-administered encounter medications on file as of 04/19/2022.    Allergies (verified) Lyrica [pregabalin], Butrans [buprenorphine], Chantix [varenicline], Diclofenac, Doxycycline, Meloxicam, Tolmetin, and Nsaids   History: Past Medical History:  Diagnosis Date   Allergy    Arthritis    Basal cell carcinoma 2018   Removed from Right neck per patient    Diverticulosis 2013   HLD (hyperlipidemia)    Hx of adenomatous colonic polyps 2013   OAB (overactive bladder)    Pre-diabetes    Small cell lung cancer ([place]) 11/09/2020   Past Surgical History:  Procedure Laterality Date   CESAREAN SECTION     X 3   COLONOSCOPY  2013, 2016, 2019   hx colon polyps  DILATION AND CURETTAGE OF UTERUS     FUDUCIAL PLACEMENT Right 10/18/2020   Procedure: PLACEMENT OF FUDUCIAL;  Surgeon: Collene Gobble, MD;  Location: [facility] OR;  Service: Thoracic;  Laterality: Right;   IR IMAGING GUIDED PORT INSERTION  11/29/2020   SHOULDER ARTHROSCOPY WITH OPEN ROTATOR CUFF REPAIR Right 05/2009   TONSILLECTOMY  1977   TUBAL LIGATION     VIDEO BRONCHOSCOPY WITH ENDOBRONCHIAL NAVIGATION N/A 10/18/2020   Procedure: VIDEO BRONCHOSCOPY WITH ENDOBRONCHIAL NAVIGATION;  Surgeon: Collene Gobble, MD;   Location: [facility] OR;  Service: Thoracic;  Laterality: N/A;   VIDEO BRONCHOSCOPY WITH ENDOBRONCHIAL ULTRASOUND N/A 10/18/2020   Procedure: VIDEO BRONCHOSCOPY WITH ENDOBRONCHIAL ULTRASOUND;  Surgeon: Collene Gobble, MD;  Location: [facility] OR;  Service: Thoracic;  Laterality: N/A;   WISDOM TOOTH EXTRACTION     Family History  Problem Relation Age of Onset   Diabetes Father    Heart disease Father    Hyperlipidemia Father    Colon cancer Neg Hx    Colon polyps Neg Hx    Esophageal cancer Neg Hx    Rectal cancer Neg Hx    Stomach cancer Neg Hx    Social History   Socioeconomic History   Marital status: Divorced    Spouse name: Not on file   Number of children: Not on file   Years of education: Not on file   Highest education level: Not on file  Occupational History   Not on file  Tobacco Use   Smoking status: Every Day    Packs/day: 1.00    Years: 45.00    Total pack years: 45.00    Types: Cigarettes   Smokeless tobacco: Never   Tobacco comments:    1 pack smoked per day ARJ 09/01/21  Vaping Use   Vaping Use: Former  Substance and Sexual Activity   Alcohol use: No   Drug use: No   Sexual activity: Not on file    Comment: Tubal Ligation  Other Topics Concern   Not on file  Social History Narrative   Not on file   Social Determinants of Health   Financial Resource Strain: Low Risk  (04/18/2021)   Overall Financial Resource Strain (CARDIA)    Difficulty of Paying Living Expenses: Not hard at all  Food Insecurity: No Food Insecurity (04/18/2021)   Hunger Vital Sign    Worried About Running Out of Food in the Last Year: Never true    [place] in the Last Year: Never true  Transportation Needs: No Transportation Needs (04/18/2021)   PRAPARE - Hydrologist (Medical): No    Lack of Transportation (Non-Medical): No  Physical Activity: Inactive (04/18/2021)   Exercise Vital Sign    Days of Exercise per Week: 0 days    Minutes of Exercise per  Session: 0 min  Stress: Stress Concern Present (04/18/2021)   [place]    Feeling of Stress : To some extent  Social Connections: Socially Isolated (04/18/2021)   Social Connection and Isolation Panel [NHANES]    Frequency of Communication with Friends and Family: More than three times a week    Frequency of Social Gatherings with Friends and Family: More than three times a week    Attends Religious Services: Never    Marine scientist or Organizations: No    Attends Archivist Meetings: Never    Marital Status: Divorced  Tobacco Counseling Ready to quit: Not Answered Counseling given: Not Answered Tobacco comments: 1 pack smoked per day ARJ 09/01/21   Clinical Intake:  Pre-visit preparation completed: Yes  Pain : 0-10 Pain Score: 6  Pain Type: Chronic pain Pain Location: Other (Comment) (neck, shoulder, knee) Pain Descriptors / Indicators: Aching, Dull, Sore Pain Onset: More than a month ago Pain Frequency: Intermittent     Nutritional Risks: None Diabetes: No  How often do you need to have someone help you when you read instructions, pamphlets, or other written materials from your doctor or pharmacy?: 1 - Never  Diabetic?no  Interpreter Needed?: No  Information entered by :: Airiel Oblinger   Activities of Daily Living    04/19/2022    1:09 PM  In your present state of health, do you have any difficulty performing the following activities:  Hearing? 0  Vision? 0  Difficulty concentrating or making decisions? 0  Walking or climbing stairs? 0  Dressing or bathing? 0  Doing errands, shopping? 0  Preparing Food and eating ? N  Using the Toilet? N  In the past six months, have you accidently leaked urine? Y  Do you have problems with loss of bowel control? N  Managing your Medications? N  Managing your Finances? N  Housekeeping or managing your Housekeeping? N    Patient Care  Team: Copland, Gay Filler, MD as PCP - General (Family Medicine) Letta Pate Luanna Salk, MD as Consulting Physician (Pain Medicine) Jola Schmidt, MD as Consulting Physician (Ophthalmology) Janina Mayo, MD as Referring Physician (Cardiology)  Indicate any recent Medical Services you may have received from other than [facility] providers in the past year (date may be approximate).     Assessment:   This is a routine wellness examination for Tihanna.  Hearing/Vision screen No results found.  Dietary issues and exercise activities discussed: Current Exercise Habits: The patient does not participate in regular exercise at present, Exercise limited by: None identified   Goals Addressed             This Visit's Progress    Patient Stated   On track    Drink more water & eat healthier     Quit smoking / using tobacco   Not on track      Depression Screen    04/19/2022    1:06 PM 01/24/2022   10:47 AM 10/25/2021   10:27 AM 07/12/2021   10:17 AM 06/14/2021    8:24 AM 04/18/2021   11:05 AM 08/23/2020    5:11 PM  PHQ 2/9 Scores  PHQ - 2 Score 0 0 0 0 0 0 0    Fall Risk    04/19/2022    1:03 PM 01/24/2022   10:47 AM 10/25/2021   10:27 AM 07/12/2021   10:16 AM 06/14/2021    8:24 AM  [place] in the past year? 0 0 0 0 0  Number falls in past yr: 0   0 0  Injury with Fall? 0    0  Risk for fall due to : No Fall Risks      Follow up Falls evaluation completed        [place]:  Any stairs in or around the home? No  If so, are there any without handrails?  N/a Home free of loose throw rugs in walkways, pet beds, electrical cords, etc? Yes  Adequate lighting in your home to reduce risk of falls?  Yes   ASSISTIVE DEVICES UTILIZED TO PREVENT FALLS:  Life alert? No  Use of a cane, walker or w/c? No  Grab bars in the bathroom? No  Shower chair or bench in shower? Yes  Elevated toilet seat or a handicapped toilet? No   TIMED UP AND  GO:  Was the test performed? No .    Cognitive Function:    03/07/2018    4:08 PM 09/05/2016    9:50 AM  MMSE - Mini Mental State Exam  Orientation to time 5 5  Orientation to Place 5 5  Registration 3 3  Attention/ Calculation 5 5  Recall 3 3  Language- name 2 objects 2 2  Language- repeat 1 1  Language- follow 3 step command 3 3  Language- read & follow direction 1 1  Write a sentence 1 1  Copy design 1 1  Total score 30 30        04/19/2022    1:12 PM  6CIT Screen  What Year? 0 points  What month? 0 points  What time? 0 points  Count back from 20 0 points  Months in reverse 0 points  Repeat phrase 0 points  Total Score 0 points    Immunizations Immunization History  Administered Date(s) Administered   Influenza,inj,Quad PF,6+ Mos 06/11/2018, 06/03/2019   PFIZER(Purple Top)SARS-COV-2 Vaccination 11/30/2019, 12/28/2019   Tdap 08/28/2011   Zoster Recombinat (Shingrix) 06/03/2019, 08/06/2019    TDAP status: Due, Education has been provided regarding the importance of this vaccine. Advised may receive this vaccine at local pharmacy or Health Dept. Aware to provide a copy of the vaccination record if obtained from local pharmacy or Health Dept. Verbalized acceptance and understanding.  Flu Vaccine status: Declined, Education has been provided regarding the importance of this vaccine but patient still declined. Advised may receive this vaccine at local pharmacy or Health Dept. Aware to provide a copy of the vaccination record if obtained from local pharmacy or Health Dept. Verbalized acceptance and understanding.  Covid-19 vaccine status: Declined, Education has been provided regarding the importance of this vaccine but patient still declined. Advised may receive this vaccine at local pharmacy or Health Dept.or vaccine clinic. Aware to provide a copy of the vaccination record if obtained from local pharmacy or Health Dept. Verbalized acceptance and  understanding.  Qualifies for Shingles Vaccine? Yes   Zostavax completed No   Shingrix Completed?: Yes  Screening Tests Health Maintenance  Topic Date Due   COVID-19 Vaccine (3 - Pfizer risk series) 01/25/2020   TETANUS/TDAP  08/27/2021   INFLUENZA VACCINE  03/27/2022   COLONOSCOPY (Pts 45-29yrs Insurance coverage will need to be confirmed)  10/25/2023   MAMMOGRAM  03/19/2024   PAP SMEAR-Modifier  06/14/2024   Hepatitis C Screening  Completed   HIV Screening  Completed   Zoster Vaccines- Shingrix  Completed   HPV VACCINES  Aged Out    Health Maintenance  Health Maintenance Due  Topic Date Due   COVID-19 Vaccine (3 - Pfizer risk series) 01/25/2020   TETANUS/TDAP  08/27/2021   INFLUENZA VACCINE  03/27/2022    Colorectal cancer screening: Type of screening: Colonoscopy. Completed 10/24/20. Repeat every 3 years  Mammogram status: Completed 03/19/22. Repeat every year every 2 years   Lung Cancer Screening: (Low Dose CT Chest recommended if Age 62-80 years, 30 pack-year currently smoking OR have quit w/in 15years.) does qualify.   Lung Cancer Screening Referral: last done 12/28/21  Additional Screening:  Hepatitis C Screening: does qualify;  Completed 12/26/15  Vision Screening: Recommended annual ophthalmology exams for early detection of glaucoma and other disorders of the eye. Is the patient up to date with their annual eye exam?  Yes  Who is the provider or what is the name of the office in which the patient attends annual eye exams? Dr. Valetta Close If pt is not established with a provider, would they like to be referred to a provider to establish care? No .   Dental Screening: Recommended annual dental exams for proper oral hygiene  Community Resource Referral / Chronic Care Management: CRR required this visit?  No   CCM required this visit?  No      Plan:     I have personally reviewed and noted the following in the patient's chart:   Medical and social history Use  of alcohol, tobacco or illicit drugs  Current medications and supplements including opioid prescriptions. Patient is currently taking opioid prescriptions. Information provided to patient regarding non-opioid alternatives. Patient advised to discuss non-opioid treatment plan with their provider. Functional ability and status Nutritional status Physical activity Advanced directives List of other physicians Hospitalizations, surgeries, and ER visits in previous 12 months Vitals Screenings to include cognitive, depression, and falls Referrals and appointments  In addition, I have reviewed and discussed with patient certain preventive protocols, quality metrics, and best practice recommendations. A written personalized care plan for preventive services as well as general preventive health recommendations were provided to patient.   Due to this being a telephonic visit, the after visit summary with patients personalized plan was offered to patient via mail or my-chart. Patient would like to access on my-chart.  Duard Brady Syan Cullimore, [place]   04/19/2022   Nurse Notes: none

## 2022-04-23 ENCOUNTER — Ambulatory Visit: Payer: Medicare Other

## 2022-04-24 ENCOUNTER — Ambulatory Visit: Payer: Medicare Other | Admitting: Registered Nurse

## 2022-04-26 ENCOUNTER — Other Ambulatory Visit: Payer: Self-pay | Admitting: Radiation Therapy

## 2022-04-26 DIAGNOSIS — C7931 Secondary malignant neoplasm of brain: Secondary | ICD-10-CM

## 2022-04-28 ENCOUNTER — Other Ambulatory Visit: Payer: Self-pay | Admitting: Physician Assistant

## 2022-04-30 ENCOUNTER — Encounter: Payer: Self-pay | Admitting: Internal Medicine

## 2022-05-01 ENCOUNTER — Encounter: Payer: Medicare Other | Attending: Registered Nurse | Admitting: Registered Nurse

## 2022-05-01 ENCOUNTER — Encounter: Payer: Self-pay | Admitting: Registered Nurse

## 2022-05-01 VITALS — BP 116/76 | HR 100 | Ht 68.0 in | Wt 179.0 lb

## 2022-05-01 DIAGNOSIS — G8929 Other chronic pain: Secondary | ICD-10-CM | POA: Diagnosis not present

## 2022-05-01 DIAGNOSIS — M4802 Spinal stenosis, cervical region: Secondary | ICD-10-CM | POA: Insufficient documentation

## 2022-05-01 DIAGNOSIS — Z79891 Long term (current) use of opiate analgesic: Secondary | ICD-10-CM | POA: Insufficient documentation

## 2022-05-01 DIAGNOSIS — Z5181 Encounter for therapeutic drug level monitoring: Secondary | ICD-10-CM | POA: Diagnosis not present

## 2022-05-01 DIAGNOSIS — M5412 Radiculopathy, cervical region: Secondary | ICD-10-CM | POA: Insufficient documentation

## 2022-05-01 DIAGNOSIS — G894 Chronic pain syndrome: Secondary | ICD-10-CM | POA: Insufficient documentation

## 2022-05-01 DIAGNOSIS — M25511 Pain in right shoulder: Secondary | ICD-10-CM | POA: Diagnosis not present

## 2022-05-01 DIAGNOSIS — M25561 Pain in right knee: Secondary | ICD-10-CM | POA: Diagnosis not present

## 2022-05-01 DIAGNOSIS — M542 Cervicalgia: Secondary | ICD-10-CM | POA: Diagnosis not present

## 2022-05-01 MED ORDER — ACETAMINOPHEN-CODEINE 300-60 MG PO TABS: 1.0000 | ORAL_TABLET | Freq: Every day | ORAL | 2 refills | Status: DC | PRN

## 2022-05-01 NOTE — Progress Notes (Signed)
Subjective:    Patient ID: Susan Cortez, female    DOB: [birth year], 61 y.o.   MRN: [id number]  HPI: Susan Cortez is a 61 y.o. female who returns for follow up appointment for chronic pain and medication refill. She states her pain is located in her neck radiating into her right shoulder and right knee pain. She reports two months ago while walking from the bathroom she sat on the bed and noticed her right patella had moved to the left side. She states she pushed it back in place, and since then the pain has increased in intensity. She hasn't seen any one for the above, X-ray ordered, she verbalizes understanding..She rates her pain 6. Her current exercise regime is walking and performing stretching exercises.  [name] Morphine equivalent is 7.20 MME.   UDS ordered today.     Pain Inventory Average Pain 7 Pain Right Now 6 My pain is sharp, burning, stabbing, and aching  In the last 24 hours, has pain interfered with the following? General activity 1 Relation with others 0 Enjoyment of life 1 What TIME of day is your pain at its worst? morning , daytime, and evening Sleep (in general) Poor  Pain is worse with: walking, standing, and some activites Pain improves with: rest, pacing activities, and medication Relief from Meds: 8  Family History  Problem Relation Age of Onset   Diabetes Father    Heart disease Father    Hyperlipidemia Father    Colon cancer Neg Hx    Colon polyps Neg Hx    Esophageal cancer Neg Hx    Rectal cancer Neg Hx    Stomach cancer Neg Hx    Social History   Socioeconomic History   Marital status: Divorced    Spouse name: Not on file   Number of children: Not on file   Years of education: Not on file   Highest education level: Not on file  Occupational History   Not on file  Tobacco Use   Smoking status: Every Day    Packs/day: 1.00    Years: 45.00    Total pack years: 45.00    Types: Cigarettes   Smokeless tobacco: Never   Tobacco  comments:    1 pack smoked per day ARJ 09/01/21  Vaping Use   Vaping Use: Former  Substance and Sexual Activity   Alcohol use: No   Drug use: No   Sexual activity: Not on file    Comment: Tubal Ligation  Other Topics Concern   Not on file  Social History Narrative   Not on file   Social Determinants of Health   Financial Resource Strain: Low Risk  (04/18/2021)   Overall Financial Resource Strain (CARDIA)    Difficulty of Paying Living Expenses: Not hard at all  Food Insecurity: No Food Insecurity (04/18/2021)   Hunger Vital Sign    Worried About Running Out of Food in the Last Year: Never true    Ran Out of Food in the Last Year: Never true  Transportation Needs: No Transportation Needs (04/18/2021)   PRAPARE - Hydrologist (Medical): No    Lack of Transportation (Non-Medical): No  Physical Activity: Inactive (04/18/2021)   Exercise Vital Sign    Days of Exercise per Week: 0 days    Minutes of Exercise per Session: 0 min  Stress: Stress Concern Present (04/18/2021)   [place]  Feeling of Stress : To some extent  Social Connections: Socially Isolated (04/18/2021)   Social Connection and Isolation Panel [NHANES]    Frequency of Communication with Friends and Family: More than three times a week    Frequency of Social Gatherings with Friends and Family: More than three times a week    Attends Religious Services: Never    Marine scientist or Organizations: No    Attends Archivist Meetings: Never    Marital Status: Divorced   Past Surgical History:  Procedure Laterality Date   CESAREAN SECTION     X 3   COLONOSCOPY  2013, 2016, 2019   hx colon polyps   DILATION AND CURETTAGE OF UTERUS     FUDUCIAL PLACEMENT Right 10/18/2020   Procedure: PLACEMENT OF FUDUCIAL;  Surgeon: Collene Gobble, MD;  Location: [place];  Service: Thoracic;  Laterality: Right;   IR IMAGING GUIDED  PORT INSERTION  11/29/2020   SHOULDER ARTHROSCOPY WITH OPEN ROTATOR CUFF REPAIR Right 05/2009   TONSILLECTOMY  1977   TUBAL LIGATION     VIDEO BRONCHOSCOPY WITH ENDOBRONCHIAL NAVIGATION N/A 10/18/2020   Procedure: VIDEO BRONCHOSCOPY WITH ENDOBRONCHIAL NAVIGATION;  Surgeon: Collene Gobble, MD;  Location: [place];  Service: Thoracic;  Laterality: N/A;   VIDEO BRONCHOSCOPY WITH ENDOBRONCHIAL ULTRASOUND N/A 10/18/2020   Procedure: VIDEO BRONCHOSCOPY WITH ENDOBRONCHIAL ULTRASOUND;  Surgeon: Collene Gobble, MD;  Location: [place];  Service: Thoracic;  Laterality: N/A;   WISDOM TOOTH EXTRACTION     Past Surgical History:  Procedure Laterality Date   CESAREAN SECTION     X 3   COLONOSCOPY  2013, 2016, 2019   hx colon polyps   DILATION AND CURETTAGE OF UTERUS     FUDUCIAL PLACEMENT Right 10/18/2020   Procedure: PLACEMENT OF FUDUCIAL;  Surgeon: Collene Gobble, MD;  [facility];  Service: Thoracic;  Laterality: Right;   IR IMAGING GUIDED PORT INSERTION  11/29/2020   SHOULDER ARTHROSCOPY WITH OPEN ROTATOR CUFF REPAIR Right 05/2009   TONSILLECTOMY  1977   TUBAL LIGATION     VIDEO BRONCHOSCOPY WITH ENDOBRONCHIAL NAVIGATION N/A 10/18/2020   Procedure: VIDEO BRONCHOSCOPY WITH ENDOBRONCHIAL NAVIGATION;  Surgeon: Collene Gobble, MD;  Location: [place];  Service: Thoracic;  Laterality: N/A;   VIDEO BRONCHOSCOPY WITH ENDOBRONCHIAL ULTRASOUND N/A 10/18/2020   Procedure: VIDEO BRONCHOSCOPY WITH ENDOBRONCHIAL ULTRASOUND;  Surgeon: Collene Gobble, MD;  Location: [place];  Service: Thoracic;  Laterality: N/A;   WISDOM TOOTH EXTRACTION     Past Medical History:  Diagnosis Date   Allergy    Arthritis    Basal cell carcinoma 2018   Removed from Right neck per patient    Diverticulosis 2013   HLD (hyperlipidemia)    Hx of adenomatous colonic polyps 2013   OAB (overactive bladder)    Pre-diabetes    Small cell lung cancer ([place]) 11/09/2020   BP 116/76   Pulse (!) 103   Ht 5\' 8"  (1.727 m)   Wt 179 lb (81.2 kg)    LMP  (LMP Unknown)   SpO2 98%   BMI 27.22 kg/m   Opioid Risk Score:   Fall Risk Score:  `1  Depression screen [facility] 2/9     04/19/2022    1:06 PM 01/24/2022   10:47 AM 10/25/2021   10:27 AM 07/12/2021   10:17 AM 06/14/2021    8:24 AM 04/18/2021   11:05 AM 08/23/2020    5:11 PM  Depression screen PHQ 2/9  Decreased Interest 0 0 0 0 0 0 0  Down, Depressed, Hopeless 0 0 0 0 0 0 0  PHQ - 2 Score 0 0 0 0 0 0 0     Review of Systems  Musculoskeletal:        Right shoulder pain Right knee pain  All other systems reviewed and are negative.     Objective:   Physical Exam Vitals and nursing note reviewed.  Constitutional:      Appearance: Normal appearance.  Cardiovascular:     Rate and Rhythm: Normal rate and regular rhythm.     Pulses: Normal pulses.     Heart sounds: Normal heart sounds.  Pulmonary:     Effort: Pulmonary effort is normal.     Breath sounds: Normal breath sounds.  Musculoskeletal:     Cervical back: Normal range of motion and neck supple.     Comments: Normal Muscle Bulk and Muscle Testing Reveals:  Upper Extremities: Right: Decreased ROM 90 Degrees and Muscle Strength 5/5 Right AC Joint Tenderness Left Upper Extremity: Full ROM and Muscle Strength 5/5 Thoracic Paraspinal Tenderness  Lower Extremities : Full ROM and Muscle Strength 5/5 Arises from Chair with ease Narrow Based Gait     Skin:    General: Skin is warm and dry.  Neurological:     Mental Status: She is alert and oriented to person, place, and time.  Psychiatric:        Mood and Affect: Mood normal.        Behavior: Behavior normal.         Assessment & Plan:  1.Cervical spinal stenosis without evidence of radiculopathy/ Cervical Radiculitis . Chronic neck pain: Refilled Tylenol #4  300/60mg  daily, #30. 05/01/2022. We will continue the opioid monitoring program, this consists of regular clinic visits, examinations, urine drug screen, pill counts as well as use of [place]  Controlled Substance Reporting system. A 12 month History has been reviewed on the [facility] on 05/01/2022. 2. Right shoulder rotator cuff tear, status post repair with chronic pain and contracture : Continue with exercise regime as tolerated .05/01/2022. 3. Insomnia: Continue Melatonin. 05/01/2022 4. Chronic Bilateral Lower Back Pain without Sciatica: No complaints today. Continue HEP as tolerated. Continue to Monitor. 05/01/2021  5. Right Greater Trochanter Bursitis: No complaints Today. Continue to Alternate Ice and Heat Therapy. Continue to Monitor 05/01/2022 6. Chronic right Sided Thoracic Back Pain: Continue current medication regime. Continue HEP as tolerated. Continue to monitor. 05/01/2022  7. Right Knee Pain: RX: X-ray   F/U in 3 months

## 2022-05-04 ENCOUNTER — Telehealth: Payer: Self-pay | Admitting: *Deleted

## 2022-05-04 LAB — TOXASSURE SELECT,+ANTIDEPR,UR

## 2022-05-04 NOTE — Telephone Encounter (Signed)
Urine drug screen for this encounter is consistent for prescribed medication 

## 2022-05-07 ENCOUNTER — Telehealth: Payer: Self-pay | Admitting: *Deleted

## 2022-05-07 DIAGNOSIS — M25561 Pain in right knee: Secondary | ICD-10-CM

## 2022-05-07 NOTE — Telephone Encounter (Signed)
Order changed.

## 2022-05-08 ENCOUNTER — Ambulatory Visit (HOSPITAL_BASED_OUTPATIENT_CLINIC_OR_DEPARTMENT_OTHER)
Admission: RE | Admit: 2022-05-08 | Discharge: 2022-05-08 | Disposition: A | Discharge status: Home / Self Care | Payer: Medicare Other | Source: Ambulatory Visit | Attending: Registered Nurse | Admitting: Registered Nurse

## 2022-05-08 ENCOUNTER — Ambulatory Visit (HOSPITAL_BASED_OUTPATIENT_CLINIC_OR_DEPARTMENT_OTHER)
Admission: RE | Admit: 2022-05-08 | Discharge: 2022-05-08 | Disposition: A | Discharge status: Home / Self Care | Payer: Medicare Other | Source: Ambulatory Visit | Attending: Internal Medicine | Admitting: Internal Medicine

## 2022-05-08 DIAGNOSIS — Z452 Encounter for adjustment and management of vascular access device: Secondary | ICD-10-CM | POA: Diagnosis not present

## 2022-05-08 DIAGNOSIS — M25561 Pain in right knee: Secondary | ICD-10-CM | POA: Insufficient documentation

## 2022-05-08 DIAGNOSIS — Z95828 Presence of other vascular implants and grafts: Secondary | ICD-10-CM

## 2022-05-08 HISTORY — PX: IR REMOVAL TUN ACCESS W/ PORT W/O FL MOD SED: IMG2290

## 2022-05-08 MED ORDER — LIDOCAINE-EPINEPHRINE (PF) 2 %-1:200000 IJ SOLN
INTRAMUSCULAR | Status: DC | PRN
  Administered 2022-05-08: 10 mL

## 2022-05-08 MED ORDER — LIDOCAINE-EPINEPHRINE 1 %-1:100000 IJ SOLN
INTRAMUSCULAR | Status: AC
  Filled 2022-05-08: qty 1

## 2022-05-10 ENCOUNTER — Encounter: Payer: Self-pay | Admitting: Internal Medicine

## 2022-05-10 ENCOUNTER — Ambulatory Visit: Payer: Medicare Other | Attending: Internal Medicine | Admitting: Internal Medicine

## 2022-05-10 VITALS — BP 110/78 | HR 94 | Ht 68.0 in | Wt 186.4 lb

## 2022-05-10 DIAGNOSIS — R002 Palpitations: Secondary | ICD-10-CM | POA: Diagnosis not present

## 2022-05-10 NOTE — Progress Notes (Signed)
Cardiology Office Note:    Date:  05/10/2022   ID:  Susan Cortez, DOB [birth year], MRN [id number]  PCP:  Darreld Mclean, MD   [facility] Providers Cardiologist:  None     Referring MD: Darreld Mclean, MD   No chief complaint on file. Tachycardia  History of Present Illness:    Susan Cortez is a 61 y.o. female with a hx of limited stage small cell carcinoma diagnosed 09/2020 of the RUL s/p chemoradiation  completed on Spring of 2022, whole brain radiaiton completed 03/28/21 referral for tachycardia   She's prescribed albuterol but she does not use it. She's on metoprolol 12.5 mg XL. She feels weak and dizzy with it. She stays in bed. She is very fatigued. She is working on hydration. She can feel her heart rate racing at times. Her smart watch says sinus rhythm. She denies syncope. She gets some LH/dizzy with standing up.   She denies fevers,  dehydration, pain or anxiety. She has some loss of appetite. She has persistent neuropathy. She's had sinus tachycardia going back to February of 2018. She has not seen a cardiology, no stress test approximately 37 years ago which was normal. No echo. She's had CT scans , showing no significant atherosclerotic calcification.  05/29/2021- Sinus tachycardia HR 113 BPM, RAE  No left chest radiation, ECOG 0 Chemo-cisplatin, etoposide No metastasis  Family Hx: Mother- atrial fibrillation, atrial flutter, CHF, htn, hypothyroidism, CKD, HLD. Father - heart attack s/p PCI in his 28s. Brother had 2 Mis.  Social: smokes pack per day. No etoh, no drug use. She has three children.   Interim Hx: She notes some improvement with metoprolol. She is not planned for further cancer therapy. They are recommending repeat CT scan  Interim hx 05/10/2022: Her port was removed.  Metoprolol is doing well. She has no palpations. She is still smoking.   Past Medical History:  Diagnosis Date   Allergy    Arthritis    Basal cell carcinoma 2018    Removed from Right neck per patient    Diverticulosis 2013   HLD (hyperlipidemia)    Hx of adenomatous colonic polyps 2013   OAB (overactive bladder)    Pre-diabetes    Small cell lung cancer ([place]) 11/09/2020    Past Surgical History:  Procedure Laterality Date   CESAREAN SECTION     X 3   COLONOSCOPY  2013, 2016, 2019   hx colon polyps   DILATION AND CURETTAGE OF UTERUS     FUDUCIAL PLACEMENT Right 10/18/2020   Procedure: PLACEMENT OF FUDUCIAL;  Surgeon: Collene Gobble, MD;  Location: [place];  Service: Thoracic;  Laterality: Right;   IR IMAGING GUIDED PORT INSERTION  11/29/2020   IR REMOVAL TUN ACCESS W/ PORT W/O FL MOD SED  05/08/2022   SHOULDER ARTHROSCOPY WITH OPEN ROTATOR CUFF REPAIR Right 05/2009   TONSILLECTOMY  1977   TUBAL LIGATION     VIDEO BRONCHOSCOPY WITH ENDOBRONCHIAL NAVIGATION N/A 10/18/2020   Procedure: VIDEO BRONCHOSCOPY WITH ENDOBRONCHIAL NAVIGATION;  Surgeon: Collene Gobble, MD;  Location: [place];  Service: Thoracic;  Laterality: N/A;   VIDEO BRONCHOSCOPY WITH ENDOBRONCHIAL ULTRASOUND N/A 10/18/2020   Procedure: VIDEO BRONCHOSCOPY WITH ENDOBRONCHIAL ULTRASOUND;  Surgeon: Collene Gobble, MD;  Location: [place];  Service: Thoracic;  Laterality: N/A;   WISDOM TOOTH EXTRACTION      Current Medications: No outpatient medications have been marked as taking for the 05/10/22 encounter (Appointment) with Janina Mayo,  MD.     Allergies:   Lyrica [pregabalin], Butrans [buprenorphine], Chantix [varenicline], Diclofenac, Doxycycline, Meloxicam, Tolmetin, and Nsaids   Social History   Socioeconomic History   Marital status: Divorced    Spouse name: Not on file   Number of children: Not on file   Years of education: Not on file   Highest education level: Not on file  Occupational History   Not on file  Tobacco Use   Smoking status: Every Day    Packs/day: 1.00    Years: 45.00    Total pack years: 45.00    Types: Cigarettes   Smokeless tobacco: Never   Tobacco  comments:    1 pack smoked per day ARJ 09/01/21  Vaping Use   Vaping Use: Former  Substance and Sexual Activity   Alcohol use: No   Drug use: No   Sexual activity: Not on file    Comment: Tubal Ligation  Other Topics Concern   Not on file  Social History Narrative   Not on file   Social Determinants of Health   Financial Resource Strain: Low Risk  (04/18/2021)   Overall Financial Resource Strain (CARDIA)    Difficulty of Paying Living Expenses: Not hard at all  Food Insecurity: No Food Insecurity (04/18/2021)   Hunger Vital Sign    Worried About Running Out of Food in the Last Year: Never true    [place] in the Last Year: Never true  Transportation Needs: No Transportation Needs (04/18/2021)   PRAPARE - Hydrologist (Medical): No    Lack of Transportation (Non-Medical): No  Physical Activity: Inactive (04/18/2021)   Exercise Vital Sign    Days of Exercise per Week: 0 days    Minutes of Exercise per Session: 0 min  Stress: Stress Concern Present (04/18/2021)   [place]    Feeling of Stress : To some extent  Social Connections: Socially Isolated (04/18/2021)   Social Connection and Isolation Panel [NHANES]    Frequency of Communication with Friends and Family: More than three times a week    Frequency of Social Gatherings with Friends and Family: More than three times a week    Attends Religious Services: Never    Marine scientist or Organizations: No    Attends Music therapist: Never    Marital Status: Divorced     Family History: The patient's family history includes Diabetes in her father; Heart disease in her father; Hyperlipidemia in her father. There is no history of Colon cancer, Colon polyps, Esophageal cancer, Rectal cancer, or Stomach cancer.  ROS:   Please see the history of present illness.     All other systems reviewed and are  negative.  EKGs/Labs/Other Studies Reviewed:    The following studies were reviewed today:   EKG:  EKG is  ordered today.  The ekg ordered today demonstrates   07/19/2021 - Sinus tachycardia HR 114 bpm  Recent Labs: 05/29/2021: TSH 2.615 12/28/2021: ALT 19; BUN 11; Creatinine 0.75; Hemoglobin 12.8; Platelet Count 330; Potassium 3.9; Sodium 137  Recent Lipid Panel    Component Value Date/Time   CHOL 168 06/14/2021 0847   TRIG 145.0 06/14/2021 0847   HDL 51.40 06/14/2021 0847   CHOLHDL 3 06/14/2021 0847   VLDL 29.0 06/14/2021 0847   LDLCALC 88 06/14/2021 0847   LDLCALC 94 06/08/2020 1050     Risk Assessment/Calculations:  Physical Exam:    VS:   Vitals:   05/10/22 0940  BP: 110/78  Pulse: 94  SpO2: 98%     LMP  (LMP Unknown)     Wt Readings from Last 3 Encounters:  05/01/22 179 lb (81.2 kg)  01/24/22 178 lb (80.7 kg)  01/01/22 177 lb 3 oz (80.4 kg)     GEN:  Well nourished, well developed in no acute distress.  HEENT: Normal NECK: No JVD; No carotid bruits LYMPHATICS: No lymphadenopathy CARDIAC: tachycardia, no murmurs, rubs, gallops RESPIRATORY:  Clear to auscultation without rales, wheezing or rhonchi  ABDOMEN: Soft, non-tender, non-distended MUSCULOSKELETAL:  No edema; No deformity  SKIN: Warm and dry NEUROLOGIC:  Alert and oriented x 3 PSYCHIATRIC:  Normal affect   ASSESSMENT:    #Sinus tachycardia: She has persistent sinus tachycardia. Can be related to being intravascularly dry with orthostatic symptoms; however she's had high rates going back to 2018. No medications are contributing. She has no structural heart disease. Malignancy can increase sympathetic tone, this may contribute (Majerova et al.  Increased sympathetic modulation in breast cancer survivors determined by measurement of heart rate variability. Nature/Scientific Reports. 2022). This may be due to inappropriate sinus tachycardia. She continues to smoke, this contributes as  well. Will continue metop 12.5 mg XL daily.  #Cardio-onc: Cisplatin is associated with vascular endothelial dysfunction and can increased CVD risk, study population are patients with testicular cancer. Lysbeth Galas et al. Comprehensive Characterization of the Vascular Effects of Cisplatin-Based Chemotherapy in Patients With Testicular Cancer. [place]). Will continue to monitor  Smoking Cessation: patch and chantix didn't work. [facility] a program at [facility] but stopped with COVID. Discussed utilizing smoking cessation hotline to help   PLAN:    In order of problems listed above:   Follow up 12 months      Medication Adjustments/Labs and Tests Ordered: Current medicines are reviewed at length with the patient today.  Concerns regarding medicines are outlined above.   Signed, Janina Mayo, MD  05/10/2022 9:25 AM    [facility]

## 2022-05-10 NOTE — Patient Instructions (Addendum)
Medication Instructions:  Your physician recommends that you continue on your current medications as directed. Please refer to the Current Medication list given to you today.  *If you need a refill on your cardiac medications before your next appointment, please call your pharmacy*   Lab Work: None  Testing/Procedures: None   Follow-Up: At [facility], you and your health needs are our priority.  As part of our continuing mission to provide you with exceptional heart care, we have created designated Provider Care Teams.  These Care Teams include your primary Cardiologist (physician) and Advanced Practice Providers (APPs -  Physician Assistants and Nurse Practitioners) who all work together to provide you with the care you need, when you need it.  We recommend signing up for the patient portal called "MyChart".  Sign up information is provided on this After Visit Summary.  MyChart is used to connect with patients for Virtual Visits (Telemedicine).  Patients are able to view lab/test results, encounter notes, upcoming appointments, etc.  Non-urgent messages can be sent to your provider as well.   To learn more about what you can do with MyChart, go to [email or web address].    Your next appointment:   1 year(s)  The format for your next appointment:   In Person  Provider:   Janina Mayo, MD     Other Instructions   Important Information About Sugar

## 2022-05-14 ENCOUNTER — Inpatient Hospital Stay: Payer: Medicare Other

## 2022-05-15 ENCOUNTER — Telehealth: Payer: Self-pay | Admitting: Registered Nurse

## 2022-05-15 NOTE — Telephone Encounter (Signed)
Call placed to [name] results reviewed, she verbalizes understanding.

## 2022-05-23 ENCOUNTER — Other Ambulatory Visit: Payer: Self-pay | Admitting: Family Medicine

## 2022-05-23 DIAGNOSIS — R000 Tachycardia, unspecified: Secondary | ICD-10-CM

## 2022-06-07 ENCOUNTER — Encounter: Payer: Self-pay | Admitting: Radiation Oncology

## 2022-06-11 ENCOUNTER — Other Ambulatory Visit: Payer: Self-pay | Admitting: Radiation Oncology

## 2022-06-11 DIAGNOSIS — F40240 Claustrophobia: Secondary | ICD-10-CM

## 2022-06-11 MED ORDER — LORAZEPAM 1 MG PO TABS: ORAL_TABLET | ORAL | 0 refills | Status: DC

## 2022-06-12 NOTE — Progress Notes (Signed)
[facility] [address] [place], [place] [address] [phone]  Date:  06/18/2022   Name:  Susan Cortez   DOB:  [birth year]   MRN:  [id number]  PCP:  Darreld Mclean, MD    Chief Complaint: Annual Exam (Concerns/ questions: none/Flu shot today: declines)   History of Present Illness:  Susan Cortez is a 61 y.o. very pleasant female patient who presents with the following:  Pt seen today for a CPE Last seen by myself about one year ago - from my notes at that time: History of cervical spinal stenosis, lower back pain, intermittent claudication, dyslipidemia, prediabetes. 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 She has questionable metastases to the skull She is feeling really tired.  She did brain radiation 10 treatments- finished in August. This really knocked her out She has less appetite  She has lost some weight which she is pleased about  She notes she is feeling much better now since she completed radiation She has lost about 45 lbs; this seemed to happen during her radiation due to decrease in appetite.  She has been able to maintain the weight loss She is getting a CT of her lungs and a MRI brain within the next month or so - Dr Julien Nordmann is her oncologist   She would like to go back on myrbetriq for her bladder -she was not sure if it was helping her previously, but would like to give it a try again  Recommend covid booster Tetanus- give today  Flu- pt declines today Colon UTD Mammo UTD Pap UTD  Cardiology visit for palpitations last month-  #Sinus tachycardia: She has persistent sinus tachycardia. Can be related to being intravascularly dry with  orthostatic symptoms; however she's had high rates going back to 2018. No medications are contributing. She has no structural heart disease. Malignancy can increase sympathetic tone, this may contribute (Majerova et al.  Increased sympathetic modulation in breast cancer survivors determined by measurement of heart rate variability. Nature/Scientific Reports. 2022). This may be due to inappropriate sinus tachycardia. She continues to smoke, this contributes as well. Will continue metop 12.5 mg XL daily.   #Cardio-onc: Cisplatin is associated with vascular endothelial dysfunction and can increased CVD risk, study population are patients with testicular cancer. Lysbeth Galas et al. Comprehensive Characterization of the Vascular Effects of Cisplatin-Based Chemotherapy in Patients With Testicular Cancer. [place]). Will continue to monitor   Smoking Cessation: patch and chantix didn't work. Did a program at [facility] but stopped with COVID. Discussed utilizing smoking cessation hotline to help  Patient Active Problem List   Diagnosis Date Noted   Tobacco use 06/01/2021   Tachycardia 05/29/2021   Cough 03/28/2021   Port-A-Cath in place 01/02/2021   Chemotherapy induced neutropenia ([place]) 01/02/2021   COPD, mild ([place]) 12/19/2020   Encounter for antineoplastic chemotherapy 12/05/2020   Goals of care, counseling/discussion 11/09/2020   Small cell carcinoma of right lung (Susan Cortez) 11/09/2020   Mediastinal adenopathy 10/18/2020   Pulmonary nodule 1 cm or greater in diameter 06/28/2020   Encounter for screening for lung cancer 01/04/2020   Porokeratosis 12/02/2019   Plantar flexed metatarsal bone of left foot 12/02/2019   Plantar flexed  metatarsal bone of right foot 12/02/2019   Prediabetes 05/31/2019   Dyslipidemia 03/01/2018   De Quervain's disease (tenosynovitis) 03/28/2017   Chronic bilateral low back pain without sciatica 03/28/2017   Sebaceous hyperplasia of face 06/26/2016   Chronic right  shoulder pain 01/02/2016   Adenoma of left adrenal gland 12/26/2015   Intermittent claudication ([place]) 12/26/2015   Rotator cuff (capsule) sprain 03/26/2014   Spinal stenosis in cervical region 03/26/2014   Chronic pain 03/26/2014    Past Medical History:  Diagnosis Date   Allergy    Arthritis    Basal cell carcinoma 2018   Removed from Right neck per patient    Diverticulosis 2013   HLD (hyperlipidemia)    Hx of adenomatous colonic polyps 2013   OAB (overactive bladder)    Pre-diabetes    Small cell lung cancer ([place]) 11/09/2020    Past Surgical History:  Procedure Laterality Date   CESAREAN SECTION     X 3   COLONOSCOPY  2013, 2016, 2019   hx colon polyps   DILATION AND CURETTAGE OF UTERUS     FUDUCIAL PLACEMENT Right 10/18/2020   Procedure: PLACEMENT OF FUDUCIAL;  Surgeon: Collene Gobble, MD;  Location: [facility] OR;  Service: Thoracic;  Laterality: Right;   IR IMAGING GUIDED PORT INSERTION  11/29/2020   IR REMOVAL TUN ACCESS W/ PORT W/O FL MOD SED  05/08/2022   SHOULDER ARTHROSCOPY WITH OPEN ROTATOR CUFF REPAIR Right 05/2009   TONSILLECTOMY  1977   TUBAL LIGATION     VIDEO BRONCHOSCOPY WITH ENDOBRONCHIAL NAVIGATION N/A 10/18/2020   Procedure: VIDEO BRONCHOSCOPY WITH ENDOBRONCHIAL NAVIGATION;  Surgeon: Collene Gobble, MD;  Location: [facility] OR;  Service: Thoracic;  Laterality: N/A;   VIDEO BRONCHOSCOPY WITH ENDOBRONCHIAL ULTRASOUND N/A 10/18/2020   Procedure: VIDEO BRONCHOSCOPY WITH ENDOBRONCHIAL ULTRASOUND;  Surgeon: Collene Gobble, MD;  Location: [facility] OR;  Service: Thoracic;  Laterality: N/A;   WISDOM TOOTH EXTRACTION      Social History   Tobacco Use   Smoking status: Every Day    Packs/day: 1.00    Years: 45.00    Total pack years: 45.00    Types: Cigarettes   Smokeless tobacco: Never   Tobacco comments:    1 pack smoked per day ARJ 09/01/21  Vaping Use   Vaping Use: Former  Substance Use Topics   Alcohol use: No   Drug use: No    Family History  Problem Relation Age of  Onset   Diabetes Father    Heart disease Father    Hyperlipidemia Father    Colon cancer Neg Hx    Colon polyps Neg Hx    Esophageal cancer Neg Hx    Rectal cancer Neg Hx    Stomach cancer Neg Hx     Allergies  Allergen Reactions   Lyrica [Pregabalin] Swelling    Headache,severe eye pain, swelling   Butrans [Buprenorphine] Itching    Itching and redness at the area of the patch.   Chantix [Varenicline] Other (See Comments)    Strange dreams/hallucinations   Diclofenac Swelling   Doxycycline Nausea Only   Meloxicam Swelling   Tolmetin Other (See Comments)    Stomach pain   Nsaids Other (See Comments)    Stomach pain    Medication list has been reviewed and updated.  Current Outpatient Medications on File Prior to Visit  Medication Sig Dispense Refill   acetaminophen-codeine (TYLENOL #4) 300-60 MG tablet Take 1 tablet by mouth daily as needed for moderate  pain. 30 tablet 2   CALCIUM PO Take 650 mg by mouth daily.     Cholecalciferol (VITAMIN D3) 125 MCG (5000 UT) CAPS Take 5,000 Units by mouth daily.     Cyanocobalamin (B-12) 100 MCG TABS      gabapentin (NEURONTIN) 100 MG capsule TAKE 1 CAPSULE BY MOUTH TWICE A DAY 60 capsule 1   lidocaine-prilocaine (EMLA) cream APPLY 1 APPLICATION TOPICALLY AS NEEDED. 30 g 0   LORazepam (ATIVAN) 1 MG tablet TAKE 0.5 TABLET BY MOUTH EVERY 8 HOURS. TAKE 1 TABLET 30 MINUTES PRIOR TO MRI OR RADIATION 14 tablet 0   lovastatin (MEVACOR) 20 MG tablet TAKE 1 TABLET BY MOUTH EVERYDAY AT BEDTIME 90 tablet 1   metoprolol succinate (TOPROL-XL) 25 MG 24 hr tablet TAKE 1/2 TABLET BY MOUTH EVERY DAY 45 tablet 3   Multiple Vitamins-Minerals (MULTIVITAMIN ADULT, MINERALS,) TABS      No current facility-administered medications on file prior to visit.    Review of Systems:  As per HPI- otherwise negative.   Physical Examination: Vitals:   06/18/22 0822  BP: 122/60  Pulse: (!) 115  Resp: 18  Temp: 97.6 F (36.4 C)  SpO2: 97%   Vitals:    06/18/22 0822  Weight: 177 lb (80.3 kg)  Height: 5\' 9"  (1.753 m)   Body mass index is 26.14 kg/m. Ideal Body Weight: Weight in (lb) to have BMI = 25: 168.9  GEN: no acute distress.  Minimal overweight, looks well HEENT: Atraumatic, Normocephalic.  Bilateral TM wnl, oropharynx normal.  PEERL,EOMI.   Ears and Nose: No external deformity. CV: RRR, No M/G/R. No JVD. No thrill. No extra heart sounds. PULM: Noted minimal wheezing bilaterally, crackles, rhonchi. No retractions. No resp. distress. No accessory muscle use. ABD: S, NT, ND, +BS. No rebound. No HSM. EXTR: No c/c/e PSYCH: Normally interactive. Conversant.  Noted tachycardia.  Patient admits she recently consumed coffee and nicotine.  Improved on recheck.  Denies any shortness of breath  Pulse Readings from Last 3 Encounters:  06/18/22 (!) 115  05/10/22 94  05/01/22 100    Assessment and Plan: Physical exam  Prediabetes - Plan: Hemoglobin A1c  Dyslipidemia - Plan: Lipid panel, CT CARDIAC SCORING (SELF PAY ONLY)  Immunization due - Plan: Td vaccine greater than or equal to 7yo preservative free IM  Wheezing - Plan: predniSONE (DELTASONE) 20 MG tablet  Screening for thyroid disorder - Plan: TSH  Physical exam today- encouraged healthy diet and exercise routine Will plan further follow- up pending labs. We discussed a coronary calcium score for restratification, she is interested in having this done Tetanus vaccine today Mild COPD exacerbation, gave 6 days of prednisone-asked her to let me know her breathing should worsen Will plan further follow- up pending labs.  Signed Lamar Blinks, MD Received labs as below, message to patient Results for orders placed or performed in visit on 06/18/22  Hemoglobin A1c  Result Value Ref Range   Hgb A1c MFr Bld 5.7 4.6 - 6.5 %  Lipid panel  Result Value Ref Range   Cholesterol 155 0 - 200 mg/dL   Triglycerides 151.0 (H) 0.0 - 149.0 mg/dL   HDL 51.70 >39.00 mg/dL   VLDL  30.2 0.0 - 40.0 mg/dL   LDL Cholesterol 73 0 - 99 mg/dL   Total CHOL/HDL Ratio 3    NonHDL 102.89   TSH  Result Value Ref Range   TSH 1.42 0.35 - 5.50 uIU/mL

## 2022-06-13 ENCOUNTER — Encounter: Payer: Self-pay | Admitting: Internal Medicine

## 2022-06-18 ENCOUNTER — Encounter: Payer: Self-pay | Admitting: Family Medicine

## 2022-06-18 ENCOUNTER — Ambulatory Visit (INDEPENDENT_AMBULATORY_CARE_PROVIDER_SITE_OTHER): Payer: Medicare Other | Admitting: Family Medicine

## 2022-06-18 VITALS — BP 122/60 | HR 100 | Temp 97.6°F | Resp 18 | Ht 69.0 in | Wt 177.0 lb

## 2022-06-18 DIAGNOSIS — Z0000 Encounter for general adult medical examination without abnormal findings: Secondary | ICD-10-CM | POA: Diagnosis not present

## 2022-06-18 DIAGNOSIS — R062 Wheezing: Secondary | ICD-10-CM | POA: Diagnosis not present

## 2022-06-18 DIAGNOSIS — E785 Hyperlipidemia, unspecified: Secondary | ICD-10-CM

## 2022-06-18 DIAGNOSIS — Z23 Encounter for immunization: Secondary | ICD-10-CM

## 2022-06-18 DIAGNOSIS — Z1329 Encounter for screening for other suspected endocrine disorder: Secondary | ICD-10-CM

## 2022-06-18 DIAGNOSIS — R7303 Prediabetes: Secondary | ICD-10-CM

## 2022-06-18 DIAGNOSIS — R32 Unspecified urinary incontinence: Secondary | ICD-10-CM

## 2022-06-18 LAB — TSH: TSH: 1.42 u[IU]/mL (ref 0.35–5.50)

## 2022-06-18 LAB — HEMOGLOBIN A1C: Hgb A1c MFr Bld: 5.7 % (ref 4.6–6.5)

## 2022-06-18 LAB — LIPID PANEL
Cholesterol: 155 mg/dL (ref 0–200)
HDL: 51.7 mg/dL (ref >39.00)
LDL Cholesterol: 73 mg/dL (ref 0–99)
NonHDL: 102.89
Total CHOL/HDL Ratio: 3
Triglycerides: 151 mg/dL — ABNORMAL HIGH (ref 0.0–149.0)
VLDL: 30.2 mg/dL (ref 0.0–40.0)

## 2022-06-18 MED ORDER — PREDNISONE 20 MG PO TABS: ORAL_TABLET | ORAL | 0 refills | Status: DC

## 2022-06-18 MED ORDER — MIRABEGRON ER 50 MG PO TB24: 50.0000 mg | ORAL_TABLET | Freq: Every day | ORAL | 3 refills | Status: DC

## 2022-06-18 NOTE — Patient Instructions (Signed)
Good to see you today- I will be in touch with your labs asap I will let you know what Dr Havery Moros has to say about your next colonoscopy Tetanus today Recommend the latest covid and a dose of RSV this fall if not done already I will be in touch with your coronary calcium test result Assuming all is well please see me in about 6 months

## 2022-06-19 ENCOUNTER — Encounter: Payer: Self-pay | Admitting: Family Medicine

## 2022-06-21 ENCOUNTER — Ambulatory Visit (HOSPITAL_BASED_OUTPATIENT_CLINIC_OR_DEPARTMENT_OTHER)
Admission: RE | Admit: 2022-06-21 | Discharge: 2022-06-21 | Disposition: A | Discharge status: Home / Self Care | Payer: Medicare Other | Source: Ambulatory Visit | Attending: Family Medicine | Admitting: Family Medicine

## 2022-06-21 ENCOUNTER — Other Ambulatory Visit (HOSPITAL_BASED_OUTPATIENT_CLINIC_OR_DEPARTMENT_OTHER): Payer: Medicare Other

## 2022-06-21 DIAGNOSIS — E785 Hyperlipidemia, unspecified: Secondary | ICD-10-CM | POA: Insufficient documentation

## 2022-07-02 ENCOUNTER — Ambulatory Visit (HOSPITAL_COMMUNITY)
Admission: RE | Admit: 2022-07-02 | Discharge: 2022-07-02 | Disposition: A | Discharge status: Home / Self Care | Payer: Medicare Other | Source: Ambulatory Visit | Attending: Internal Medicine | Admitting: Internal Medicine

## 2022-07-02 ENCOUNTER — Other Ambulatory Visit: Payer: Medicare Other

## 2022-07-02 ENCOUNTER — Encounter (HOSPITAL_COMMUNITY): Payer: Self-pay

## 2022-07-02 ENCOUNTER — Inpatient Hospital Stay: Payer: Medicare Other | Attending: Internal Medicine

## 2022-07-02 DIAGNOSIS — C3411 Malignant neoplasm of upper lobe, right bronchus or lung: Secondary | ICD-10-CM | POA: Insufficient documentation

## 2022-07-02 DIAGNOSIS — I700 Atherosclerosis of aorta: Secondary | ICD-10-CM | POA: Insufficient documentation

## 2022-07-02 DIAGNOSIS — G629 Polyneuropathy, unspecified: Secondary | ICD-10-CM | POA: Insufficient documentation

## 2022-07-02 DIAGNOSIS — E785 Hyperlipidemia, unspecified: Secondary | ICD-10-CM | POA: Insufficient documentation

## 2022-07-02 DIAGNOSIS — E041 Nontoxic single thyroid nodule: Secondary | ICD-10-CM | POA: Insufficient documentation

## 2022-07-02 DIAGNOSIS — Z85828 Personal history of other malignant neoplasm of skin: Secondary | ICD-10-CM | POA: Insufficient documentation

## 2022-07-02 DIAGNOSIS — D3502 Benign neoplasm of left adrenal gland: Secondary | ICD-10-CM | POA: Insufficient documentation

## 2022-07-02 DIAGNOSIS — Z86010 Personal history of colonic polyps: Secondary | ICD-10-CM | POA: Insufficient documentation

## 2022-07-02 DIAGNOSIS — R911 Solitary pulmonary nodule: Secondary | ICD-10-CM | POA: Diagnosis not present

## 2022-07-02 DIAGNOSIS — C3490 Malignant neoplasm of unspecified part of unspecified bronchus or lung: Secondary | ICD-10-CM | POA: Insufficient documentation

## 2022-07-02 DIAGNOSIS — Z79899 Other long term (current) drug therapy: Secondary | ICD-10-CM | POA: Insufficient documentation

## 2022-07-02 DIAGNOSIS — Z9221 Personal history of antineoplastic chemotherapy: Secondary | ICD-10-CM | POA: Insufficient documentation

## 2022-07-02 LAB — CMP (CANCER CENTER ONLY)
ALT: 21 U/L (ref 0–44)
AST: 20 U/L (ref 15–41)
Albumin: 4.3 g/dL (ref 3.5–5.0)
Alkaline Phosphatase: 53 U/L (ref 38–126)
Anion gap: 6 (ref 5–15)
BUN: 7 mg/dL — ABNORMAL LOW (ref 8–23)
CO2: 29 mmol/L (ref 22–32)
Calcium: 9.6 mg/dL (ref 8.9–10.3)
Chloride: 103 mmol/L (ref 98–111)
Creatinine: 0.84 mg/dL (ref 0.44–1.00)
GFR, Estimated: >60 mL/min (ref >60)
Glucose, Bld: 97 mg/dL (ref 70–99)
Potassium: 4.5 mmol/L (ref 3.5–5.1)
Sodium: 138 mmol/L (ref 135–145)
Total Bilirubin: 0.5 mg/dL (ref 0.3–1.2)
Total Protein: 7.1 g/dL (ref 6.5–8.1)

## 2022-07-02 LAB — CBC WITH DIFFERENTIAL (CANCER CENTER ONLY)
Abs Immature Granulocytes: 0.02 10*3/uL (ref 0.00–0.07)
Basophils Absolute: 0.1 10*3/uL (ref 0.0–0.1)
Basophils Relative: 1 %
Eosinophils Absolute: 0.1 10*3/uL (ref 0.0–0.5)
Eosinophils Relative: 1 %
HCT: 41.3 % (ref 36.0–46.0)
Hemoglobin: 13.9 g/dL (ref 12.0–15.0)
Immature Granulocytes: 0 %
Lymphocytes Relative: 22 %
Lymphs Abs: 1.9 10*3/uL (ref 0.7–4.0)
MCH: 32 pg (ref 26.0–34.0)
MCHC: 33.7 g/dL (ref 30.0–36.0)
MCV: 95.2 fL (ref 80.0–100.0)
Monocytes Absolute: 0.7 10*3/uL (ref 0.1–1.0)
Monocytes Relative: 8 %
Neutro Abs: 6 10*3/uL (ref 1.7–7.7)
Neutrophils Relative %: 68 %
Platelet Count: 324 10*3/uL (ref 150–400)
RBC: 4.34 MIL/uL (ref 3.87–5.11)
RDW: 14.1 % (ref 11.5–15.5)
WBC Count: 8.7 10*3/uL (ref 4.0–10.5)
nRBC: 0 % (ref 0.0–0.2)

## 2022-07-02 MED ORDER — SODIUM CHLORIDE (PF) 0.9 % IJ SOLN
INTRAMUSCULAR | Status: AC
  Filled 2022-07-02: qty 50

## 2022-07-02 MED ORDER — IOHEXOL 300 MG/ML  SOLN
75.0000 mL | Freq: Once | INTRAMUSCULAR | Status: AC | PRN
  Administered 2022-07-02: 75 mL via INTRAVENOUS

## 2022-07-05 ENCOUNTER — Inpatient Hospital Stay (HOSPITAL_BASED_OUTPATIENT_CLINIC_OR_DEPARTMENT_OTHER): Payer: Medicare Other | Admitting: Internal Medicine

## 2022-07-05 VITALS — BP 119/71 | HR 103 | Temp 98.0°F | Resp 17 | Wt 179.3 lb

## 2022-07-05 DIAGNOSIS — Z85828 Personal history of other malignant neoplasm of skin: Secondary | ICD-10-CM | POA: Diagnosis not present

## 2022-07-05 DIAGNOSIS — C3411 Malignant neoplasm of upper lobe, right bronchus or lung: Secondary | ICD-10-CM | POA: Diagnosis not present

## 2022-07-05 DIAGNOSIS — E041 Nontoxic single thyroid nodule: Secondary | ICD-10-CM | POA: Diagnosis not present

## 2022-07-05 DIAGNOSIS — E785 Hyperlipidemia, unspecified: Secondary | ICD-10-CM | POA: Diagnosis not present

## 2022-07-05 DIAGNOSIS — C3490 Malignant neoplasm of unspecified part of unspecified bronchus or lung: Secondary | ICD-10-CM

## 2022-07-05 DIAGNOSIS — Z9221 Personal history of antineoplastic chemotherapy: Secondary | ICD-10-CM | POA: Diagnosis not present

## 2022-07-05 DIAGNOSIS — Z86010 Personal history of colonic polyps: Secondary | ICD-10-CM | POA: Diagnosis not present

## 2022-07-05 DIAGNOSIS — Z79899 Other long term (current) drug therapy: Secondary | ICD-10-CM | POA: Diagnosis not present

## 2022-07-05 DIAGNOSIS — G629 Polyneuropathy, unspecified: Secondary | ICD-10-CM | POA: Diagnosis not present

## 2022-07-05 DIAGNOSIS — I700 Atherosclerosis of aorta: Secondary | ICD-10-CM | POA: Diagnosis not present

## 2022-07-05 DIAGNOSIS — D3502 Benign neoplasm of left adrenal gland: Secondary | ICD-10-CM | POA: Diagnosis not present

## 2022-07-05 NOTE — Progress Notes (Signed)
[facility] Telephone:([phone]) [phone]   [phone]  OFFICE PROGRESS NOTE  [place], Gay Filler, MD [address] [place] [address]   DIAGNOSIS: Limited stage (T1c, N2, M0) Small Cell Lung Cancer. She presented with a right upper lobe suprahilar nodule/adenopathy, right paratracheal lymph node, and a hypermetabolic right upper lobe nodule. She was diagnosed in February 2022.    PRIOR THERAPY:   1) Systemic chemotherapy with cisplatin 80 mg/m2 on day 1, etoposide 100 mg/m2 on days 1, 2, and 3 IV every 3 weeks with concurrent radiation.  Last dose 02/01/21. Status post 4 cycles.  2) prophylactic cranial irradiation under the care of Dr. Lisbeth Renshaw.  Last treatment on 03/28/2021   CURRENT THERAPY: Observation   INTERVAL HISTORY: Susan Cortez 61 y.o. female returns to the clinic today for 6 months follow-up visit.  The patient is feeling fine today with no concerning complaints except for the peripheral neuropathy.  She has been on treatment with gabapentin as well as PEA with no improvement.  She denied having any current chest pain, shortness of breath, cough or hemoptysis.  She has no nausea, vomiting, diarrhea or constipation.  She has no headache or visual changes.  She is here today for evaluation with repeat CT scan of the chest for restaging of her disease.  MEDICAL HISTORY: Past Medical History:  Diagnosis Date   Allergy    Arthritis    Basal cell carcinoma 2018   Removed from Right neck per patient    Diverticulosis 2013   HLD (hyperlipidemia)    Hx of adenomatous colonic polyps 2013   OAB (overactive bladder)    Pre-diabetes    Small cell lung cancer ([place]) 11/09/2020    ALLERGIES:  is allergic to lyrica [pregabalin], butrans [buprenorphine], chantix [varenicline], diclofenac, doxycycline, meloxicam, tolmetin, and nsaids.  MEDICATIONS:  Current Outpatient Medications  Medication Sig Dispense Refill   acetaminophen-codeine (TYLENOL #4)  300-60 MG tablet Take 1 tablet by mouth daily as needed for moderate pain. 30 tablet 2   CALCIUM PO Take 650 mg by mouth daily.     Cholecalciferol (VITAMIN D3) 125 MCG (5000 UT) CAPS Take 5,000 Units by mouth daily.     Cyanocobalamin (B-12) 100 MCG TABS      gabapentin (NEURONTIN) 100 MG capsule TAKE 1 CAPSULE BY MOUTH TWICE A DAY 60 capsule 1   lidocaine-prilocaine (EMLA) cream APPLY 1 APPLICATION TOPICALLY AS NEEDED. 30 g 0   LORazepam (ATIVAN) 1 MG tablet TAKE 0.5 TABLET BY MOUTH EVERY 8 HOURS. TAKE 1 TABLET 30 MINUTES PRIOR TO MRI OR RADIATION 14 tablet 0   lovastatin (MEVACOR) 20 MG tablet TAKE 1 TABLET BY MOUTH EVERYDAY AT BEDTIME 90 tablet 1   metoprolol succinate (TOPROL-XL) 25 MG 24 hr tablet TAKE 1/2 TABLET BY MOUTH EVERY DAY 45 tablet 3   mirabegron ER (MYRBETRIQ) 50 MG TB24 tablet Take 1 tablet (50 mg total) by mouth daily. 90 tablet 3   Multiple Vitamins-Minerals (MULTIVITAMIN ADULT, MINERALS,) TABS      No current facility-administered medications for this visit.    SURGICAL HISTORY:  Past Surgical History:  Procedure Laterality Date   CESAREAN SECTION     X 3   COLONOSCOPY  2013, 2016, 2019   hx colon polyps   DILATION AND CURETTAGE OF UTERUS     FUDUCIAL PLACEMENT Right 10/18/2020   Procedure: PLACEMENT OF FUDUCIAL;  Surgeon: Collene Gobble, MD;  Location: [facility];  Service: Thoracic;  Laterality: Right;   IR IMAGING GUIDED PORT INSERTION  11/29/2020   IR REMOVAL TUN ACCESS W/ PORT W/O FL MOD SED  05/08/2022   SHOULDER ARTHROSCOPY WITH OPEN ROTATOR CUFF REPAIR Right 05/2009   TONSILLECTOMY  1977   TUBAL LIGATION     VIDEO BRONCHOSCOPY WITH ENDOBRONCHIAL NAVIGATION N/A 10/18/2020   Procedure: VIDEO BRONCHOSCOPY WITH ENDOBRONCHIAL NAVIGATION;  Surgeon: Collene Gobble, MD;  Location: [facility];  Service: Thoracic;  Laterality: N/A;   VIDEO BRONCHOSCOPY WITH ENDOBRONCHIAL ULTRASOUND N/A 10/18/2020   Procedure: VIDEO BRONCHOSCOPY WITH ENDOBRONCHIAL ULTRASOUND;  Surgeon: Collene Gobble, MD;  [facility]: [facility];  Service: Thoracic;  Laterality: N/A;   WISDOM TOOTH EXTRACTION      REVIEW OF SYSTEMS:  A comprehensive review of systems was negative except for: Neurological: positive for paresthesia   PHYSICAL EXAMINATION: General appearance: alert, cooperative, and no distress Head: Normocephalic, without obvious abnormality, atraumatic Neck: no adenopathy, no JVD, supple, symmetrical, trachea midline, and thyroid not enlarged, symmetric, no tenderness/mass/nodules Lymph nodes: Cervical, supraclavicular, and axillary nodes normal. Resp: clear to auscultation bilaterally Back: symmetric, no curvature. ROM normal. No CVA tenderness. Cardio: regular rate and rhythm, S1, S2 normal, no murmur, click, rub or gallop GI: soft, non-tender; bowel sounds normal; no masses,  no organomegaly Extremities: extremities normal, atraumatic, no cyanosis or edema  ECOG PERFORMANCE STATUS: 1 - Symptomatic but completely ambulatory  Blood pressure 119/71, pulse (!) 103, temperature 98 F (36.7 C), resp. rate 17, weight 179 lb 5 oz (81.3 kg), SpO2 99 %.  LABORATORY DATA: Lab Results  Component Value Date   WBC 8.7 07/02/2022   HGB 13.9 07/02/2022   HCT 41.3 07/02/2022   MCV 95.2 07/02/2022   PLT 324 07/02/2022      Chemistry      Component Value Date/Time   NA 138 07/02/2022 1028   K 4.5 07/02/2022 1028   CL 103 07/02/2022 1028   CO2 29 07/02/2022 1028   BUN 7 (L) 07/02/2022 1028   CREATININE 0.84 07/02/2022 1028   CREATININE 0.65 06/08/2020 1050      Component Value Date/Time   CALCIUM 9.6 07/02/2022 1028   ALKPHOS 53 07/02/2022 1028   AST 20 07/02/2022 1028   ALT 21 07/02/2022 1028   BILITOT 0.5 07/02/2022 1028       RADIOGRAPHIC STUDIES: CT Chest W Contrast  Result Date: 07/04/2022 CLINICAL DATA:  Small-cell lung cancer. Restaging. * Tracking Code: BO * EXAM: CT CHEST WITH CONTRAST TECHNIQUE: Multidetector CT imaging of the chest was performed during  intravenous contrast administration. RADIATION DOSE REDUCTION: This exam was performed according to the departmental dose-optimization program which includes automated exposure control, adjustment of the mA and/or kV according to patient size and/or use of iterative reconstruction technique. CONTRAST:  29mL OMNIPAQUE IOHEXOL 300 MG/ML  SOLN COMPARISON:  12/28/2021 FINDINGS: Cardiovascular: The heart size is normal. No substantial pericardial effusion. Mild atherosclerotic calcification is noted in the wall of the thoracic aorta. Mediastinum/Nodes: No mediastinal lymphadenopathy. There is no hilar lymphadenopathy. The esophagus has normal imaging features. 12 mm right thyroid nodule evident. This has been evaluated on previous imaging. (ref: J Am Coll Radiol. 2015 Feb;12(2): 143-50).There is no axillary lymphadenopathy. Lungs/Pleura: Centrilobular emphsyema noted. Chronic interstitial and alveolar changes in the suprahilar and parahilar right lung is compatible with sequelae of radiation therapy. Subtle areas of minimally irregular pleural thickening along the major fissure (57/7) are similar to prior. Tiny superior segment left lower lobe nodule medially on  47/7 is new in the interval measuring about 2 mm. No new suspicious pulmonary nodule or mass in the left lung. No pleural effusion. Upper Abdomen: 2 left adrenal gland nodules are stable. These are non hypermetabolic and characterized as adenomas on PET-CT 11/08/2020. No followup imaging is recommended. Musculoskeletal: No worrisome lytic or sclerotic osseous abnormality. IMPRESSION: 1. Stable exam. No new or progressive findings to suggest recurrent or metastatic disease. 2. Chronic interstitial and alveolar changes in the suprahilar and parahilar right lung is compatible with sequelae of radiation therapy. 3. Tiny 2 mm superior segment left lower lobe pulmonary nodule is new in the interval and most likely benign, potentially related to prior radiation therapy.  Attention on follow-up recommended. 4. Stable left adrenal adenomas. 5.  Aortic Atherosclerosis (ICD10-I70.0). Electronically Signed   By: Misty Stanley M.D.   On: 07/04/2022 09:47   CT CARDIAC SCORING (SELF PAY ONLY)  Addendum Date: 06/22/2022   ADDENDUM REPORT: 06/22/2022 15:12 EXAM: OVER-READ INTERPRETATION  CT CHEST The following report is an over-read performed by radiologist Dr. [facility] on 06/22/2022. This over-read does not include interpretation of cardiac or coronary anatomy or pathology. The coronary calcium score interpretation by the cardiologist is attached. COMPARISON:  12/28/2021 FINDINGS: Atherosclerotic calcifications aorta without aneurysm. Minimal pericardial effusion. Esophagus unremarkable. No adenopathy. Visualized upper abdomen normal. Bronchitic and interstitial changes particularly at the RIGHT perihilar region again identified. Underlying emphysematous changes. Metallic foreign body posterior RIGHT upper lobe image 1 unchanged. Parenchymal scarring at posterior RIGHT upper lobe and superior segment RIGHT lower lobe unchanged. Tiny nodular focus superior segment RIGHT lower lobe image 7 unchanged. No infiltrate, pleural effusion, or pneumothorax. No acute osseous findings. IMPRESSION: Parenchymal scarring at posterior RIGHT upper lobe and superior segment RIGHT lower lobe unchanged. Tiny nodular focus superior segment RIGHT lower lobe unchanged; no follow-up imaging recommended. No new intrathoracic abnormalities. Aortic Atherosclerosis (ICD10-I70.0) and Emphysema (ICD10-J43.9). Electronically Signed   By: Lavonia Dana M.D.   On: 06/22/2022 15:12   Result Date: 06/22/2022 CLINICAL DATA:  18F for cardiovascular disease risk stratification EXAM: Coronary Calcium Score TECHNIQUE: A gated, non-contrast computed tomography scan of the heart was performed using 32mm slice thickness. Axial images were analyzed on a dedicated workstation. Calcium scoring of the  coronary arteries was performed using the Agatston method. FINDINGS: Coronary arteries: Normal origins. Coronary Calcium Score: Total: 0 Percentile: 1st Pericardium: Normal. Ascending Aorta: Normal caliber.  Aortic atherosclerosis. Non-cardiac: See separate report from [facility]. IMPRESSION: Coronary calcium score of 0. This was 1st percentile for age-, race-, and sex-matched controls. Aortic atherosclerosis. RECOMMENDATIONS: Coronary artery calcium (CAC) score is a strong predictor of incident coronary heart disease (CHD) and provides predictive information beyond traditional risk factors. CAC scoring is reasonable to use in the decision to withhold, postpone, or initiate statin therapy in intermediate-risk or selected borderline-risk asymptomatic adults (age 32-75 years and LDL-C >=70 to <190 mg/dL) who do not have diabetes or established atherosclerotic cardiovascular disease (ASCVD).* In intermediate-risk (10-year ASCVD risk >=7.5% to <20%) adults or selected borderline-risk (10-year ASCVD risk >=5% to <7.5%) adults in whom a CAC score is measured for the purpose of making a treatment decision the following recommendations have been made: If CAC=0, it is reasonable to withhold statin therapy and reassess in 5 to 10 years, as long as higher risk conditions are absent (diabetes mellitus, family history of premature CHD in first degree relatives (males <55 years; females <65 years), cigarette smoking, or LDL >=190 mg/dL). If CAC  is 1 to 44, it is reasonable to initiate statin therapy for patients >=86 years of age. If CAC is >=100 or >=75th percentile, it is reasonable to initiate statin therapy at any age. Cardiology referral should be considered for patients with CAC scores >=400 or >=75th percentile. *2018 AHA/ACC/AACVPR/AAPA/ABC/ACPM/ADA/AGS/APhA/ASPC/NLA/PCNA Guideline on the Management of Blood Cholesterol: A Report of the American College of Cardiology/American Heart Association Task Force on  Clinical Practice Guidelines. J Am Coll Cardiol. 2019;73(24):3168-3209. Skeet Latch, MD Electronically Signed: By: Skeet Latch M.D. On: 06/22/2022 13:30    ASSESSMENT AND PLAN: This is a very pleasant 61 years old white female with limited stage (T1c, N2, M0) small cell lung cancer presented with right upper lobe and suprahilar mass in addition to mediastinal lymphadenopathy diagnosed in February 2022.  The patient underwent systemic chemotherapy with cisplatin 80 mg/M2 on day 1 and etoposide 100 mg/M2 on days 1, 2 and 3 every 3 weeks status post 4 cycles. This was concurrent with radiotherapy followed by prophylactic cranial irradiation. She has been on observation since the completion of the prophylactic cranial irradiation and she is feeling fine except for the peripheral neuropathy. The patient had repeat CT scan of the chest performed recently.  I personally and independently reviewed the scan and discussed the result with the patient today. Her scan showed no concerning findings for disease recurrence or metastasis but there was a tiny 2 mm superior segment left lower lobe pulmonary nodule that is new in the interval and likely benign but I will arrange for the patient to have repeat CT scan of the chest in 3 months for further evaluation and to rule out any disease recurrence or metastasis. The patient was advised to call immediately if she has any other concerning symptoms in the interval. The patient voices understanding of current disease status and treatment options and is in agreement with the current care plan.  All questions were answered. The patient knows to call the clinic with any problems, questions or concerns. We can certainly see the patient much sooner if necessary.  Disclaimer: This note was dictated with voice recognition software. Similar sounding words can inadvertently be transcribed and may not be corrected upon review.

## 2022-07-26 ENCOUNTER — Ambulatory Visit
Admission: RE | Admit: 2022-07-26 | Discharge: 2022-07-26 | Disposition: A | Discharge status: Home / Self Care | Payer: Medicare Other | Source: Ambulatory Visit | Attending: Radiation Oncology | Admitting: Radiation Oncology

## 2022-07-26 DIAGNOSIS — C7931 Secondary malignant neoplasm of brain: Secondary | ICD-10-CM

## 2022-07-26 MED ORDER — GADOPICLENOL 0.5 MMOL/ML IV SOLN
8.0000 mL | Freq: Once | INTRAVENOUS | Status: AC | PRN
  Administered 2022-07-26: 8 mL via INTRAVENOUS

## 2022-07-27 ENCOUNTER — Encounter: Payer: Medicare Other | Attending: Registered Nurse | Admitting: Registered Nurse

## 2022-07-27 ENCOUNTER — Encounter: Payer: Self-pay | Admitting: Radiation Oncology

## 2022-07-27 ENCOUNTER — Encounter: Payer: Self-pay | Admitting: Registered Nurse

## 2022-07-27 VITALS — BP 112/75 | HR 102 | Ht 69.0 in | Wt 181.0 lb

## 2022-07-27 DIAGNOSIS — G894 Chronic pain syndrome: Secondary | ICD-10-CM

## 2022-07-27 DIAGNOSIS — G8929 Other chronic pain: Secondary | ICD-10-CM | POA: Diagnosis not present

## 2022-07-27 DIAGNOSIS — Z79891 Long term (current) use of opiate analgesic: Secondary | ICD-10-CM

## 2022-07-27 DIAGNOSIS — M542 Cervicalgia: Secondary | ICD-10-CM

## 2022-07-27 DIAGNOSIS — Z5181 Encounter for therapeutic drug level monitoring: Secondary | ICD-10-CM | POA: Diagnosis not present

## 2022-07-27 DIAGNOSIS — M5412 Radiculopathy, cervical region: Secondary | ICD-10-CM | POA: Diagnosis not present

## 2022-07-27 DIAGNOSIS — M25511 Pain in right shoulder: Secondary | ICD-10-CM | POA: Insufficient documentation

## 2022-07-27 DIAGNOSIS — M4802 Spinal stenosis, cervical region: Secondary | ICD-10-CM | POA: Diagnosis not present

## 2022-07-27 MED ORDER — ACETAMINOPHEN-CODEINE 300-60 MG PO TABS: 1.0000 | ORAL_TABLET | Freq: Every day | ORAL | 2 refills | Status: DC | PRN

## 2022-07-27 NOTE — Progress Notes (Unsigned)
Subjective:    Patient ID: Susan Cortez, female    DOB: 06-Jan-1961, 61 y.o.   MRN: [id number]  HPI: Susan Cortez is a 61 y.o. female who returns for follow up appointment for chronic pain and medication refill. states *** pain is located in  ***. rates pain ***. current exercise regime is walking and performing stretching exercises.  [name] Morphine equivalent is *** MME.   Last UDS was Performed on 05/01/2022, it was consistent.     Pain Inventory Average Pain 7 Pain Right Now 6 My pain is intermittent, sharp, burning, stabbing, and aching  In the last 24 hours, has pain interfered with the following? General activity 1 Relation with others 0 Enjoyment of life 2 What TIME of day is your pain at its worst? daytime and evening Sleep (in general) Fair  Pain is worse with: walking, standing, and some activites Pain improves with: rest, heat/ice, pacing activities, and medication Relief from Meds: 5  Family History  Problem Relation Age of Onset   Diabetes Father    Heart disease Father    Hyperlipidemia Father    Colon cancer Neg Hx    Colon polyps Neg Hx    Esophageal cancer Neg Hx    Rectal cancer Neg Hx    Stomach cancer Neg Hx    Social History   Socioeconomic History   Marital status: Divorced    Spouse name: Not on file   Number of children: Not on file   Years of education: Not on file   Highest education level: Not on file  Occupational History   Not on file  Tobacco Use   Smoking status: Every Day    Packs/day: 1.00    Years: 45.00    Total pack years: 45.00    Types: Cigarettes   Smokeless tobacco: Never   Tobacco comments:    1 pack smoked per day [facility] 09/01/21  Vaping Use   Vaping Use: Former  Substance and Sexual Activity   Alcohol use: No   Drug use: No   Sexual activity: Not on file    Comment: Tubal Ligation  Other Topics Concern   Not on file  Social History Narrative   Not on file   Social Determinants of Health    Financial Resource Strain: Low Risk  (04/18/2021)   Overall Financial Resource Strain (CARDIA)    Difficulty of Paying Living Expenses: Not hard at all  Food Insecurity: No Food Insecurity (04/18/2021)   Hunger Vital Sign    Worried About Running Out of Food in the Last Year: Never true    Ran Out of Food in the Last Year: Never true  Transportation Needs: No Transportation Needs (04/18/2021)   PRAPARE - Hydrologist (Medical): No    Lack of Transportation (Non-Medical): No  Physical Activity: Inactive (04/18/2021)   Exercise Vital Sign    Days of Exercise per Week: 0 days    Minutes of Exercise per Session: 0 min  Stress: Stress Concern Present (04/18/2021)   [place]    Feeling of Stress : To some extent  Social Connections: Socially Isolated (04/18/2021)   Social Connection and Isolation Panel [NHANES]    Frequency of Communication with Friends and Family: More than three times a week    Frequency of Social Gatherings with Friends and Family: More than three times a week    Attends Religious Services: Never  Active Member of Clubs or Organizations: No    Attends Archivist Meetings: Never    Marital Status: Divorced   Past Surgical History:  Procedure Laterality Date   CESAREAN SECTION     X 3   COLONOSCOPY  2013, 2016, 2019   hx colon polyps   DILATION AND CURETTAGE OF UTERUS     FUDUCIAL PLACEMENT Right 10/18/2020   Procedure: PLACEMENT OF FUDUCIAL;  Surgeon: Collene Gobble, MD;  [facility]: [facility];  Service: Thoracic;  Laterality: Right;   IR IMAGING GUIDED PORT INSERTION  11/29/2020   IR REMOVAL TUN ACCESS W/ PORT W/O FL MOD SED  05/08/2022   SHOULDER ARTHROSCOPY WITH OPEN ROTATOR CUFF REPAIR Right 05/2009   TONSILLECTOMY  1977   TUBAL LIGATION     VIDEO BRONCHOSCOPY WITH ENDOBRONCHIAL NAVIGATION N/A 10/18/2020   Procedure: VIDEO BRONCHOSCOPY WITH ENDOBRONCHIAL  NAVIGATION;  Surgeon: Collene Gobble, MD;  Location: [place];  Service: Thoracic;  Laterality: N/A;   VIDEO BRONCHOSCOPY WITH ENDOBRONCHIAL ULTRASOUND N/A 10/18/2020   Procedure: VIDEO BRONCHOSCOPY WITH ENDOBRONCHIAL ULTRASOUND;  Surgeon: Collene Gobble, MD;  Location: [place];  Service: Thoracic;  Laterality: N/A;   WISDOM TOOTH EXTRACTION     Past Surgical History:  Procedure Laterality Date   CESAREAN SECTION     X 3   COLONOSCOPY  2013, 2016, 2019   hx colon polyps   DILATION AND CURETTAGE OF UTERUS     FUDUCIAL PLACEMENT Right 10/18/2020   Procedure: PLACEMENT OF FUDUCIAL;  Surgeon: Collene Gobble, MD;  [facility];  Service: Thoracic;  Laterality: Right;   IR IMAGING GUIDED PORT INSERTION  11/29/2020   IR REMOVAL TUN ACCESS W/ PORT W/O FL MOD SED  05/08/2022   SHOULDER ARTHROSCOPY WITH OPEN ROTATOR CUFF REPAIR Right 05/2009   TONSILLECTOMY  1977   TUBAL LIGATION     VIDEO BRONCHOSCOPY WITH ENDOBRONCHIAL NAVIGATION N/A 10/18/2020   Procedure: VIDEO BRONCHOSCOPY WITH ENDOBRONCHIAL NAVIGATION;  Surgeon: Collene Gobble, MD;  Location: [place];  Service: Thoracic;  Laterality: N/A;   VIDEO BRONCHOSCOPY WITH ENDOBRONCHIAL ULTRASOUND N/A 10/18/2020   Procedure: VIDEO BRONCHOSCOPY WITH ENDOBRONCHIAL ULTRASOUND;  Surgeon: Collene Gobble, MD;  [facility];  Service: Thoracic;  Laterality: N/A;   WISDOM TOOTH EXTRACTION     Past Medical History:  Diagnosis Date   Allergy    Arthritis    Basal cell carcinoma 2018   Removed from Right neck per patient    Diverticulosis 2013   HLD (hyperlipidemia)    Hx of adenomatous colonic polyps 2013   OAB (overactive bladder)    Pre-diabetes    Small cell lung cancer ([place]) 11/09/2020   BP 112/75   Pulse (!) 102   Ht 5\' 9"  (1.753 m)   Wt 181 lb (82.1 kg)   LMP  (LMP Unknown)   SpO2 96%   BMI 26.73 kg/m   Opioid Risk Score:   Fall Risk Score:  `1  Depression screen [facility] 2/9     06/18/2022    8:28 AM 04/19/2022    1:06 PM 01/24/2022    10:47 AM 10/25/2021   10:27 AM 07/12/2021   10:17 AM 06/14/2021    8:24 AM 04/18/2021   11:05 AM  Depression screen PHQ 2/9  Decreased Interest 0 0 0 0 0 0 0  Down, Depressed, Hopeless 0 0 0 0 0 0 0  PHQ - 2 Score 0 0 0 0 0 0 0  Review of Systems  Musculoskeletal:  Positive for back pain.       Right shoulder pain  All other systems reviewed and are negative.      Objective:   Physical Exam        Assessment & Plan:  1.Cervical spinal stenosis without evidence of radiculopathy/ Cervical Radiculitis . Chronic neck pain: Refilled Tylenol #4  300/60mg  daily, #30. 05/01/2022. We will continue the opioid monitoring program, this consists of regular clinic visits, examinations, urine drug screen, pill counts as well as use of [place] Controlled Substance Reporting system. A 12 month History has been reviewed on the [place] on 05/01/2022. 2. Right shoulder rotator cuff tear, status post repair with chronic pain and contracture : Continue with exercise regime as tolerated .05/01/2022. 3. Insomnia: Continue Melatonin. 05/01/2022 4. Chronic Bilateral Lower Back Pain without Sciatica: No complaints today. Continue HEP as tolerated. Continue to Monitor. 05/01/2021  5. Right Greater Trochanter Bursitis: No complaints Today. Continue to Alternate Ice and Heat Therapy. Continue to Monitor 05/01/2022 6. Chronic right Sided Thoracic Back Pain: Continue current medication regime. Continue HEP as tolerated. Continue to monitor. 05/01/2022  7. Right Knee Pain: RX: X-ray    F/U in 3 months

## 2022-07-30 ENCOUNTER — Encounter: Payer: Self-pay | Admitting: Radiation Oncology

## 2022-07-30 ENCOUNTER — Inpatient Hospital Stay: Payer: Medicare Other | Attending: Radiation Oncology

## 2022-07-30 ENCOUNTER — Ambulatory Visit
Admission: RE | Admit: 2022-07-30 | Discharge: 2022-07-30 | Disposition: A | Discharge status: Home / Self Care | Payer: Medicare Other | Source: Ambulatory Visit | Attending: Radiation Oncology | Admitting: Radiation Oncology

## 2022-07-30 DIAGNOSIS — C3491 Malignant neoplasm of unspecified part of right bronchus or lung: Secondary | ICD-10-CM

## 2022-07-30 DIAGNOSIS — C3411 Malignant neoplasm of upper lobe, right bronchus or lung: Secondary | ICD-10-CM | POA: Diagnosis not present

## 2022-07-30 DIAGNOSIS — F17210 Nicotine dependence, cigarettes, uncomplicated: Secondary | ICD-10-CM | POA: Diagnosis not present

## 2022-07-30 NOTE — Progress Notes (Signed)
Telephone nursing appointment to receive most recent MRI results from 07/26/2022 per Shona Simpson PA-C. I verified patient's identity and began nursing interview.   Patient reports occasional, short term headaches 2/10, well managed. No other issues reported at this time.   Meaningful use complete.   Patient aware of their 2:30pm-07/30/2022 telephone appointment w/ Shona Simpson PA-C. I left my extension [phone] in case patient needs anything. Patient verbalized understanding. This concludes the nursing interview.   Patient contact [phone]     Leandra Kern, LPN

## 2022-07-30 NOTE — Progress Notes (Signed)
Radiation Oncology         [phone]) [phone] ________________________________  Outpatient Follow Up - Conducted via telephone at patient request.  I spoke with the patient to conduct this consult visit via telephone. The patient was notified in advance and was offered an in person or telemedicine meeting to allow for face to face communication but instead preferred to proceed with a telephone visit. ________________________________  Name: Susan Cortez        MRN: [id number]  Date of Service: 07/30/2022 DOB: [birth year]  PJ:ASNKNLZ, Gay Filler, MD  Copland, Gay Filler, MD     REFERRING PHYSICIAN: Copland, Gay Filler, MD   DIAGNOSIS: The encounter diagnosis was Small cell carcinoma of right lung ([place]).   HISTORY OF PRESENT ILLNESS: Susan Cortez is a 61 y.o. female with a history of limited stage small cell carcinoma of the right upper lobe involving suprahilar nodule/adenopathy and right paractracheal adenopathy. A biopsy and cytology on 10/18/20 showed malignant cells in the right upper lobe brushings and fine-needle aspirate, atypical cells were seen in the 11 R node by fine-needle aspiration and malignant cells consistent with small cell carcinoma were seen in the level 4R and 10 R nodes as well.  She received chemoradiation which she completed on  01/04/21.   She was counseled on prophylactic cranial irradiation and prior to treatment had a negative MRI of the brain. She did have a lesion in the calvarium that was originally read as possibly metastatic, but after further discussion with neuroradiology, this was felt to be a benign appearing hemangioma. She went on to proceed with whole brain radiation with Namenda. She completed radiation to the brain on 03/28/21. She completed Namenda in January 2023. She continues in surveillance with Dr. Julien Nordmann.  Her most recent MRI of the brain on 07/26/2022 showed stable findings in the calvarium consistent with known hemangioma and no evidence of intracranial  metastatic disease.  She is contacted today to discuss these results.    REVIEW OF SYSTEMS: On review of systems the patient reports she is doing well overall.  She reports occasional headaches especially at night time and she feels these are related to stress. No other complaints however of blurred vision, double vision, speech or movement changes are noted.  She states that she is not having any trouble with breathing or chest pain.  No other complaints are verbalized.    PREVIOUS RADIATION THERAPY:   03/15/2021 through 03/28/2021 Site Technique Total Dose (Gy) Dose per Fx (Gy) Completed Fx Beam Energies  Brain:  Prophylactic Whole Brain Complex 25/25 2.5 10/10 6X   11/21/2020 through 01/04/2021 Site Technique Total Dose (Gy) Dose per Fx (Gy) Completed Fx Beam Energies  Lung, Right: Lung_Rt 3D 60/60 2 30/30 6X, 10X  Lung, Right: Lung_Rt_Bst 3D 6/6 2 3/3 6X, 10X    PAST MEDICAL HISTORY:  Past Medical History:  Diagnosis Date   Allergy    Arthritis    Basal cell carcinoma 2018   Removed from Right neck per patient    Diverticulosis 2013   HLD (hyperlipidemia)    Hx of adenomatous colonic polyps 2013   OAB (overactive bladder)    Pre-diabetes    Small cell lung cancer ([place]) 11/09/2020       PAST SURGICAL HISTORY: Past Surgical History:  Procedure Laterality Date   CESAREAN SECTION     X 3   COLONOSCOPY  2013, 2016, 2019   hx colon polyps   DILATION AND CURETTAGE OF UTERUS  FUDUCIAL PLACEMENT Right 10/18/2020   Procedure: PLACEMENT OF FUDUCIAL;  Surgeon: Collene Gobble, MD;  [facility];  Service: Thoracic;  Laterality: Right;   IR IMAGING GUIDED PORT INSERTION  11/29/2020   IR REMOVAL TUN ACCESS W/ PORT W/O FL MOD SED  05/08/2022   SHOULDER ARTHROSCOPY WITH OPEN ROTATOR CUFF REPAIR Right 05/2009   TONSILLECTOMY  1977   TUBAL LIGATION     VIDEO BRONCHOSCOPY WITH ENDOBRONCHIAL NAVIGATION N/A 10/18/2020   Procedure: VIDEO BRONCHOSCOPY WITH ENDOBRONCHIAL NAVIGATION;   Surgeon: Collene Gobble, MD;  [facility];  Service: Thoracic;  Laterality: N/A;   VIDEO BRONCHOSCOPY WITH ENDOBRONCHIAL ULTRASOUND N/A 10/18/2020   Procedure: VIDEO BRONCHOSCOPY WITH ENDOBRONCHIAL ULTRASOUND;  Surgeon: Collene Gobble, MD;  [facility];  Service: Thoracic;  Laterality: N/A;   WISDOM TOOTH EXTRACTION       FAMILY HISTORY:  Family History  Problem Relation Age of Onset   Diabetes Father    Heart disease Father    Hyperlipidemia Father    Colon cancer Neg Hx    Colon polyps Neg Hx    Esophageal cancer Neg Hx    Rectal cancer Neg Hx    Stomach cancer Neg Hx      SOCIAL HISTORY:  reports that she has been smoking cigarettes. She has a 45.00 pack-year smoking history. She has never used smokeless tobacco. She reports that she does not drink alcohol and does not use drugs. The patient is divorced and lives in [place]. She is originally from [place] and enjoys traveling home to [place] periodically to see friends and family.     ALLERGIES: Lyrica [pregabalin], Butrans [buprenorphine], Chantix [varenicline], Diclofenac, Doxycycline, Meloxicam, Tolmetin, and Nsaids   MEDICATIONS:  Current Outpatient Medications  Medication Sig Dispense Refill   acetaminophen-codeine (TYLENOL #4) 300-60 MG tablet Take 1 tablet by mouth daily as needed for moderate pain. 30 tablet 2   CALCIUM PO Take 650 mg by mouth daily.     Cholecalciferol (VITAMIN D3) 125 MCG (5000 UT) CAPS Take 5,000 Units by mouth daily.     Cyanocobalamin (B-12) 100 MCG TABS      gabapentin (NEURONTIN) 100 MG capsule TAKE 1 CAPSULE BY MOUTH TWICE A DAY 60 capsule 1   lidocaine-prilocaine (EMLA) cream APPLY 1 APPLICATION TOPICALLY AS NEEDED. 30 g 0   LORazepam (ATIVAN) 1 MG tablet TAKE 0.5 TABLET BY MOUTH EVERY 8 HOURS. TAKE 1 TABLET 30 MINUTES PRIOR TO MRI OR RADIATION 14 tablet 0   lovastatin (MEVACOR) 20 MG tablet TAKE 1 TABLET BY MOUTH EVERYDAY AT BEDTIME 90 tablet 1   metoprolol succinate  (TOPROL-XL) 25 MG 24 hr tablet TAKE 1/2 TABLET BY MOUTH EVERY DAY 45 tablet 3   mirabegron ER (MYRBETRIQ) 50 MG TB24 tablet Take 1 tablet (50 mg total) by mouth daily. 90 tablet 3   Multiple Vitamins-Minerals (MULTIVITAMIN ADULT, MINERALS,) TABS      No current facility-administered medications for this encounter.        PHYSICAL EXAM:  Unable to assess  due to encounter type.    ECOG = 1  0 - Asymptomatic (Fully active, able to carry on all predisease activities without restriction)  1 - Symptomatic but completely ambulatory (Restricted in physically strenuous activity but ambulatory and able to carry out work of a light or sedentary nature. For example, light housework, office work)  2 - Symptomatic, <50% in bed during the day (Ambulatory and capable of all self care but unable to  carry out any work activities. Up and about more than 50% of waking hours)  3 - Symptomatic, >50% in bed, but not bedbound (Capable of only limited self-care, confined to bed or chair 50% or more of waking hours)  4 - Bedbound (Completely disabled. Cannot carry on any self-care. Totally confined to bed or chair)  5 - Death   Eustace Pen MM, Creech RH, Tormey DC, et al. [phone]). "Toxicity and response criteria of the [facility]". [place] Oncol. 5 (6): 649-55    LABORATORY DATA:  Lab Results  Component Value Date   WBC 8.7 07/02/2022   HGB 13.9 07/02/2022   HCT 41.3 07/02/2022   MCV 95.2 07/02/2022   PLT 324 07/02/2022   Lab Results  Component Value Date   NA 138 07/02/2022   K 4.5 07/02/2022   CL 103 07/02/2022   CO2 29 07/02/2022   Lab Results  Component Value Date   ALT 21 07/02/2022   AST 20 07/02/2022   ALKPHOS 53 07/02/2022   BILITOT 0.5 07/02/2022      RADIOGRAPHY: MR Brain W Wo Contrast  Result Date: 07/27/2022 CLINICAL DATA:  Brain metastases, staging. EXAM: MRI HEAD WITHOUT AND WITH CONTRAST TECHNIQUE: Multiplanar, multiecho pulse sequences of the  brain and surrounding structures were obtained without and with intravenous contrast. CONTRAST:  8 cc Vueway COMPARISON:  Brain MRI 01/26/2019 FINDINGS: Brain: There is no acute intracranial hemorrhage, extra-axial fluid collection, or acute infarct Parenchymal volume is normal. The ventricles are normal in size. Gray-white differentiation is preserved. Few small areas of FLAIR signal abnormality in the supratentorial white matter are unchanged, nonspecific. 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 There is no suspicious parenchymal signal abnormality or abnormal enhancement. There is no mass lesion. There is no mass effect or midline shift. Vascular: Normal flow voids. Skull and upper cervical spine: The enhancing left parietal calvarial lesion with DWI signal abnormality is unchanged. A focus of T1 hypointensity and T2 hyperintensity in the C2 vertebral body is unchanged. There is no new suspicious osseous lesion. Sinuses/Orbits: The paranasal sinuses are clear. The globes and orbits are unremarkable. Other: None. IMPRESSION: 1. No evidence of intracranial metastatic disease. 2. Stable left parietal calvarial lesion. Electronically Signed   By: Valetta Mole M.D.   On: 07/27/2022 08:52   CT Chest W Contrast  Result Date: 07/04/2022 CLINICAL DATA:  Small-cell lung cancer. Restaging. * Tracking Code: BO * EXAM: CT CHEST WITH CONTRAST TECHNIQUE: Multidetector CT imaging of the chest was performed during intravenous contrast administration. RADIATION DOSE REDUCTION: This exam was performed according to the departmental dose-optimization program which includes automated exposure control, adjustment of the mA and/or kV according to patient size and/or use of iterative reconstruction technique. CONTRAST:  25mL OMNIPAQUE IOHEXOL 300 MG/ML  SOLN COMPARISON:  12/28/2021 FINDINGS: Cardiovascular: The heart size is normal. No substantial pericardial effusion. Mild atherosclerotic  calcification is noted in the wall of the thoracic aorta. Mediastinum/Nodes: No mediastinal lymphadenopathy. There is no hilar lymphadenopathy. The esophagus has normal imaging features. 12 mm right thyroid nodule evident. This has been evaluated on previous imaging. (ref: J Am Coll Radiol. 2015 Feb;12(2): 143-50).There is no axillary lymphadenopathy. Lungs/Pleura: Centrilobular emphsyema noted. Chronic interstitial and alveolar changes in the suprahilar and parahilar right lung is compatible with sequelae of radiation therapy. Subtle areas of minimally irregular pleural thickening along the major fissure (57/7) are similar to prior. Tiny superior segment left lower lobe nodule medially on 47/7 is  new in the interval measuring about 2 mm. No new suspicious pulmonary nodule or mass in the left lung. No pleural effusion. Upper Abdomen: 2 left adrenal gland nodules are stable. These are non hypermetabolic and characterized as adenomas on PET-CT 11/08/2020. No followup imaging is recommended. Musculoskeletal: No worrisome lytic or sclerotic osseous abnormality. IMPRESSION: 1. Stable exam. No new or progressive findings to suggest recurrent or metastatic disease. 2. Chronic interstitial and alveolar changes in the suprahilar and parahilar right lung is compatible with sequelae of radiation therapy. 3. Tiny 2 mm superior segment left lower lobe pulmonary nodule is new in the interval and most likely benign, potentially related to prior radiation therapy. Attention on follow-up recommended. 4. Stable left adrenal adenomas. 5.  Aortic Atherosclerosis (ICD10-I70.0). Electronically Signed   By: Misty Stanley M.D.   On: 07/04/2022 09:47        IMPRESSION/PLAN: 1. Limited Stage Small Cell Carcinoma of the RUL.  The patient is doing very well clinically, and her recent CT shows she is doing well systemically. Her MRI was reviewed and she is motivated to continue at a 6 month schedule for surveillance. She will also follow  up with Dr. Worthy Flank team in surveillance.  2. Left Parietal Calvarial Hemangioma.  Again this has remained stable on MRI and in discussion of brain oncology conference has been felt to be hemangioma rather than any concerns for malignancy.   This encounter was conducted via telephone.  The patient has provided two factor identification and has given verbal consent for this type of encounter and has been advised to only accept a meeting of this type in a secure network environment. The time spent during this encounter was 30 minutes including preparation, discussion, and coordination of the patient's care. The attendants for this meeting include  Hayden Pedro  and [name].  During the encounter,  Hayden Pedro was located at [facility] Radiation Oncology Department.  Susan Cortez was located at home.        Carola Rhine, [facility]   **Disclaimer: This note was dictated with voice recognition software. Similar sounding words can inadvertently be transcribed and this note may contain transcription errors which may not have been corrected upon publication of note.**

## 2022-08-01 ENCOUNTER — Ambulatory Visit (HOSPITAL_BASED_OUTPATIENT_CLINIC_OR_DEPARTMENT_OTHER)
Admission: RE | Admit: 2022-08-01 | Discharge: 2022-08-01 | Disposition: A | Discharge status: Home / Self Care | Payer: Medicare Other | Source: Ambulatory Visit | Attending: Registered Nurse | Admitting: Registered Nurse

## 2022-08-01 DIAGNOSIS — M5412 Radiculopathy, cervical region: Secondary | ICD-10-CM | POA: Diagnosis not present

## 2022-08-01 DIAGNOSIS — M4312 Spondylolisthesis, cervical region: Secondary | ICD-10-CM | POA: Diagnosis not present

## 2022-08-01 DIAGNOSIS — M542 Cervicalgia: Secondary | ICD-10-CM | POA: Insufficient documentation

## 2022-08-10 ENCOUNTER — Telehealth: Payer: Self-pay | Admitting: Registered Nurse

## 2022-08-10 DIAGNOSIS — M5412 Radiculopathy, cervical region: Secondary | ICD-10-CM

## 2022-08-10 DIAGNOSIS — M542 Cervicalgia: Secondary | ICD-10-CM

## 2022-08-10 NOTE — Telephone Encounter (Signed)
Reviewed Ms. [facility] Cervical  Spine X-Ray with Dr Letta Pate.  Cervical MRI : Ordered today.  Call placed to Susan Cortez regarding the above, she is in agreement and verbalizes understanding.

## 2022-08-13 ENCOUNTER — Inpatient Hospital Stay: Payer: Medicare Other

## 2022-08-19 ENCOUNTER — Other Ambulatory Visit: Payer: Self-pay | Admitting: Family Medicine

## 2022-08-19 DIAGNOSIS — E785 Hyperlipidemia, unspecified: Secondary | ICD-10-CM

## 2022-09-02 ENCOUNTER — Encounter: Payer: Self-pay | Admitting: Internal Medicine

## 2022-09-06 ENCOUNTER — Telehealth: Payer: Self-pay | Admitting: Medical Oncology

## 2022-09-06 NOTE — Telephone Encounter (Signed)
Port removed several months ago. The area to the right of the port site has swelling and warm . Denies redness. I instructed her to contact PCP .

## 2022-09-07 ENCOUNTER — Ambulatory Visit (INDEPENDENT_AMBULATORY_CARE_PROVIDER_SITE_OTHER): Payer: 59 | Admitting: Family

## 2022-09-07 ENCOUNTER — Encounter: Payer: Self-pay | Admitting: Family

## 2022-09-07 VITALS — BP 118/72 | HR 100 | Temp 98.0°F | Ht 69.0 in | Wt 178.8 lb

## 2022-09-07 DIAGNOSIS — R229 Localized swelling, mass and lump, unspecified: Secondary | ICD-10-CM

## 2022-09-07 DIAGNOSIS — J309 Allergic rhinitis, unspecified: Secondary | ICD-10-CM | POA: Diagnosis not present

## 2022-09-07 MED ORDER — CEPHALEXIN 500 MG PO CAPS: 500.0000 mg | ORAL_CAPSULE | Freq: Three times a day (TID) | ORAL | 0 refills | Status: AC

## 2022-09-07 MED ORDER — FLUTICASONE PROPIONATE 50 MCG/ACT NA SUSP: 2.0000 | Freq: Every day | NASAL | 6 refills | Status: DC

## 2022-09-07 NOTE — Progress Notes (Signed)
Susan Cortez is a 62 y.o. female with the following history as recorded in Epic[employer]:  Patient Active Problem List   Diagnosis Date Noted   Tobacco use 06/01/2021   Tachycardia 05/29/2021   Cough 03/28/2021   Port-A-Cath in place 01/02/2021   Chemotherapy induced neutropenia ([place]) 01/02/2021   COPD, mild ([place]) 12/19/2020   Encounter for antineoplastic chemotherapy 12/05/2020   Goals of care, counseling/discussion 11/09/2020   Small cell carcinoma of right lung ([place]) 11/09/2020   Mediastinal adenopathy 10/18/2020   Pulmonary nodule 1 cm or greater in diameter 06/28/2020   Encounter for screening for lung cancer 01/04/2020   Porokeratosis 12/02/2019   Plantar flexed metatarsal bone of left foot 12/02/2019   Plantar flexed metatarsal bone of right foot 12/02/2019   Prediabetes 05/31/2019   Dyslipidemia 03/01/2018   De Quervain's disease (tenosynovitis) 03/28/2017   Chronic bilateral low back pain without sciatica 03/28/2017   Sebaceous hyperplasia of face 06/26/2016   Chronic right shoulder pain 01/02/2016   Adenoma of left adrenal gland 12/26/2015   Intermittent claudication ([place]) 12/26/2015   Rotator cuff (capsule) sprain 03/26/2014   Spinal stenosis in cervical region 03/26/2014   Chronic pain 03/26/2014    Current Outpatient Medications  Medication Sig Dispense Refill   cephALEXin (KEFLEX) 500 MG capsule Take 1 capsule (500 mg total) by mouth 3 (three) times daily for 7 days. 21 capsule 0   fluticasone (FLONASE) 50 MCG/ACT nasal spray Place 2 sprays into both nostrils daily. 16 g 6   acetaminophen-codeine (TYLENOL #4) 300-60 MG tablet Take 1 tablet by mouth daily as needed for moderate pain. 30 tablet 2   CALCIUM PO Take 650 mg by mouth daily.     Cholecalciferol (VITAMIN D3) 125 MCG (5000 UT) CAPS Take 5,000 Units by mouth daily.     Cyanocobalamin (B-12) 100 MCG TABS      gabapentin (NEURONTIN) 100 MG capsule TAKE 1 CAPSULE BY MOUTH TWICE A DAY (Patient not taking:  Reported on 09/07/2022) 60 capsule 1   lidocaine-prilocaine (EMLA) cream APPLY 1 APPLICATION TOPICALLY AS NEEDED. 30 g 0   LORazepam (ATIVAN) 1 MG tablet TAKE 0.5 TABLET BY MOUTH EVERY 8 HOURS. TAKE 1 TABLET 30 MINUTES PRIOR TO MRI OR RADIATION 14 tablet 0   lovastatin (MEVACOR) 20 MG tablet TAKE 1 TABLET BY MOUTH EVERYDAY AT BEDTIME 90 tablet 1   metoprolol succinate (TOPROL-XL) 25 MG 24 hr tablet TAKE 1/2 TABLET BY MOUTH EVERY DAY 45 tablet 3   Multiple Vitamins-Minerals (MULTIVITAMIN ADULT, MINERALS,) TABS      No current facility-administered medications for this visit.    Allergies: Lyrica [pregabalin], Butrans [buprenorphine], Chantix [varenicline], Diclofenac, Doxycycline, Meloxicam, Tolmetin, and Nsaids  Past Medical History:  Diagnosis Date   Allergy    Arthritis    Basal cell carcinoma 2018   Removed from Right neck per patient    Diverticulosis 2013   HLD (hyperlipidemia)    Hx of adenomatous colonic polyps 2013   OAB (overactive bladder)    Pre-diabetes    Small cell lung cancer ([place]) 11/09/2020    Past Surgical History:  Procedure Laterality Date   CESAREAN SECTION     X 3   COLONOSCOPY  2013, 2016, 2019   hx colon polyps   DILATION AND CURETTAGE OF UTERUS     FUDUCIAL PLACEMENT Right 10/18/2020   Procedure: PLACEMENT OF FUDUCIAL;  Surgeon: Collene Gobble, MD;  Location: [facility];  Service: Thoracic;  Laterality: Right;   IR IMAGING GUIDED  PORT INSERTION  11/29/2020   IR REMOVAL TUN ACCESS W/ PORT W/O FL MOD SED  05/08/2022   SHOULDER ARTHROSCOPY WITH OPEN ROTATOR CUFF REPAIR Right 05/2009   TONSILLECTOMY  1977   TUBAL LIGATION     VIDEO BRONCHOSCOPY WITH ENDOBRONCHIAL NAVIGATION N/A 10/18/2020   Procedure: VIDEO BRONCHOSCOPY WITH ENDOBRONCHIAL NAVIGATION;  Surgeon: Leslye Peer, MD;  Location: [facility];  Service: Thoracic;  Laterality: N/A;   VIDEO BRONCHOSCOPY WITH ENDOBRONCHIAL ULTRASOUND N/A 10/18/2020   Procedure: VIDEO BRONCHOSCOPY WITH ENDOBRONCHIAL ULTRASOUND;   Surgeon: Leslye Peer, MD;  Location: [facility];  Service: Thoracic;  Laterality: N/A;   WISDOM TOOTH EXTRACTION      Family History  Problem Relation Age of Onset   Diabetes Father    Heart disease Father    Hyperlipidemia Father    Colon cancer Neg Hx    Colon polyps Neg Hx    Esophageal cancer Neg Hx    Rectal cancer Neg Hx    Stomach cancer Neg Hx     Social History   Tobacco Use   Smoking status: Every Day    Packs/day: 1.00    Years: 45.00    Total pack years: 45.00    Types: Cigarettes   Smokeless tobacco: Never   Tobacco comments:    1 pack smoked per day [facility] 09/01/21  Substance Use Topics   Alcohol use: No    Subjective:   Subjective concern for swelling on right side of chest; per patient, area of concern is where patient previously had a port- this was removed several months ago however and her oncologist recommended that she follow up with her PCP;  Upon further discussion, patient notes that the area of concern has actually been swollen for at least the past month; she is due for a chest CT in about a month as part of management with her oncologist;    Objective:  Vitals:   09/07/22 0923  BP: 118/72  Pulse: 100  Temp: 98 F (36.7 C)  TempSrc: Oral  SpO2: 99%  Weight: 178 lb 12.8 oz (81.1 kg)  Height: 5\' 9"  (1.753 m)    General: Well developed, well nourished, in no acute distress  Skin : Warm and dry. Area of scarring noted where port was placed; there is no redness noted on chest and there is a localized area of swelling lateral to the place where the port scar is- the swelling is not over the port scar;  Head: Normocephalic and atraumatic  Eyes: Sclera and conjunctiva clear; pupils round and reactive to light; extraocular movements intact  Ears: External normal; canals clear; tympanic membranes normal  Oropharynx: Pink, supple. No suspicious lesions  Neck: Supple without thyromegaly, adenopathy  Lungs: Respirations unlabored; Neurologic: Alert and  oriented; speech intact; face symmetrical; moves all extremities well; CNII-XII intact without focal deficit   Assessment:  1. Localized skin mass, lump, or swelling   2. Allergic rhinitis, unspecified seasonality, unspecified trigger     Plan:  The area of concern on her chest has been present for at least the past month; patient also mentions that she has noticed increased swelling around her nose- swelling noted on both side of the nose; will go ahead and treat for possible skin infection but am suspicious other source causing symptoms; will reach out to her oncologist to discuss further treatment plan and whether they want to see her in follow up sooner than the appointment scheduled for next month;  2.  Trial of Flonase NS; Did encourage patient to quit smoking as well;   No follow-ups on file.  No orders of the defined types were placed in this encounter.   Requested Prescriptions   Signed Prescriptions Disp Refills   cephALEXin (KEFLEX) 500 MG capsule 21 capsule 0    Sig: Take 1 capsule (500 mg total) by mouth 3 (three) times daily for 7 days.   fluticasone (FLONASE) 50 MCG/ACT nasal spray 16 g 6    Sig: Place 2 sprays into both nostrils daily.

## 2022-09-09 ENCOUNTER — Ambulatory Visit
Admission: RE | Admit: 2022-09-09 | Discharge: 2022-09-09 | Disposition: A | Discharge status: Home / Self Care | Payer: 59 | Source: Ambulatory Visit | Attending: Registered Nurse | Admitting: Registered Nurse

## 2022-09-09 DIAGNOSIS — M47812 Spondylosis without myelopathy or radiculopathy, cervical region: Secondary | ICD-10-CM | POA: Diagnosis not present

## 2022-09-09 DIAGNOSIS — M542 Cervicalgia: Secondary | ICD-10-CM | POA: Diagnosis not present

## 2022-09-09 DIAGNOSIS — M4802 Spinal stenosis, cervical region: Secondary | ICD-10-CM | POA: Diagnosis not present

## 2022-09-10 ENCOUNTER — Encounter: Payer: Self-pay | Admitting: Medical Oncology

## 2022-09-11 ENCOUNTER — Telehealth: Payer: Self-pay | Admitting: Registered Nurse

## 2022-09-11 NOTE — Telephone Encounter (Signed)
Reviewed Susan Cortez MRI results with Dr Wynn Banker. Call placed to Susan Cortez, we reviewed MRI results and she will be scheduled with Dr Wynn Banker for F/U appointment, she verbalizes understanding.

## 2022-09-12 ENCOUNTER — Encounter: Payer: Self-pay | Admitting: Internal Medicine

## 2022-09-17 ENCOUNTER — Ambulatory Visit (HOSPITAL_COMMUNITY)
Admission: RE | Admit: 2022-09-17 | Discharge: 2022-09-17 | Disposition: A | Discharge status: Home / Self Care | Payer: 59 | Source: Ambulatory Visit | Attending: Internal Medicine | Admitting: Internal Medicine

## 2022-09-17 ENCOUNTER — Inpatient Hospital Stay: Payer: 59 | Attending: Radiation Oncology

## 2022-09-17 ENCOUNTER — Other Ambulatory Visit: Payer: Self-pay

## 2022-09-17 ENCOUNTER — Encounter (HOSPITAL_COMMUNITY): Payer: Self-pay

## 2022-09-17 DIAGNOSIS — C3411 Malignant neoplasm of upper lobe, right bronchus or lung: Secondary | ICD-10-CM | POA: Diagnosis not present

## 2022-09-17 DIAGNOSIS — Z85828 Personal history of other malignant neoplasm of skin: Secondary | ICD-10-CM | POA: Insufficient documentation

## 2022-09-17 DIAGNOSIS — C3490 Malignant neoplasm of unspecified part of unspecified bronchus or lung: Secondary | ICD-10-CM

## 2022-09-17 DIAGNOSIS — I700 Atherosclerosis of aorta: Secondary | ICD-10-CM | POA: Diagnosis not present

## 2022-09-17 DIAGNOSIS — J479 Bronchiectasis, uncomplicated: Secondary | ICD-10-CM | POA: Diagnosis not present

## 2022-09-17 DIAGNOSIS — J432 Centrilobular emphysema: Secondary | ICD-10-CM | POA: Insufficient documentation

## 2022-09-17 DIAGNOSIS — Z79899 Other long term (current) drug therapy: Secondary | ICD-10-CM | POA: Insufficient documentation

## 2022-09-17 DIAGNOSIS — Z923 Personal history of irradiation: Secondary | ICD-10-CM | POA: Insufficient documentation

## 2022-09-17 DIAGNOSIS — Z9221 Personal history of antineoplastic chemotherapy: Secondary | ICD-10-CM | POA: Insufficient documentation

## 2022-09-17 DIAGNOSIS — E785 Hyperlipidemia, unspecified: Secondary | ICD-10-CM | POA: Insufficient documentation

## 2022-09-17 DIAGNOSIS — Z86010 Personal history of colonic polyps: Secondary | ICD-10-CM | POA: Diagnosis not present

## 2022-09-17 LAB — CBC WITH DIFFERENTIAL (CANCER CENTER ONLY)
Abs Immature Granulocytes: 0.02 10*3/uL (ref 0.00–0.07)
Basophils Absolute: 0.1 10*3/uL (ref 0.0–0.1)
Basophils Relative: 1 %
Eosinophils Absolute: 0.1 10*3/uL (ref 0.0–0.5)
Eosinophils Relative: 2 %
HCT: 43.4 % (ref 36.0–46.0)
Hemoglobin: 14.7 g/dL (ref 12.0–15.0)
Immature Granulocytes: 0 %
Lymphocytes Relative: 25 %
Lymphs Abs: 2 10*3/uL (ref 0.7–4.0)
MCH: 31.5 pg (ref 26.0–34.0)
MCHC: 33.9 g/dL (ref 30.0–36.0)
MCV: 92.9 fL (ref 80.0–100.0)
Monocytes Absolute: 0.6 10*3/uL (ref 0.1–1.0)
Monocytes Relative: 8 %
Neutro Abs: 5.1 10*3/uL (ref 1.7–7.7)
Neutrophils Relative %: 64 %
Platelet Count: 176 10*3/uL (ref 150–400)
RBC: 4.67 MIL/uL (ref 3.87–5.11)
RDW: 13.7 % (ref 11.5–15.5)
WBC Count: 7.9 10*3/uL (ref 4.0–10.5)
nRBC: 0 % (ref 0.0–0.2)

## 2022-09-17 LAB — CMP (CANCER CENTER ONLY)
ALT: 22 U/L (ref 0–44)
AST: 22 U/L (ref 15–41)
Albumin: 4.2 g/dL (ref 3.5–5.0)
Alkaline Phosphatase: 50 U/L (ref 38–126)
Anion gap: 7 (ref 5–15)
BUN: 9 mg/dL (ref 8–23)
CO2: 27 mmol/L (ref 22–32)
Calcium: 9.8 mg/dL (ref 8.9–10.3)
Chloride: 103 mmol/L (ref 98–111)
Creatinine: 0.91 mg/dL (ref 0.44–1.00)
GFR, Estimated: >60 mL/min (ref >60)
Glucose, Bld: 95 mg/dL (ref 70–99)
Potassium: 3.8 mmol/L (ref 3.5–5.1)
Sodium: 137 mmol/L (ref 135–145)
Total Bilirubin: 0.5 mg/dL (ref 0.3–1.2)
Total Protein: 6.9 g/dL (ref 6.5–8.1)

## 2022-09-17 MED ORDER — SODIUM CHLORIDE (PF) 0.9 % IJ SOLN
INTRAMUSCULAR | Status: AC
  Filled 2022-09-17: qty 50

## 2022-09-17 MED ORDER — IOHEXOL 300 MG/ML  SOLN
75.0000 mL | Freq: Once | INTRAMUSCULAR | Status: AC | PRN
  Administered 2022-09-17: 75 mL via INTRAVENOUS

## 2022-09-19 ENCOUNTER — Encounter: Payer: Self-pay | Admitting: Emergency Medicine

## 2022-09-19 ENCOUNTER — Ambulatory Visit (INDEPENDENT_AMBULATORY_CARE_PROVIDER_SITE_OTHER): Payer: 59 | Admitting: Emergency Medicine

## 2022-09-19 VITALS — BP 124/72 | HR 109 | Temp 97.6°F | Ht 68.5 in | Wt 180.8 lb

## 2022-09-19 DIAGNOSIS — C3491 Malignant neoplasm of unspecified part of right bronchus or lung: Secondary | ICD-10-CM | POA: Diagnosis not present

## 2022-09-19 DIAGNOSIS — J449 Chronic obstructive pulmonary disease, unspecified: Secondary | ICD-10-CM

## 2022-09-19 DIAGNOSIS — R053 Chronic cough: Secondary | ICD-10-CM

## 2022-09-19 MED ORDER — ESOMEPRAZOLE MAGNESIUM 40 MG PO CPDR: 40.0000 mg | DELAYED_RELEASE_CAPSULE | Freq: Every day | ORAL | 11 refills | Status: DC

## 2022-09-19 NOTE — Addendum Note (Signed)
Addended by: Gavin Potters R on: 09/19/2022 09:01 AM   Modules accepted: Orders

## 2022-09-19 NOTE — Patient Instructions (Signed)
We reviewed your CT scan of the chest today.  Agree that you should be able to go back to getting her surveillance CT scans every 6 months.  Please discuss with Dr. Julien Nordmann as well. Continue fluticasone nasal spray, 2 sprays each nostril once daily. Try restarting Nexium 40 mg once daily.  Take this 1 hour around food.  Take for at least 1 month and keep track of whether this helps your reflux and cough.  If so you could continue it going forward. We will hold off on any inhaled medication for now We discussed smoking.  We set a goal for you to cut down to 15 cigarettes daily. Follow Dr. Lamonte Sakai in 1 year or sooner if you have any problems.

## 2022-09-19 NOTE — Assessment & Plan Note (Signed)
More active lately.  She has a lot of saliva, question whether she is also having some rhinitis.  Also question some active GERD.  She just restarted fluticasone nasal spray and we will add PPI to do a 1 month trial to see if she gets benefit.

## 2022-09-19 NOTE — Progress Notes (Signed)
Subjective:    Patient ID: Susan Cortez, female    DOB: 28-Feb-1961, 62 y.o.   MRN: [id number]  HPI  ROV 09/01/21 --follow-up visit for patient with a history of tobacco use and associated mild COPD.  Also small cell lung cancer for which she underwent chemoradiation, currently on observation.  She has chronic cough in the setting of this as well as chronic rhinitis.  Unclear whether she has any associated GERD.  I tried starting her on empiric pantoprazole, Atrovent nasal spray to see if she would get benefit.  Cough really did not change so she was able to stop these. She does still feel that she has some throat congestion. She coughs up white / clear mucous every morning, also coughs in the evening when she lays down  She is still smoking 1 pk/day.  She does not get the flu shot, had the 2 original covid shots - no boosters.   CT chest 08/29/2021 reviewed by me, shows peribronchovascular groundglass nodularity in the right upper and lower lobes posttreatment, unchanged.   ROV 09/19/22 --62 year old woman with a history of former tobacco, mild COPD, small cell lung cancer for which she underwent chemoradiation.  Also with GERD, rhinitis and chronic cough. She just restarted her fluticasone nasal spray Smoking approximately 1 pk/day.  Today she reports a lot of saliva, some cough, more in the morning. Also with reflux, happening in am. Also some cough when she lays down at night. No SOB, able to exert. Does not get the flu shot, had the 1st 2 covid shots but no boosters.   Most recent CT scan of the chest 09/17/2022 reviewed by me shows no evidence of posttreatment recurrence, there is scar in the right upper and lower lobes.  I previously identified 2 mm right lower lobe pulmonary nodules not seen on this study.  Left adrenal nodule 1.8 cm stable.   Review of Systems As per HPI     Objective:   Physical Exam Vitals:   09/19/22 0829  BP: 124/72  Pulse: (!) 109  Temp: 97.6 F (36.4 C)   TempSrc: Oral  SpO2: 98%  Weight: 180 lb 12.8 oz (82 kg)  Height: 5' 8.5" (1.74 m)   Gen: Pleasant, well-nourished, in no distress,  normal affect  ENT: No lesions,  mouth clear,  oropharynx clear, no postnasal drip  Neck: No JVD, no stridor  Lungs: No use of accessory muscles, no crackles or wheezing on normal respiration, no wheeze on forced expiration  Cardiovascular: RRR, heart sounds normal, no murmur or gallops, no peripheral edema  Musculoskeletal: No deformities, no cyanosis or clubbing  Neuro: alert, awake, non focal  Skin: Warm, no lesions or rash      Assessment & Plan:  COPD, mild (H[facility]) Good functional capacity.  Not currently on bronchodilator therapy.  Most important thing here is for her to stop smoking.  She never gets flu shot, had the first 2 COVID vaccines but does not want any boosters.  Discussed this with her.  Hold off on bronchodilators for now.  Would consider if she has a change in her symptoms.  Small cell carcinoma of right lung ([facility]) Overall doing well without any evidence of recurrence.  Following with Dr. Julien Nordmann, XRT done by Dr. Lisbeth Renshaw.  She had an early repeat CT chest 09/17/2022 after a 2 mm right lower lobe nodule was seen on her surveillance film.  No evidence that nodule on her most recent scan, should be able to  go back to getting her CTs every 6 months.  She will arrange with Dr. Julien Nordmann.  Cough More active lately.  She has a lot of saliva, question whether she is also having some rhinitis.  Also question some active GERD.  She just restarted fluticasone nasal spray and we will add PPI to do a 1 month trial to see if she gets benefit.   Baltazar Apo, MD, PhD 09/19/2022, 8:54 AM [facility] [phone] or if no answer [phone]

## 2022-09-19 NOTE — Assessment & Plan Note (Signed)
Overall doing well without any evidence of recurrence.  Following with Dr. Julien Nordmann, XRT done by Dr. Lisbeth Renshaw.  She had an early repeat CT chest 09/17/2022 after a 2 mm right lower lobe nodule was seen on her surveillance film.  No evidence that nodule on her most recent scan, should be able to go back to getting her CTs every 6 months.  She will arrange with Dr. Julien Nordmann.

## 2022-09-19 NOTE — Assessment & Plan Note (Signed)
Good functional capacity.  Not currently on bronchodilator therapy.  Most important thing here is for her to stop smoking.  She never gets flu shot, had the first 2 COVID vaccines but does not want any boosters.  Discussed this with her.  Hold off on bronchodilators for now.  Would consider if she has a change in her symptoms.

## 2022-09-20 ENCOUNTER — Encounter: Payer: Self-pay | Admitting: Internal Medicine

## 2022-09-20 ENCOUNTER — Inpatient Hospital Stay (HOSPITAL_BASED_OUTPATIENT_CLINIC_OR_DEPARTMENT_OTHER): Payer: 59 | Admitting: Internal Medicine

## 2022-09-20 VITALS — BP 106/71 | HR 89 | Temp 97.8°F | Resp 17 | Ht 68.5 in | Wt 178.6 lb

## 2022-09-20 DIAGNOSIS — I700 Atherosclerosis of aorta: Secondary | ICD-10-CM | POA: Diagnosis not present

## 2022-09-20 DIAGNOSIS — C3490 Malignant neoplasm of unspecified part of unspecified bronchus or lung: Secondary | ICD-10-CM

## 2022-09-20 DIAGNOSIS — Z79899 Other long term (current) drug therapy: Secondary | ICD-10-CM | POA: Diagnosis not present

## 2022-09-20 DIAGNOSIS — Z9221 Personal history of antineoplastic chemotherapy: Secondary | ICD-10-CM | POA: Diagnosis not present

## 2022-09-20 DIAGNOSIS — Z86010 Personal history of colonic polyps: Secondary | ICD-10-CM | POA: Diagnosis not present

## 2022-09-20 DIAGNOSIS — Z85828 Personal history of other malignant neoplasm of skin: Secondary | ICD-10-CM | POA: Diagnosis not present

## 2022-09-20 DIAGNOSIS — Z923 Personal history of irradiation: Secondary | ICD-10-CM | POA: Diagnosis not present

## 2022-09-20 DIAGNOSIS — J432 Centrilobular emphysema: Secondary | ICD-10-CM | POA: Diagnosis not present

## 2022-09-20 DIAGNOSIS — E785 Hyperlipidemia, unspecified: Secondary | ICD-10-CM | POA: Diagnosis not present

## 2022-09-20 DIAGNOSIS — C3411 Malignant neoplasm of upper lobe, right bronchus or lung: Secondary | ICD-10-CM | POA: Diagnosis not present

## 2022-09-20 NOTE — Progress Notes (Signed)
[place] [phone]   [phone]  OFFICE PROGRESS NOTE  Susan Cortez, Susan Filler, MD [address] [place] [address]   DIAGNOSIS: Limited stage (T1c, N2, M0) Small Cell Lung Cancer. She presented with a right upper lobe suprahilar nodule/adenopathy, right paratracheal lymph node, and a hypermetabolic right upper lobe nodule. She was diagnosed in February 2022.    PRIOR THERAPY:   1) Systemic chemotherapy with cisplatin 80 mg/m2 on day 1, etoposide 100 mg/m2 on days 1, 2, and 3 IV every 3 weeks with concurrent radiation.  Last dose 02/01/21. Status post 4 cycles.  2) prophylactic cranial irradiation under the care of Dr. Lisbeth Renshaw.  Last treatment on 03/28/2021   CURRENT THERAPY: Observation   INTERVAL HISTORY: Susan Cortez 62 y.o. female returns to clinic today for follow-up visit.  The patient is feeling fine today with no concerning complaints except for lump at the site of the Port-A-Cath that was removed likely from scarred tissue.  She denied having any chest pain, cough or hemoptysis.  She has no nausea, vomiting, diarrhea or constipation.  She has no headache or visual changes.  She denied having any recent weight loss or night sweats.  She is here today for evaluation with repeat CT scan of the chest for restaging of her disease.  MEDICAL HISTORY: Past Medical History:  Diagnosis Date   Allergy    Arthritis    Basal cell carcinoma 2018   Removed from Right neck per patient    Diverticulosis 2013   Dyslipidemia 03/01/2018   HLD (hyperlipidemia)    Hx of adenomatous colonic polyps 2013   OAB (overactive bladder)    Pre-diabetes    Small cell lung cancer ([facility]) 11/09/2020    ALLERGIES:  is allergic to lyrica [pregabalin], butrans [buprenorphine], chantix [varenicline], diclofenac, doxycycline, meloxicam, tolmetin, and nsaids.  MEDICATIONS:  Current Outpatient Medications  Medication Sig Dispense Refill    acetaminophen-codeine (TYLENOL #4) 300-60 MG tablet Take 1 tablet by mouth daily as needed for moderate pain. 30 tablet 2   CALCIUM PO Take 650 mg by mouth daily.     Cholecalciferol (VITAMIN D3) 125 MCG (5000 UT) CAPS Take 5,000 Units by mouth daily.     Cyanocobalamin (B-12) 100 MCG TABS      esomeprazole (NEXIUM) 40 MG capsule Take 1 capsule (40 mg total) by mouth daily at 12 noon. 30 capsule 11   fluticasone (FLONASE) 50 MCG/ACT nasal spray Place 2 sprays into both nostrils daily. 16 g 6   LORazepam (ATIVAN) 1 MG tablet TAKE 0.5 TABLET BY MOUTH EVERY 8 HOURS. TAKE 1 TABLET 30 MINUTES PRIOR TO MRI OR RADIATION 14 tablet 0   lovastatin (MEVACOR) 20 MG tablet TAKE 1 TABLET BY MOUTH EVERYDAY AT BEDTIME 90 tablet 1   metoprolol succinate (TOPROL-XL) 25 MG 24 hr tablet TAKE 1/2 TABLET BY MOUTH EVERY DAY 45 tablet 3   Multiple Vitamins-Minerals (MULTIVITAMIN ADULT, MINERALS,) TABS      triamcinolone cream (KENALOG) 0.1 % SMARTSIG:liberally Topical Twice Daily     No current facility-administered medications for this visit.    SURGICAL HISTORY:  Past Surgical History:  Procedure Laterality Date   CESAREAN SECTION     X 3   COLONOSCOPY  2013, 2016, 2019   hx colon polyps   DILATION AND CURETTAGE OF UTERUS     FUDUCIAL PLACEMENT Right 10/18/2020   Procedure: PLACEMENT OF FUDUCIAL;  Surgeon: Collene Gobble, MD;  [facility];  Service: Thoracic;  Laterality: Right;   IR IMAGING GUIDED PORT INSERTION  11/29/2020   IR REMOVAL TUN ACCESS W/ PORT W/O FL MOD SED  05/08/2022   SHOULDER ARTHROSCOPY WITH OPEN ROTATOR CUFF REPAIR Right 05/2009   TONSILLECTOMY  1977   TUBAL LIGATION     VIDEO BRONCHOSCOPY WITH ENDOBRONCHIAL NAVIGATION N/A 10/18/2020   Procedure: VIDEO BRONCHOSCOPY WITH ENDOBRONCHIAL NAVIGATION;  Surgeon: Collene Gobble, MD;  Location: [facility];  Service: Thoracic;  Laterality: N/A;   VIDEO BRONCHOSCOPY WITH ENDOBRONCHIAL ULTRASOUND N/A 10/18/2020   Procedure: VIDEO BRONCHOSCOPY WITH  ENDOBRONCHIAL ULTRASOUND;  Surgeon: Collene Gobble, MD;  Location[facility];  Service: Thoracic;  Laterality: N/A;   WISDOM TOOTH EXTRACTION      REVIEW OF SYSTEMS:  A comprehensive review of systems was negative.   PHYSICAL EXAMINATION: General appearance: alert, cooperative, and no distress Head: Normocephalic, without obvious abnormality, atraumatic Neck: no adenopathy, no JVD, supple, symmetrical, trachea midline, and thyroid not enlarged, symmetric, no tenderness/mass/nodules Lymph nodes: Cervical, supraclavicular, and axillary nodes normal. Resp: clear to auscultation bilaterally Back: symmetric, no curvature. ROM normal. No CVA tenderness. Cardio: regular rate and rhythm, S1, S2 normal, no murmur, click, rub or gallop GI: soft, non-tender; bowel sounds normal; no masses,  no organomegaly Extremities: extremities normal, atraumatic, no cyanosis or edema  ECOG PERFORMANCE STATUS: 1 - Symptomatic but completely ambulatory  Blood pressure 106/71, pulse 89, temperature 97.8 F (36.6 C), temperature source Temporal, resp. rate 17, height 5' 8.5" (1.74 m), weight 178 lb 9.6 oz (81 kg), SpO2 100 %.  LABORATORY DATA: Lab Results  Component Value Date   WBC 7.9 09/17/2022   HGB 14.7 09/17/2022   HCT 43.4 09/17/2022   MCV 92.9 09/17/2022   PLT 176 09/17/2022      Chemistry      Component Value Date/Time   NA 137 09/17/2022 0750   K 3.8 09/17/2022 0750   CL 103 09/17/2022 0750   CO2 27 09/17/2022 0750   BUN 9 09/17/2022 0750   CREATININE 0.91 09/17/2022 0750   CREATININE 0.65 06/08/2020 1050      Component Value Date/Time   CALCIUM 9.8 09/17/2022 0750   ALKPHOS 50 09/17/2022 0750   AST 22 09/17/2022 0750   ALT 22 09/17/2022 0750   BILITOT 0.5 09/17/2022 0750       RADIOGRAPHIC STUDIES: CT Chest W Contrast  Result Date: 09/17/2022 CLINICAL DATA:  Non-small cell lung cancer.  * Tracking Code: BO * EXAM: CT CHEST WITH CONTRAST TECHNIQUE: Multidetector CT imaging of the  chest was performed during intravenous contrast administration. RADIATION DOSE REDUCTION: This exam was performed according to the departmental dose-optimization program which includes automated exposure control, adjustment of the mA and/or kV according to patient size and/or use of iterative reconstruction technique. CONTRAST:  48mL OMNIPAQUE IOHEXOL 300 MG/ML  SOLN COMPARISON:  07/02/2022 and PET 11/08/2020. FINDINGS: Cardiovascular: Atherosclerotic calcification of the aorta. Heart size normal. No pericardial effusion. Mediastinum/Nodes: 13 mm low-attenuation right thyroid nodule. No follow-up recommended. (Ref: J Am Coll Radiol. 2015 Feb;12(2): 143-50).No pathologically enlarged mediastinal, hilar or axillary lymph nodes. Esophagus is mildly air-filled, which can be seen with dysmotility. Lungs/Pleura: Centrilobular emphysema. Fiducial markers in the right upper lobe. Bronchiectasis and interstitial coarsening in the right upper lobe and superior segment right lower lobe, treatment related. Previously question 2 mm right lower lobe nodule is not identified. No new pulmonary nodules. No pleural fluid. Airway is unremarkable. Upper Abdomen: Visualized portions of the  liver, gallbladder and right adrenal gland are unremarkable. Left adrenal nodules measure up to 1.8 cm and on PET 11/08/2020, 10 Hounsfield units. No specific follow-up necessary. Visualized portions of the kidneys, spleen, pancreas, stomach and bowel are unremarkable. No upper abdominal adenopathy. Musculoskeletal: Degenerative changes in the spine. No worrisome lytic or sclerotic lesions. IMPRESSION: 1. Post treatment scarring in the right upper and right lower lobes. No evidence of recurrent or metastatic disease. 2. Left adrenal adenomas. 3.  Aortic atherosclerosis (ICD10-I70.0). 4.  Emphysema (ICD10-J43.9). Electronically Signed   By: Lorin Picket M.D.   On: 09/17/2022 12:58   MR CERVICAL SPINE WO CONTRAST  Result Date: 09/10/2022 CLINICAL  DATA:  Neck pain for 14 years, worsening over the last 4 months. Repetitive motion at work. No acute injury or prior relevant surgery. EXAM: MRI CERVICAL SPINE WITHOUT CONTRAST TECHNIQUE: Multiplanar, multisequence MR imaging of the cervical spine was performed. No intravenous contrast was administered. COMPARISON:  Cervical spine radiographs 08/01/2022. Cranial MRI 06/29/2021 and 07/26/2022. FINDINGS: Despite efforts by the technologist and patient, mild motion artifact is present on today's exam and could not be eliminated. This reduces exam sensitivity and specificity. Alignment: Straightening without focal angulation or listhesis. Vertebrae: No acute or suspicious osseous findings. T1 hypointense and T2 hyperintense lesion within the C2 vertebral body is unchanged from previous cranial MRIs and considered benign based on stability. Scattered endplate degenerative changes, greatest at C4-5. Cord: Normal in signal and caliber. Posterior Fossa, vertebral arteries, paraspinal tissues: Visualized portions of the posterior fossa appear unremarkable.Bilateral vertebral artery flow voids. No significant paraspinal findings. Disc levels: C2-3: The disc appears normal. Mild asymmetric facet hypertrophy on the right without resulting spinal stenosis or nerve root encroachment. C3-4: Mild disc bulging with left greater than right facet hypertrophy. No spinal stenosis. Mild left greater than right foraminal narrowing. C4-5: Loss of disc height with posterior osteophytes covering diffusely bulging disc material. Bilateral facet hypertrophy, worse on the right. No cord deformity. Mild right and moderate left foraminal narrowing. C5-6: Spondylosis with posterior osteophytes covering diffusely bulging disc material. There is a shallow right paracentral disc protrusion which mildly deforms the ventral aspect of the cord. Mild foraminal narrowing bilaterally, greater on the left. C6-7: Spondylosis with posterior osteophytes  covering diffusely bulging disc material. Asymmetric medial left foraminal disc osteophyte complex contributes to moderate to severe left foraminal narrowing and probable left C7 nerve root encroachment. The right foramen is moderately narrowed. The CSF surrounding the cord is partially effaced without significant cord deformity. C7-T1: Mild disc bulging with asymmetric facet hypertrophy on the left. No central spinal stenosis. Mild foraminal narrowing bilaterally. IMPRESSION: 1. Multilevel cervical spondylosis, without acute findings or clear explanation for the patient's symptoms. 2. At C5-6, there is a shallow right paracentral disc protrusion which mildly deforms the ventral aspect of the cord. Mild foraminal narrowing bilaterally. 3. At C6-7, there is moderate to severe left and moderate right foraminal narrowing due to spondylosis. Mild central spinal stenosis without cord deformity. 4. Moderate left foraminal narrowing at C4-5 due to spondylosis. Electronically Signed   By: Richardean Sale M.D.   On: 09/10/2022 13:18    ASSESSMENT AND PLAN: This is a very pleasant 62 years old white female with limited stage (T1c, N2, M0) small cell lung cancer presented with right upper lobe and suprahilar mass in addition to mediastinal lymphadenopathy diagnosed in February 2022.  The patient underwent systemic chemotherapy with cisplatin 80 mg/M2 on day 1 and etoposide 100 mg/M2 on days 1, 2  and 3 every 3 weeks status post 4 cycles. This was concurrent with radiotherapy followed by prophylactic cranial irradiation. The patient is currently on observation and she is feeling fine with no concerning complaints. She had repeat CT scan of the chest performed recently.  I personally and independently reviewed the scan and discussed the result with the patient today. Her scan showed no concerning findings for disease recurrence or metastasis. I recommended for her to continue on observation with repeat CT scan of the chest  in 6 months. The patient was advised to call immediately if she has any other concerning symptoms in the interval. The patient voices understanding of current disease status and treatment options and is in agreement with the current care plan.  All questions were answered. The patient knows to call the clinic with any problems, questions or concerns. We can certainly see the patient much sooner if necessary.  Disclaimer: This note was dictated with voice recognition software. Similar sounding words can inadvertently be transcribed and may not be corrected upon review.

## 2022-09-21 ENCOUNTER — Other Ambulatory Visit: Payer: Self-pay | Admitting: Radiation Therapy

## 2022-09-21 ENCOUNTER — Encounter: Payer: Self-pay | Admitting: Physical Medicine & Rehabilitation

## 2022-09-21 ENCOUNTER — Encounter: Payer: 59 | Attending: Registered Nurse | Admitting: Physical Medicine & Rehabilitation

## 2022-09-21 VITALS — BP 108/66 | HR 111 | Wt 179.0 lb

## 2022-09-21 DIAGNOSIS — C7931 Secondary malignant neoplasm of brain: Secondary | ICD-10-CM

## 2022-09-21 DIAGNOSIS — M4802 Spinal stenosis, cervical region: Secondary | ICD-10-CM | POA: Diagnosis not present

## 2022-09-21 NOTE — Progress Notes (Signed)
Subjective:    Patient ID: Susan Cortez, female    DOB: Jul 28, 1961, 62 y.o.   MRN: [id number]  HPI  62 year old female with history of chronic neck pain.  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  She is here today because of increasing neck pain.  This prompted cervical spine MRI.  No radiating pain down the arm.  She does have some right shoulder pain however she has a history of rotator cuff injury there.  She is taking Tylenol with codeine No. 4 1 tablet/day.  She is independent with all her self-care and mobility.  Increasing Pain in neck radiating to Right scapular area   Interval hx small cell lung Ca  Chest and brain irradiation  MRI CERVICAL SPINE WITHOUT CONTRAST   TECHNIQUE: Multiplanar, multisequence MR imaging of the cervical spine was performed. No intravenous contrast was administered.   COMPARISON:  Cervical spine radiographs 08/01/2022. Cranial MRI 06/29/2021 and 07/26/2022.   FINDINGS: Despite efforts by the technologist and patient, mild motion artifact is present on today's exam and could not be eliminated. This reduces exam sensitivity and specificity.   Alignment: Straightening without focal angulation or listhesis.   Vertebrae: No acute or suspicious osseous findings. T1 hypointense and T2 hyperintense lesion within the C2 vertebral body is unchanged from previous cranial MRIs and considered benign based on stability. Scattered endplate degenerative changes, greatest at C4-5.   Cord: Normal in signal and caliber.   Posterior Fossa, vertebral arteries, paraspinal tissues: Visualized portions of the posterior fossa appear unremarkable.Bilateral vertebral artery flow voids. No significant paraspinal findings.   Disc levels:   C2-3: The disc appears normal. Mild asymmetric facet hypertrophy on the right without resulting spinal stenosis or nerve root encroachment.   C3-4: Mild disc bulging with left  greater than right facet hypertrophy. No spinal stenosis. Mild left greater than right foraminal narrowing.   C4-5: Loss of disc height with posterior osteophytes covering diffusely bulging disc material. Bilateral facet hypertrophy, worse on the right. No cord deformity. Mild right and moderate left foraminal narrowing.   C5-6: Spondylosis with posterior osteophytes covering diffusely bulging disc material. There is a shallow right paracentral disc protrusion which mildly deforms the ventral aspect of the cord. Mild foraminal narrowing bilaterally, greater on the left.   C6-7: Spondylosis with posterior osteophytes covering diffusely bulging disc material. Asymmetric medial left foraminal disc osteophyte complex contributes to moderate to severe left foraminal narrowing and probable left C7 nerve root encroachment. The right foramen is moderately narrowed. The CSF surrounding the cord is partially effaced without significant cord deformity.   C7-T1: Mild disc bulging with asymmetric facet hypertrophy on the left. No central spinal stenosis. Mild foraminal narrowing bilaterally.   IMPRESSION: 1. Multilevel cervical spondylosis, without acute findings or clear explanation for the patient's symptoms. 2. At C5-6, there is a shallow right paracentral disc protrusion which mildly deforms the ventral aspect of the cord. Mild foraminal narrowing bilaterally. 3. At C6-7, there is moderate to severe left and moderate right foraminal narrowing due to spondylosis. Mild central spinal stenosis without cord deformity. 4. Moderate left foraminal narrowing at C4-5 due to spondylosis.     Electronically Signed   By: Richardean Sale M.D.   On: 09/10/2022 13:18   Pain Inventory Average Pain 7 Pain Right Now 6 My pain is intermittent, sharp, burning, stabbing, and aching  In the last 24 hours, has pain interfered with the following? General activity  2 Relation with others 0 Enjoyment of  life 2 What TIME of day is your pain at its worst? daytime Sleep (in general) Poor  Pain is worse with: walking, bending, standing, and some activites Pain improves with: rest, pacing activities, and medication Relief from Meds: 7  Family History  Problem Relation Age of Onset   Diabetes Father    Heart disease Father    Hyperlipidemia Father    Colon cancer Neg Hx    Colon polyps Neg Hx    Esophageal cancer Neg Hx    Rectal cancer Neg Hx    Stomach cancer Neg Hx    Social History   Socioeconomic History   Marital status: Divorced    Spouse name: Not on file   Number of children: Not on file   Years of education: Not on file   Highest education level: Not on file  Occupational History   Not on file  Tobacco Use   Smoking status: Every Day    Packs/day: 1.00    Years: 45.00    Total pack years: 45.00    Types: Cigarettes   Smokeless tobacco: Never   Tobacco comments:    1 pack smoked per day [facility] 09/19/22  Vaping Use   Vaping Use: Former  Substance and Sexual Activity   Alcohol use: No   Drug use: No   Sexual activity: Not on file    Comment: Tubal Ligation  Other Topics Concern   Not on file  Social History Narrative   Not on file   Social Determinants of Health   Financial Resource Strain: Low Risk  (04/18/2021)   Overall Financial Resource Strain (CARDIA)    Difficulty of Paying Living Expenses: Not hard at all  Food Insecurity: No Food Insecurity (04/18/2021)   Hunger Vital Sign    Worried About Running Out of Food in the Last Year: Never true    Ran Out of Food in the Last Year: Never true  Transportation Needs: No Transportation Needs (04/18/2021)   PRAPARE - Hydrologist (Medical): No    Lack of Transportation (Non-Medical): No  Physical Activity: Inactive (04/18/2021)   Exercise Vital Sign    Days of Exercise per Week: 0 days    Minutes of Exercise per Session: 0 min  Stress: Stress Concern Present (04/18/2021)   [place]    Feeling of Stress : To some extent  Social Connections: Socially Isolated (04/18/2021)   Social Connection and Isolation Panel [NHANES]    Frequency of Communication with Friends and Family: More than three times a week    Frequency of Social Gatherings with Friends and Family: More than three times a week    Attends Religious Services: Never    Marine scientist or Organizations: No    Attends Archivist Meetings: Never    Marital Status: Divorced   Past Surgical History:  Procedure Laterality Date   CESAREAN SECTION     X 3   COLONOSCOPY  2013, 2016, 2019   hx colon polyps   DILATION AND CURETTAGE OF UTERUS     FUDUCIAL PLACEMENT Right 10/18/2020   Procedure: PLACEMENT OF FUDUCIAL;  Surgeon: Collene Gobble, MD;  Location: [facility];  Service: Thoracic;  Laterality: Right;   IR IMAGING GUIDED PORT INSERTION  11/29/2020   IR REMOVAL TUN ACCESS W/ PORT W/O FL MOD SED  05/08/2022   SHOULDER ARTHROSCOPY WITH  OPEN ROTATOR CUFF REPAIR Right 05/2009   TONSILLECTOMY  1977   TUBAL LIGATION     VIDEO BRONCHOSCOPY WITH ENDOBRONCHIAL NAVIGATION N/A 10/18/2020   Procedure: VIDEO BRONCHOSCOPY WITH ENDOBRONCHIAL NAVIGATION;  Surgeon: Collene Gobble, MD;  Location: [facility];  Service: Thoracic;  Laterality: N/A;   VIDEO BRONCHOSCOPY WITH ENDOBRONCHIAL ULTRASOUND N/A 10/18/2020   Procedure: VIDEO BRONCHOSCOPY WITH ENDOBRONCHIAL ULTRASOUND;  Surgeon: Collene Gobble, MD;  Location: [facility];  Service: Thoracic;  Laterality: N/A;   WISDOM TOOTH EXTRACTION     Past Surgical History:  Procedure Laterality Date   CESAREAN SECTION     X 3   COLONOSCOPY  2013, 2016, 2019   hx colon polyps   DILATION AND CURETTAGE OF UTERUS     FUDUCIAL PLACEMENT Right 10/18/2020   Procedure: PLACEMENT OF FUDUCIAL;  Surgeon: Collene Gobble, MD;  [facility];  Service: Thoracic;  Laterality: Right;   IR IMAGING GUIDED PORT INSERTION  11/29/2020    IR REMOVAL TUN ACCESS W/ PORT W/O FL MOD SED  05/08/2022   SHOULDER ARTHROSCOPY WITH OPEN ROTATOR CUFF REPAIR Right 05/2009   TONSILLECTOMY  1977   TUBAL LIGATION     VIDEO BRONCHOSCOPY WITH ENDOBRONCHIAL NAVIGATION N/A 10/18/2020   Procedure: VIDEO BRONCHOSCOPY WITH ENDOBRONCHIAL NAVIGATION;  Surgeon: Collene Gobble, MD;  Location: [facility];  Service: Thoracic;  Laterality: N/A;   VIDEO BRONCHOSCOPY WITH ENDOBRONCHIAL ULTRASOUND N/A 10/18/2020   Procedure: VIDEO BRONCHOSCOPY WITH ENDOBRONCHIAL ULTRASOUND;  Surgeon: Collene Gobble, MD;  Location: [facility];  Service: Thoracic;  Laterality: N/A;   WISDOM TOOTH EXTRACTION     Past Medical History:  Diagnosis Date   Allergy    Arthritis    Basal cell carcinoma 2018   Removed from Right neck per patient    Diverticulosis 2013   Dyslipidemia 03/01/2018   HLD (hyperlipidemia)    Hx of adenomatous colonic polyps 2013   OAB (overactive bladder)    Pre-diabetes    Small cell lung cancer ([facility]) 11/09/2020   BP 108/66   Pulse (!) 111   Wt 179 lb (81.2 kg)   LMP  (LMP Unknown)   SpO2 96%   BMI 26.82 kg/m   Opioid Risk Score:   Fall Risk Score:  `1  Depression screen [facility] 2/9     09/21/2022    3:19 PM 09/07/2022    9:26 AM 06/18/2022    8:28 AM 04/19/2022    1:06 PM 01/24/2022   10:47 AM 10/25/2021   10:27 AM 07/12/2021   10:17 AM  Depression screen PHQ 2/9  Decreased Interest 0 0 0 0 0 0 0  Down, Depressed, Hopeless 0 0 0 0 0 0 0  PHQ - 2 Score 0 0 0 0 0 0 0    Review of Systems  Musculoskeletal:  Positive for back pain and neck pain.  All other systems reviewed and are negative.      Objective:   Physical Exam  Cervical spine range of motion 50% flexion extension lateral bending and rotation Negative foraminal compression test Motor strength is 4/5 right shoulder 5/5 left shoulder 5/5 bilateral elbow flexors extensors finger flexors and extensors Sensation normal bilateral upper limbs Deep tendon reflex 2+ bilateral biceps  triceps brachial radialis There is mild tenderness palpation right upper trapezius area no tenderness along the cervical paraspinal area. No evidence of torticollis      Assessment & Plan:   #1.  Gradually worsening cervical pain, repeat MRI showing cervical  degenerative disc and cervical spondylosis there is mild to moderate stenosis C5-6 C6-7.  No evidence of myelopathy on exam and no evidence of cord signal changes on MRI. Left side foraminal stenosis at C6-7 is asymptomatic. At this point would recommend working on upper thoracic range of motion we discussed certain exercises for this 2.  Continue current pain medications Tylenol with codeine 1 tablet/day Follow-up with nurse practitioner in approximately 1 month.

## 2022-09-21 NOTE — Progress Notes (Signed)
Orders placed for port access and de-access the day of brain MRI at [facility].   Mont Dutton R.T.(R)(T) Radiation Special Procedures Navigator

## 2022-09-25 ENCOUNTER — Encounter: Payer: Self-pay | Admitting: Internal Medicine

## 2022-09-26 ENCOUNTER — Telehealth: Payer: Self-pay | Admitting: Radiation Therapy

## 2022-09-26 NOTE — Telephone Encounter (Signed)
I spoke with the patient about her upcoming brain MRI and telephone follow-up with [facility]. She had already seen these visits in MyChart and plans to attend.   [name] confirmed that she no longer has a port to peripheral IV access is good.  Mont Dutton R.T.(R)(T)  Radiation Special Procedures Navigator

## 2022-10-02 ENCOUNTER — Inpatient Hospital Stay: Payer: 59

## 2022-10-02 ENCOUNTER — Ambulatory Visit: Payer: Medicare Other | Admitting: Internal Medicine

## 2022-10-04 ENCOUNTER — Inpatient Hospital Stay: Payer: 59 | Admitting: Internal Medicine

## 2022-10-16 IMAGING — MR MR HEAD WO/W CM
13 of 17 series · 37 of 48 positions shown · IV contrast (gadavist)
Comparison: None.

CLINICAL DATA: Small cell lung cancer. Staging.

EXAM:
MRI HEAD WITHOUT AND WITH CONTRAST
TECHNIQUE: Multiplanar, multiecho pulse sequences of the brain and surrounding
structures were obtained without and with intravenous contrast.
CONTRAST:  9mL GADAVIST GADOBUTROL 1 MMOL/ML IV SOLN

[Series 5: DWI · axial · 3.0mm · 1.36mm/px · z∈[-47,+93]mm · 6 of 96 slices shown (1 of 4)]
[im 1/96]
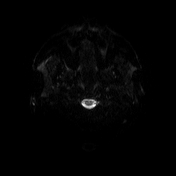
[im 20/96]
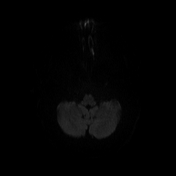
[im 39/96]
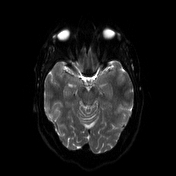
[im 58/96]
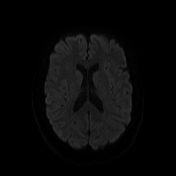
[im 77/96]
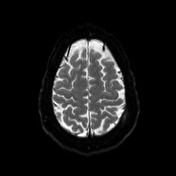
[im 96/96]
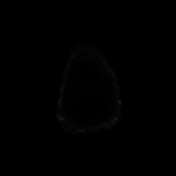

[Series 6: DWI · axial · 3.0mm · 1.36mm/px · z∈[-47,+93]mm · 3 of 48 slices shown (2 of 4)]
[im 1/48]
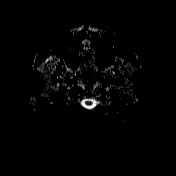
[im 24/48]
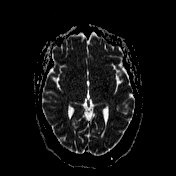
[im 48/48]
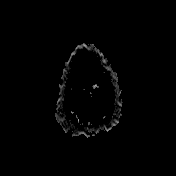

[Series 7: T1 · sagittal · 5.0mm · 0.75mm/px · 2 of 24 slices shown (1 of 2)]
[im 1/24]
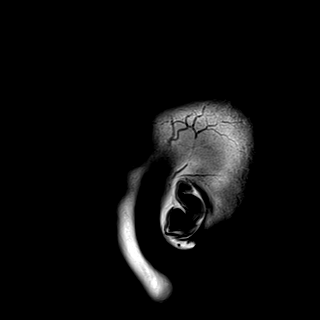
[im 24/24]
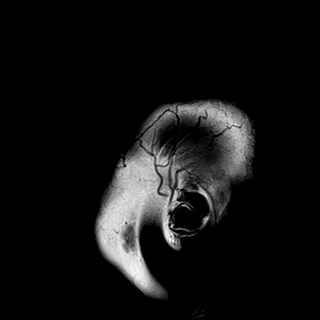

[Series 8: T2 · axial · 5.0mm · 0.45mm/px · z∈[-49,+93]mm · 2 of 23 slices shown]
[im 1/23]
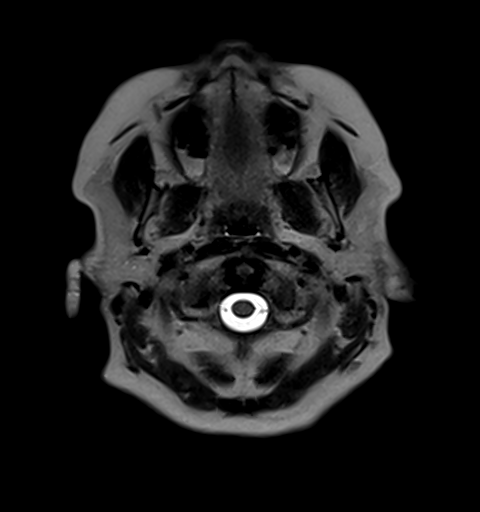
[im 23/23]
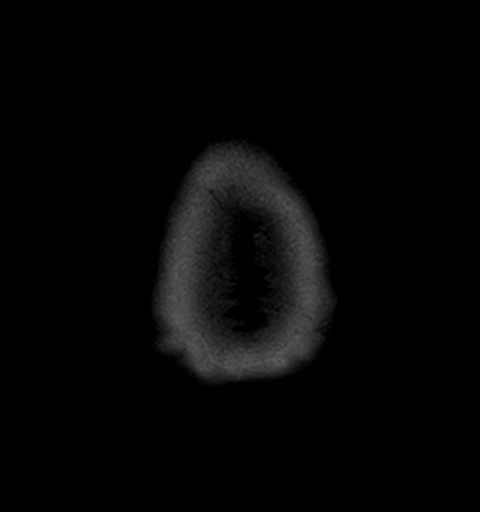

[Series 9: GRE · axial · 3.0mm · 0.45mm/px · z∈[-44,+96]mm · 3 of 48 slices shown]
[im 1/48]
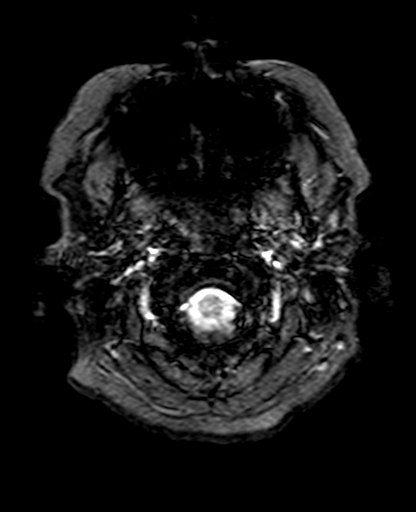
[im 24/48]
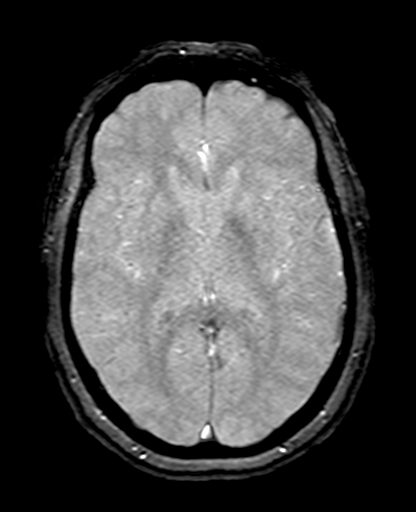
[im 48/48]
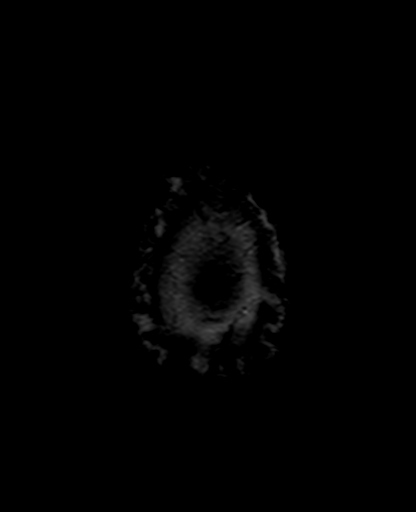

[Series 10: FLAIR · axial · 3.0mm · 0.86mm/px · z∈[-54,+95]mm · 3 of 51 slices shown]
[im 1/51]
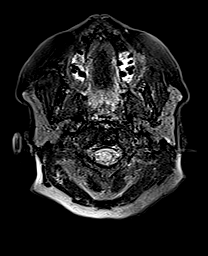
[im 26/51]
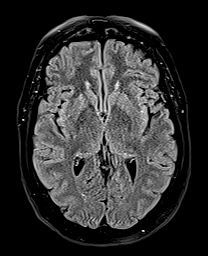
[im 51/51]
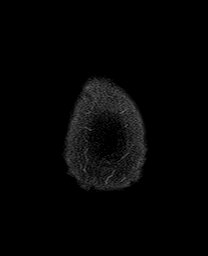

[Series 11: T1 · axial · 3.0mm · 0.45mm/px · z∈[-48,+101]mm · 3 of 51 slices shown (2 of 2)]
[im 1/51]
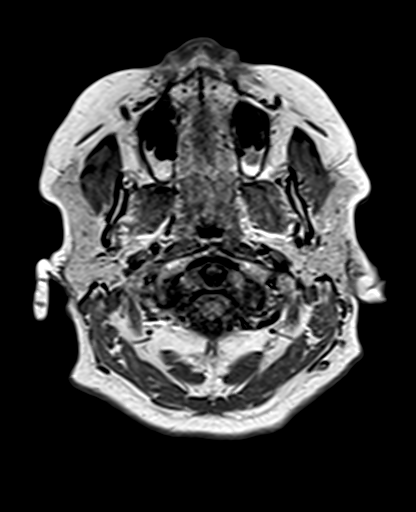
[im 26/51]
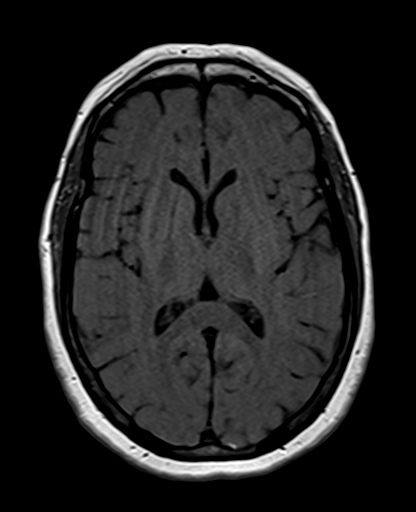
[im 51/51]
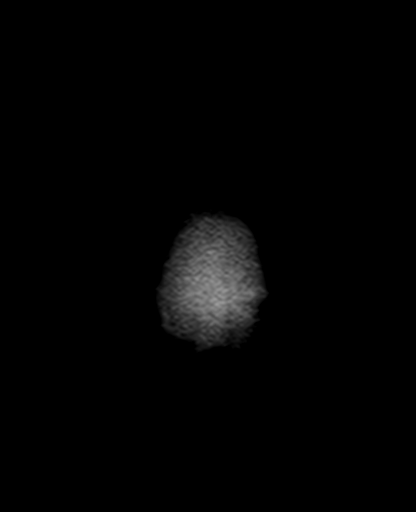

[Series 12: DWI · coronal · 5.0mm · 1.31mm/px · 4 of 64 slices shown (3 of 4)]
[im 1/64]
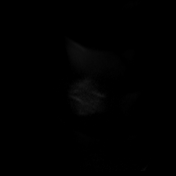
[im 22/64]
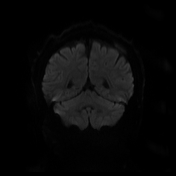
[im 43/64]
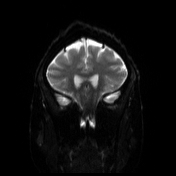
[im 64/64]
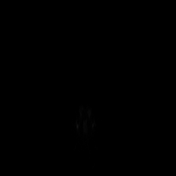

[Series 13: DWI · coronal · 5.0mm · 1.31mm/px · 2 of 32 slices shown (4 of 4)]
[im 1/32]
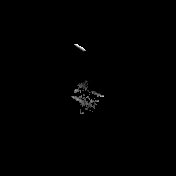
[im 32/32]
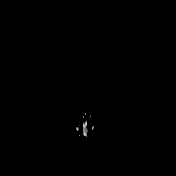

[Series 14: T2 post-contrast · coronal · 5.0mm · 0.86mm/px · 2 of 32 slices shown]
[im 1/32]
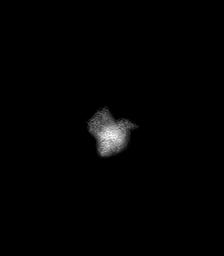
[im 32/32]
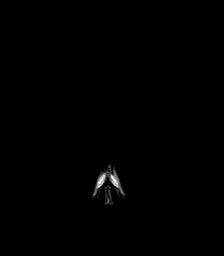

[Series 15: T1 post-contrast · axial · 3.0mm · 0.45mm/px · z∈[-45,+103]mm · 3 of 51 slices shown (1 of 3)]
[im 1/51]
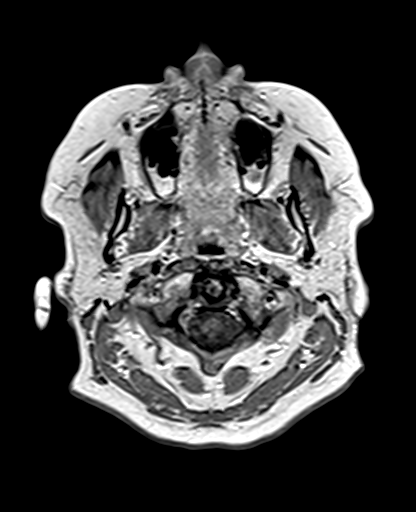
[im 26/51]
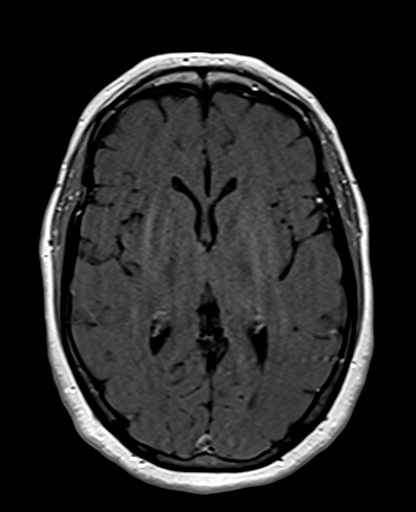
[im 51/51]
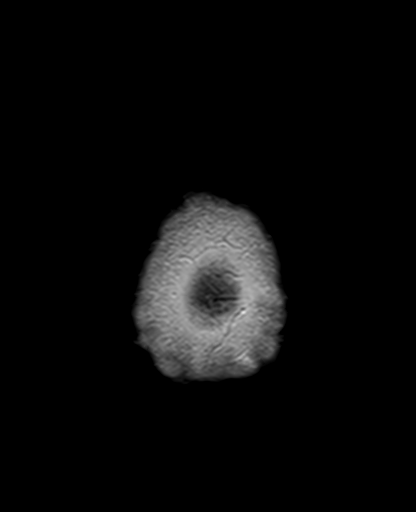

[Series 16: T1 post-contrast · coronal · 5.0mm · 0.43mm/px · 2 of 32 slices shown (2 of 3)]
[im 1/32]
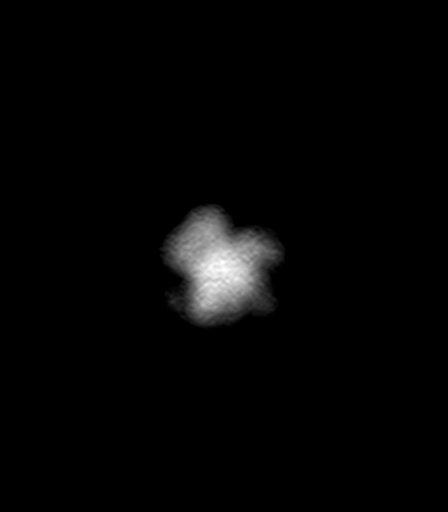
[im 32/32]
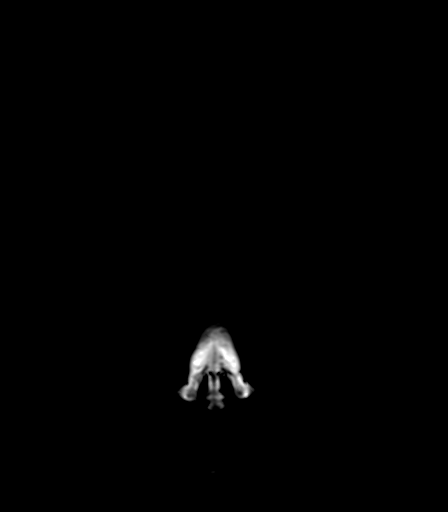

[Series 17: T1 post-contrast · sagittal · 5.0mm · 0.94mm/px · 2 of 24 slices shown (3 of 3)]
[im 1/24]
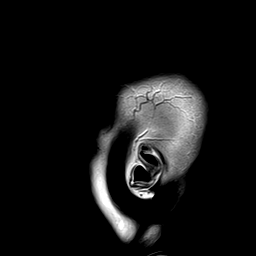
[im 24/24]
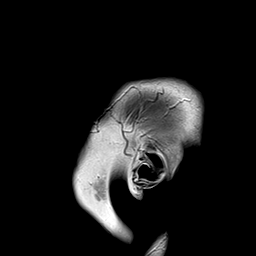

[37 of 48 positions shown; findings below may reference images not displayed]

FINDINGS: Brain: No acute infarct, hemorrhage, or mass lesion is present. No
significant white matter lesions are present. The ventricles are of
normal size. No significant extraaxial fluid collection is present.

The internal auditory canals are within normal limits. Remote
lacunar infarcts are present in the right PICA territory. Brainstem
and cerebellum are otherwise within normal limits.

Vascular: Flow is present in the major intracranial arteries.

Skull and upper cervical spine: The craniocervical junction is
normal. Upper cervical spine is within normal limits. Marrow signal
is unremarkable.

Sinuses/Orbits: The paranasal sinuses and mastoid air cells are
clear. The globes and orbits are within normal limits.
IMPRESSION: 1. No evidence for metastatic disease to the brain or meninges.
2. Remote lacunar infarcts of the right PICA territory.

## 2022-10-26 ENCOUNTER — Encounter: Payer: Self-pay | Admitting: Registered Nurse

## 2022-10-26 ENCOUNTER — Encounter: Payer: 59 | Attending: Registered Nurse | Admitting: Registered Nurse

## 2022-10-26 ENCOUNTER — Encounter: Payer: Self-pay | Admitting: Internal Medicine

## 2022-10-26 VITALS — BP 106/71 | HR 96

## 2022-10-26 DIAGNOSIS — Z5181 Encounter for therapeutic drug level monitoring: Secondary | ICD-10-CM | POA: Insufficient documentation

## 2022-10-26 DIAGNOSIS — M4802 Spinal stenosis, cervical region: Secondary | ICD-10-CM | POA: Insufficient documentation

## 2022-10-26 DIAGNOSIS — G894 Chronic pain syndrome: Secondary | ICD-10-CM | POA: Insufficient documentation

## 2022-10-26 DIAGNOSIS — M25511 Pain in right shoulder: Secondary | ICD-10-CM | POA: Insufficient documentation

## 2022-10-26 DIAGNOSIS — Z79891 Long term (current) use of opiate analgesic: Secondary | ICD-10-CM | POA: Diagnosis not present

## 2022-10-26 DIAGNOSIS — M542 Cervicalgia: Secondary | ICD-10-CM | POA: Insufficient documentation

## 2022-10-26 DIAGNOSIS — G8929 Other chronic pain: Secondary | ICD-10-CM | POA: Insufficient documentation

## 2022-10-26 MED ORDER — ACETAMINOPHEN-CODEINE 300-60 MG PO TABS: 1.0000 | ORAL_TABLET | Freq: Every day | ORAL | 2 refills | Status: DC | PRN

## 2022-10-26 NOTE — Progress Notes (Signed)
Subjective:    Patient ID: Susan Cortez, female    DOB: February 16, 1961, 61 y.o.   MRN: [id number]  HPI: Susan Cortez is a 62 y.o. female who returns for follow up appointment for chronic pain and medication refill. She states her pain is located in her neck and right shoulder. She rates her pain 6. Her current exercise regime is walking and performing stretching exercises.  Susan Cortez Morphine equivalent is 9.00 MME.   UDS ordered    Pain Inventory Average Pain 7 Pain Right Now 6 My pain is constant, sharp, burning, stabbing, and aching  In the last 24 hours, has pain interfered with the following? General activity 1 Relation with others 0 Enjoyment of life 1 What TIME of day is your pain at its worst? daytime and evening Sleep (in general) Poor  Pain is worse with: walking, bending, standing, and some activites Pain improves with: rest and medication Relief from Meds: 5  Family History  Problem Relation Age of Onset   Diabetes Father    Heart disease Father    Hyperlipidemia Father    Colon cancer Neg Hx    Colon polyps Neg Hx    Esophageal cancer Neg Hx    Rectal cancer Neg Hx    Stomach cancer Neg Hx    Social History   Socioeconomic History   Marital status: Divorced    Spouse name: Not on file   Number of children: Not on file   Years of education: Not on file   Highest education level: Not on file  Occupational History   Not on file  Tobacco Use   Smoking status: Every Day    Packs/day: 1.00    Years: 45.00    Total pack years: 45.00    Types: Cigarettes   Smokeless tobacco: Never   Tobacco comments:    1 pack smoked per day ARJ 09/19/22  Vaping Use   Vaping Use: Former  Substance and Sexual Activity   Alcohol use: No   Drug use: No   Sexual activity: Not on file    Comment: Tubal Ligation  Other Topics Concern   Not on file  Social History Narrative   Not on file   Social Determinants of Health   Financial Resource Strain: Low Risk   (04/18/2021)   Overall Financial Resource Strain (CARDIA)    Difficulty of Paying Living Expenses: Not hard at all  Food Insecurity: No Food Insecurity (04/18/2021)   Hunger Vital Sign    Worried About Running Out of Food in the Last Year: Never true    Ran Out of Food in the Last Year: Never true  Transportation Needs: No Transportation Needs (04/18/2021)   PRAPARE - Hydrologist (Medical): No    Lack of Transportation (Non-Medical): No  Physical Activity: Inactive (04/18/2021)   Exercise Vital Sign    Days of Exercise per Week: 0 days    Minutes of Exercise per Session: 0 min  Stress: Stress Concern Present (04/18/2021)   [place]    Feeling of Stress : To some extent  Social Connections: Socially Isolated (04/18/2021)   Social Connection and Isolation Panel [NHANES]    Frequency of Communication with Friends and Family: More than three times a week    Frequency of Social Gatherings with Friends and Family: More than three times a week    Attends Religious Services: Never    Active  Member of Clubs or Organizations: No    Attends Archivist Meetings: Never    Marital Status: Divorced   Past Surgical History:  Procedure Laterality Date   CESAREAN SECTION     X 3   COLONOSCOPY  2013, 2016, 2019   hx colon polyps   DILATION AND CURETTAGE OF UTERUS     FUDUCIAL PLACEMENT Right 10/18/2020   Procedure: PLACEMENT OF FUDUCIAL;  Surgeon: Collene Gobble, MD;  [facility];  Service: Thoracic;  Laterality: Right;   IR IMAGING GUIDED PORT INSERTION  11/29/2020   IR REMOVAL TUN ACCESS W/ PORT W/O FL MOD SED  05/08/2022   SHOULDER ARTHROSCOPY WITH OPEN ROTATOR CUFF REPAIR Right 05/2009   TONSILLECTOMY  1977   TUBAL LIGATION     VIDEO BRONCHOSCOPY WITH ENDOBRONCHIAL NAVIGATION N/A 10/18/2020   Procedure: VIDEO BRONCHOSCOPY WITH ENDOBRONCHIAL NAVIGATION;  Surgeon: Collene Gobble, MD;   Location: [facility];  Service: Thoracic;  Laterality: N/A;   VIDEO BRONCHOSCOPY WITH ENDOBRONCHIAL ULTRASOUND N/A 10/18/2020   Procedure: VIDEO BRONCHOSCOPY WITH ENDOBRONCHIAL ULTRASOUND;  Surgeon: Collene Gobble, MD;  Location: [facility];  Service: Thoracic;  Laterality: N/A;   WISDOM TOOTH EXTRACTION     Past Surgical History:  Procedure Laterality Date   CESAREAN SECTION     X 3   COLONOSCOPY  2013, 2016, 2019   hx colon polyps   DILATION AND CURETTAGE OF UTERUS     FUDUCIAL PLACEMENT Right 10/18/2020   Procedure: PLACEMENT OF FUDUCIAL;  Surgeon: Collene Gobble, MD;  [facility];  Service: Thoracic;  Laterality: Right;   IR IMAGING GUIDED PORT INSERTION  11/29/2020   IR REMOVAL TUN ACCESS W/ PORT W/O FL MOD SED  05/08/2022   SHOULDER ARTHROSCOPY WITH OPEN ROTATOR CUFF REPAIR Right 05/2009   TONSILLECTOMY  1977   TUBAL LIGATION     VIDEO BRONCHOSCOPY WITH ENDOBRONCHIAL NAVIGATION N/A 10/18/2020   Procedure: VIDEO BRONCHOSCOPY WITH ENDOBRONCHIAL NAVIGATION;  Surgeon: Collene Gobble, MD;  Location: [facility];  Service: Thoracic;  Laterality: N/A;   VIDEO BRONCHOSCOPY WITH ENDOBRONCHIAL ULTRASOUND N/A 10/18/2020   Procedure: VIDEO BRONCHOSCOPY WITH ENDOBRONCHIAL ULTRASOUND;  Surgeon: Collene Gobble, MD;  Location: [facility];  Service: Thoracic;  Laterality: N/A;   WISDOM TOOTH EXTRACTION     Past Medical History:  Diagnosis Date   Allergy    Arthritis    Basal cell carcinoma 2018   Removed from Right neck per patient    Diverticulosis 2013   Dyslipidemia 03/01/2018   HLD (hyperlipidemia)    Hx of adenomatous colonic polyps 2013   OAB (overactive bladder)    Pre-diabetes    Small cell lung cancer ([place]) 11/09/2020   LMP  (LMP Unknown)   Opioid Risk Score:   Fall Risk Score:  `1  Depression screen [facility] 2/9     10/26/2022   10:35 AM 09/21/2022    3:19 PM 09/07/2022    9:26 AM 06/18/2022    8:28 AM 04/19/2022    1:06 PM 01/24/2022   10:47 AM 10/25/2021   10:27 AM  Depression screen PHQ 2/9   Decreased Interest 0 0 0 0 0 0 0  Down, Depressed, Hopeless 0 0 0 0 0 0 0  PHQ - 2 Score 0 0 0 0 0 0 0      Review of Systems  Musculoskeletal:  Positive for back pain and neck pain.      Objective:   Physical Exam Vitals and nursing  note reviewed.  Constitutional:      Appearance: Normal appearance.  Neck:     Comments: Cervical Paraspinal Tenderness: C-5-C-6 Cardiovascular:     Rate and Rhythm: Normal rate and regular rhythm.     Pulses: Normal pulses.  Pulmonary:     Effort: Pulmonary effort is normal.     Breath sounds: Normal breath sounds.  Musculoskeletal:     Cervical back: Normal range of motion and neck supple.     Comments: Normal Muscle Bulk and Muscle Testing Reveals:  Upper Extremities: Right: Decrease ROM 45 Degrees and Muscle Strength 5/5 Right AC Joint Tenderness Left Upper Extremity: Full ROM and Muscle Strength 5/5  Lower Extremities: Full ROM and Muscle Strength 5/5 Arises from Chair with ease Narrow Based  Gait     Skin:    General: Skin is warm and dry.  Neurological:     Mental Status: She is alert and oriented to person, place, and time.  Psychiatric:        Mood and Affect: Mood normal.        Behavior: Behavior normal.         Assessment & Plan:  1.Cervical spinal stenosis without evidence of radiculopathy/ Cervical Radiculitis . Chronic neck pain: Continue to Monitor.  Refilled Tylenol #4  300/'60mg'$  daily, #30. 10/26/2022. We will continue the opioid monitoring program, this consists of regular clinic visits, examinations, urine drug screen, pill counts as well as use of [place] Controlled Substance Reporting system. A 12 month History has been reviewed on the [place] on 10/26/2022. 2. Right shoulder rotator cuff tear, status post repair with chronic pain and contracture : Continue with exercise regime as tolerated .10/26/2022. 3. Insomnia: Continue Melatonin. 10/26/2022 4. Chronic  Bilateral Lower Back Pain without Sciatica: No complaints today. Continue HEP as tolerated. Continue to Monitor. 10/26/2022  5. Right Greater Trochanter Bursitis: No complaints Today. Continue to Alternate Ice and Heat Therapy. Continue to Monitor 10/26/2022 6. Chronic right Sided Thoracic Back Pain: Continue current medication regime. Continue HEP as tolerated. Continue to monitor. 10/26/2022    F/U in 3 months

## 2022-10-30 LAB — TOXASSURE SELECT,+ANTIDEPR,UR

## 2022-10-31 ENCOUNTER — Telehealth: Payer: Self-pay | Admitting: *Deleted

## 2022-10-31 NOTE — Telephone Encounter (Signed)
[facility] [address] [facility] not [facility] 10 mg in stock. Per the patient they have the 5 mg. She would like rx sent to [facility] [address] acetaminophen-codeine (TYLENOL #4) 300-60 MG tablet [id number]   Order Details Dose: 1 tablet Route: Oral Frequency: Daily PRN for moderate pain  Dispense Quantity: 30 tablet Refills: 2        Sig: Take 1 tablet by mouth daily as needed for moderate pain.       Start Date: 10/26/22 End Date: --  Written Date: 10/26/22 Expiration Date: 04/24/23  Original Order: acetaminophen-codeine (TYLENOL #4) 300-60 MG tablet [id number]

## 2022-10-31 NOTE — Telephone Encounter (Signed)
Patient states Tylenol # 3 is out of stock and not Hydrocodone.

## 2022-10-31 NOTE — Telephone Encounter (Signed)
Urine drug screen for this encounter is consistent for prescribed medication 

## 2022-11-01 MED ORDER — ACETAMINOPHEN-CODEINE 300-60 MG PO TABS: 1.0000 | ORAL_TABLET | Freq: Every day | ORAL | 2 refills | Status: DC | PRN

## 2022-11-01 NOTE — Telephone Encounter (Signed)
PMP was Reviewed.  Tylenol #4 e-scribed to pharmacy.  Ms. Dipirro is aware via My- Chart message.

## 2022-11-28 ENCOUNTER — Telehealth: Payer: Self-pay

## 2022-11-28 MED ORDER — ACETAMINOPHEN-CODEINE 300-60 MG PO TABS: 1.0000 | ORAL_TABLET | Freq: Every day | ORAL | 2 refills | Status: DC | PRN

## 2022-11-28 NOTE — Telephone Encounter (Signed)
Please re-direct the Tylenol # 4. Please send to [facility] [address]  [facility] do not have it in stock.  Prior Rx has been cancelled.   Thank you.

## 2022-11-28 NOTE — Telephone Encounter (Signed)
PMP was Reviewed.  T&C prescription sent to pharmacy.  Susan Cortez is aware via My-Chart message.

## 2023-01-09 ENCOUNTER — Encounter: Payer: Self-pay | Admitting: Medical Oncology

## 2023-01-09 ENCOUNTER — Encounter: Payer: Self-pay | Admitting: Internal Medicine

## 2023-01-23 ENCOUNTER — Encounter: Payer: 59 | Attending: Registered Nurse | Admitting: Registered Nurse

## 2023-01-23 ENCOUNTER — Encounter: Payer: Self-pay | Admitting: Registered Nurse

## 2023-01-23 VITALS — BP 121/79 | HR 104 | Ht 68.5 in | Wt 188.0 lb

## 2023-01-23 DIAGNOSIS — R000 Tachycardia, unspecified: Secondary | ICD-10-CM

## 2023-01-23 DIAGNOSIS — M546 Pain in thoracic spine: Secondary | ICD-10-CM | POA: Insufficient documentation

## 2023-01-23 DIAGNOSIS — M4802 Spinal stenosis, cervical region: Secondary | ICD-10-CM | POA: Insufficient documentation

## 2023-01-23 DIAGNOSIS — Z5181 Encounter for therapeutic drug level monitoring: Secondary | ICD-10-CM | POA: Insufficient documentation

## 2023-01-23 DIAGNOSIS — G8929 Other chronic pain: Secondary | ICD-10-CM

## 2023-01-23 DIAGNOSIS — M542 Cervicalgia: Secondary | ICD-10-CM | POA: Insufficient documentation

## 2023-01-23 DIAGNOSIS — G894 Chronic pain syndrome: Secondary | ICD-10-CM | POA: Diagnosis not present

## 2023-01-23 DIAGNOSIS — Z79891 Long term (current) use of opiate analgesic: Secondary | ICD-10-CM | POA: Diagnosis not present

## 2023-01-23 MED ORDER — ACETAMINOPHEN-CODEINE 300-60 MG PO TABS: 1.0000 | ORAL_TABLET | Freq: Every day | ORAL | 2 refills | Status: DC | PRN

## 2023-01-23 NOTE — Progress Notes (Signed)
Subjective:    Patient ID: Susan Cortez, female    DOB: [birth year], 62 y.o.   MRN: [id number]  HPI: Susan Cortez is a 62 y.o. female who returns for follow up appointment for chronic pain and medication refill. She states her pain is located in her neck and upper back. She rates her pain 6. Her current exercise regime is walking and performing stretching exercises.  Ms. Pelowski Morphine equivalent is 9.00 MME.   Last UDS was Performed on 10/26/2022, it was consistent.     Pain Inventory Average Pain 7 Pain Right Now 6 My pain is constant, sharp, stabbing, and aching  In the last 24 hours, has pain interfered with the following? General activity 1 Relation with others 0 Enjoyment of life 1 What TIME of day is your pain at its worst? daytime and night Sleep (in general) Poor  Pain is worse with: bending, standing, and some activites Pain improves with: rest, pacing activities, and medication Relief from Meds: 7  Family History  Problem Relation Age of Onset   Diabetes Father    Heart disease Father    Hyperlipidemia Father    Colon cancer Neg Hx    Colon polyps Neg Hx    Esophageal cancer Neg Hx    Rectal cancer Neg Hx    Stomach cancer Neg Hx    Social History   Socioeconomic History   Marital status: Divorced    Spouse name: Not on file   Number of children: Not on file   Years of education: Not on file   Highest education level: Not on file  Occupational History   Not on file  Tobacco Use   Smoking status: Every Day    Packs/day: 1.00    Years: 45.00    Additional pack years: 0.00    Total pack years: 45.00    Types: Cigarettes   Smokeless tobacco: Never   Tobacco comments:    1 pack smoked per day ARJ 09/19/22  Vaping Use   Vaping Use: Former  Substance and Sexual Activity   Alcohol use: No   Drug use: No   Sexual activity: Not on file    Comment: Tubal Ligation  Other Topics Concern   Not on file  Social History Narrative   Not on file    Social Determinants of Health   Financial Resource Strain: Low Risk  (04/18/2021)   Overall Financial Resource Strain (CARDIA)    Difficulty of Paying Living Expenses: Not hard at all  Food Insecurity: No Food Insecurity (04/18/2021)   Hunger Vital Sign    Worried About Running Out of Food in the Last Year: Never true    Ran Out of Food in the Last Year: Never true  Transportation Needs: No Transportation Needs (04/18/2021)   PRAPARE - Administrator, Civil Service (Medical): No    Lack of Transportation (Non-Medical): No  Physical Activity: Inactive (04/18/2021)   Exercise Vital Sign    Days of Exercise per Week: 0 days    Minutes of Exercise per Session: 0 min  Stress: Stress Concern Present (04/18/2021)   [employer] of Occupational Health - Occupational Stress Questionnaire    Feeling of Stress : To some extent  Social Connections: Socially Isolated (04/18/2021)   Social Connection and Isolation Panel [NHANES]    Frequency of Communication with Friends and Family: More than three times a week    Frequency of Social Gatherings with Friends and Family: More than  three times a week    Attends Religious Services: Never    Active Member of Clubs or Organizations: No    Attends Banker Meetings: Never    Marital Status: Divorced   Past Surgical History:  Procedure Laterality Date   CESAREAN SECTION     X 3   COLONOSCOPY  2013, 2016, 2019   hx colon polyps   DILATION AND CURETTAGE OF UTERUS     FUDUCIAL PLACEMENT Right 10/18/2020   Procedure: PLACEMENT OF FUDUCIAL;  Surgeon: Leslye Peer, MD;  [facility];  Service: Thoracic;  Laterality: Right;   IR IMAGING GUIDED PORT INSERTION  11/29/2020   IR REMOVAL TUN ACCESS W/ PORT W/O FL MOD SED  05/08/2022   SHOULDER ARTHROSCOPY WITH OPEN ROTATOR CUFF REPAIR Right 05/2009   TONSILLECTOMY  1977   TUBAL LIGATION     VIDEO BRONCHOSCOPY WITH ENDOBRONCHIAL NAVIGATION N/A 10/18/2020   Procedure: VIDEO  BRONCHOSCOPY WITH ENDOBRONCHIAL NAVIGATION;  Surgeon: Leslye Peer, MD;  [facility];  Service: Thoracic;  Laterality: N/A;   VIDEO BRONCHOSCOPY WITH ENDOBRONCHIAL ULTRASOUND N/A 10/18/2020   Procedure: VIDEO BRONCHOSCOPY WITH ENDOBRONCHIAL ULTRASOUND;  Surgeon: Leslye Peer, MD;  [facility];  Service: Thoracic;  Laterality: N/A;   WISDOM TOOTH EXTRACTION     Past Surgical History:  Procedure Laterality Date   CESAREAN SECTION     X 3   COLONOSCOPY  2013, 2016, 2019   hx colon polyps   DILATION AND CURETTAGE OF UTERUS     FUDUCIAL PLACEMENT Right 10/18/2020   Procedure: PLACEMENT OF FUDUCIAL;  Surgeon: Leslye Peer, MD;  [facility];  Service: Thoracic;  Laterality: Right;   IR IMAGING GUIDED PORT INSERTION  11/29/2020   IR REMOVAL TUN ACCESS W/ PORT W/O FL MOD SED  05/08/2022   SHOULDER ARTHROSCOPY WITH OPEN ROTATOR CUFF REPAIR Right 05/2009   TONSILLECTOMY  1977   TUBAL LIGATION     VIDEO BRONCHOSCOPY WITH ENDOBRONCHIAL NAVIGATION N/A 10/18/2020   Procedure: VIDEO BRONCHOSCOPY WITH ENDOBRONCHIAL NAVIGATION;  Surgeon: Leslye Peer, MD;  [facility];  Service: Thoracic;  Laterality: N/A;   VIDEO BRONCHOSCOPY WITH ENDOBRONCHIAL ULTRASOUND N/A 10/18/2020   Procedure: VIDEO BRONCHOSCOPY WITH ENDOBRONCHIAL ULTRASOUND;  Surgeon: Leslye Peer, MD;  [facility];  Service: Thoracic;  Laterality: N/A;   WISDOM TOOTH EXTRACTION     Past Medical History:  Diagnosis Date   Allergy    Arthritis    Basal cell carcinoma 2018   Removed from Right neck per patient    Diverticulosis 2013   Dyslipidemia 03/01/2018   HLD (hyperlipidemia)    Hx of adenomatous colonic polyps 2013   OAB (overactive bladder)    Pre-diabetes    Small cell lung cancer ([facility]) 11/09/2020   BP 121/79   Pulse (!) 114   Ht 5' 8.5" (1.74 m)   Wt 188 lb (85.3 kg)   LMP  (LMP Unknown)   SpO2 96%   BMI 28.17 kg/m   Opioid Risk Score:   Fall Risk Score:  `1  Depression screen [facility] 2/9      10/26/2022   10:35 AM 09/21/2022    3:19 PM 09/07/2022    9:26 AM 06/18/2022    8:28 AM 04/19/2022    1:06 PM 01/24/2022   10:47 AM 10/25/2021   10:27 AM  Depression screen PHQ 2/9  Decreased Interest 0 0 0 0 0 0 0  Down, Depressed, Hopeless 0 0 0  0 0 0 0  PHQ - 2 Score 0 0 0 0 0 0 0     Review of Systems  Musculoskeletal:  Positive for neck pain.       RT shoulder  All other systems reviewed and are negative.      Objective:   Physical Exam Vitals and nursing note reviewed.  Constitutional:      Appearance: Normal appearance.  Cardiovascular:     Rate and Rhythm: Normal rate and regular rhythm.     Pulses: Normal pulses.     Heart sounds: Normal heart sounds.  Pulmonary:     Effort: Pulmonary effort is normal.     Breath sounds: Normal breath sounds.  Musculoskeletal:     Cervical back: Normal range of motion and neck supple.     Comments: Normal Muscle Bulk and Muscle Testing Reveals:  Upper Extremities: Full ROM and Muscle Strength 5/5  Lower Extremities: Full ROM and Muscle Strength 5/5 Arises from Table with Ease Narrow Based  Gait     Skin:    General: Skin is warm and dry.  Neurological:     Mental Status: She is alert and oriented to person, place, and time.  Psychiatric:        Mood and Affect: Mood normal.        Behavior: Behavior normal.         Assessment & Plan:  1.Cervical spinal stenosis without evidence of radiculopathy/ Cervical Radiculitis . Chronic neck pain: Continue to Monitor.  Refilled Tylenol #4  300/60mg  daily, #30. 01/23/2023. We will continue the opioid monitoring program, this consists of regular clinic visits, examinations, urine drug screen, pill counts as well as use of [place] Controlled Substance Reporting system. A 12 month History has been reviewed on the [place] Controlled Substance Reporting System on 01/23/2023. 2. Right shoulder rotator cuff tear, status post repair with chronic pain and contracture : Continue with  exercise regime as tolerated .01/23/2023. 3. Insomnia: Continue Melatonin. 01/23/2023 4. Chronic Bilateral Lower Back Pain without Sciatica: No complaints today. Continue HEP as tolerated. Continue to Monitor. 01/23/2023  5. Right Greater Trochanter Bursitis: No complaints Today. Continue to Alternate Ice and Heat Therapy. Continue to Monitor 01/23/2023 6. Chronic Thoracic Back Pain: Continue current medication regime. Continue HEP as tolerated. Continue to monitor. 01/23/2023 7. Tachycardia: Compliant with medication. Apical Pulse was checked. PCP following. Continue to Monitor.   F/U in 3 months

## 2023-01-24 ENCOUNTER — Ambulatory Visit
Admission: RE | Admit: 2023-01-24 | Discharge: 2023-01-24 | Disposition: A | Discharge status: Home / Self Care | Payer: 59 | Source: Ambulatory Visit | Attending: Radiation Oncology | Admitting: Radiation Oncology

## 2023-01-24 DIAGNOSIS — C7931 Secondary malignant neoplasm of brain: Secondary | ICD-10-CM | POA: Diagnosis not present

## 2023-01-24 MED ORDER — GADOPICLENOL 0.5 MMOL/ML IV SOLN
8.0000 mL | Freq: Once | INTRAVENOUS | Status: AC | PRN
  Administered 2023-01-24: 8 mL via INTRAVENOUS

## 2023-01-25 ENCOUNTER — Encounter: Payer: Self-pay | Admitting: Radiation Oncology

## 2023-01-25 NOTE — Progress Notes (Signed)
Telephone nursing appointment for patient to review most recent scan results from 01/25/2023. I verified patient's identity and began nursing interview. Patient reports doing well. She is having pain in her neck and shoulders 7/10, but this issue pre dates DX. No other issues conveyed at this time.   Meaningful use complete.   Patient aware of their 1:00pm- 01/28/2023 telephone appointment w/ Laurence Aly PA-C. I left my extension [phone] in case patient needs anything. Patient verbalized understanding. This concludes the nursing interview.   Patient contact [phone]     Ruel Favors, LPN

## 2023-01-28 ENCOUNTER — Ambulatory Visit
Admission: RE | Admit: 2023-01-28 | Discharge: 2023-01-28 | Disposition: A | Discharge status: Home / Self Care | Payer: 59 | Source: Ambulatory Visit | Attending: Radiation Oncology | Admitting: Radiation Oncology

## 2023-01-28 ENCOUNTER — Other Ambulatory Visit: Payer: Self-pay | Admitting: Radiation Therapy

## 2023-01-28 ENCOUNTER — Inpatient Hospital Stay: Payer: 59

## 2023-01-28 VITALS — Ht 68.5 in | Wt 188.0 lb

## 2023-01-28 DIAGNOSIS — C3491 Malignant neoplasm of unspecified part of right bronchus or lung: Secondary | ICD-10-CM

## 2023-01-28 DIAGNOSIS — C7931 Secondary malignant neoplasm of brain: Secondary | ICD-10-CM

## 2023-01-28 DIAGNOSIS — F17210 Nicotine dependence, cigarettes, uncomplicated: Secondary | ICD-10-CM | POA: Diagnosis not present

## 2023-01-28 DIAGNOSIS — C3411 Malignant neoplasm of upper lobe, right bronchus or lung: Secondary | ICD-10-CM | POA: Diagnosis not present

## 2023-01-28 NOTE — Progress Notes (Signed)
Radiation Oncology         [phone] ________________________________  Outpatient Follow Up - Conducted via telephone at patient request.  I spoke with the patient to conduct this consult visit via telephone. The patient was notified in advance and was offered an in person or telemedicine meeting to allow for face to face communication but instead preferred to proceed with a telephone visit. ________________________________  Name: Susan Cortez        MRN: [id number]  Date of Service: 01/28/2023 DOB: [birth year]  [id number], Gwenlyn Found, MD  Copland, Gwenlyn Found, MD     REFERRING PHYSICIAN: Copland, Gwenlyn Found, MD   DIAGNOSIS: The encounter diagnosis was Small cell carcinoma of right lung (HCC).   HISTORY OF PRESENT ILLNESS: Susan Cortez is a 62 y.o. female with a history of limited stage small cell carcinoma of the right upper lobe involving suprahilar nodule/adenopathy and right paractracheal adenopathy. A biopsy and cytology on 10/18/20 showed malignant cells in the right upper lobe brushings and fine-needle aspirate, atypical cells were seen in the 11 R node by fine-needle aspiration and malignant cells consistent with small cell carcinoma were seen in the level 4R and 10 R nodes as well.  She received chemoradiation which she completed on  01/04/21.   She was counseled on prophylactic cranial irradiation and prior to treatment had a negative MRI of the brain. She did have a lesion in the calvarium that was originally read as possibly metastatic, but after further discussion with neuroradiology, this was felt to be a benign appearing hemangioma and over time has been stable. She received whole brain radiation which she completed on 03/28/21. She completed Namenda in January 2023. She continues in surveillance with Dr. Arbutus Ped.  Her most recent MRI of the brain on 01/23/2022 showed stable findings in the calvarium consistent with known hemangioma and no evidence of intracranial metastatic  disease.  She is contacted today to discuss these results.    REVIEW OF SYSTEMS: On review of systems the patient reports she is doing well overall. She does have some muffled hearing at times to background noises especially when watching TV.  She reports occasional headaches. These do not seem to be as regular as previously. No verbalized nausea, vomiting, unintended changes in movement, or confusion are noted. No other complaints are verbalized.    PREVIOUS RADIATION THERAPY:   03/15/2021 through 03/28/2021 Site Technique Total Dose (Gy) Dose per Fx (Gy) Completed Fx Beam Energies  Brain:  Prophylactic Whole Brain Complex 25/25 2.5 10/10 6X   11/21/2020 through 01/04/2021 Site Technique Total Dose (Gy) Dose per Fx (Gy) Completed Fx Beam Energies  Lung, Right: Lung_Rt 3D 60/60 2 30/30 6X, 10X  Lung, Right: Lung_Rt_Bst 3D 6/6 2 3/3 6X, 10X    PAST MEDICAL HISTORY:  Past Medical History:  Diagnosis Date   Allergy    Arthritis    Basal cell carcinoma 2018   Removed from Right neck per patient    Diverticulosis 2013   Dyslipidemia 03/01/2018   HLD (hyperlipidemia)    Hx of adenomatous colonic polyps 2013   OAB (overactive bladder)    Pre-diabetes    Small cell lung cancer (HCC) 11/09/2020       PAST SURGICAL HISTORY: Past Surgical History:  Procedure Laterality Date   CESAREAN SECTION     X 3   COLONOSCOPY  2013, 2016, 2019   hx colon polyps   DILATION AND CURETTAGE OF UTERUS     FUDUCIAL PLACEMENT Right  10/18/2020   Procedure: PLACEMENT OF FUDUCIAL;  Surgeon: Leslye Peer, MD;  Location: [facility];  Service: Thoracic;  Laterality: Right;   IR IMAGING GUIDED PORT INSERTION  11/29/2020   IR REMOVAL TUN ACCESS W/ PORT W/O FL MOD SED  05/08/2022   SHOULDER ARTHROSCOPY WITH OPEN ROTATOR CUFF REPAIR Right 05/2009   TONSILLECTOMY  1977   TUBAL LIGATION     VIDEO BRONCHOSCOPY WITH ENDOBRONCHIAL NAVIGATION N/A 10/18/2020   Procedure: VIDEO BRONCHOSCOPY WITH ENDOBRONCHIAL NAVIGATION;   Surgeon: Leslye Peer, MD;  Location: [facility] OR;  Service: Thoracic;  Laterality: N/A;   VIDEO BRONCHOSCOPY WITH ENDOBRONCHIAL ULTRASOUND N/A 10/18/2020   Procedure: VIDEO BRONCHOSCOPY WITH ENDOBRONCHIAL ULTRASOUND;  Surgeon: Leslye Peer, MD;  Location: [facility] OR;  Service: Thoracic;  Laterality: N/A;   WISDOM TOOTH EXTRACTION       FAMILY HISTORY:  Family History  Problem Relation Age of Onset   Diabetes Father    Heart disease Father    Hyperlipidemia Father    Colon cancer Neg Hx    Colon polyps Neg Hx    Esophageal cancer Neg Hx    Rectal cancer Neg Hx    Stomach cancer Neg Hx      SOCIAL HISTORY:  reports that she has been smoking cigarettes. She has a 45.00 pack-year smoking history. She has never used smokeless tobacco. She reports that she does not drink alcohol and does not use drugs. The patient is divorced and lives in [place]. She is originally from [place] and enjoys traveling home to [place] periodically to see friends and family.     ALLERGIES: Lyrica [pregabalin], Butrans [buprenorphine], Chantix [varenicline], Diclofenac, Doxycycline, Meloxicam, Tolmetin, and Nsaids   MEDICATIONS:  Current Outpatient Medications  Medication Sig Dispense Refill   acetaminophen-codeine (TYLENOL #4) 300-60 MG tablet Take 1 tablet by mouth daily as needed for moderate pain. 30 tablet 2   CALCIUM PO Take 650 mg by mouth daily.     Cholecalciferol (VITAMIN D3) 125 MCG (5000 UT) CAPS Take 5,000 Units by mouth daily.     Cyanocobalamin (B-12) 100 MCG TABS      esomeprazole (NEXIUM) 40 MG capsule Take 1 capsule (40 mg total) by mouth daily at 12 noon. 30 capsule 11   fluticasone (FLONASE) 50 MCG/ACT nasal spray Place 2 sprays into both nostrils daily. 16 g 6   LORazepam (ATIVAN) 1 MG tablet TAKE 0.5 TABLET BY MOUTH EVERY 8 HOURS. TAKE 1 TABLET 30 MINUTES PRIOR TO MRI OR RADIATION 14 tablet 0   lovastatin (MEVACOR) 20 MG tablet TAKE 1 TABLET BY MOUTH EVERYDAY AT BEDTIME 90 tablet 1    metoprolol succinate (TOPROL-XL) 25 MG 24 hr tablet TAKE 1/2 TABLET BY MOUTH EVERY DAY 45 tablet 3   Multiple Vitamins-Minerals (MULTIVITAMIN ADULT, MINERALS,) TABS      triamcinolone cream (KENALOG) 0.1 % SMARTSIG:liberally Topical Twice Daily     No current facility-administered medications for this encounter.        PHYSICAL EXAM:  Unable to assess  due to encounter type.    ECOG = 1  0 - Asymptomatic (Fully active, able to carry on all predisease activities without restriction)  1 - Symptomatic but completely ambulatory (Restricted in physically strenuous activity but ambulatory and able to carry out work of a light or sedentary nature. For example, light housework, office work)  2 - Symptomatic, <50% in bed during the day (Ambulatory and capable of all self care but unable to carry out any  work activities. Up and about more than 50% of waking hours)  3 - Symptomatic, >50% in bed, but not bedbound (Capable of only limited self-care, confined to bed or chair 50% or more of waking hours)  4 - Bedbound (Completely disabled. Cannot carry on any self-care. Totally confined to bed or chair)  5 - Death   Santiago Glad MM, Creech RH, Tormey DC, et al. [phone]). "Toxicity and response criteria of the [facility]". Am. Evlyn Clines. Oncol. 5 (6): 649-55    LABORATORY DATA:  Lab Results  Component Value Date   WBC 7.9 09/17/2022   HGB 14.7 09/17/2022   HCT 43.4 09/17/2022   MCV 92.9 09/17/2022   PLT 176 09/17/2022   Lab Results  Component Value Date   NA 137 09/17/2022   K 3.8 09/17/2022   CL 103 09/17/2022   CO2 27 09/17/2022   Lab Results  Component Value Date   ALT 22 09/17/2022   AST 22 09/17/2022   ALKPHOS 50 09/17/2022   BILITOT 0.5 09/17/2022      RADIOGRAPHY: MR Brain W Wo Contrast  Result Date: 01/25/2023 CLINICAL DATA:  Brain metastases, assess treatment response EXAM: MRI HEAD WITHOUT AND WITH CONTRAST TECHNIQUE: Multiplanar, multiecho pulse  sequences of the brain and surrounding structures were obtained without and with intravenous contrast. CONTRAST:  8 cc of Vueway intravenous COMPARISON:  07/26/2022 FINDINGS: Brain: No enhancement or swelling to suggest metastatic disease. Cluster of small chronic right cerebellar infarcts. Mild chronic small vessel ischemia in the cerebral white matter. No hydrocephalus, collection, or hemorrhage. Vascular: Major flow voids and vascular enhancements are preserved Skull and upper cervical spine: Stable left parietal lesion best seen on STIR imaging and measuring 3.1 cm in diameter. Sinuses/Orbits: Negative IMPRESSION: No evidence of intracranial metastasis. Stable left parietal bone lesion. Electronically Signed   By: Tiburcio Pea M.D.   On: 01/25/2023 09:33        IMPRESSION/PLAN: 1. Limited Stage Small Cell Carcinoma of the RUL. The patient continues to be radiographically withtout disease in the brain and by CT imaging as well systemically. She continues to follow in surveillance with Dr. Arbutus Ped, and we reviewed continued MRI surveillance of her brain. She continues to be motivated to continue at a 6 month schedules for MRI surveillance. She will also follow up with Dr. Asa Lente team as scheduled.  2. Left Parietal Calvarial Hemangioma. This finding continues to be stable and is followed expectantly and felt to be benign. 3. Muffled hearing. Her last three MRIs did not call any mastoid changes, but we discussed evaluation with her PCP to rule out cerumen impaction, and possible referral to ENT for audiology evaluation. This will be followed expectantly.    This encounter was conducted via telephone.  The patient has provided two factor identification and has given verbal consent for this type of encounter and has been advised to only accept a meeting of this type in a secure network environment. The time spent during this encounter was 30 minutes including preparation, discussion, and  coordination of the patient's care. The attendants for this meeting include  Ronny Bacon  and Janeann Forehand.  During the encounter,  Ronny Bacon was located remotely. Susan Cortez was located at home.        Osker Mason, [facility]   **Disclaimer: This note was dictated with voice recognition software. Similar sounding words can inadvertently be transcribed and this note may contain transcription errors which may not have  been corrected upon publication of note.**

## 2023-02-04 ENCOUNTER — Other Ambulatory Visit (HOSPITAL_BASED_OUTPATIENT_CLINIC_OR_DEPARTMENT_OTHER): Payer: Self-pay | Admitting: Family Medicine

## 2023-02-04 ENCOUNTER — Encounter: Payer: Self-pay | Admitting: Family Medicine

## 2023-02-04 DIAGNOSIS — H9190 Unspecified hearing loss, unspecified ear: Secondary | ICD-10-CM

## 2023-02-04 DIAGNOSIS — Z1231 Encounter for screening mammogram for malignant neoplasm of breast: Secondary | ICD-10-CM

## 2023-02-04 NOTE — Addendum Note (Signed)
Addended by: Pearline Cables on: 02/04/2023 08:49 PM   Modules accepted: Orders

## 2023-02-07 ENCOUNTER — Telehealth: Payer: Self-pay

## 2023-02-07 NOTE — Patient Outreach (Signed)
  Care Coordination   Initial Visit Note   02/07/2023 Name: Susan Cortez MRN: [id number] DOB: 12/30/1960  Susan Cortez is a 62 y.o. year old female who sees Copland, Gwenlyn Found, MD for primary care. I spoke with  Susan Cortez by phone today.  What matters to the patients health and wellness today?  Susan Cortez denies any care coordination, disease management or resource needs at this time. Patient to contact PCP if care coordination needs in the future.   Goals Addressed             This Visit's Progress    COMPLETED: Care Coordination Activities       Interventions Today    Flowsheet Row Most Recent Value  General Interventions   General Interventions Discussed/Reviewed General Interventions Discussed  [discussed care coordination program. encouraged patient to contact primary care provider if care coordination needs in the future.]  Pharmacy Interventions   Pharmacy Dicussed/Reviewed Pharmacy Topics Discussed  [medications reviewed]            SDOH assessments and interventions completed:  Yes  SDOH Interventions Today    Flowsheet Row Most Recent Value  SDOH Interventions   Housing Interventions Intervention Not Indicated  Transportation Interventions Intervention Not Indicated  Utilities Interventions Intervention Not Indicated     Care Coordination Interventions:  Yes, provided   Follow up plan: No further intervention required.   Encounter Outcome:  Pt. Visit Completed   Kathyrn Sheriff, RN, MSN, BSN, CCM [facility] Care Coordinator [phone]

## 2023-02-12 ENCOUNTER — Other Ambulatory Visit: Payer: Self-pay | Admitting: Family Medicine

## 2023-02-12 DIAGNOSIS — E785 Hyperlipidemia, unspecified: Secondary | ICD-10-CM

## 2023-02-27 DIAGNOSIS — Z85828 Personal history of other malignant neoplasm of skin: Secondary | ICD-10-CM | POA: Diagnosis not present

## 2023-02-27 DIAGNOSIS — L218 Other seborrheic dermatitis: Secondary | ICD-10-CM | POA: Diagnosis not present

## 2023-03-02 ENCOUNTER — Other Ambulatory Visit: Payer: Self-pay | Admitting: Family

## 2023-03-11 ENCOUNTER — Encounter: Payer: Self-pay | Admitting: Internal Medicine

## 2023-03-19 ENCOUNTER — Inpatient Hospital Stay: Payer: 59 | Attending: Internal Medicine

## 2023-03-19 ENCOUNTER — Other Ambulatory Visit: Payer: Self-pay

## 2023-03-19 DIAGNOSIS — Z923 Personal history of irradiation: Secondary | ICD-10-CM | POA: Insufficient documentation

## 2023-03-19 DIAGNOSIS — E785 Hyperlipidemia, unspecified: Secondary | ICD-10-CM | POA: Insufficient documentation

## 2023-03-19 DIAGNOSIS — Z9221 Personal history of antineoplastic chemotherapy: Secondary | ICD-10-CM | POA: Diagnosis not present

## 2023-03-19 DIAGNOSIS — Z79899 Other long term (current) drug therapy: Secondary | ICD-10-CM | POA: Diagnosis not present

## 2023-03-19 DIAGNOSIS — Z86010 Personal history of colonic polyps: Secondary | ICD-10-CM | POA: Insufficient documentation

## 2023-03-19 DIAGNOSIS — C3490 Malignant neoplasm of unspecified part of unspecified bronchus or lung: Secondary | ICD-10-CM

## 2023-03-19 DIAGNOSIS — M129 Arthropathy, unspecified: Secondary | ICD-10-CM | POA: Diagnosis not present

## 2023-03-19 DIAGNOSIS — Z85828 Personal history of other malignant neoplasm of skin: Secondary | ICD-10-CM | POA: Insufficient documentation

## 2023-03-19 DIAGNOSIS — C3411 Malignant neoplasm of upper lobe, right bronchus or lung: Secondary | ICD-10-CM | POA: Insufficient documentation

## 2023-03-19 LAB — CBC WITH DIFFERENTIAL (CANCER CENTER ONLY)
Abs Immature Granulocytes: 0.02 10*3/uL (ref 0.00–0.07)
Basophils Absolute: 0.1 10*3/uL (ref 0.0–0.1)
Basophils Relative: 1 %
Eosinophils Absolute: 0.1 10*3/uL (ref 0.0–0.5)
Eosinophils Relative: 2 %
HCT: 43.3 % (ref 36.0–46.0)
Hemoglobin: 14.5 g/dL (ref 12.0–15.0)
Immature Granulocytes: 0 %
Lymphocytes Relative: 23 %
Lymphs Abs: 1.7 10*3/uL (ref 0.7–4.0)
MCH: 31.7 pg (ref 26.0–34.0)
MCHC: 33.5 g/dL (ref 30.0–36.0)
MCV: 94.5 fL (ref 80.0–100.0)
Monocytes Absolute: 0.6 10*3/uL (ref 0.1–1.0)
Monocytes Relative: 8 %
Neutro Abs: 4.9 10*3/uL (ref 1.7–7.7)
Neutrophils Relative %: 66 %
Platelet Count: 330 10*3/uL (ref 150–400)
RBC: 4.58 MIL/uL (ref 3.87–5.11)
RDW: 14.1 % (ref 11.5–15.5)
WBC Count: 7.4 10*3/uL (ref 4.0–10.5)
nRBC: 0 % (ref 0.0–0.2)

## 2023-03-19 LAB — CMP (CANCER CENTER ONLY)
ALT: 22 U/L (ref 0–44)
AST: 23 U/L (ref 15–41)
Albumin: 4.4 g/dL (ref 3.5–5.0)
Alkaline Phosphatase: 50 U/L (ref 38–126)
Anion gap: 6 (ref 5–15)
BUN: 9 mg/dL (ref 8–23)
CO2: 28 mmol/L (ref 22–32)
Calcium: 9.8 mg/dL (ref 8.9–10.3)
Chloride: 105 mmol/L (ref 98–111)
Creatinine: 0.8 mg/dL (ref 0.44–1.00)
GFR, Estimated: >60 mL/min (ref >60)
Glucose, Bld: 84 mg/dL (ref 70–99)
Potassium: 4.2 mmol/L (ref 3.5–5.1)
Sodium: 139 mmol/L (ref 135–145)
Total Bilirubin: 0.4 mg/dL (ref 0.3–1.2)
Total Protein: 6.9 g/dL (ref 6.5–8.1)

## 2023-03-20 ENCOUNTER — Telehealth: Payer: Self-pay | Admitting: Internal Medicine

## 2023-03-20 ENCOUNTER — Ambulatory Visit (HOSPITAL_COMMUNITY)
Admission: RE | Admit: 2023-03-20 | Discharge: 2023-03-20 | Disposition: A | Discharge status: Home / Self Care | Payer: 59 | Source: Ambulatory Visit | Attending: Internal Medicine | Admitting: Internal Medicine

## 2023-03-20 DIAGNOSIS — J432 Centrilobular emphysema: Secondary | ICD-10-CM | POA: Diagnosis not present

## 2023-03-20 DIAGNOSIS — E041 Nontoxic single thyroid nodule: Secondary | ICD-10-CM | POA: Diagnosis not present

## 2023-03-20 DIAGNOSIS — I700 Atherosclerosis of aorta: Secondary | ICD-10-CM | POA: Diagnosis not present

## 2023-03-20 DIAGNOSIS — R0982 Postnasal drip: Secondary | ICD-10-CM | POA: Diagnosis not present

## 2023-03-20 DIAGNOSIS — C3490 Malignant neoplasm of unspecified part of unspecified bronchus or lung: Secondary | ICD-10-CM | POA: Insufficient documentation

## 2023-03-20 DIAGNOSIS — H903 Sensorineural hearing loss, bilateral: Secondary | ICD-10-CM | POA: Diagnosis not present

## 2023-03-20 DIAGNOSIS — R0981 Nasal congestion: Secondary | ICD-10-CM | POA: Diagnosis not present

## 2023-03-20 DIAGNOSIS — H9190 Unspecified hearing loss, unspecified ear: Secondary | ICD-10-CM | POA: Diagnosis not present

## 2023-03-20 MED ORDER — SODIUM CHLORIDE (PF) 0.9 % IJ SOLN
INTRAMUSCULAR | Status: AC
  Filled 2023-03-20: qty 50

## 2023-03-20 MED ORDER — IOHEXOL 300 MG/ML  SOLN
75.0000 mL | Freq: Once | INTRAMUSCULAR | Status: AC | PRN
  Administered 2023-03-20: 75 mL via INTRAVENOUS

## 2023-03-20 NOTE — Telephone Encounter (Signed)
Rescheduled 07/25 appointment to 07/29 due to scheduling conflict. Patient is notified.

## 2023-03-21 ENCOUNTER — Ambulatory Visit (HOSPITAL_BASED_OUTPATIENT_CLINIC_OR_DEPARTMENT_OTHER)
Admission: RE | Admit: 2023-03-21 | Discharge: 2023-03-21 | Disposition: A | Discharge status: Home / Self Care | Payer: 59 | Source: Ambulatory Visit | Attending: Family Medicine | Admitting: Family Medicine

## 2023-03-21 ENCOUNTER — Encounter (HOSPITAL_BASED_OUTPATIENT_CLINIC_OR_DEPARTMENT_OTHER): Payer: Self-pay

## 2023-03-21 ENCOUNTER — Inpatient Hospital Stay: Payer: 59 | Admitting: Internal Medicine

## 2023-03-21 DIAGNOSIS — Z1231 Encounter for screening mammogram for malignant neoplasm of breast: Secondary | ICD-10-CM | POA: Diagnosis not present

## 2023-03-25 ENCOUNTER — Inpatient Hospital Stay (HOSPITAL_BASED_OUTPATIENT_CLINIC_OR_DEPARTMENT_OTHER): Payer: 59 | Admitting: Internal Medicine

## 2023-03-25 ENCOUNTER — Other Ambulatory Visit: Payer: Self-pay

## 2023-03-25 VITALS — BP 102/70 | HR 104 | Temp 98.1°F | Resp 17 | Ht 68.5 in | Wt 191.1 lb

## 2023-03-25 DIAGNOSIS — Z86010 Personal history of colonic polyps: Secondary | ICD-10-CM | POA: Diagnosis not present

## 2023-03-25 DIAGNOSIS — M129 Arthropathy, unspecified: Secondary | ICD-10-CM | POA: Diagnosis not present

## 2023-03-25 DIAGNOSIS — C3490 Malignant neoplasm of unspecified part of unspecified bronchus or lung: Secondary | ICD-10-CM

## 2023-03-25 DIAGNOSIS — Z79899 Other long term (current) drug therapy: Secondary | ICD-10-CM | POA: Diagnosis not present

## 2023-03-25 DIAGNOSIS — Z85828 Personal history of other malignant neoplasm of skin: Secondary | ICD-10-CM | POA: Diagnosis not present

## 2023-03-25 DIAGNOSIS — C3411 Malignant neoplasm of upper lobe, right bronchus or lung: Secondary | ICD-10-CM | POA: Diagnosis not present

## 2023-03-25 DIAGNOSIS — E785 Hyperlipidemia, unspecified: Secondary | ICD-10-CM | POA: Diagnosis not present

## 2023-03-25 DIAGNOSIS — Z9221 Personal history of antineoplastic chemotherapy: Secondary | ICD-10-CM | POA: Diagnosis not present

## 2023-03-25 DIAGNOSIS — Z923 Personal history of irradiation: Secondary | ICD-10-CM | POA: Diagnosis not present

## 2023-03-25 NOTE — Progress Notes (Signed)
[facility] [phone]   [phone]  OFFICE PROGRESS NOTE  Copland, Gwenlyn Found, MD [address] [place] [address]   DIAGNOSIS: Limited stage (T1c, N2, M0) Small Cell Lung Cancer. She presented with a right upper lobe suprahilar nodule/adenopathy, right paratracheal lymph node, and a hypermetabolic right upper lobe nodule. She was diagnosed in February 2022.    PRIOR THERAPY:   1) Systemic chemotherapy with cisplatin 80 mg/m2 on day 1, etoposide 100 mg/m2 on days 1, 2, and 3 IV every 3 weeks with concurrent radiation.  Last dose 02/01/21. Status post 4 cycles.  2) prophylactic cranial irradiation under the care of Dr. Mitzi Hansen.  Last treatment on 03/28/2021   CURRENT THERAPY: Observation   INTERVAL HISTORY: Susan Cortez 62 y.o. female returns to the clinic today for follow-up visit.  The patient is feeling fine today with no concerning complaints.  She denied having any current chest pain, shortness of breath, cough or hemoptysis.  She has no nausea, vomiting, diarrhea or constipation.  She has no headache or visual changes.  She has no recent weight loss or night sweats.  She is currently on observation.  She had repeat CT scan of the chest performed last week and she is here for evaluation and discussion of her scan results.  MEDICAL HISTORY: Past Medical History:  Diagnosis Date   Allergy    Arthritis    Basal cell carcinoma 2018   Removed from Right neck per patient    Diverticulosis 2013   Dyslipidemia 03/01/2018   HLD (hyperlipidemia)    Hx of adenomatous colonic polyps 2013   OAB (overactive bladder)    Pre-diabetes    Small cell lung cancer (HCC) 11/09/2020    ALLERGIES:  is allergic to lyrica [pregabalin], butrans [buprenorphine], chantix [varenicline], diclofenac, doxycycline, meloxicam, tolmetin, and nsaids.  MEDICATIONS:  Current Outpatient Medications  Medication Sig Dispense Refill   acetaminophen-codeine  (TYLENOL #4) 300-60 MG tablet Take 1 tablet by mouth daily as needed for moderate pain. 30 tablet 2   CALCIUM PO Take 650 mg by mouth daily.     Cholecalciferol (VITAMIN D3) 125 MCG (5000 UT) CAPS Take 5,000 Units by mouth daily.     Cyanocobalamin (B-12) 100 MCG TABS      esomeprazole (NEXIUM) 40 MG capsule Take 1 capsule (40 mg total) by mouth daily at 12 noon. 30 capsule 11   fluticasone (FLONASE) 50 MCG/ACT nasal spray SPRAY 2 SPRAYS INTO EACH NOSTRIL EVERY DAY 48 mL 2   LORazepam (ATIVAN) 1 MG tablet TAKE 0.5 TABLET BY MOUTH EVERY 8 HOURS. TAKE 1 TABLET 30 MINUTES PRIOR TO MRI OR RADIATION 14 tablet 0   lovastatin (MEVACOR) 20 MG tablet Take 1 tablet (20 mg total) by mouth at bedtime. 90 tablet 1   metoprolol succinate (TOPROL-XL) 25 MG 24 hr tablet TAKE 1/2 TABLET BY MOUTH EVERY DAY 45 tablet 3   Multiple Vitamins-Minerals (MULTIVITAMIN ADULT, MINERALS,) TABS      No current facility-administered medications for this visit.    SURGICAL HISTORY:  Past Surgical History:  Procedure Laterality Date   CESAREAN SECTION     X 3   COLONOSCOPY  2013, 2016, 2019   hx colon polyps   DILATION AND CURETTAGE OF UTERUS     FUDUCIAL PLACEMENT Right 10/18/2020   Procedure: PLACEMENT OF FUDUCIAL;  Surgeon: Leslye Peer, MD;  Location: [facility] OR;  Service: Thoracic;  Laterality: Right;  IR IMAGING GUIDED PORT INSERTION  11/29/2020   IR REMOVAL TUN ACCESS W/ PORT W/O FL MOD SED  05/08/2022   SHOULDER ARTHROSCOPY WITH OPEN ROTATOR CUFF REPAIR Right 05/2009   TONSILLECTOMY  1977   TUBAL LIGATION     VIDEO BRONCHOSCOPY WITH ENDOBRONCHIAL NAVIGATION N/A 10/18/2020   Procedure: VIDEO BRONCHOSCOPY WITH ENDOBRONCHIAL NAVIGATION;  Surgeon: Leslye Peer, MD;  Location: [facility] OR;  Service: Thoracic;  Laterality: N/A;   VIDEO BRONCHOSCOPY WITH ENDOBRONCHIAL ULTRASOUND N/A 10/18/2020   Procedure: VIDEO BRONCHOSCOPY WITH ENDOBRONCHIAL ULTRASOUND;  Surgeon: Leslye Peer, MD;  Location: [facility] OR;  Service: Thoracic;   Laterality: N/A;   WISDOM TOOTH EXTRACTION      REVIEW OF SYSTEMS:  A comprehensive review of systems was negative.   PHYSICAL EXAMINATION: General appearance: alert, cooperative, and no distress Head: Normocephalic, without obvious abnormality, atraumatic Neck: no adenopathy, no JVD, supple, symmetrical, trachea midline, and thyroid not enlarged, symmetric, no tenderness/mass/nodules Lymph nodes: Cervical, supraclavicular, and axillary nodes normal. Resp: clear to auscultation bilaterally Back: symmetric, no curvature. ROM normal. No CVA tenderness. Cardio: regular rate and rhythm, S1, S2 normal, no murmur, click, rub or gallop GI: soft, non-tender; bowel sounds normal; no masses,  no organomegaly Extremities: extremities normal, atraumatic, no cyanosis or edema  ECOG PERFORMANCE STATUS: 1 - Symptomatic but completely ambulatory  Blood pressure 102/70, pulse (!) 104, temperature 98.1 F (36.7 C), temperature source Oral, resp. rate 17, height 5' 8.5" (1.74 m), weight 191 lb 1.6 oz (86.7 kg), SpO2 98%.  LABORATORY DATA: Lab Results  Component Value Date   WBC 7.4 03/19/2023   HGB 14.5 03/19/2023   HCT 43.3 03/19/2023   MCV 94.5 03/19/2023   PLT 330 03/19/2023      Chemistry      Component Value Date/Time   NA 139 03/19/2023 0944   K 4.2 03/19/2023 0944   CL 105 03/19/2023 0944   CO2 28 03/19/2023 0944   BUN 9 03/19/2023 0944   CREATININE 0.80 03/19/2023 0944   CREATININE 0.65 06/08/2020 1050      Component Value Date/Time   CALCIUM 9.8 03/19/2023 0944   ALKPHOS 50 03/19/2023 0944   AST 23 03/19/2023 0944   ALT 22 03/19/2023 0944   BILITOT 0.4 03/19/2023 0944       RADIOGRAPHIC STUDIES: MM 3D SCREENING MAMMOGRAM BILATERAL BREAST  Result Date: 03/22/2023 CLINICAL DATA:  Screening. EXAM: DIGITAL SCREENING BILATERAL MAMMOGRAM WITH TOMOSYNTHESIS AND CAD TECHNIQUE: Bilateral screening digital craniocaudal and mediolateral oblique mammograms were obtained. Bilateral  screening digital breast tomosynthesis was performed. The images were evaluated with computer-aided detection. COMPARISON:  Previous exam(s). ACR Breast Density Category a: The breasts are almost entirely fatty. FINDINGS: There are no findings suspicious for malignancy. IMPRESSION: No mammographic evidence of malignancy. A result letter of this screening mammogram will be mailed directly to the patient. RECOMMENDATION: Screening mammogram in one year. (Code:SM-B-01Y) BI-RADS CATEGORY  1: Negative. Electronically Signed   By: Elberta Fortis M.D.   On: 03/22/2023 13:37    ASSESSMENT AND PLAN: This is a very pleasant 62 years old white female with limited stage (T1c, N2, M0) small cell lung cancer presented with right upper lobe and suprahilar mass in addition to mediastinal lymphadenopathy diagnosed in February 2022.  The patient underwent systemic chemotherapy with cisplatin 80 mg/M2 on day 1 and etoposide 100 mg/M2 on days 1, 2 and 3 every 3 weeks status post 4 cycles. This was concurrent with radiotherapy followed by prophylactic cranial irradiation. The patient  is currently on observation and she is feeling fine with no concerning complaints. She had repeat CT scan of the chest performed on March 17, 2023 but unfortunately the final report is still pending.  I personally and independently reviewed the scan images and discussed the result with the patient today. I do not see any concerning findings for disease recurrence or metastasis except for increased soft tissue density in the right hilar region that may be related to the previous radiotherapy but I will wait for the final report for confirmation. I recommended for the patient to continue on observation with repeat CT scan of the chest in 6 months. The patient was advised to call immediately if she has any other concerning symptoms in the interval. The patient voices understanding of current disease status and treatment options and is in agreement with  the current care plan.  All questions were answered. The patient knows to call the clinic with any problems, questions or concerns. We can certainly see the patient much sooner if necessary.  Disclaimer: This note was dictated with voice recognition software. Similar sounding words can inadvertently be transcribed and may not be corrected upon review.

## 2023-03-27 ENCOUNTER — Encounter (INDEPENDENT_AMBULATORY_CARE_PROVIDER_SITE_OTHER): Payer: Self-pay

## 2023-03-29 ENCOUNTER — Encounter: Payer: Self-pay | Admitting: Internal Medicine

## 2023-04-02 ENCOUNTER — Encounter: Payer: Self-pay | Admitting: Internal Medicine

## 2023-04-02 ENCOUNTER — Other Ambulatory Visit: Payer: Self-pay | Admitting: Internal Medicine

## 2023-04-05 ENCOUNTER — Encounter (INDEPENDENT_AMBULATORY_CARE_PROVIDER_SITE_OTHER): Payer: Self-pay

## 2023-04-05 ENCOUNTER — Telehealth: Payer: Self-pay | Admitting: Internal Medicine

## 2023-04-06 ENCOUNTER — Telehealth: Payer: Self-pay | Admitting: Internal Medicine

## 2023-04-06 NOTE — Telephone Encounter (Signed)
Scheduled per scheduling message, patient has been called and notified of upcoming appointments.

## 2023-04-08 DIAGNOSIS — H2513 Age-related nuclear cataract, bilateral: Secondary | ICD-10-CM | POA: Diagnosis not present

## 2023-04-08 DIAGNOSIS — H04123 Dry eye syndrome of bilateral lacrimal glands: Secondary | ICD-10-CM | POA: Diagnosis not present

## 2023-04-11 ENCOUNTER — Inpatient Hospital Stay: Payer: 59 | Attending: Internal Medicine | Admitting: Internal Medicine

## 2023-04-11 DIAGNOSIS — C3491 Malignant neoplasm of unspecified part of right bronchus or lung: Secondary | ICD-10-CM

## 2023-04-11 MED ORDER — AZITHROMYCIN 250 MG PO TABS: ORAL_TABLET | ORAL | 0 refills | Status: DC

## 2023-04-11 NOTE — Progress Notes (Signed)
[facility] [phone]   [phone]  PROGRESS NOTE FOR TELEMEDICINE VISITS  Cortez, Susan Found, MD [address] [place] [address]  I connected withNAME@ on 04/11/23 at 11:15 AM EDT by telephone visit and verified that I am speaking with the correct person using two identifiers.   I discussed the limitations, risks, security and privacy concerns of performing an evaluation and management service by telemedicine and the availability of in-person appointments. I also discussed with the patient that there may be a patient responsible charge related to this service. The patient expressed understanding and agreed to proceed.  Other persons participating in the visit and their role in the encounter:  None  Patient's location:  Home Provider's location: [facility]  DIAGNOSIS: Limited stage (T1c, N2, M0) Small Cell Lung Cancer. She presented with a right upper lobe suprahilar nodule/adenopathy, right paratracheal lymph node, and a hypermetabolic right upper lobe nodule. She was diagnosed in February 2022.    PRIOR THERAPY:   1) Systemic chemotherapy with cisplatin 80 mg/m2 on day 1, etoposide 100 mg/m2 on days 1, 2, and 3 IV every 3 weeks with concurrent radiation.  Last dose 02/01/21. Status post 4 cycles.  2) prophylactic cranial irradiation under the care of Dr. Mitzi Hansen.  Last treatment on 03/28/2021   CURRENT THERAPY: Observation   INTERVAL HISTORY: Susan Cortez 62 y.o. female has a telephone virtual visit with me today for evaluation and discussion of her scan results.  The patient is feeling fine today with no concerning complaints except for mild fatigue.  She denied having any current chest pain, shortness of breath, cough or hemoptysis.  She has no nausea, vomiting, diarrhea or constipation.  She has no headache or visual changes.  She had CT scan of the chest performed recently.  She was seen a week ago but the scan  results were not available.  We called the patient with her scan results but she has more question and she asked for a virtual visit to discuss her concerns.  MEDICAL HISTORY: Past Medical History:  Diagnosis Date   Allergy    Arthritis    Basal cell carcinoma 2018   Removed from Right neck per patient    Diverticulosis 2013   Dyslipidemia 03/01/2018   HLD (hyperlipidemia)    Hx of adenomatous colonic polyps 2013   OAB (overactive bladder)    Pre-diabetes    Small cell lung cancer (HCC) 11/09/2020    ALLERGIES:  is allergic to lyrica [pregabalin], butrans [buprenorphine], chantix [varenicline], diclofenac, doxycycline, meloxicam, tolmetin, and nsaids.  MEDICATIONS:  Current Outpatient Medications  Medication Sig Dispense Refill   acetaminophen-codeine (TYLENOL #4) 300-60 MG tablet Take 1 tablet by mouth daily as needed for moderate pain. 30 tablet 2   CALCIUM PO Take 650 mg by mouth daily.     Cholecalciferol (VITAMIN D3) 125 MCG (5000 UT) CAPS Take 5,000 Units by mouth daily.     Cyanocobalamin (B-12) 100 MCG TABS      esomeprazole (NEXIUM) 40 MG capsule Take 1 capsule (40 mg total) by mouth daily at 12 noon. 30 capsule 11   fluticasone (FLONASE) 50 MCG/ACT nasal spray SPRAY 2 SPRAYS INTO EACH NOSTRIL EVERY DAY (Patient not taking: Reported on 03/25/2023) 48 mL 2   LORazepam (ATIVAN) 1 MG tablet TAKE 0.5 TABLET BY MOUTH EVERY 8 HOURS. TAKE 1 TABLET 30 MINUTES PRIOR TO MRI OR RADIATION 14 tablet 0   lovastatin (MEVACOR) 20  MG tablet Take 1 tablet (20 mg total) by mouth at bedtime. 90 tablet 1   metoprolol succinate (TOPROL-XL) 25 MG 24 hr tablet TAKE 1/2 TABLET BY MOUTH EVERY DAY 45 tablet 3   Multiple Vitamins-Minerals (MULTIVITAMIN ADULT, MINERALS,) TABS      No current facility-administered medications for this visit.    SURGICAL HISTORY:  Past Surgical History:  Procedure Laterality Date   CESAREAN SECTION     X 3   COLONOSCOPY  2013, 2016, 2019   hx colon polyps    DILATION AND CURETTAGE OF UTERUS     FUDUCIAL PLACEMENT Right 10/18/2020   Procedure: PLACEMENT OF FUDUCIAL;  Surgeon: Leslye Peer, MD;  Location: [facility];  Service: Thoracic;  Laterality: Right;   IR IMAGING GUIDED PORT INSERTION  11/29/2020   IR REMOVAL TUN ACCESS W/ PORT W/O FL MOD SED  05/08/2022   SHOULDER ARTHROSCOPY WITH OPEN ROTATOR CUFF REPAIR Right 05/2009   TONSILLECTOMY  1977   TUBAL LIGATION     VIDEO BRONCHOSCOPY WITH ENDOBRONCHIAL NAVIGATION N/A 10/18/2020   Procedure: VIDEO BRONCHOSCOPY WITH ENDOBRONCHIAL NAVIGATION;  Surgeon: Leslye Peer, MD;  Location: [facility];  Service: Thoracic;  Laterality: N/A;   VIDEO BRONCHOSCOPY WITH ENDOBRONCHIAL ULTRASOUND N/A 10/18/2020   Procedure: VIDEO BRONCHOSCOPY WITH ENDOBRONCHIAL ULTRASOUND;  Surgeon: Leslye Peer, MD;  Location: [facility];  Service: Thoracic;  Laterality: N/A;   WISDOM TOOTH EXTRACTION      REVIEW OF SYSTEMS:  A comprehensive review of systems was negative.    LABORATORY DATA: Lab Results  Component Value Date   WBC 7.4 03/19/2023   HGB 14.5 03/19/2023   HCT 43.3 03/19/2023   MCV 94.5 03/19/2023   PLT 330 03/19/2023      Chemistry      Component Value Date/Time   NA 139 03/19/2023 0944   K 4.2 03/19/2023 0944   CL 105 03/19/2023 0944   CO2 28 03/19/2023 0944   BUN 9 03/19/2023 0944   CREATININE 0.80 03/19/2023 0944   CREATININE 0.65 06/08/2020 1050      Component Value Date/Time   CALCIUM 9.8 03/19/2023 0944   ALKPHOS 50 03/19/2023 0944   AST 23 03/19/2023 0944   ALT 22 03/19/2023 0944   BILITOT 0.4 03/19/2023 0944       RADIOGRAPHIC STUDIES: CT Chest W Contrast  Result Date: 03/27/2023 CLINICAL DATA:  Small cell lung cancer.  * Tracking Code: BO * EXAM: CT CHEST WITH CONTRAST TECHNIQUE: Multidetector CT imaging of the chest was performed during intravenous contrast administration. RADIATION DOSE REDUCTION: This exam was performed according to the departmental dose-optimization program which  includes automated exposure control, adjustment of the mA and/or kV according to patient size and/or use of iterative reconstruction technique. CONTRAST:  75mL OMNIPAQUE IOHEXOL 300 MG/ML  SOLN COMPARISON:  09/17/2022 and PET 11/08/2020. FINDINGS: Cardiovascular: Atherosclerotic calcification of the aorta. Heart size normal although the left ventricle appears somewhat dilated. No pericardial effusion. Mediastinum/Nodes: 1.4 cm low-attenuation right thyroid nodule. No follow-up recommended. (Ref: J Am Coll Radiol. 2015 Feb;12(2): 143-50).No pathologically enlarged mediastinal, left hilar or axillary lymph nodes. Slight soft tissue thickening in the right hilum, unchanged. Mild diffuse esophageal wall thickening, similar. Lungs/Pleura: Centrilobular and paraseptal emphysema. Fiducial markers in the posterior segment right upper lobe. Interstitial coarsening, bronchiectasis, ground-glass and minimal consolidation in the perihilar right lung, increased from 09/17/2022. Focal peribronchovascular nodularity in the anterolateral right lower lobe (8/105), largely new from 09/17/2022. No pleural fluid. Airway is unremarkable. Upper Abdomen:  Visualized portions of the liver gallbladder and right adrenal gland are unremarkable. Left adrenal nodules measure up to 1.5 cm and were characterized as adenomas on PET 11/08/2020. No specific follow-up necessary. Visualized portions of the kidneys, spleen, pancreas, stomach and bowel are grossly unremarkable. No upper abdominal adenopathy. Musculoskeletal: Degenerative changes in the spine. No worrisome lytic or sclerotic lesions. IMPRESSION: 1. Evolving changes of radiation therapy in the right lung, including a new focal area of right perihilar consolidation. Recommend attention on follow-up. 2. Largely new peribronchovascular nodularity in the anterolateral right lower lobe. Recommend attention on follow-up. 3. Mild diffuse esophageal wall thickening. Please correlate clinically. 4.  Left adrenal adenomas. 5.  Aortic atherosclerosis (ICD10-I70.0). 6.  Emphysema (ICD10-J43.9). Electronically Signed   By: Leanna Battles M.D.   On: 03/27/2023 09:54   MM 3D SCREENING MAMMOGRAM BILATERAL BREAST  Result Date: 03/22/2023 CLINICAL DATA:  Screening. EXAM: DIGITAL SCREENING BILATERAL MAMMOGRAM WITH TOMOSYNTHESIS AND CAD TECHNIQUE: Bilateral screening digital craniocaudal and mediolateral oblique mammograms were obtained. Bilateral screening digital breast tomosynthesis was performed. The images were evaluated with computer-aided detection. COMPARISON:  Previous exam(s). ACR Breast Density Category a: The breasts are almost entirely fatty. FINDINGS: There are no findings suspicious for malignancy. IMPRESSION: No mammographic evidence of malignancy. A result letter of this screening mammogram will be mailed directly to the patient. RECOMMENDATION: Screening mammogram in one year. (Code:SM-B-01Y) BI-RADS CATEGORY  1: Negative. Electronically Signed   By: Elberta Fortis M.D.   On: 03/22/2023 13:37    ASSESSMENT AND PLAN: This is a very pleasant 62 years old white female with limited stage (T1c, N2, M0) small cell lung cancer presented with right upper lobe and suprahilar mass in addition to mediastinal lymphadenopathy diagnosed in February 2022.  The patient underwent systemic chemotherapy with cisplatin 80 mg/M2 on day 1 and etoposide 100 mg/M2 on days 1, 2 and 3 every 3 weeks status post 4 cycles. This was concurrent with radiotherapy followed by prophylactic cranial irradiation. The patient is currently on observation and she is feeling fine. She had repeat CT scan of the chest performed recently I personally and independently reviewed the scan images and discussed the result with the patient today. Her scan showed evolving changes of radiation therapy in the right lung including a new focal area of right perihilar consolidation that need close monitoring.  She also has largely new  peribronchovascular nodularity in the anterior lateral right lower lobe.  This could be result from her previous treatment/inflammatory process. I recommended for the patient to continue on observation with repeat CT scan of the chest in 3 months for further evaluation of these areas. For the persistent recent cough, I will start her Z-Pak. The patient was advised to call immediately if she has any other concerning symptoms in the interval. I discussed the assessment and treatment plan with the patient. The patient was provided an opportunity to ask questions and all were answered. The patient agreed with the plan and demonstrated an understanding of the instructions.   The patient was advised to call back or seek an in-person evaluation if the symptoms worsen or if the condition fails to improve as anticipated.  I provided 13 minutes of non face-to-face telephone visit time during this encounter, and > 50% was spent counseling as documented under my assessment & plan.  Lajuana Matte, MD 04/11/2023 11:05 AM  Disclaimer: This note was dictated with voice recognition software. Similar sounding words can inadvertently be transcribed and may not be corrected upon  review.

## 2023-04-24 ENCOUNTER — Encounter: Payer: Self-pay | Admitting: Registered Nurse

## 2023-04-24 ENCOUNTER — Encounter: Payer: 59 | Attending: Registered Nurse | Admitting: Registered Nurse

## 2023-04-24 VITALS — BP 105/70 | HR 102 | Ht 68.5 in | Wt 190.0 lb

## 2023-04-24 DIAGNOSIS — Z5181 Encounter for therapeutic drug level monitoring: Secondary | ICD-10-CM | POA: Diagnosis not present

## 2023-04-24 DIAGNOSIS — M542 Cervicalgia: Secondary | ICD-10-CM | POA: Insufficient documentation

## 2023-04-24 DIAGNOSIS — G8929 Other chronic pain: Secondary | ICD-10-CM | POA: Diagnosis not present

## 2023-04-24 DIAGNOSIS — M4802 Spinal stenosis, cervical region: Secondary | ICD-10-CM | POA: Insufficient documentation

## 2023-04-24 DIAGNOSIS — M25511 Pain in right shoulder: Secondary | ICD-10-CM | POA: Insufficient documentation

## 2023-04-24 DIAGNOSIS — M546 Pain in thoracic spine: Secondary | ICD-10-CM | POA: Insufficient documentation

## 2023-04-24 DIAGNOSIS — G894 Chronic pain syndrome: Secondary | ICD-10-CM | POA: Insufficient documentation

## 2023-04-24 DIAGNOSIS — Z79891 Long term (current) use of opiate analgesic: Secondary | ICD-10-CM | POA: Diagnosis not present

## 2023-04-24 MED ORDER — ACETAMINOPHEN-CODEINE 300-60 MG PO TABS: 1.0000 | ORAL_TABLET | Freq: Every day | ORAL | 2 refills | Status: DC | PRN

## 2023-04-24 NOTE — Progress Notes (Signed)
Subjective:    Patient ID: Susan Cortez, female    DOB: 1960/10/29, 62 y.o.   MRN: [id number]  HPI: Susan Cortez is a 62 y.o. female who returns for follow up appointment for chronic pain and medication refill. She states her pain is located in her neck, right shoulder and upper- back. She. rates her pain 4. Her current exercise regime is walking and performing stretching exercises.  Ms. Reimold Morphine equivalent is 9.00 MME.   Last UDS was Performed on 10/26/2022, it was consistent.     Pain Inventory Average Pain 7 Pain Right Now 4 My pain is sharp, burning, stabbing, and aching  In the last 24 hours, has pain interfered with the following? General activity 1 Relation with others 0 Enjoyment of life 1 What TIME of day is your pain at its worst? morning  and daytime Sleep (in general) Fair  Pain is worse with: walking, bending, standing, and some activites Pain improves with: rest, pacing activities, and medication Relief from Meds: 7  Family History  Problem Relation Age of Onset   Diabetes Father    Heart disease Father    Hyperlipidemia Father    Colon cancer Neg Hx    Colon polyps Neg Hx    Esophageal cancer Neg Hx    Rectal cancer Neg Hx    Stomach cancer Neg Hx    Social History   Socioeconomic History   Marital status: Divorced    Spouse name: Not on file   Number of children: Not on file   Years of education: Not on file   Highest education level: Not on file  Occupational History   Not on file  Tobacco Use   Smoking status: Every Day    Current packs/day: 1.00    Average packs/day: 1 pack/day for 45.0 years (45.0 ttl pk-yrs)    Types: Cigarettes   Smokeless tobacco: Never   Tobacco comments:    1 pack smoked per day [facility] 09/19/22  Vaping Use   Vaping status: Former  Substance and Sexual Activity   Alcohol use: No   Drug use: No   Sexual activity: Not on file    Comment: Tubal Ligation  Other Topics Concern   Not on file  Social History  Narrative   Not on file   Social Determinants of Health   Financial Resource Strain: Low Risk  (04/18/2021)   Overall Financial Resource Strain (CARDIA)    Difficulty of Paying Living Expenses: Not hard at all  Food Insecurity: Low Risk  (03/20/2023)   Received from [facility]   Food vital sign    Within the past 12 months, you worried that your food would run out before you got money to buy more: Never true    Within the past 12 months, the food you bought just didn't last and you didn't have money to get more. : Never true  Transportation Needs: Not on file (03/20/2023)  Physical Activity: Inactive (04/18/2021)   Exercise Vital Sign    Days of Exercise per Week: 0 days    Minutes of Exercise per Session: 0 min  Stress: Stress Concern Present (04/18/2021)   [employer] of Occupational Health - Occupational Stress Questionnaire    Feeling of Stress : To some extent  Social Connections: Socially Isolated (04/18/2021)   Social Connection and Isolation Panel [NHANES]    Frequency of Communication with Friends and Family: More than three times a week    Frequency of Social  Gatherings with Friends and Family: More than three times a week    Attends Religious Services: Never    Active Member of Clubs or Organizations: No    Attends Banker Meetings: Never    Marital Status: Divorced   Past Surgical History:  Procedure Laterality Date   CESAREAN SECTION     X 3   COLONOSCOPY  2013, 2016, 2019   hx colon polyps   DILATION AND CURETTAGE OF UTERUS     FUDUCIAL PLACEMENT Right 10/18/2020   Procedure: PLACEMENT OF FUDUCIAL;  Surgeon: Leslye Peer, MD;  Location: [facility];  Service: Thoracic;  Laterality: Right;   IR IMAGING GUIDED PORT INSERTION  11/29/2020   IR REMOVAL TUN ACCESS W/ PORT W/O FL MOD SED  05/08/2022   SHOULDER ARTHROSCOPY WITH OPEN ROTATOR CUFF REPAIR Right 05/2009   TONSILLECTOMY  1977   TUBAL LIGATION     VIDEO BRONCHOSCOPY WITH ENDOBRONCHIAL NAVIGATION  N/A 10/18/2020   Procedure: VIDEO BRONCHOSCOPY WITH ENDOBRONCHIAL NAVIGATION;  Surgeon: Leslye Peer, MD;  Location: [facility];  Service: Thoracic;  Laterality: N/A;   VIDEO BRONCHOSCOPY WITH ENDOBRONCHIAL ULTRASOUND N/A 10/18/2020   Procedure: VIDEO BRONCHOSCOPY WITH ENDOBRONCHIAL ULTRASOUND;  Surgeon: Leslye Peer, MD;  Location: [facility];  Service: Thoracic;  Laterality: N/A;   WISDOM TOOTH EXTRACTION     Past Surgical History:  Procedure Laterality Date   CESAREAN SECTION     X 3   COLONOSCOPY  2013, 2016, 2019   hx colon polyps   DILATION AND CURETTAGE OF UTERUS     FUDUCIAL PLACEMENT Right 10/18/2020   Procedure: PLACEMENT OF FUDUCIAL;  Surgeon: Leslye Peer, MD;  [facility]: [facility];  Service: Thoracic;  Laterality: Right;   IR IMAGING GUIDED PORT INSERTION  11/29/2020   IR REMOVAL TUN ACCESS W/ PORT W/O FL MOD SED  05/08/2022   SHOULDER ARTHROSCOPY WITH OPEN ROTATOR CUFF REPAIR Right 05/2009   TONSILLECTOMY  1977   TUBAL LIGATION     VIDEO BRONCHOSCOPY WITH ENDOBRONCHIAL NAVIGATION N/A 10/18/2020   Procedure: VIDEO BRONCHOSCOPY WITH ENDOBRONCHIAL NAVIGATION;  Surgeon: Leslye Peer, MD;  [facility]: [facility];  Service: Thoracic;  Laterality: N/A;   VIDEO BRONCHOSCOPY WITH ENDOBRONCHIAL ULTRASOUND N/A 10/18/2020   Procedure: VIDEO BRONCHOSCOPY WITH ENDOBRONCHIAL ULTRASOUND;  Surgeon: Leslye Peer, MD;  Location: [facility];  Service: Thoracic;  Laterality: N/A;   WISDOM TOOTH EXTRACTION     Past Medical History:  Diagnosis Date   Allergy    Arthritis    Basal cell carcinoma 2018   Removed from Right neck per patient    Diverticulosis 2013   Dyslipidemia 03/01/2018   HLD (hyperlipidemia)    Hx of adenomatous colonic polyps 2013   OAB (overactive bladder)    Pre-diabetes    Small cell lung cancer (HCC) 11/09/2020   BP 105/70   Pulse (!) 102   Ht 5' 8.5" (1.74 m)   Wt 190 lb (86.2 kg)   LMP  (LMP Unknown)   SpO2 96%   BMI 28.47 kg/m   Opioid Risk Score:   Fall Risk Score:   `1  Depression screen [facility] 2/9     01/23/2023   10:46 AM 10/26/2022   10:35 AM 09/21/2022    3:19 PM 09/07/2022    9:26 AM 06/18/2022    8:28 AM 04/19/2022    1:06 PM 01/24/2022   10:47 AM  Depression screen PHQ 2/9  Decreased Interest 0 0 0 0 0 0 0  Down, Depressed, Hopeless 0 0 0 0 0 0 0  PHQ - 2 Score 0 0 0 0 0 0 0     Review of Systems  Musculoskeletal:  Positive for neck pain.  All other systems reviewed and are negative.     Objective:   Physical Exam Vitals and nursing note reviewed.  Constitutional:      Appearance: Normal appearance.  Neck:     Comments: Cervical Paraspinal Tenderness: C-5-C-6 Cardiovascular:     Rate and Rhythm: Normal rate and regular rhythm.     Pulses: Normal pulses.     Heart sounds: Normal heart sounds.  Pulmonary:     Effort: Pulmonary effort is normal.     Breath sounds: Normal breath sounds.  Musculoskeletal:     Cervical back: Normal range of motion and neck supple.     Comments: Normal Muscle Bulk and Muscle Testing Reveals:  Upper Extremities: Right: Decreased ROM 90 Degrees  and Muscle Strength 5/5 Right AC Joint Tenderness Left Upper Extremity: Full ROM and Muscle Strength 5/5 Lower Extremities: Full ROM and Muscle Strength 5/5 Arises from chair with ease Narrow Based Gait     Skin:    General: Skin is warm and dry.  Neurological:     Mental Status: She is alert and oriented to person, place, and time.  Psychiatric:        Mood and Affect: Mood normal.        Behavior: Behavior normal.         Assessment & Plan:  1.Cervical spinal stenosis without evidence of radiculopathy/ Cervical Radiculitis . Chronic neck pain: Continue to Monitor.  Refilled Tylenol #4  300/60mg  daily, #30. 04/24/2023. We will continue the opioid monitoring program, this consists of regular clinic visits, examinations, urine drug screen, pill counts as well as use of [place] Controlled Substance Reporting system. A 12 month History has been  reviewed on the [place] Controlled Substance Reporting System on 04/24/2023. 2. Right shoulder rotator cuff tear, status post repair with chronic pain and contracture : Continue with exercise regime as tolerated .04/24/2023. 3. Insomnia: Continue Melatonin. 04/24/2023 4. Chronic Bilateral Lower Back Pain without Sciatica: No complaints today. Continue HEP as tolerated. Continue to Monitor. 04/24/2023  5. Right Greater Trochanter Bursitis: No complaints Today. Continue to Alternate Ice and Heat Therapy. Continue to Monitor 04/24/2023 6. Chronic Thoracic Back Pain: Continue current medication regime. Continue HEP as tolerated. Continue to monitor. 04/24/2023 7. Tachycardia: Compliant with medication. PCP following. Continue to Monitor.    F/U in 3 months

## 2023-04-25 ENCOUNTER — Ambulatory Visit (INDEPENDENT_AMBULATORY_CARE_PROVIDER_SITE_OTHER): Payer: 59 | Admitting: *Deleted

## 2023-04-25 VITALS — Ht 68.0 in | Wt 190.0 lb

## 2023-04-25 DIAGNOSIS — Z0000 Encounter for general adult medical examination without abnormal findings: Secondary | ICD-10-CM

## 2023-04-25 NOTE — Progress Notes (Signed)
Subjective:   Susan Cortez is a 62 y.o. female who presents for Medicare Annual (Subsequent) preventive examination.  Visit Complete: Virtual  I connected with  Susan Cortez on 04/25/23 by a audio enabled telemedicine application and verified that I am speaking with the correct person using two identifiers.  Patient Location: Home  Provider Location: Office/Clinic  I discussed the limitations of evaluation and management by telemedicine. The patient expressed understanding and agreed to proceed.  Patient Medicare AWV questionnaire was completed by the patient on 04/18/23; I have confirmed that all information answered by patient is correct and no changes since this date.  Review of Systems     Cardiac Risk Factors include: advanced age (>49men, >77 women);dyslipidemia;smoking/ tobacco exposure     Objective:   Vital Signs: Vital signs are patient reported.  Today's Vitals   04/18/23 0946  Weight: 190 lb (86.2 kg)  Height: 5\' 8"  (1.727 m)  PainSc: 7    Body mass index is 28.89 kg/m.     04/25/2023    1:02 PM 01/25/2023   11:30 AM 09/20/2022    8:30 AM 07/30/2022   11:18 AM 04/19/2022    1:03 PM 01/29/2022   10:06 AM 10/25/2021   10:27 AM  Advanced Directives  Does Patient Have a Medical Advance Directive? Yes Yes Yes Yes Yes Yes Yes  Type of Estate agent of [place];Living will Living will;Healthcare Power of [employer] Power of [place];Living will Healthcare Power of [place];Living will Healthcare Power of Attorney  Does patient want to make changes to medical advance directive? No - Patient declined    No - Patient declined    Copy of Healthcare Power of Attorney in Chart? Yes - validated most recent copy scanned in chart (See row information)    Yes - validated most recent copy scanned in chart (See row information)      Current Medications (verified) Outpatient Encounter Medications as of 04/25/2023  Medication Sig    acetaminophen-codeine (TYLENOL #4) 300-60 MG tablet Take 1 tablet by mouth daily as needed for moderate pain.   azithromycin (ZITHROMAX) 250 MG tablet Use as instructed   CALCIUM PO Take 650 mg by mouth daily.   Cholecalciferol (VITAMIN D3) 125 MCG (5000 UT) CAPS Take 5,000 Units by mouth daily.   Cyanocobalamin (B-12) 100 MCG TABS    LORazepam (ATIVAN) 1 MG tablet TAKE 0.5 TABLET BY MOUTH EVERY 8 HOURS. TAKE 1 TABLET 30 MINUTES PRIOR TO MRI OR RADIATION   lovastatin (MEVACOR) 20 MG tablet Take 1 tablet (20 mg total) by mouth at bedtime.   metoprolol succinate (TOPROL-XL) 25 MG 24 hr tablet TAKE 1/2 TABLET BY MOUTH EVERY DAY   Multiple Vitamins-Minerals (MULTIVITAMIN ADULT, MINERALS,) TABS    [DISCONTINUED] esomeprazole (NEXIUM) 40 MG capsule Take 1 capsule (40 mg total) by mouth daily at 12 noon.   [DISCONTINUED] fluticasone (FLONASE) 50 MCG/ACT nasal spray SPRAY 2 SPRAYS INTO EACH NOSTRIL EVERY DAY (Patient not taking: Reported on 03/25/2023)   No facility-administered encounter medications on file as of 04/25/2023.    Allergies (verified) Lyrica [pregabalin], Butrans [buprenorphine], Chantix [varenicline], Diclofenac, Doxycycline, Meloxicam, Tolmetin, and Nsaids   History: Past Medical History:  Diagnosis Date   Allergy    Arthritis    Basal cell carcinoma 2018   Removed from Right neck per patient    COPD (chronic obstructive pulmonary disease) (HCC) 06/2019   Diverticulosis 2013   Dyslipidemia 03/01/2018   GERD (gastroesophageal reflux disease) 04/2021  HLD (hyperlipidemia)    Hx of adenomatous colonic polyps 2013   OAB (overactive bladder)    Pre-diabetes    Small cell lung cancer (HCC) 11/09/2020   Stroke (HCC) 2022   Past Surgical History:  Procedure Laterality Date   CESAREAN SECTION     X 3   COLONOSCOPY  2013, 2016, 2019   hx colon polyps   DILATION AND CURETTAGE OF UTERUS     FUDUCIAL PLACEMENT Right 10/18/2020   Procedure: PLACEMENT OF FUDUCIAL;  Surgeon: Leslye Peer, MD;  Location: [facility] OR;  Service: Thoracic;  Laterality: Right;   IR IMAGING GUIDED PORT INSERTION  11/29/2020   IR REMOVAL TUN ACCESS W/ PORT W/O FL MOD SED  05/08/2022   SHOULDER ARTHROSCOPY WITH OPEN ROTATOR CUFF REPAIR Right 05/2009   TONSILLECTOMY  1977   TUBAL LIGATION     VIDEO BRONCHOSCOPY WITH ENDOBRONCHIAL NAVIGATION N/A 10/18/2020   Procedure: VIDEO BRONCHOSCOPY WITH ENDOBRONCHIAL NAVIGATION;  Surgeon: Leslye Peer, MD;  Location: [facility] OR;  Service: Thoracic;  Laterality: N/A;   VIDEO BRONCHOSCOPY WITH ENDOBRONCHIAL ULTRASOUND N/A 10/18/2020   Procedure: VIDEO BRONCHOSCOPY WITH ENDOBRONCHIAL ULTRASOUND;  Surgeon: Leslye Peer, MD;  Location: [facility] OR;  Service: Thoracic;  Laterality: N/A;   WISDOM TOOTH EXTRACTION     Family History  Problem Relation Age of Onset   Arthritis Mother    Heart disease Mother    Stroke Mother    Diabetes Father    Heart disease Father    Hyperlipidemia Father    Obesity Father    Cancer Paternal Grandmother    Alcohol abuse Brother    Early death Brother    Kidney disease Brother    Obesity Brother    Diabetes Sister    Obesity Sister    Heart disease Brother    Obesity Brother    Colon cancer Neg Hx    Colon polyps Neg Hx    Esophageal cancer Neg Hx    Rectal cancer Neg Hx    Stomach cancer Neg Hx    Social History   Socioeconomic History   Marital status: Divorced    Spouse name: Not on file   Number of children: Not on file   Years of education: Not on file   Highest education level: Not on file  Occupational History   Not on file  Tobacco Use   Smoking status: Every Day    Current packs/day: 1.00    Average packs/day: 1 pack/day for 48.0 years (48.0 ttl pk-yrs)    Types: Cigarettes   Smokeless tobacco: Never   Tobacco comments:    1 pack smoked per day ARJ 09/19/22  Vaping Use   Vaping status: Former  Substance and Sexual Activity   Alcohol use: No   Drug use: No   Sexual activity: Not on file    Comment:  Tubal Ligation  Other Topics Concern   Not on file  Social History Narrative   Not on file   Social Determinants of Health   Financial Resource Strain: High Risk (04/18/2023)   Overall Financial Resource Strain (CARDIA)    Difficulty of Paying Living Expenses: Very hard  Food Insecurity: Food Insecurity Present (04/18/2023)   Hunger Vital Sign    Worried About Running Out of Food in the Last Year: Often true    Ran Out of Food in the Last Year: Sometimes true  Transportation Needs: No Transportation Needs (04/18/2023)   PRAPARE - Transportation    Lack  of Transportation (Medical): No    Lack of Transportation (Non-Medical): No  Physical Activity: Inactive (04/18/2023)   Exercise Vital Sign    Days of Exercise per Week: 0 days    Minutes of Exercise per Session: 0 min  Stress: Stress Concern Present (04/18/2023)   [employer] of Occupational Health - Occupational Stress Questionnaire    Feeling of Stress : To some extent  Social Connections: Unknown (04/18/2023)   Social Connection and Isolation Panel [NHANES]    Frequency of Communication with Friends and Family: More than three times a week    Frequency of Social Gatherings with Friends and Family: Once a week    Attends Religious Services: Not on Marketing executive or Organizations: No    Attends Banker Meetings: Never    Marital Status: Divorced    Tobacco Counseling Ready to quit: Yes Counseling given: Not Answered Tobacco comments: 1 pack smoked per day ARJ 09/19/22   Clinical Intake:  Pre-visit preparation completed: Yes  Pain : 0-10 Pain Score: 7  Pain Type: Chronic pain Pain Location: Neck Pain Descriptors / Indicators: Constant, Aching, Stabbing Pain Onset: More than a month ago Pain Frequency: Constant  BMI - recorded: 28.47 Nutritional Status: BMI 25 -29 Overweight Nutritional Risks: None Diabetes: No  How often do you need to have someone help you when you read  instructions, pamphlets, or other written materials from your doctor or pharmacy?: 1 - Never  Interpreter Needed?: No  Information entered by :: Donne Anon, CMA   Activities of Daily Living    04/18/2023    9:46 AM 04/16/2023    9:15 AM  In your present state of health, do you have any difficulty performing the following activities:  Hearing? 0 0  Vision? 0 0  Difficulty concentrating or making decisions? 0 0  Walking or climbing stairs? 0 0  Dressing or bathing? 0 0  Doing errands, shopping? 0 0  Preparing Food and eating ? N N  Using the Toilet? N N  In the past six months, have you accidently leaked urine? Y Y  Do you have problems with loss of bowel control? N N  Managing your Medications? N N  Managing your Finances? Malvin Johns  Housekeeping or managing your Housekeeping? N N    Patient Care Team: Copland, Gwenlyn Found, MD as PCP - General (Family Medicine) Maisie Fus, MD as PCP - Cardiology (Cardiology) Wynn Banker Victorino Sparrow, MD as Consulting Physician (Pain Medicine) Sinda Du, MD as Consulting Physician (Ophthalmology) Maisie Fus, MD as Referring Physician (Cardiology)  Indicate any recent Medical Services you may have received from other than [facility] providers in the past year (date may be approximate).     Assessment:   This is a routine wellness examination for Rodney.  Hearing/Vision screen No results found.  Dietary issues and exercise activities discussed:     Goals Addressed   None    Depression Screen    04/25/2023    1:10 PM 01/23/2023   10:46 AM 10/26/2022   10:35 AM 09/21/2022    3:19 PM 09/07/2022    9:26 AM 06/18/2022    8:28 AM 04/19/2022    1:06 PM  PHQ 2/9 Scores  PHQ - 2 Score 0 0 0 0 0 0 0    Fall Risk    04/18/2023    9:46 AM 04/16/2023    9:15 AM 01/23/2023   10:45 AM 10/26/2022   10:35 AM  09/21/2022    3:19 PM  Fall Risk   Falls in the past year? 0 0 0 0 0  Number falls in past yr: 0  0  0  Injury with Fall? 0  0  0  Risk for  fall due to : No Fall Risks      Follow up Falls evaluation completed        MEDICARE RISK AT HOME: Medicare Risk at Home Any stairs in or around the home?: No Home free of loose throw rugs in walkways, pet beds, electrical cords, etc?: Yes Adequate lighting in your home to reduce risk of falls?: Yes Life alert?: No Use of a cane, walker or w/c?: No Grab bars in the bathroom?: No Shower chair or bench in shower?: Yes Elevated toilet seat or a handicapped toilet?: Yes  TIMED UP AND GO:  Was the test performed?  No    Cognitive Function:    03/07/2018    4:08 PM 09/05/2016    9:50 AM  MMSE - Mini Mental State Exam  Orientation to time 5 5  Orientation to Place 5 5  Registration 3 3  Attention/ Calculation 5 5  Recall 3 3  Language- name 2 objects 2 2  Language- repeat 1 1  Language- follow 3 step command 3 3  Language- read & follow direction 1 1  Write a sentence 1 1  Copy design 1 1  Total score 30 30        04/25/2023    1:13 PM 04/19/2022    1:12 PM  6CIT Screen  What Year? 0 points 0 points  What month? 0 points 0 points  What time? 0 points 0 points  Count back from 20 0 points 0 points  Months in reverse 0 points 0 points  Repeat phrase 0 points 0 points  Total Score 0 points 0 points    Immunizations Immunization History  Administered Date(s) Administered   Influenza,inj,Quad PF,6+ Mos 06/11/2018, 06/03/2019   PFIZER(Purple Top)SARS-COV-2 Vaccination 11/30/2019, 12/28/2019   Td 06/18/2022   Tdap 08/28/2011   Zoster Recombinant(Shingrix) 06/03/2019, 08/06/2019    TDAP status: Up to date  Flu Vaccine status: Due, Education has been provided regarding the importance of this vaccine. Advised may receive this vaccine at local pharmacy or Health Dept. Aware to provide a copy of the vaccination record if obtained from local pharmacy or Health Dept. Verbalized acceptance and understanding.  Pneumococcal vaccine status: Due, Education has been provided  regarding the importance of this vaccine. Advised may receive this vaccine at local pharmacy or Health Dept. Aware to provide a copy of the vaccination record if obtained from local pharmacy or Health Dept. Verbalized acceptance and understanding.  Covid-19 vaccine status: Information provided on how to obtain vaccines.   Qualifies for Shingles Vaccine? Yes   Zostavax completed No   Shingrix Completed?: Yes  Screening Tests Health Maintenance  Topic Date Due   COVID-19 Vaccine (3 - Pfizer risk series) 01/25/2020   Medicare Annual Wellness (AWV)  04/20/2023   INFLUENZA VACCINE  03/28/2023   Colonoscopy  10/25/2023   PAP SMEAR-Modifier  06/14/2024   MAMMOGRAM  03/20/2025   DTaP/Tdap/Td (3 - Td or Tdap) 06/18/2032   Hepatitis C Screening  Completed   HIV Screening  Completed   Zoster Vaccines- Shingrix  Completed   HPV VACCINES  Aged Out    Health Maintenance  Health Maintenance Due  Topic Date Due   COVID-19 Vaccine (3 - Pfizer risk series)  01/25/2020   Medicare Annual Wellness (AWV)  04/20/2023   INFLUENZA VACCINE  03/28/2023    Colorectal cancer screening: Type of screening: Colonoscopy. Completed 10/24/20. Repeat every 3 years  Mammogram status: Completed 03/21/23. Repeat every year  Bone Density status: due at age 21  Lung Cancer Screening: (Low Dose CT Chest recommended if Age 47-80 years, 20 pack-year currently smoking OR have quit w/in 15years.) does not qualify.   Additional Screening:  Hepatitis C Screening: does qualify; Completed 12/26/15  Vision Screening: Recommended annual ophthalmology exams for early detection of glaucoma and other disorders of the eye. Is the patient up to date with their annual eye exam?  Yes  Who is the provider or what is the name of the office in which the patient attends annual eye exams? Dr. Sinda Du If pt is not established with a provider, would they like to be referred to a provider to establish care? No .   Dental Screening:  Recommended annual dental exams for proper oral hygiene  Diabetic Foot Exam: N/a  Community Resource Referral / Chronic Care Management: CRR required this visit?  No   CCM required this visit?  No     Plan:     I have personally reviewed and noted the following in the patient's chart:   Medical and social history Use of alcohol, tobacco or illicit drugs  Current medications and supplements including opioid prescriptions. Patient is not currently taking opioid prescriptions. Functional ability and status Nutritional status Physical activity Advanced directives List of other physicians Hospitalizations, surgeries, and ER visits in previous 12 months Vitals Screenings to include cognitive, depression, and falls Referrals and appointments  In addition, I have reviewed and discussed with patient certain preventive protocols, quality metrics, and best practice recommendations. A written personalized care plan for preventive services as well as general preventive health recommendations were provided to patient.     Donne Anon, CMA   04/25/2023   After Visit Summary: (MyChart) Due to this being a telephonic visit, the after visit summary with patients personalized plan was offered to patient via MyChart   Nurse Notes: None

## 2023-04-25 NOTE — Patient Instructions (Signed)
Susan Cortez , Thank you for taking time to come for your Medicare Wellness Visit. I appreciate your ongoing commitment to your health goals. Please review the following plan we discussed and let me know if I can assist you in the future.     This is a list of the screening recommended for you and due dates:  Health Maintenance  Topic Date Due   COVID-19 Vaccine (3 - Pfizer risk series) 01/25/2020   Flu Shot  03/28/2023   Colon Cancer Screening  10/25/2023   Medicare Annual Wellness Visit  04/24/2024   Pap Smear  06/14/2024   Mammogram  03/20/2025   DTaP/Tdap/Td vaccine (3 - Td or Tdap) 06/18/2032   Hepatitis C Screening  Completed   HIV Screening  Completed   Zoster (Shingles) Vaccine  Completed   HPV Vaccine  Aged Out      Next appointment: Follow up in one year for your annual wellness visit.   Preventive Care 40-64 Years, Female Preventive care refers to lifestyle choices and visits with your health care provider that can promote health and wellness. What does preventive care include? A yearly physical exam. This is also called an annual well check. Dental exams once or twice a year. Routine eye exams. Ask your health care provider how often you should have your eyes checked. Personal lifestyle choices, including: Daily care of your teeth and gums. Regular physical activity. Eating a healthy diet. Avoiding tobacco and drug use. Limiting alcohol use. Practicing safe sex. Taking low-dose aspirin daily starting at age 47. Taking vitamin and mineral supplements as recommended by your health care provider. What happens during an annual well check? The services and screenings done by your health care provider during your annual well check will depend on your age, overall health, lifestyle risk factors, and family history of disease. Counseling  Your health care provider may ask you questions about your: Alcohol use. Tobacco use. Drug use. Emotional well-being. Home and  relationship well-being. Sexual activity. Eating habits. Work and work Astronomer. Method of birth control. Menstrual cycle. Pregnancy history. Screening  You may have the following tests or measurements: Height, weight, and BMI. Blood pressure. Lipid and cholesterol levels. These may be checked every 5 years, or more frequently if you are over 46 years old. Skin check. Lung cancer screening. You may have this screening every year starting at age 44 if you have a 30-pack-year history of smoking and currently smoke or have quit within the past 15 years. Fecal occult blood test (FOBT) of the stool. You may have this test every year starting at age 57. Flexible sigmoidoscopy or colonoscopy. You may have a sigmoidoscopy every 5 years or a colonoscopy every 10 years starting at age 62. Hepatitis C blood test. Hepatitis B blood test. Sexually transmitted disease (STD) testing. Diabetes screening. This is done by checking your blood sugar (glucose) after you have not eaten for a while (fasting). You may have this done every 1-3 years. Mammogram. This may be done every 1-2 years. Talk to your health care provider about when you should start having regular mammograms. This may depend on whether you have a family history of breast cancer. BRCA-related cancer screening. This may be done if you have a family history of breast, ovarian, tubal, or peritoneal cancers. Pelvic exam and Pap test. This may be done every 3 years starting at age 49. Starting at age 66, this may be done every 5 years if you have a Pap test in combination  with an HPV test. Bone density scan. This is done to screen for osteoporosis. You may have this scan if you are at high risk for osteoporosis. Discuss your test results, treatment options, and if necessary, the need for more tests with your health care provider. Vaccines  Your health care provider may recommend certain vaccines, such as: Influenza vaccine. This is recommended  every year. Tetanus, diphtheria, and acellular pertussis (Tdap, Td) vaccine. You may need a Td booster every 10 years. Zoster vaccine. You may need this after age 73. Pneumococcal 13-valent conjugate (PCV13) vaccine. You may need this if you have certain conditions and were not previously vaccinated. Pneumococcal polysaccharide (PPSV23) vaccine. You may need one or two doses if you smoke cigarettes or if you have certain conditions. Talk to your health care provider about which screenings and vaccines you need and how often you need them. This information is not intended to replace advice given to you by your health care provider. Make sure you discuss any questions you have with your health care provider. Document Released: 09/09/2015 Document Revised: 05/02/2016 Document Reviewed: 06/14/2015 Elsevier Interactive Patient Education  2017 [employer].    Fall Prevention in the Home Falls can cause injuries. They can happen to people of all ages. There are many things you can do to make your home safe and to help prevent falls. What can I do on the outside of my home? Regularly fix the edges of walkways and driveways and fix any cracks. Remove anything that might make you trip as you walk through a door, such as a raised step or threshold. Trim any bushes or trees on the path to your home. Use bright outdoor lighting. Clear any walking paths of anything that might make someone trip, such as rocks or tools. Regularly check to see if handrails are loose or broken. Make sure that both sides of any steps have handrails. Any raised decks and porches should have guardrails on the edges. Have any leaves, snow, or ice cleared regularly. Use sand or salt on walking paths during winter. Clean up any spills in your garage right away. This includes oil or grease spills. What can I do in the bathroom? Use night lights. Install grab bars by the toilet and in the tub and shower. Do not use towel bars as  grab bars. Use non-skid mats or decals in the tub or shower. If you need to sit down in the shower, use a plastic, non-slip stool. Keep the floor dry. Clean up any water that spills on the floor as soon as it happens. Remove soap buildup in the tub or shower regularly. Attach bath mats securely with double-sided non-slip rug tape. Do not have throw rugs and other things on the floor that can make you trip. What can I do in the bedroom? Use night lights. Make sure that you have a light by your bed that is easy to reach. Do not use any sheets or blankets that are too big for your bed. They should not hang down onto the floor. Have a firm chair that has side arms. You can use this for support while you get dressed. Do not have throw rugs and other things on the floor that can make you trip. What can I do in the kitchen? Clean up any spills right away. Avoid walking on wet floors. Keep items that you use a lot in easy-to-reach places. If you need to reach something above you, use a strong step stool that  has a grab bar. Keep electrical cords out of the way. Do not use floor polish or wax that makes floors slippery. If you must use wax, use non-skid floor wax. Do not have throw rugs and other things on the floor that can make you trip. What can I do with my stairs? Do not leave any items on the stairs. Make sure that there are handrails on both sides of the stairs and use them. Fix handrails that are broken or loose. Make sure that handrails are as long as the stairways. Check any carpeting to make sure that it is firmly attached to the stairs. Fix any carpet that is loose or worn. Avoid having throw rugs at the top or bottom of the stairs. If you do have throw rugs, attach them to the floor with carpet tape. Make sure that you have a light switch at the top of the stairs and the bottom of the stairs. If you do not have them, ask someone to add them for you. What else can I do to help prevent  falls? Wear shoes that: Do not have high heels. Have rubber bottoms. Are comfortable and fit you well. Are closed at the toe. Do not wear sandals. If you use a stepladder: Make sure that it is fully opened. Do not climb a closed stepladder. Make sure that both sides of the stepladder are locked into place. Ask someone to hold it for you, if possible. Clearly mark and make sure that you can see: Any grab bars or handrails. First and last steps. Where the edge of each step is. Use tools that help you move around (mobility aids) if they are needed. These include: Canes. Walkers. Scooters. Crutches. Turn on the lights when you go into a dark area. Replace any light bulbs as soon as they burn out. Set up your furniture so you have a clear path. Avoid moving your furniture around. If any of your floors are uneven, fix them. If there are any pets around you, be aware of where they are. Review your medicines with your doctor. Some medicines can make you feel dizzy. This can increase your chance of falling. Ask your doctor what other things that you can do to help prevent falls. This information is not intended to replace advice given to you by your health care provider. Make sure you discuss any questions you have with your health care provider. Document Released: 06/09/2009 Document Revised: 01/19/2016 Document Reviewed: 09/17/2014 Elsevier Interactive Patient Education  2017 [employer].

## 2023-04-29 ENCOUNTER — Other Ambulatory Visit: Payer: Self-pay | Admitting: Family Medicine

## 2023-04-29 DIAGNOSIS — R000 Tachycardia, unspecified: Secondary | ICD-10-CM

## 2023-05-10 ENCOUNTER — Ambulatory Visit: Payer: 59 | Attending: Internal Medicine | Admitting: Internal Medicine

## 2023-05-10 ENCOUNTER — Encounter: Payer: Self-pay | Admitting: Internal Medicine

## 2023-05-10 VITALS — BP 132/76 | HR 95 | Ht 68.0 in | Wt 191.6 lb

## 2023-05-10 DIAGNOSIS — R002 Palpitations: Secondary | ICD-10-CM

## 2023-05-10 NOTE — Patient Instructions (Signed)
Medication Instructions:  Your physician recommends that you continue on your current medications as directed. Please refer to the Current Medication list given to you today.  *If you need a refill on your cardiac medications before your next appointment, please call your pharmacy*   Lab Work: NONE If you have labs (blood work) drawn today and your tests are completely normal, you will receive your results only by: MyChart Message (if you have MyChart) OR A paper copy in the mail If you have any lab test that is abnormal or we need to change your treatment, we will call you to review the results.   Testing/Procedures: NONE   Follow-Up: At [facility], you and your health needs are our priority.  As part of our continuing mission to provide you with exceptional heart care, we have created designated Provider Care Teams.  These Care Teams include your primary Cardiologist (physician) and Advanced Practice Providers (APPs -  Physician Assistants and Nurse Practitioners) who all work together to provide you with the care you need, when you need it.  We recommend signing up for the patient portal called "MyChart".  Sign up information is provided on this After Visit Summary.  MyChart is used to connect with patients for Virtual Visits (Telemedicine).  Patients are able to view lab/test results, encounter notes, upcoming appointments, etc.  Non-urgent messages can be sent to your provider as well.   To learn more about what you can do with MyChart, go to [email or web address].    Your next appointment:   1 year(s)  Provider:   Maisie Fus, MD

## 2023-05-10 NOTE — Progress Notes (Signed)
Cardiology Office Note:    Date:  05/10/2023   ID:  Susan Cortez, DOB 05-15-1961, MRN [id number]  PCP:  Pearline Cables, MD   [facility] Providers Cardiologist:  Maisie Fus, MD     Referring MD: Pearline Cables, MD   No chief complaint on file. Tachycardia  History of Present Illness:    Susan Cortez is a 62 y.o. female with a hx of limited stage small cell carcinoma diagnosed 09/2020 of the RUL s/p chemoradiation  completed on Spring of 2022, whole brain radiaiton completed 03/28/21 referral for tachycardia   She's prescribed albuterol but she does not use it. She's on metoprolol 12.5 mg XL. She feels weak and dizzy with it. She stays in bed. She is very fatigued. She is working on hydration. She can feel her heart rate racing at times. Her smart watch says sinus rhythm. She denies syncope. She gets some LH/dizzy with standing up.   She denies fevers,  dehydration, pain or anxiety. She has some loss of appetite. She has persistent neuropathy. She's had sinus tachycardia going back to February of 2018. She has not seen a cardiology, no stress test approximately 37 years ago which was normal. No echo. She's had CT scans , showing no significant atherosclerotic calcification.  05/29/2021- Sinus tachycardia HR 113 BPM, RAE  No left chest radiation, ECOG 0 Chemo-cisplatin, etoposide No metastasis  Family Hx: Mother- atrial fibrillation, atrial flutter, CHF, htn, hypothyroidism, CKD, HLD. Father - heart attack s/p PCI in his 66s. Brother had 2 Mis.  Social: smokes pack per day. No etoh, no drug use. She has three children.   Interim Hx: She notes some improvement with metoprolol. She is not planned for further cancer therapy. They are recommending repeat CT scan  Interim hx 05/10/2022: Her port was removed.  Metoprolol is doing well. She has no palpations. She is still smoking.   Interim hx 05/10/2023 She's had a cough for about 2 years. She doing well with her BB.  She is undergoing surveillance CT scans. Blood pressure is ok today  Past Medical History:  Diagnosis Date   Allergy    Arthritis    Basal cell carcinoma 2018   Removed from Right neck per patient    COPD (chronic obstructive pulmonary disease) (HCC) 06/2019   Diverticulosis 2013   Dyslipidemia 03/01/2018   GERD (gastroesophageal reflux disease) 04/2021   HLD (hyperlipidemia)    Hx of adenomatous colonic polyps 2013   OAB (overactive bladder)    Pre-diabetes    Small cell lung cancer (HCC) 11/09/2020   Stroke (HCC) 2022    Past Surgical History:  Procedure Laterality Date   CESAREAN SECTION     X 3   COLONOSCOPY  2013, 2016, 2019   hx colon polyps   DILATION AND CURETTAGE OF UTERUS     FUDUCIAL PLACEMENT Right 10/18/2020   Procedure: PLACEMENT OF FUDUCIAL;  Surgeon: Leslye Peer, MD;  Location: [facility] OR;  Service: Thoracic;  Laterality: Right;   IR IMAGING GUIDED PORT INSERTION  11/29/2020   IR REMOVAL TUN ACCESS W/ PORT W/O FL MOD SED  05/08/2022   SHOULDER ARTHROSCOPY WITH OPEN ROTATOR CUFF REPAIR Right 05/2009   TONSILLECTOMY  1977   TUBAL LIGATION     VIDEO BRONCHOSCOPY WITH ENDOBRONCHIAL NAVIGATION N/A 10/18/2020   Procedure: VIDEO BRONCHOSCOPY WITH ENDOBRONCHIAL NAVIGATION;  Surgeon: Leslye Peer, MD;  Location: [facility] OR;  Service: Thoracic;  Laterality: N/A;   VIDEO BRONCHOSCOPY WITH  ENDOBRONCHIAL ULTRASOUND N/A 10/18/2020   Procedure: VIDEO BRONCHOSCOPY WITH ENDOBRONCHIAL ULTRASOUND;  Surgeon: Leslye Peer, MD;  Location: [facility] OR;  Service: Thoracic;  Laterality: N/A;   WISDOM TOOTH EXTRACTION      Current Medications: No outpatient medications have been marked as taking for the 05/10/23 encounter (Appointment) with Maisie Fus, MD.     Allergies:   Lyrica [pregabalin], Butrans [buprenorphine], Chantix [varenicline], Diclofenac, Doxycycline, Meloxicam, Tolmetin, and Nsaids   Social History   Socioeconomic History   Marital status: Divorced    Spouse name: Not  on file   Number of children: Not on file   Years of education: Not on file   Highest education level: Not on file  Occupational History   Not on file  Tobacco Use   Smoking status: Every Day    Current packs/day: 1.00    Average packs/day: 1 pack/day for 48.0 years (48.0 ttl pk-yrs)    Types: Cigarettes   Smokeless tobacco: Never   Tobacco comments:    1 pack smoked per day ARJ 09/19/22  Vaping Use   Vaping status: Former  Substance and Sexual Activity   Alcohol use: No   Drug use: No   Sexual activity: Not on file    Comment: Tubal Ligation  Other Topics Concern   Not on file  Social History Narrative   Not on file   Social Determinants of Health   Financial Resource Strain: High Risk (04/18/2023)   Overall Financial Resource Strain (CARDIA)    Difficulty of Paying Living Expenses: Very hard  Food Insecurity: Food Insecurity Present (04/18/2023)   Hunger Vital Sign    Worried About Running Out of Food in the Last Year: Often true    Ran Out of Food in the Last Year: Sometimes true  Transportation Needs: No Transportation Needs (04/18/2023)   PRAPARE - Administrator, Civil Service (Medical): No    Lack of Transportation (Non-Medical): No  Physical Activity: Inactive (04/18/2023)   Exercise Vital Sign    Days of Exercise per Week: 0 days    Minutes of Exercise per Session: 0 min  Stress: Stress Concern Present (04/18/2023)   [employer] of Occupational Health - Occupational Stress Questionnaire    Feeling of Stress : To some extent  Social Connections: Unknown (04/18/2023)   Social Connection and Isolation Panel [NHANES]    Frequency of Communication with Friends and Family: More than three times a week    Frequency of Social Gatherings with Friends and Family: Once a week    Attends Religious Services: Not on Marketing executive or Organizations: No    Attends Banker Meetings: Never    Marital Status: Divorced     Family  History: The patient's family history includes Alcohol abuse in her brother; Arthritis in her mother; Cancer in her paternal grandmother; Diabetes in her father and sister; Early death in her brother; Heart disease in her brother, father, and mother; Hyperlipidemia in her father; Kidney disease in her brother; Obesity in her brother, brother, father, and sister; Stroke in her mother. There is no history of Colon cancer, Colon polyps, Esophageal cancer, Rectal cancer, or Stomach cancer.  ROS:   Please see the history of present illness.     All other systems reviewed and are negative.  EKGs/Labs/Other Studies Reviewed:    The following studies were reviewed today:   EKG:  EKG is  ordered today.  The ekg ordered  today demonstrates   07/19/2021 - Sinus tachycardia HR 114 bpm  EKG Interpretation Date/Time:  Friday May 10 2023 10:57:48 EDT Ventricular Rate:  95 PR Interval:  148 QRS Duration:  88 QT Interval:  346 QTC Calculation: 434 R Axis:   78  Text Interpretation: Normal sinus rhythm Right atrial enlargement When compared with ECG of 29-May-2021 10:04, No significant change was found Confirmed by Carolan Clines ([id number]) on 05/10/2023 11:03:27 AM   Recent Labs: 06/18/2022: TSH 1.42 03/19/2023: ALT 22; BUN 9; Creatinine 0.80; Hemoglobin 14.5; Platelet Count 330; Potassium 4.2; Sodium 139  Recent Lipid Panel    Component Value Date/Time   CHOL 155 06/18/2022 0854   TRIG 151.0 (H) 06/18/2022 0854   HDL 51.70 06/18/2022 0854   CHOLHDL 3 06/18/2022 0854   VLDL 30.2 06/18/2022 0854   LDLCALC 73 06/18/2022 0854   LDLCALC 94 06/08/2020 1050     Risk Assessment/Calculations:    06/21/2022 CAC score of 0      Physical Exam:    VS:   Vitals:   05/10/23 1059  BP: 132/76  Pulse: 95  SpO2: 96%     LMP  (LMP Unknown)     Wt Readings from Last 3 Encounters:  04/18/23 190 lb (86.2 kg)  04/24/23 190 lb (86.2 kg)  03/25/23 191 lb 1.6 oz (86.7 kg)     GEN:  Well  nourished, well developed in no acute distress.  HEENT: Normal NECK: No JVD; No carotid bruits LYMPHATICS: No lymphadenopathy CARDIAC: tachycardia, no murmurs, rubs, gallops RESPIRATORY:  Clear to auscultation without rales, wheezing or rhonchi  ABDOMEN: Soft, non-tender, non-distended MUSCULOSKELETAL:  No edema; No deformity  SKIN: Warm and dry NEUROLOGIC:  Alert and oriented x 3 PSYCHIATRIC:  Normal affect   ASSESSMENT:    #Sinus tachycardia: She has persistent sinus tachycardia. Can be related to being intravascularly dry with orthostatic symptoms; however she's had high rates going back to 2018. No medications are contributing. She has no structural heart disease. Malignancy can increase sympathetic tone, this may contributeThis may be due to inappropriate sinus tachycardia. She continues to smoke, this contributes as well. Will continue metop 12.5 mg XL daily.  #Cardio-onc:  CVD Risk: Her coronary disease risk is low with CAC of 0.  Cardiotoxicity risk: low Cisplatin is associated with vascular endothelial dysfunction and can increase CVD risk, study population are patients with testicular cancer. Sheria Lang et al. Comprehensive Characterization of the Vascular Effects of Cisplatin-Based Chemotherapy in Patients With Testicular Cancer. JACC Cardio-Onc 2020). Not a significant risk.   Smoking Cessation: patch and chantix didn't work. Did a program at [facility] but stopped with COVID. Discussed utilizing smoking cessation hotline to help   PLAN:    In order of problems listed above:   Follow up 12 months, assessing heart rates      Medication Adjustments/Labs and Tests Ordered: Current medicines are reviewed at length with the patient today.  Concerns regarding medicines are outlined above.   Signed, Maisie Fus, MD  05/10/2023 10:36 AM    [facility]

## 2023-05-30 ENCOUNTER — Other Ambulatory Visit: Payer: Self-pay | Admitting: Physician Assistant

## 2023-06-03 ENCOUNTER — Telehealth: Payer: Self-pay | Admitting: Specialist

## 2023-06-03 NOTE — Telephone Encounter (Signed)
Patient would like her script for tylenol - 4 called into pharmacy on [address] as they now carry it. Please call patient. Shirlean Mylar, MHA, OT/L [phone]

## 2023-06-04 ENCOUNTER — Telehealth: Payer: Self-pay | Admitting: Internal Medicine

## 2023-06-18 ENCOUNTER — Ambulatory Visit (HOSPITAL_COMMUNITY)
Admission: RE | Admit: 2023-06-18 | Discharge: 2023-06-18 | Disposition: A | Discharge status: Home / Self Care | Payer: 59 | Source: Ambulatory Visit | Attending: Internal Medicine | Admitting: Internal Medicine

## 2023-06-18 ENCOUNTER — Inpatient Hospital Stay: Payer: 59

## 2023-06-18 ENCOUNTER — Inpatient Hospital Stay: Payer: 59 | Attending: Internal Medicine

## 2023-06-18 DIAGNOSIS — K2100 Gastro-esophageal reflux disease with esophagitis, without bleeding: Secondary | ICD-10-CM | POA: Diagnosis not present

## 2023-06-18 DIAGNOSIS — Z923 Personal history of irradiation: Secondary | ICD-10-CM | POA: Insufficient documentation

## 2023-06-18 DIAGNOSIS — I700 Atherosclerosis of aorta: Secondary | ICD-10-CM | POA: Insufficient documentation

## 2023-06-18 DIAGNOSIS — D3502 Benign neoplasm of left adrenal gland: Secondary | ICD-10-CM | POA: Diagnosis not present

## 2023-06-18 DIAGNOSIS — Z9221 Personal history of antineoplastic chemotherapy: Secondary | ICD-10-CM | POA: Diagnosis not present

## 2023-06-18 DIAGNOSIS — C3490 Malignant neoplasm of unspecified part of unspecified bronchus or lung: Secondary | ICD-10-CM

## 2023-06-18 DIAGNOSIS — C3411 Malignant neoplasm of upper lobe, right bronchus or lung: Secondary | ICD-10-CM | POA: Insufficient documentation

## 2023-06-18 DIAGNOSIS — J432 Centrilobular emphysema: Secondary | ICD-10-CM | POA: Insufficient documentation

## 2023-06-18 DIAGNOSIS — Z860101 Personal history of adenomatous and serrated colon polyps: Secondary | ICD-10-CM | POA: Diagnosis not present

## 2023-06-18 DIAGNOSIS — Z79899 Other long term (current) drug therapy: Secondary | ICD-10-CM | POA: Diagnosis not present

## 2023-06-18 DIAGNOSIS — E785 Hyperlipidemia, unspecified: Secondary | ICD-10-CM | POA: Diagnosis not present

## 2023-06-18 DIAGNOSIS — Z8673 Personal history of transient ischemic attack (TIA), and cerebral infarction without residual deficits: Secondary | ICD-10-CM | POA: Insufficient documentation

## 2023-06-18 DIAGNOSIS — J449 Chronic obstructive pulmonary disease, unspecified: Secondary | ICD-10-CM | POA: Insufficient documentation

## 2023-06-18 DIAGNOSIS — Z85828 Personal history of other malignant neoplasm of skin: Secondary | ICD-10-CM | POA: Insufficient documentation

## 2023-06-18 LAB — CBC WITH DIFFERENTIAL (CANCER CENTER ONLY)
Abs Immature Granulocytes: 0.02 10*3/uL (ref 0.00–0.07)
Basophils Absolute: 0.1 10*3/uL (ref 0.0–0.1)
Basophils Relative: 1 %
Eosinophils Absolute: 0.1 10*3/uL (ref 0.0–0.5)
Eosinophils Relative: 1 %
HCT: 42.9 % (ref 36.0–46.0)
Hemoglobin: 14.7 g/dL (ref 12.0–15.0)
Immature Granulocytes: 0 %
Lymphocytes Relative: 24 %
Lymphs Abs: 1.7 10*3/uL (ref 0.7–4.0)
MCH: 32.4 pg (ref 26.0–34.0)
MCHC: 34.3 g/dL (ref 30.0–36.0)
MCV: 94.5 fL (ref 80.0–100.0)
Monocytes Absolute: 0.5 10*3/uL (ref 0.1–1.0)
Monocytes Relative: 7 %
Neutro Abs: 4.7 10*3/uL (ref 1.7–7.7)
Neutrophils Relative %: 67 %
Platelet Count: 309 10*3/uL (ref 150–400)
RBC: 4.54 MIL/uL (ref 3.87–5.11)
RDW: 14 % (ref 11.5–15.5)
WBC Count: 7.1 10*3/uL (ref 4.0–10.5)
nRBC: 0 % (ref 0.0–0.2)

## 2023-06-18 LAB — CMP (CANCER CENTER ONLY)
ALT: 16 U/L (ref 0–44)
AST: 18 U/L (ref 15–41)
Albumin: 4.3 g/dL (ref 3.5–5.0)
Alkaline Phosphatase: 51 U/L (ref 38–126)
Anion gap: 5 (ref 5–15)
BUN: 7 mg/dL — ABNORMAL LOW (ref 8–23)
CO2: 30 mmol/L (ref 22–32)
Calcium: 9.8 mg/dL (ref 8.9–10.3)
Chloride: 103 mmol/L (ref 98–111)
Creatinine: 0.75 mg/dL (ref 0.44–1.00)
GFR, Estimated: >60 mL/min (ref >60)
Glucose, Bld: 117 mg/dL — ABNORMAL HIGH (ref 70–99)
Potassium: 3.9 mmol/L (ref 3.5–5.1)
Sodium: 138 mmol/L (ref 135–145)
Total Bilirubin: 0.4 mg/dL (ref 0.3–1.2)
Total Protein: 7.1 g/dL (ref 6.5–8.1)

## 2023-06-18 MED ORDER — IOHEXOL 300 MG/ML  SOLN
75.0000 mL | Freq: Once | INTRAMUSCULAR | Status: AC | PRN
  Administered 2023-06-18: 75 mL via INTRAVENOUS

## 2023-06-18 MED ORDER — SODIUM CHLORIDE (PF) 0.9 % IJ SOLN
INTRAMUSCULAR | Status: AC
  Filled 2023-06-18: qty 50

## 2023-06-19 ENCOUNTER — Other Ambulatory Visit: Payer: Self-pay | Admitting: Radiation Oncology

## 2023-06-19 ENCOUNTER — Encounter: Payer: Self-pay | Admitting: Radiation Oncology

## 2023-06-19 DIAGNOSIS — F40240 Claustrophobia: Secondary | ICD-10-CM

## 2023-06-19 MED ORDER — LORAZEPAM 0.5 MG PO TABS: ORAL_TABLET | ORAL | 0 refills | Status: DC

## 2023-06-21 NOTE — Progress Notes (Unsigned)
[facility] [address] [place], [place] [address] [phone] [phone]  Date:  06/24/2023   Name:  TANEHA VALENTINI   DOB:  09-Nov-1960   MRN:  [id number]  PCP:  Pearline Cables, MD    Chief Complaint: No chief complaint on file.   History of Present Illness:  BANESSA CRINCOLI is a 62 y.o. very pleasant female patient who presents with the following:  Pt seen today for a CPE Last seen by myself about one year ago - at that time she had completed radiation for possible skull mets from her lung cancer History of cervical spinal stenosis, lower back pain, intermittent claudication, dyslipidemia, prediabetes. 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 She has questionable metastases to the skull  At our last visit Tiana was feeling better having completed her radiation treatment and has lost about 45 lbs  Colon due in 2027 Mammo done in July Some labwork done in July   Seen by cardiology last month:  #Sinus tachycardia: She has persistent sinus tachycardia. Can be related to being intravascularly dry with orthostatic symptoms; however she's had high rates going back to 2018. No medications are contributing. She has no structural heart disease. Malignancy can increase sympathetic tone, this may contributeThis may be due to inappropriate sinus tachycardia. She continues to smoke, this contributes as well. Will continue metop 12.5 mg XL daily. #Cardio-onc:  CVD Risk: Her coronary disease risk is low with CAC of 0.  Cardiotoxicity risk: low Cisplatin is associated with vascular endothelial dysfunction and can increase CVD risk, study population are patients with testicular cancer. Sheria Lang et  al. Comprehensive Characterization of the Vascular Effects of Cisplatin-Based Chemotherapy in Patients With Testicular Cancer. JACC Cardio-Onc 2020). Not a significant risk.  Smoking Cessation: patch and chantix didn't work. Did a program at [facility] but stopped with COVID. Discussed utilizing smoking cessation hotline to help  Seen by hematology in July: ASSESSMENT AND PLAN: This is a very pleasant 62 years old white female with limited stage (T1c, N2, M0) small cell lung cancer presented with right upper lobe and suprahilar mass in addition to mediastinal lymphadenopathy diagnosed in February 2022.  The patient underwent systemic chemotherapy with cisplatin 80 mg/M2 on day 1 and etoposide 100 mg/M2 on days 1, 2 and 3 every 3 weeks status post 4 cycles. This was concurrent with radiotherapy followed by prophylactic cranial irradiation. The patient is currently on observation and she is feeling fine with no concerning complaints. She had repeat CT scan of the chest performed on March 17, 2023 but unfortunately the final report is still pending.  I personally and independently reviewed the scan images and discussed the result with the patient today. I do not see any concerning findings for disease recurrence or metastasis except for increased soft tissue density in the right hilar region that may be related to the previous radiotherapy but I will wait for the final report for confirmation. I recommended for the patient to continue on observation with repeat CT scan of the chest in 6 months.  Patient Active Problem List   Diagnosis Date Noted   Tobacco use 06/01/2021   Tachycardia 05/29/2021   Cough 03/28/2021   Port-A-Cath in place  01/02/2021   Chemotherapy induced neutropenia (HCC) 01/02/2021   COPD, mild (HCC) 12/19/2020   Encounter for antineoplastic chemotherapy 12/05/2020   Goals of care, counseling/discussion 11/09/2020   Small cell carcinoma of right lung (HCC) 11/09/2020   Mediastinal  adenopathy 10/18/2020   Pulmonary nodule 1 cm or greater in diameter 06/28/2020   Encounter for screening for lung cancer 01/04/2020   Porokeratosis 12/02/2019   Plantar flexed metatarsal bone of left foot 12/02/2019   Plantar flexed metatarsal bone of right foot 12/02/2019   Prediabetes 05/31/2019   Dyslipidemia 03/01/2018   De Quervain's disease (tenosynovitis) 03/28/2017   Chronic bilateral low back pain without sciatica 03/28/2017   Sebaceous hyperplasia of face 06/26/2016   Chronic right shoulder pain 01/02/2016   Adenoma of left adrenal gland 12/26/2015   Intermittent claudication (HCC) 12/26/2015   Rotator cuff (capsule) sprain 03/26/2014   Spinal stenosis in cervical region 03/26/2014   Chronic pain 03/26/2014    Past Medical History:  Diagnosis Date   Allergy    Arthritis    Basal cell carcinoma 2018   Removed from Right neck per patient    COPD (chronic obstructive pulmonary disease) (HCC) 06/2019   Diverticulosis 2013   Dyslipidemia 03/01/2018   GERD (gastroesophageal reflux disease) 04/2021   HLD (hyperlipidemia)    Hx of adenomatous colonic polyps 2013   OAB (overactive bladder)    Pre-diabetes    Small cell lung cancer (HCC) 11/09/2020   Stroke (HCC) 2022    Past Surgical History:  Procedure Laterality Date   CESAREAN SECTION     X 3   COLONOSCOPY  2013, 2016, 2019   hx colon polyps   DILATION AND CURETTAGE OF UTERUS     FUDUCIAL PLACEMENT Right 10/18/2020   Procedure: PLACEMENT OF FUDUCIAL;  Surgeon: Leslye Peer, MD;  Location: [facility] OR;  Service: Thoracic;  Laterality: Right;   IR IMAGING GUIDED PORT INSERTION  11/29/2020   IR REMOVAL TUN ACCESS W/ PORT W/O FL MOD SED  05/08/2022   SHOULDER ARTHROSCOPY WITH OPEN ROTATOR CUFF REPAIR Right 05/2009   TONSILLECTOMY  1977   TUBAL LIGATION     VIDEO BRONCHOSCOPY WITH ENDOBRONCHIAL NAVIGATION N/A 10/18/2020   Procedure: VIDEO BRONCHOSCOPY WITH ENDOBRONCHIAL NAVIGATION;  Surgeon: Leslye Peer, MD;  Location:  [facility] OR;  Service: Thoracic;  Laterality: N/A;   VIDEO BRONCHOSCOPY WITH ENDOBRONCHIAL ULTRASOUND N/A 10/18/2020   Procedure: VIDEO BRONCHOSCOPY WITH ENDOBRONCHIAL ULTRASOUND;  Surgeon: Leslye Peer, MD;  Location: [facility] OR;  Service: Thoracic;  Laterality: N/A;   WISDOM TOOTH EXTRACTION      Social History   Tobacco Use   Smoking status: Every Day    Current packs/day: 1.00    Average packs/day: 1 pack/day for 48.0 years (48.0 ttl pk-yrs)    Types: Cigarettes   Smokeless tobacco: Never   Tobacco comments:    1 pack smoked per day ARJ 09/19/22  Vaping Use   Vaping status: Former  Substance Use Topics   Alcohol use: No   Drug use: No    Family History  Problem Relation Age of Onset   Arthritis Mother    Heart disease Mother    Stroke Mother    Diabetes Father    Heart disease Father    Hyperlipidemia Father    Obesity Father    Cancer Paternal Grandmother    Alcohol abuse Brother    Early death Brother    Kidney disease Brother    Obesity Brother  Diabetes Sister    Obesity Sister    Heart disease Brother    Obesity Brother    Colon cancer Neg Hx    Colon polyps Neg Hx    Esophageal cancer Neg Hx    Rectal cancer Neg Hx    Stomach cancer Neg Hx     Allergies  Allergen Reactions   Lyrica [Pregabalin] Swelling    Headache,severe eye pain, swelling   Butrans [Buprenorphine] Itching    Itching and redness at the area of the patch.   Chantix [Varenicline] Other (See Comments)    Strange dreams/hallucinations   Diclofenac Swelling   Doxycycline Nausea Only   Meloxicam Swelling   Tolmetin Other (See Comments)    Stomach pain   Nsaids Other (See Comments)    Stomach pain    Medication list has been reviewed and updated.  Current Outpatient Medications on File Prior to Visit  Medication Sig Dispense Refill   acetaminophen-codeine (TYLENOL #4) 300-60 MG tablet Take 1 tablet by mouth daily as needed for moderate pain. 30 tablet 2   azithromycin (ZITHROMAX) 250  MG tablet Use as instructed 6 each 0   CALCIUM PO Take 650 mg by mouth daily.     Cholecalciferol (VITAMIN D3) 125 MCG (5000 UT) CAPS Take 5,000 Units by mouth daily.     Cyanocobalamin (B-12) 100 MCG TABS      LORazepam (ATIVAN) 0.5 MG tablet TAKE 1 TABLET 30 MINUTES PRIOR TO MRI OR RADIATION 5 tablet 0   lovastatin (MEVACOR) 20 MG tablet Take 1 tablet (20 mg total) by mouth at bedtime. 90 tablet 1   metoprolol succinate (TOPROL-XL) 25 MG 24 hr tablet TAKE 1/2 TABLET BY MOUTH DAILY 45 tablet 3   Multiple Vitamins-Minerals (MULTIVITAMIN ADULT, MINERALS,) TABS      No current facility-administered medications on file prior to visit.    Review of Systems:  As per HPI- otherwise negative.   Physical Examination: There were no vitals filed for this visit. There were no vitals filed for this visit. There is no height or weight on file to calculate BMI. Ideal Body Weight:    GEN: no acute distress. HEENT: Atraumatic, Normocephalic.  Ears and Nose: No external deformity. CV: RRR, No M/G/R. No JVD. No thrill. No extra heart sounds. PULM: CTA B, no wheezes, crackles, rhonchi. No retractions. No resp. distress. No accessory muscle use. ABD: S, NT, ND, +BS. No rebound. No HSM. EXTR: No c/c/e PSYCH: Normally interactive. Conversant.    Assessment and Plan: ***  Signed Abbe Amsterdam, MD

## 2023-06-24 ENCOUNTER — Ambulatory Visit (INDEPENDENT_AMBULATORY_CARE_PROVIDER_SITE_OTHER): Payer: 59 | Admitting: Family Medicine

## 2023-06-24 ENCOUNTER — Encounter: Payer: Self-pay | Admitting: Family Medicine

## 2023-06-24 VITALS — BP 110/62 | HR 105 | Temp 97.9°F | Resp 18 | Ht 69.0 in | Wt 189.2 lb

## 2023-06-24 DIAGNOSIS — I739 Peripheral vascular disease, unspecified: Secondary | ICD-10-CM | POA: Diagnosis not present

## 2023-06-24 DIAGNOSIS — L608 Other nail disorders: Secondary | ICD-10-CM | POA: Diagnosis not present

## 2023-06-24 DIAGNOSIS — C3491 Malignant neoplasm of unspecified part of right bronchus or lung: Secondary | ICD-10-CM

## 2023-06-24 DIAGNOSIS — R7303 Prediabetes: Secondary | ICD-10-CM | POA: Diagnosis not present

## 2023-06-24 DIAGNOSIS — Z0000 Encounter for general adult medical examination without abnormal findings: Secondary | ICD-10-CM

## 2023-06-24 DIAGNOSIS — Z1329 Encounter for screening for other suspected endocrine disorder: Secondary | ICD-10-CM

## 2023-06-24 DIAGNOSIS — R0982 Postnasal drip: Secondary | ICD-10-CM | POA: Diagnosis not present

## 2023-06-24 DIAGNOSIS — E785 Hyperlipidemia, unspecified: Secondary | ICD-10-CM | POA: Diagnosis not present

## 2023-06-24 LAB — LIPID PANEL
Cholesterol: 179 mg/dL (ref 0–200)
HDL: 56.3 mg/dL (ref >39.00)
LDL Cholesterol: 85 mg/dL (ref 0–99)
NonHDL: 122.64
Total CHOL/HDL Ratio: 3
Triglycerides: 187 mg/dL — ABNORMAL HIGH (ref 0.0–149.0)
VLDL: 37.4 mg/dL (ref 0.0–40.0)

## 2023-06-24 LAB — TSH: TSH: 3.17 u[IU]/mL (ref 0.35–5.50)

## 2023-06-24 LAB — HEMOGLOBIN A1C: Hgb A1c MFr Bld: 5.7 % (ref 4.6–6.5)

## 2023-06-24 LAB — FERRITIN: Ferritin: 161.5 ng/mL (ref 10.0–291.0)

## 2023-06-24 MED ORDER — IPRATROPIUM BROMIDE 0.06 % NA SOLN: 2.0000 | Freq: Four times a day (QID) | NASAL | 12 refills | Status: DC

## 2023-06-24 NOTE — Patient Instructions (Addendum)
Good to see you today- I will be in touch with your labs  Recommend working on smoking cessation or at least cut back!   Try to lose the weight you have picked up with diet and exercise  Recommend covid booster if not up to date I hope the nasal spray will help you with your post nasal drainage

## 2023-06-25 ENCOUNTER — Inpatient Hospital Stay (HOSPITAL_BASED_OUTPATIENT_CLINIC_OR_DEPARTMENT_OTHER): Payer: 59 | Admitting: Internal Medicine

## 2023-06-25 VITALS — BP 139/78 | HR 98 | Temp 98.2°F | Resp 18 | Wt 190.5 lb

## 2023-06-25 DIAGNOSIS — Z923 Personal history of irradiation: Secondary | ICD-10-CM | POA: Diagnosis not present

## 2023-06-25 DIAGNOSIS — K2100 Gastro-esophageal reflux disease with esophagitis, without bleeding: Secondary | ICD-10-CM | POA: Diagnosis not present

## 2023-06-25 DIAGNOSIS — Z860101 Personal history of adenomatous and serrated colon polyps: Secondary | ICD-10-CM | POA: Diagnosis not present

## 2023-06-25 DIAGNOSIS — J449 Chronic obstructive pulmonary disease, unspecified: Secondary | ICD-10-CM | POA: Diagnosis not present

## 2023-06-25 DIAGNOSIS — C3411 Malignant neoplasm of upper lobe, right bronchus or lung: Secondary | ICD-10-CM | POA: Diagnosis not present

## 2023-06-25 DIAGNOSIS — C3490 Malignant neoplasm of unspecified part of unspecified bronchus or lung: Secondary | ICD-10-CM | POA: Diagnosis not present

## 2023-06-25 DIAGNOSIS — D3502 Benign neoplasm of left adrenal gland: Secondary | ICD-10-CM | POA: Diagnosis not present

## 2023-06-25 DIAGNOSIS — J432 Centrilobular emphysema: Secondary | ICD-10-CM | POA: Diagnosis not present

## 2023-06-25 DIAGNOSIS — Z79899 Other long term (current) drug therapy: Secondary | ICD-10-CM | POA: Diagnosis not present

## 2023-06-25 DIAGNOSIS — Z9221 Personal history of antineoplastic chemotherapy: Secondary | ICD-10-CM | POA: Diagnosis not present

## 2023-06-25 DIAGNOSIS — Z85828 Personal history of other malignant neoplasm of skin: Secondary | ICD-10-CM | POA: Diagnosis not present

## 2023-06-25 DIAGNOSIS — Z8673 Personal history of transient ischemic attack (TIA), and cerebral infarction without residual deficits: Secondary | ICD-10-CM | POA: Diagnosis not present

## 2023-06-25 DIAGNOSIS — I700 Atherosclerosis of aorta: Secondary | ICD-10-CM | POA: Diagnosis not present

## 2023-06-25 DIAGNOSIS — E785 Hyperlipidemia, unspecified: Secondary | ICD-10-CM | POA: Diagnosis not present

## 2023-06-25 NOTE — Progress Notes (Signed)
[facility] [phone]   [phone]  OFFICE PROGRESS NOTE  Copland, Susan Found, MD [address] [place] [address]   DIAGNOSIS: Limited stage (T1c, N2, M0) Small Cell Lung Cancer. She presented with a right upper lobe suprahilar nodule/adenopathy, right paratracheal lymph node, and a hypermetabolic right upper lobe nodule. She was diagnosed in February 2022.    PRIOR THERAPY:   1) Systemic chemotherapy with cisplatin 80 mg/m2 on day 1, etoposide 100 mg/m2 on days 1, 2, and 3 IV every 3 weeks with concurrent radiation.  Last dose 02/01/21. Status post 4 cycles.  2) prophylactic cranial irradiation under the care of Dr. Mitzi Hansen.  Last treatment on 03/28/2021   CURRENT THERAPY: Observation   INTERVAL HISTORY: Susan Cortez 62 y.o. female returns to the clinic today for follow-up visit.Discussed the use of AI scribe software for clinical note transcription with the patient, who gave verbal consent to proceed.  History of Present Illness   Susan Cortez, a 62 year old patient, was diagnosed with limited stage small cell lung cancer in February 2022. The patient underwent four cycles of chemotherapy with cisplatin and etoposide, along with concurrent radiation therapy. Prophylactic cranial radiation was also administered, with all treatments completed by August 2022. Since then, the patient has been under observation.  Three months prior to the current consultation, a suspicious area in the lung was identified, prompting a sooner follow-up scan. The patient reports no new symptoms since the last visit. However, she has been experiencing coughing with white mucus, sometimes in large amounts. There has been no hemoptysis. The patient denies any recent weight loss; in fact, she reports a weight gain of fifteen pounds in the spring. There have been no episodes of nausea or vomiting.  The patient also mentions a change in her fingernails, which  have become crooked. Additionally, she reports a history of esophagitis, likely secondary to the radiation therapy, which has resulted in segmental wall thickening in the esophagus.       MEDICAL HISTORY: Past Medical History:  Diagnosis Date   Allergy    Arthritis    Basal cell carcinoma 2018   Removed from Right neck per patient    COPD (chronic obstructive pulmonary disease) (HCC) 06/2019   Diverticulosis 2013   Dyslipidemia 03/01/2018   GERD (gastroesophageal reflux disease) 04/2021   HLD (hyperlipidemia)    Hx of adenomatous colonic polyps 2013   OAB (overactive bladder)    Pre-diabetes    Small cell lung cancer (HCC) 11/09/2020   Stroke (HCC) 2022    ALLERGIES:  is allergic to lyrica [pregabalin], butrans [buprenorphine], chantix [varenicline], diclofenac, doxycycline, meloxicam, tolmetin, and nsaids.  MEDICATIONS:  Current Outpatient Medications  Medication Sig Dispense Refill   acetaminophen-codeine (TYLENOL #4) 300-60 MG tablet Take 1 tablet by mouth daily as needed for moderate pain. 30 tablet 2   CALCIUM PO Take 650 mg by mouth daily.     Cholecalciferol (VITAMIN D3) 125 MCG (5000 UT) CAPS Take 5,000 Units by mouth daily.     Cyanocobalamin (B-12) 100 MCG TABS      ipratropium (ATROVENT) 0.06 % nasal spray Place 2 sprays into both nostrils 4 (four) times daily. Use as needed for nasal drainage 15 mL 12   LORazepam (ATIVAN) 0.5 MG tablet TAKE 1 TABLET 30 MINUTES PRIOR TO MRI OR RADIATION 5 tablet 0   lovastatin (MEVACOR) 20 MG tablet Take 1 tablet (20 mg total) by mouth at  bedtime. 90 tablet 1   metoprolol succinate (TOPROL-XL) 25 MG 24 hr tablet TAKE 1/2 TABLET BY MOUTH DAILY 45 tablet 3   Multiple Vitamins-Minerals (MULTIVITAMIN ADULT, MINERALS,) TABS      MYRBETRIQ 50 MG TB24 tablet Take 50 mg by mouth daily.     No current facility-administered medications for this visit.    SURGICAL HISTORY:  Past Surgical History:  Procedure Laterality Date   CESAREAN  SECTION     X 3   COLONOSCOPY  2013, 2016, 2019   hx colon polyps   DILATION AND CURETTAGE OF UTERUS     FUDUCIAL PLACEMENT Right 10/18/2020   Procedure: PLACEMENT OF FUDUCIAL;  Surgeon: Leslye Peer, MD;  Location: [facility] OR;  Service: Thoracic;  Laterality: Right;   IR IMAGING GUIDED PORT INSERTION  11/29/2020   IR REMOVAL TUN ACCESS W/ PORT W/O FL MOD SED  05/08/2022   SHOULDER ARTHROSCOPY WITH OPEN ROTATOR CUFF REPAIR Right 05/2009   TONSILLECTOMY  1977   TUBAL LIGATION     VIDEO BRONCHOSCOPY WITH ENDOBRONCHIAL NAVIGATION N/A 10/18/2020   Procedure: VIDEO BRONCHOSCOPY WITH ENDOBRONCHIAL NAVIGATION;  Surgeon: Leslye Peer, MD;  Location: [facility] OR;  Service: Thoracic;  Laterality: N/A;   VIDEO BRONCHOSCOPY WITH ENDOBRONCHIAL ULTRASOUND N/A 10/18/2020   Procedure: VIDEO BRONCHOSCOPY WITH ENDOBRONCHIAL ULTRASOUND;  Surgeon: Leslye Peer, MD;  Location: [facility] OR;  Service: Thoracic;  Laterality: N/A;   WISDOM TOOTH EXTRACTION      REVIEW OF SYSTEMS:  A comprehensive review of systems was negative except for: Respiratory: positive for cough   PHYSICAL EXAMINATION: General appearance: alert, cooperative, and no distress Head: Normocephalic, without obvious abnormality, atraumatic Neck: no adenopathy, no JVD, supple, symmetrical, trachea midline, and thyroid not enlarged, symmetric, no tenderness/mass/nodules Lymph nodes: Cervical, supraclavicular, and axillary nodes normal. Resp: clear to auscultation bilaterally Back: symmetric, no curvature. ROM normal. No CVA tenderness. Cardio: regular rate and rhythm, S1, S2 normal, no murmur, click, rub or gallop GI: soft, non-tender; bowel sounds normal; no masses,  no organomegaly Extremities: extremities normal, atraumatic, no cyanosis or edema  ECOG PERFORMANCE STATUS: 1 - Symptomatic but completely ambulatory  Blood pressure 139/78, pulse 98, temperature 98.2 F (36.8 C), temperature source Oral, resp. rate 18, weight 190 lb 8 oz (86.4 kg), SpO2  100%.  LABORATORY DATA: Lab Results  Component Value Date   WBC 7.1 06/18/2023   HGB 14.7 06/18/2023   HCT 42.9 06/18/2023   MCV 94.5 06/18/2023   PLT 309 06/18/2023      Chemistry      Component Value Date/Time   NA 138 06/18/2023 1114   K 3.9 06/18/2023 1114   CL 103 06/18/2023 1114   CO2 30 06/18/2023 1114   BUN 7 (L) 06/18/2023 1114   CREATININE 0.75 06/18/2023 1114   CREATININE 0.65 06/08/2020 1050      Component Value Date/Time   CALCIUM 9.8 06/18/2023 1114   ALKPHOS 51 06/18/2023 1114   AST 18 06/18/2023 1114   ALT 16 06/18/2023 1114   BILITOT 0.4 06/18/2023 1114       RADIOGRAPHIC STUDIES: CT Chest W Contrast  Result Date: 06/24/2023 CLINICAL DATA:  Small cell lung cancer restaging EXAM: CT CHEST WITH CONTRAST TECHNIQUE: Multidetector CT imaging of the chest was performed during intravenous contrast administration. RADIATION DOSE REDUCTION: This exam was performed according to the departmental dose-optimization program which includes automated exposure control, adjustment of the mA and/or kV according to patient size and/or use of iterative reconstruction technique. CONTRAST:  75mL OMNIPAQUE IOHEXOL 300 MG/ML  SOLN COMPARISON:  03/20/2023 FINDINGS: Cardiovascular: Atheromatous vascular calcification of the aortic arch and branch vessels. Mediastinum/Nodes: Segmental wall thickening in the esophagus, possibly from esophagitis although technically nonspecific. Two focal areas of luminal widening and wall thinning in the esophagus are noted in the retrocardiac region, not changed from prior, an unusual configuration. 1.3 cm hypodense lesion in the right thyroid lobe. Not clinically significant; no follow-up imaging recommended (ref: J Am Coll Radiol. 2015 Feb;12(2): 143-50). Lungs/Pleura: Centrilobular emphysema. Increase in consolidative elements in the suprahilar and anterior right upper lobe regions for example as shown on image 50 of series 5, probably from radiation  pneumonitis, less likely from tumor growth. Posterior fiducial in the right upper lobe. 7 by 6 mm cavitary nodule in the right lower lobe on image 97 series 5, wall thickness 2 mm currently and about 1.5 mm previously, although the nodularity in this vicinity is still favored to probably be inflammatory. Upper Abdomen: Left adrenal adenoma as proven on noncontrast PET-CT images from 11/08/2020. No further imaging workup of this lesion is indicated. Musculoskeletal: Thoracic spondylosis. IMPRESSION: 1. Increase in consolidative elements in the suprahilar and anterior right upper lobe regions, probably from radiation pneumonitis. 2. 7 by 6 mm cavitary nodule in the right lower lobe, wall thickness 2 mm currently and about 1.5 mm previously, although the nodularity in this vicinity is still favored to probably be inflammatory. 3. Segmental wall thickening in the esophagus, possibly from esophagitis although technically nonspecific. Two focal areas of luminal widening and wall thinning in the esophagus are noted in the retrocardiac region, not changed from prior, an unusual configuration. 4. Left adrenal adenoma. 5. Centrilobular emphysema. 6. Aortic atherosclerosis. Aortic Atherosclerosis (ICD10-I70.0) and Emphysema (ICD10-J43.9). Electronically Signed   By: Gaylyn Rong M.D.   On: 06/24/2023 17:52    ASSESSMENT AND PLAN: This is a very pleasant 62 years old white female with limited stage (T1c, N2, M0) small cell lung cancer presented with right upper lobe and suprahilar mass in addition to mediastinal lymphadenopathy diagnosed in February 2022.  The patient underwent systemic chemotherapy with cisplatin 80 mg/M2 on day 1 and etoposide 100 mg/M2 on days 1, 2 and 3 every 3 weeks status post 4 cycles. This was concurrent with radiotherapy followed by prophylactic cranial irradiation. The patient is currently on observation and she is feeling fine. She had repeat CT scan of the chest performed recently.  I  personally and independently reviewed the scan and discussed the result with the patient today. Her scan showed increase in the consolidation elements in the suprahilar and anterior right upper lobe region likely from radiation pneumonitis. Assessment and Plan    Limited Stage Small Cell Lung Cancer Treated with cisplatin, etoposide, and radiation therapy completed in August 2022. Recent imaging shows no evidence of cancer recurrence, only radiation changes. Patient reports cough with white mucus but no hemoptysis. -Continue observation. -Next follow-up with imaging in 6 months.  Esophagitis Likely secondary to radiation therapy. No concerning symptoms reported. -Continue monitoring symptoms.  General Health Maintenance -Order routine blood work today.   The patient was advised to call immediately if she has any other concerning symptoms in the interval. The patient voices understanding of current disease status and treatment options and is in agreement with the current care plan.  All questions were answered. The patient knows to call the clinic with any problems, questions or concerns. We can certainly see the patient much sooner if necessary.  Disclaimer: This  note was dictated with voice recognition software. Similar sounding words can inadvertently be transcribed and may not be corrected upon review.

## 2023-07-17 ENCOUNTER — Encounter: Payer: Self-pay | Admitting: Internal Medicine

## 2023-07-17 NOTE — Telephone Encounter (Signed)
Telephone call  

## 2023-07-18 ENCOUNTER — Encounter: Payer: Self-pay | Admitting: Internal Medicine

## 2023-07-29 ENCOUNTER — Ambulatory Visit
Admission: RE | Admit: 2023-07-29 | Discharge: 2023-07-29 | Disposition: A | Discharge status: Home / Self Care | Payer: 59 | Source: Ambulatory Visit | Attending: Radiation Oncology | Admitting: Radiation Oncology

## 2023-07-29 DIAGNOSIS — Z85118 Personal history of other malignant neoplasm of bronchus and lung: Secondary | ICD-10-CM | POA: Diagnosis not present

## 2023-07-29 DIAGNOSIS — C7931 Secondary malignant neoplasm of brain: Secondary | ICD-10-CM | POA: Diagnosis not present

## 2023-07-29 MED ORDER — GADOPICLENOL 0.5 MMOL/ML IV SOLN
10.0000 mL | Freq: Once | INTRAVENOUS | Status: AC | PRN
  Administered 2023-07-29: 10 mL via INTRAVENOUS

## 2023-07-30 ENCOUNTER — Encounter: Payer: 59 | Attending: Registered Nurse | Admitting: Registered Nurse

## 2023-07-30 VITALS — BP 142/80 | HR 100 | Ht 69.0 in | Wt 188.0 lb

## 2023-07-30 DIAGNOSIS — M4802 Spinal stenosis, cervical region: Secondary | ICD-10-CM | POA: Diagnosis not present

## 2023-07-30 DIAGNOSIS — M542 Cervicalgia: Secondary | ICD-10-CM | POA: Insufficient documentation

## 2023-07-30 DIAGNOSIS — M546 Pain in thoracic spine: Secondary | ICD-10-CM | POA: Insufficient documentation

## 2023-07-30 DIAGNOSIS — Z5181 Encounter for therapeutic drug level monitoring: Secondary | ICD-10-CM | POA: Insufficient documentation

## 2023-07-30 DIAGNOSIS — M25511 Pain in right shoulder: Secondary | ICD-10-CM | POA: Diagnosis not present

## 2023-07-30 DIAGNOSIS — R000 Tachycardia, unspecified: Secondary | ICD-10-CM | POA: Diagnosis not present

## 2023-07-30 DIAGNOSIS — G8929 Other chronic pain: Secondary | ICD-10-CM | POA: Diagnosis not present

## 2023-07-30 DIAGNOSIS — M5412 Radiculopathy, cervical region: Secondary | ICD-10-CM | POA: Insufficient documentation

## 2023-07-30 DIAGNOSIS — Z79891 Long term (current) use of opiate analgesic: Secondary | ICD-10-CM | POA: Diagnosis not present

## 2023-07-30 DIAGNOSIS — G894 Chronic pain syndrome: Secondary | ICD-10-CM | POA: Diagnosis not present

## 2023-07-30 MED ORDER — ACETAMINOPHEN-CODEINE 300-60 MG PO TABS: 1.0000 | ORAL_TABLET | Freq: Every day | ORAL | 2 refills | Status: DC | PRN

## 2023-07-30 NOTE — Progress Notes (Unsigned)
Subjective:    Patient ID: Susan Cortez, female    DOB: [birth year], 62 y.o.   MRN: [id number]  [id number] Susan Cortez is a 62 y.o. female who returns for follow up appointment for chronic pain and medication refill. states *** pain is located in  ***. rates pain ***. current exercise regime is walking and performing stretching exercises.  Susan Cortez Morphine equivalent is *** MME.   UDS ordered today.    Pain Inventory Average Pain 7 Pain Right Now 5 My pain is sharp, burning, stabbing, and aching  In the last 24 hours, has pain interfered with the following? General activity 2 Relation with others 0 Enjoyment of life 1 What TIME of day is your pain at its worst? daytime Sleep (in general) Fair  Pain is worse with: walking, bending, standing, and some activites Pain improves with: rest, pacing activities, and medication Relief from Meds: 7  Family History  Problem Relation Age of Onset   Arthritis Mother    Heart disease Mother    Stroke Mother    Diabetes Father    Heart disease Father    Hyperlipidemia Father    Obesity Father    Cancer Paternal Grandmother    Alcohol abuse Brother    Early death Brother    Kidney disease Brother    Obesity Brother    Diabetes Sister    Obesity Sister    Heart disease Brother    Obesity Brother    Colon cancer Neg Hx    Colon polyps Neg Hx    Esophageal cancer Neg Hx    Rectal cancer Neg Hx    Stomach cancer Neg Hx    Social History   Socioeconomic History   Marital status: Divorced    Spouse name: Not on file   Number of children: Not on file   Years of education: Not on file   Highest education level: Not on file  Occupational History   Not on file  Tobacco Use   Smoking status: Every Day    Current packs/day: 1.00    Average packs/day: 1 pack/day for 48.0 years (48.0 ttl pk-yrs)    Types: Cigarettes   Smokeless tobacco: Never   Tobacco comments:    1 pack smoked per day [facility] 09/19/22  Vaping Use   Vaping  status: Former  Substance and Sexual Activity   Alcohol use: No   Drug use: No   Sexual activity: Not on file    Comment: Tubal Ligation  Other Topics Concern   Not on file  Social History Narrative   Not on file   Social Determinants of Health   Financial Resource Strain: High Risk (04/18/2023)   Overall Financial Resource Strain (CARDIA)    Difficulty of Paying Living Expenses: Very hard  Food Insecurity: Food Insecurity Present (04/18/2023)   Hunger Vital Sign    Worried About Running Out of Food in the Last Year: Often true    Ran Out of Food in the Last Year: Sometimes true  Transportation Needs: No Transportation Needs (04/18/2023)   PRAPARE - Administrator, Civil Service (Medical): No    Lack of Transportation (Non-Medical): No  Physical Activity: Inactive (04/18/2023)   Exercise Vital Sign    Days of Exercise per Week: 0 days    Minutes of Exercise per Session: 0 min  Stress: Stress Concern Present (04/18/2023)   [employer] of Occupational Health - Occupational Stress Questionnaire    Feeling of Stress :  To some extent  Social Connections: Unknown (04/18/2023)   Social Connection and Isolation Panel [NHANES]    Frequency of Communication with Friends and Family: More than three times a week    Frequency of Social Gatherings with Friends and Family: Once a week    Attends Religious Services: Not on file    Active Member of Clubs or Organizations: No    Attends Banker Meetings: Never    Marital Status: Divorced   Past Surgical History:  Procedure Laterality Date   CESAREAN SECTION     X 3   COLONOSCOPY  2013, 2016, 2019   hx colon polyps   DILATION AND CURETTAGE OF UTERUS     FUDUCIAL PLACEMENT Right 10/18/2020   Procedure: PLACEMENT OF FUDUCIAL;  Surgeon: Leslye Peer, MD;  Location: [facility];  Service: Thoracic;  Laterality: Right;   IR IMAGING GUIDED PORT INSERTION  11/29/2020   IR REMOVAL TUN ACCESS W/ PORT W/O FL MOD SED  05/08/2022    SHOULDER ARTHROSCOPY WITH OPEN ROTATOR CUFF REPAIR Right 05/2009   TONSILLECTOMY  1977   TUBAL LIGATION     VIDEO BRONCHOSCOPY WITH ENDOBRONCHIAL NAVIGATION N/A 10/18/2020   Procedure: VIDEO BRONCHOSCOPY WITH ENDOBRONCHIAL NAVIGATION;  Surgeon: Leslye Peer, MD;  Location: [facility];  Service: Thoracic;  Laterality: N/A;   VIDEO BRONCHOSCOPY WITH ENDOBRONCHIAL ULTRASOUND N/A 10/18/2020   Procedure: VIDEO BRONCHOSCOPY WITH ENDOBRONCHIAL ULTRASOUND;  Surgeon: Leslye Peer, MD;  Location: [facility];  Service: Thoracic;  Laterality: N/A;   WISDOM TOOTH EXTRACTION     Past Surgical History:  Procedure Laterality Date   CESAREAN SECTION     X 3   COLONOSCOPY  2013, 2016, 2019   hx colon polyps   DILATION AND CURETTAGE OF UTERUS     FUDUCIAL PLACEMENT Right 10/18/2020   Procedure: PLACEMENT OF FUDUCIAL;  Surgeon: Leslye Peer, MD;  [facility]: [facility];  Service: Thoracic;  Laterality: Right;   IR IMAGING GUIDED PORT INSERTION  11/29/2020   IR REMOVAL TUN ACCESS W/ PORT W/O FL MOD SED  05/08/2022   SHOULDER ARTHROSCOPY WITH OPEN ROTATOR CUFF REPAIR Right 05/2009   TONSILLECTOMY  1977   TUBAL LIGATION     VIDEO BRONCHOSCOPY WITH ENDOBRONCHIAL NAVIGATION N/A 10/18/2020   Procedure: VIDEO BRONCHOSCOPY WITH ENDOBRONCHIAL NAVIGATION;  Surgeon: Leslye Peer, MD;  [facility]: [facility];  Service: Thoracic;  Laterality: N/A;   VIDEO BRONCHOSCOPY WITH ENDOBRONCHIAL ULTRASOUND N/A 10/18/2020   Procedure: VIDEO BRONCHOSCOPY WITH ENDOBRONCHIAL ULTRASOUND;  Surgeon: Leslye Peer, MD;  Location: [facility];  Service: Thoracic;  Laterality: N/A;   WISDOM TOOTH EXTRACTION     Past Medical History:  Diagnosis Date   Allergy    Arthritis    Basal cell carcinoma 2018   Removed from Right neck per patient    COPD (chronic obstructive pulmonary disease) (HCC) 06/2019   Diverticulosis 2013   Dyslipidemia 03/01/2018   GERD (gastroesophageal reflux disease) 04/2021   HLD (hyperlipidemia)    Hx of adenomatous colonic  polyps 2013   OAB (overactive bladder)    Pre-diabetes    Small cell lung cancer (HCC) 11/09/2020   Stroke (HCC) 2022   BP (!) 142/80   Pulse (!) 111   Ht 5\' 9"  (1.753 m)   Wt 188 lb (85.3 kg)   LMP  (LMP Unknown)   SpO2 98%   BMI 27.76 kg/m   Opioid Risk Score:   Fall Risk Score:  `1  Depression screen PHQ  2/9     06/24/2023   10:18 AM 04/25/2023    1:10 PM 01/23/2023   10:46 AM 10/26/2022   10:35 AM 09/21/2022    3:19 PM 09/07/2022    9:26 AM 06/18/2022    8:28 AM  Depression screen PHQ 2/9  Decreased Interest 0 0 0 0 0 0 0  Down, Depressed, Hopeless 0 0 0 0 0 0 0  PHQ - 2 Score 0 0 0 0 0 0 0      Review of Systems  Musculoskeletal:  Positive for back pain and neck pain.       Right shoulder pain   All other systems reviewed and are negative.     Objective:   Physical Exam        Assessment & Plan:  1.Cervical spinal stenosis without evidence of radiculopathy/ Cervical Radiculitis . Chronic neck pain: Continue to Monitor.  Refilled Tylenol #4  300/60mg  daily, #30. 04/24/2023. We will continue the opioid monitoring program, this consists of regular clinic visits, examinations, urine drug screen, pill counts as well as use of [place] Controlled Substance Reporting system. A 12 month History has been reviewed on the [place] Controlled Substance Reporting System on 04/24/2023. 2. Right shoulder rotator cuff tear, status post repair with chronic pain and contracture : Continue with exercise regime as tolerated .04/24/2023. 3. Insomnia: Continue Melatonin. 04/24/2023 4. Chronic Bilateral Lower Back Pain without Sciatica: No complaints today. Continue HEP as tolerated. Continue to Monitor. 04/24/2023  5. Right Greater Trochanter Bursitis: No complaints Today. Continue to Alternate Ice and Heat Therapy. Continue to Monitor 04/24/2023 6. Chronic Thoracic Back Pain: Continue current medication regime. Continue HEP as tolerated. Continue to monitor.  04/24/2023 7. Tachycardia: Compliant with medication. PCP following. Continue to Monitor.    F/U in 3 months

## 2023-08-01 ENCOUNTER — Encounter: Payer: Self-pay | Admitting: Radiation Oncology

## 2023-08-01 ENCOUNTER — Encounter: Payer: Self-pay | Admitting: Registered Nurse

## 2023-08-02 LAB — TOXASSURE SELECT,+ANTIDEPR,UR

## 2023-08-05 ENCOUNTER — Inpatient Hospital Stay: Payer: 59

## 2023-08-05 ENCOUNTER — Encounter: Payer: Self-pay | Admitting: Radiation Oncology

## 2023-08-05 ENCOUNTER — Ambulatory Visit
Admission: RE | Admit: 2023-08-05 | Discharge: 2023-08-05 | Disposition: A | Discharge status: Home / Self Care | Payer: 59 | Source: Ambulatory Visit | Attending: Radiation Oncology | Admitting: Radiation Oncology

## 2023-08-05 VITALS — Ht 69.0 in | Wt 188.0 lb

## 2023-08-05 DIAGNOSIS — F17210 Nicotine dependence, cigarettes, uncomplicated: Secondary | ICD-10-CM | POA: Diagnosis not present

## 2023-08-05 DIAGNOSIS — C3411 Malignant neoplasm of upper lobe, right bronchus or lung: Secondary | ICD-10-CM | POA: Diagnosis not present

## 2023-08-05 NOTE — Progress Notes (Signed)
Radiation Oncology         [phone] ________________________________  Outpatient Follow Up - Conducted via telephone at patient request.  I spoke with the patient to conduct this consult visit via telephone. The patient was notified in advance and was offered an in person or telemedicine meeting to allow for face to face communication but instead preferred to proceed with a telephone visit. ________________________________  Name: Susan Cortez        MRN: [id number]  Date of Service: 08/05/2023 DOB: 24-Jan-1961  MV:HQIONGE, Gwenlyn Found, MD  Copland, Gwenlyn Found, MD     REFERRING PHYSICIAN: Copland, Gwenlyn Found, MD   DIAGNOSIS: The encounter diagnosis was Small cell carcinoma of upper lobe of right lung (HCC).   HISTORY OF PRESENT ILLNESS: Susan Cortez is a 62 y.o. female with a history of limited stage small cell carcinoma of the right upper lobe involving suprahilar nodule/adenopathy and right paractracheal adenopathy. A biopsy and cytology on 10/18/20 showed malignant cells in the right upper lobe brushings and fine-needle aspirate, atypical cells were seen in the 11 R node by fine-needle aspiration and malignant cells consistent with small cell carcinoma were seen in the level 4R and 10 R nodes as well.  She received chemoradiation which she completed on  01/04/21.   She was counseled on prophylactic cranial irradiation and prior to treatment had a negative MRI of the brain. She did have a lesion in the calvarium that was originally read as possibly metastatic, but after further discussion with neuroradiology, this was felt to be a benign appearing hemangioma and over time has been stable. She received whole brain radiation which she completed on 03/28/21. She completed Namenda in January 2023. She continues in surveillance with Dr. Arbutus Ped.  Her most recent MRI of the brain on 07/29/23 showed no evidence of intra cranial metastatic disease.  She has an unchanged left parietal skull lesion and  evidence of prior right cerebellar hemisphere infarcts that have been stable.  She is contacted by phone to review these results.  Of note her last CT scan of the chest with contrast on 06/18/2023 did show what was felt to be over all stability of her history of disease, she did have some inflammatory changes that were possibly pneumonitis in nature, and thickening and thinning and some areas of her esophagus.  She is contacted by phone today to review the MRI results.   REVIEW OF SYSTEMS: On review of systems the patient reports she is doing well and has seen ear nose and throat since her last visit.  She has had some audiology testing and it appears that she has sensorineural hearing loss.  She is going to follow this closely with ENT if it progresses.  She describes a terrible headache when she was first diagnosed with cancer after receiving her first cycle of chemo but states that she has not been having significant headaches though from time to time at night when she sleeps on 1 side she has a pain in her temples.  We discussed scenarios for having this evaluated if her symptoms persisted especially with ENT.  She also describes dysphagia to liquids at times.  She states that she is due to schedule her colonoscopy with Dr. Adela Lank given a history of polyps.  She denies any visual disturbances, changes in mood or behavior, or any difficulty with movement.   PREVIOUS RADIATION THERAPY:   03/15/2021 through 03/28/2021 Site Technique Total Dose (Gy) Dose per Fx (Gy) Completed Fx  Beam Energies  Brain:  Prophylactic Whole Brain Complex 25/25 2.5 10/10 6X   11/21/2020 through 01/04/2021 Site Technique Total Dose (Gy) Dose per Fx (Gy) Completed Fx Beam Energies  Lung, Right: Lung_Rt 3D 60/60 2 30/30 6X, 10X  Lung, Right: Lung_Rt_Bst 3D 6/6 2 3/3 6X, 10X    PAST MEDICAL HISTORY:  Past Medical History:  Diagnosis Date   Allergy    Arthritis    Basal cell carcinoma 2018   Removed from Right neck per  patient    COPD (chronic obstructive pulmonary disease) (HCC) 06/2019   Diverticulosis 2013   Dyslipidemia 03/01/2018   GERD (gastroesophageal reflux disease) 04/2021   HLD (hyperlipidemia)    Hx of adenomatous colonic polyps 2013   OAB (overactive bladder)    Pre-diabetes    Small cell lung cancer (HCC) 11/09/2020   Stroke (HCC) 2022       PAST SURGICAL HISTORY: Past Surgical History:  Procedure Laterality Date   CESAREAN SECTION     X 3   COLONOSCOPY  2013, 2016, 2019   hx colon polyps   DILATION AND CURETTAGE OF UTERUS     FUDUCIAL PLACEMENT Right 10/18/2020   Procedure: PLACEMENT OF FUDUCIAL;  Surgeon: Leslye Peer, MD;  Location: [facility] OR;  Service: Thoracic;  Laterality: Right;   IR IMAGING GUIDED PORT INSERTION  11/29/2020   IR REMOVAL TUN ACCESS W/ PORT W/O FL MOD SED  05/08/2022   SHOULDER ARTHROSCOPY WITH OPEN ROTATOR CUFF REPAIR Right 05/2009   TONSILLECTOMY  1977   TUBAL LIGATION     VIDEO BRONCHOSCOPY WITH ENDOBRONCHIAL NAVIGATION N/A 10/18/2020   Procedure: VIDEO BRONCHOSCOPY WITH ENDOBRONCHIAL NAVIGATION;  Surgeon: Leslye Peer, MD;  Location: [facility] OR;  Service: Thoracic;  Laterality: N/A;   VIDEO BRONCHOSCOPY WITH ENDOBRONCHIAL ULTRASOUND N/A 10/18/2020   Procedure: VIDEO BRONCHOSCOPY WITH ENDOBRONCHIAL ULTRASOUND;  Surgeon: Leslye Peer, MD;  Location: [facility] OR;  Service: Thoracic;  Laterality: N/A;   WISDOM TOOTH EXTRACTION       FAMILY HISTORY:  Family History  Problem Relation Age of Onset   Arthritis Mother    Heart disease Mother    Stroke Mother    Diabetes Father    Heart disease Father    Hyperlipidemia Father    Obesity Father    Cancer Paternal Grandmother    Alcohol abuse Brother    Early death Brother    Kidney disease Brother    Obesity Brother    Diabetes Sister    Obesity Sister    Heart disease Brother    Obesity Brother    Colon cancer Neg Hx    Colon polyps Neg Hx    Esophageal cancer Neg Hx    Rectal cancer Neg Hx    Stomach  cancer Neg Hx      SOCIAL HISTORY:  reports that she has been smoking cigarettes. She has a 48 pack-year smoking history. She has never used smokeless tobacco. She reports that she does not drink alcohol and does not use drugs. The patient is divorced and lives in [place]. She is originally from [place] and enjoys traveling home to [place] periodically to see friends and family.     ALLERGIES: Lyrica [pregabalin], Butrans [buprenorphine], Chantix [varenicline], Diclofenac, Doxycycline, Meloxicam, Tolmetin, and Nsaids   MEDICATIONS:  Current Outpatient Medications  Medication Sig Dispense Refill   acetaminophen-codeine (TYLENOL #4) 300-60 MG tablet Take 1 tablet by mouth daily as needed for moderate pain (pain score 4-6). 30 tablet 2  CALCIUM PO Take 650 mg by mouth daily.     Cholecalciferol (VITAMIN D3) 125 MCG (5000 UT) CAPS Take 5,000 Units by mouth daily.     Cyanocobalamin (B-12) 100 MCG TABS      ipratropium (ATROVENT) 0.06 % nasal spray Place 2 sprays into both nostrils 4 (four) times daily. Use as needed for nasal drainage 15 mL 12   LORazepam (ATIVAN) 0.5 MG tablet TAKE 1 TABLET 30 MINUTES PRIOR TO MRI OR RADIATION 5 tablet 0   lovastatin (MEVACOR) 20 MG tablet Take 1 tablet (20 mg total) by mouth at bedtime. 90 tablet 1   metoprolol succinate (TOPROL-XL) 25 MG 24 hr tablet TAKE 1/2 TABLET BY MOUTH DAILY 45 tablet 3   Multiple Vitamins-Minerals (MULTIVITAMIN ADULT, MINERALS,) TABS      MYRBETRIQ 50 MG TB24 tablet Take 50 mg by mouth daily.     No current facility-administered medications for this encounter.        PHYSICAL EXAM:  Unable to assess  due to encounter type.    ECOG = 1  0 - Asymptomatic (Fully active, able to carry on all predisease activities without restriction)  1 - Symptomatic but completely ambulatory (Restricted in physically strenuous activity but ambulatory and able to carry out work of a light or sedentary nature. For example, light  housework, office work)  2 - Symptomatic, <50% in bed during the day (Ambulatory and capable of all self care but unable to carry out any work activities. Up and about more than 50% of waking hours)  3 - Symptomatic, >50% in bed, but not bedbound (Capable of only limited self-care, confined to bed or chair 50% or more of waking hours)  4 - Bedbound (Completely disabled. Cannot carry on any self-care. Totally confined to bed or chair)  5 - Death   Santiago Glad MM, Creech RH, Tormey DC, et al. [phone]). "Toxicity and response criteria of the [facility]". Am. Evlyn Clines. Oncol. 5 (6): 649-55    LABORATORY DATA:  Lab Results  Component Value Date   WBC 7.1 06/18/2023   HGB 14.7 06/18/2023   HCT 42.9 06/18/2023   MCV 94.5 06/18/2023   PLT 309 06/18/2023   Lab Results  Component Value Date   NA 138 06/18/2023   K 3.9 06/18/2023   CL 103 06/18/2023   CO2 30 06/18/2023   Lab Results  Component Value Date   ALT 16 06/18/2023   AST 18 06/18/2023   ALKPHOS 51 06/18/2023   BILITOT 0.4 06/18/2023      RADIOGRAPHY: MR Brain W Wo Contrast  Result Date: 08/01/2023 CLINICAL DATA:  Brain metastases, assess treatment response. History of small cell lung cancer. EXAM: MRI HEAD WITHOUT AND WITH CONTRAST TECHNIQUE: Multiplanar, multiecho pulse sequences of the brain and surrounding structures were obtained without and with intravenous contrast. CONTRAST:  10 mL Vueway COMPARISON:  Head MRI 01/24/2023 FINDINGS: Brain: There is no evidence of an acute infarct, intracranial hemorrhage, mass, midline shift, or extra-axial fluid collection. The ventricles are normal in size. Patchy T2 hyperintensities in the cerebral white matter are stable to slightly increased and are nonspecific but likely reflect at least in part post treatment changes. Small chronic right cerebellar infarcts are unchanged. No abnormal enhancement is identified. Vascular: Major intracranial vascular flow voids are  preserved. Skull and upper cervical spine: Unchanged 3 cm left parietal skull lesion. Sinuses/Orbits: Unremarkable orbits. Paranasal sinuses and mastoid air cells are clear. Other: None. IMPRESSION: 1. No evidence of intracranial  metastases. 2. Unchanged left parietal skull lesion. Electronically Signed   By: Sebastian Ache M.D.   On: 08/01/2023 12:41        IMPRESSION/PLAN: 1. Limited Stage Small Cell Carcinoma of the RUL.  The patient continues to do well radiographically and has been stable without new disease in the brain.  I recommend that we continue at 24-month intervals with repeat MRI scans.  She will continue to follow in surveillance with Dr. Arbutus Ped. 2. Left Parietal Calvarial Hemangioma. This finding continues to be stable and is followed expectantly and felt to be benign. 3. Sensorineural hearing loss and temporal headaches.  I encouraged the patient to follow-up with her ear nose and throat provider if her symptoms of hearing loss were to progress or her symptoms and her temple area were to progress.  She is in agreement with this plan. 4.   History of colon polyps and dysphagia to liquids.  Given her most recent CT scan showing thickening in the esophagus and thinning in some of the levels of the wall of the esophagus, in addition to her symptoms I let her know I would send a message to Dr. Adela Lank.  She is also in favor of scheduling her next colonoscopy given a history of polyps.   This encounter was conducted via telephone.  The patient has provided two factor identification and has given verbal consent for this type of encounter and has been advised to only accept a meeting of this type in a secure network environment. The time spent during this encounter was 35 minutes including preparation, discussion, and coordination of the patient's care. The attendants for this meeting include  Ronny Bacon  and Janeann Forehand.  During the encounter,  Ronny Bacon was  located at [facility] in the radiation oncology department Kaleigh MEERAB SPIELMANN was located at home.        Osker Mason, [facility]   **Disclaimer: This note was dictated with voice recognition software. Similar sounding words can inadvertently be transcribed and this note may contain transcription errors which may not have been corrected upon publication of note.**

## 2023-08-05 NOTE — Progress Notes (Addendum)
Telephone nursing appointment for results review of MRI-Brain. I verified patient's identity x2 and began nursing interview.   Patient reports doing well. Patient denies any other related issues at this time.   Meaningful use complete.   Patient aware of their 1pm-08/05/2023 telephone appointment w/ Laurence Aly PA-C. I left my extension [phone] in case patient needs anything. Patient verbalized understanding. This concludes the nursing interview.   Patient contact [phone]     Ruel Favors, LPN

## 2023-08-06 ENCOUNTER — Other Ambulatory Visit: Payer: Self-pay | Admitting: Radiation Therapy

## 2023-08-06 DIAGNOSIS — C7931 Secondary malignant neoplasm of brain: Secondary | ICD-10-CM

## 2023-08-07 ENCOUNTER — Telehealth: Payer: Self-pay | Admitting: *Deleted

## 2023-08-07 NOTE — Telephone Encounter (Signed)
===  View-only below this line=== ----- Message ----- From: Benancio Deeds, MD Sent: 08/06/2023   5:31 PM EST To: Richardson Chiquito, RN  Dottie can you help book this patient a follow up with me or APP? Thanks ----- Message ----- From: Ronny Bacon, PA-C Sent: 08/05/2023   1:27 PM EST To: Benancio Deeds, MD  Hi there, just wanted to pass along she's having some dysphagia at times to water, she had some thickening and some areas of her esophagus as well as some thinning of the wall as well and given her prior radiation fields to the chest which covered the mediastinum I am curious what you think about risks of stenosis and if she needs to be scoped.  She said that she is due for her colonoscopy next year anyway.   Thanks in advance, Jill Side

## 2023-08-07 NOTE — Telephone Encounter (Signed)
Inbound call from patient returning phone call. Patient confirmed appointment.

## 2023-08-07 NOTE — Telephone Encounter (Signed)
Left message for patient to call back. She has been scheduled to see Willette Cluster, NP on 08/14/23 at 130 pm for further evaluation.

## 2023-08-11 ENCOUNTER — Other Ambulatory Visit: Payer: Self-pay | Admitting: Family Medicine

## 2023-08-11 DIAGNOSIS — R32 Unspecified urinary incontinence: Secondary | ICD-10-CM

## 2023-08-14 ENCOUNTER — Ambulatory Visit: Payer: 59 | Admitting: Nurse Practitioner

## 2023-08-14 ENCOUNTER — Encounter: Payer: Self-pay | Admitting: Nurse Practitioner

## 2023-08-14 VITALS — BP 120/78 | HR 116 | Ht 68.0 in | Wt 192.0 lb

## 2023-08-14 DIAGNOSIS — K5900 Constipation, unspecified: Secondary | ICD-10-CM

## 2023-08-14 DIAGNOSIS — R933 Abnormal findings on diagnostic imaging of other parts of digestive tract: Secondary | ICD-10-CM | POA: Diagnosis not present

## 2023-08-14 DIAGNOSIS — R1310 Dysphagia, unspecified: Secondary | ICD-10-CM | POA: Diagnosis not present

## 2023-08-14 DIAGNOSIS — Z860100 Personal history of colon polyps, unspecified: Secondary | ICD-10-CM

## 2023-08-14 MED ORDER — NA SULFATE-K SULFATE-MG SULF 17.5-3.13-1.6 GM/177ML PO SOLN: ORAL | 0 refills | Status: DC

## 2023-08-14 MED ORDER — LINACLOTIDE 145 MCG PO CAPS: 145.0000 ug | ORAL_CAPSULE | Freq: Every day | ORAL | 3 refills | Status: DC

## 2023-08-14 NOTE — Patient Instructions (Signed)
You have been scheduled for an endoscopy and colonoscopy. Please follow the written instructions given to you at your visit today.  Please pick up your prep supplies at the pharmacy within the next 1-3 days.  If you use inhalers (even only as needed), please bring them with you on the day of your procedure.  DO NOT TAKE 7 DAYS PRIOR TO TEST- Trulicity (dulaglutide) Ozempic, Wegovy (semaglutide) Mounjaro (tirzepatide) Bydureon Bcise (exanatide extended release)  DO NOT TAKE 1 DAY PRIOR TO YOUR TEST Rybelsus (semaglutide) Adlyxin (lixisenatide) Victoza (liraglutide) Byetta (exanatide) ___________________________________________________________________________  We have sent the following medications to your pharmacy for you to pick up at your convenience: Suprep  Willette Cluster NP recommends that you complete a bowel purge (to clean out your bowels). Please do the following: Purchase a bottle of Miralax over the counter as well as a box of 5 mg dulcolax tablets. Take 4 dulcolax tablets. Wait 1 hour. You will then drink 6-8 capfuls of Miralax mixed in an adequate amount of water/juice/gatorade (you may choose which of these liquids to drink) over the next 2-3 hours. You should expect results within 1 to 6 hours after completing the bowel purge.   If your blood pressure at your visit was 140/90 or greater, please contact your primary care physician to follow up on this.  _______________________________________________________  If you are age 39 or older, your body mass index should be between 23-30. Your Body mass index is 29.19 kg/m. If this is out of the aforementioned range listed, please consider follow up with your Primary Care Provider.  If you are age 67 or younger, your body mass index should be between 19-25. Your Body mass index is 29.19 kg/m. If this is out of the aformentioned range listed, please consider follow up with your Primary Care Provider.    ________________________________________________________  The [facility] providers would like to encourage you to use [facility] to communicate with providers for non-urgent requests or questions.  Due to long hold times on the telephone, sending your provider a message by [facility] may be a faster and more efficient way to get a response.  Please allow 48 business hours for a response.  Please remember that this is for non-urgent requests.  _______________________________________________________  Due to recent changes in healthcare laws, you may see the results of your imaging and laboratory studies on MyChart before your provider has had a chance to review them.  We understand that in some cases there may be results that are confusing or concerning to you. Not all laboratory results come back in the same time frame and the provider may be waiting for multiple results in order to interpret others.  Please give [place] 48 hours in order for your provider to thoroughly review all the results before contacting the office for clarification of your results.   Thank you for entrusting me with your care and choosing [facility].  Willette Cluster NP

## 2023-08-14 NOTE — Progress Notes (Signed)
ASSESSMENT    Brief Narrative:  62 y.o.  female known to Dr. Adela Lank with a past medical history not limited to emphysema, RUL small cell lung cancer status post chemotherapy/radiation in 2022, ongoing tobacco abuse,  chronic back pain requiring opioids, colon polyps.   Abnormal esophagus on CT chest -  Segmental wall thickening in the esophagus and two focal areas of luminal widening and wall thinning in the esophagus in the retrocardiac region, not changed from prior.  Consider radiation induced esophageal mucosal injury.   Chronic dysphagia, started in 2022 following radiation for lung cancer. Not clear cut esophageal in nature despite esophageal findings on CT chest.  Very rarely has problems with food hanging up in her esophagus. Mainly just regurgitates water after swallowing.  Interestingly, it is only plain water that she  regurgitates . Flavored water doesn't cause her any problems.   RUL limited stage small cell lung cancer status post chemotherapy and radiation in 2022.  Also had prophylactic cranial irradiation August 2022  History of colon polyps.  Several small tubular adenomas and 1 sessile serrated polyp removed in February 2022.  A 3 to 5-year follow-up colonoscopy was recommended  Chronic constipation.  May go days to weeks without a bowel movement.  MiraLAX not very helpful.  Has taken magnesium citrate at times.  See [facility] for any additional medical & surgical history   PLAN   --Schedule for EGD. The risks and benefits of EGD with possible biopsies were discussed with the patient who agrees to proceed.  --Will proceed with 3-year polyp surveillance colonoscopy at time of EGD. Schedule for a colonoscopy. The risks and benefits of colonoscopy with possible polypectomy / biopsies were discussed and the patient agrees to proceed.  -- 2-day bowel prep for colonoscopy -- MiraLAX purge today -- Trial of Linzess 145 mcg daily on an empty stomach  HPI   Chief  complaint : problems swallowing water. Severe constipation. Time for colonoscopy   Brief GI History:  Susan Cortez was last seen in 2022 at time of colonoscopy . Our office was contacted by Kirby Funk, PA-C about patient having dysphagia and some esophageal abnormalities on his CT chest   Interval History:  Susan Cortez began having problems swallowing in late 2022.  She had undergone radiation for lung cancer and also had prophylactic brain irradiation.  She describes a lot of mucus in her throat especially in the morning.  She endorses postnasal drip which has been refractory to antihistamines and steroid nasal sprays.  Her swallowing problems mainly consist of difficulty swallowing plain water.  She will regurgitate water within seconds of swallowing it.  Interestingly, she can drink flavored water without any problem.  She can drink any other liquids without any problem.  Seldom if she ever had problems with solid food hanging up in her esophagus.  She has no heartburn or other GERD symptoms.  CT chest shows some esophageal abnormalities. Keenan continues to smoke.  She finds it difficult to go more than a couple weeks at the time without smoking  Susan Cortez also has chronic constipation.  The constipation is quite severe.  She may go days or even weeks without a bowel movement.  Her stools are hard.  She has tried daily MiraLAX for at least 2 months at a time without much improvement.  She has used magnesium citrate at times.   GI History / Pertinent GI Studies   **All endoscopic studies may not be included here  February 2022 polyp surveillance colonoscopy -- Sigmoid diverticulosis.  A single small angiodysplastic lesion in the ascending colon.  Two flat polyps ranging from 3 to 4 mm in size removed from the ascending colon.  A 4 mm sessile polyp was removed from the descending colon.  A 4 to 5 mm polyp, sessile, was removed from the rectum.  Exam otherwise normal -- Based on pathology patient was  advised to have a follow-up colonoscopy in 3 to 5 years.  Diagnosis 1. Surgical [P], colon, rectum and descending, polyp (2) - TUBULAR ADENOMA WITHOUT HIGH GRADE DYSPLASIA (X1). - HYPERPLASTIC POLYP (X1). 2. Surgical [P], colon, ascending, polyp (2) - TUBULAR ADENOMA WITHOUT HIGH GRADE DYSPLASIA (X1). - SESSILE SERRATED POLYP WITHOUT CYTOLOGIC DYSPLASIA      Latest Ref Rng & Units 06/18/2023   11:14 AM 03/19/2023    9:44 AM 09/17/2022    7:50 AM  Hepatic Function  Total Protein 6.5 - 8.1 g/dL 7.1  6.9  6.9   Albumin 3.5 - 5.0 g/dL 4.3  4.4  4.2   AST 15 - 41 U/L 18  23  22    ALT 0 - 44 U/L 16  22  22    Alk Phosphatase 38 - 126 U/L 51  50  50   Total Bilirubin 0.3 - 1.2 mg/dL 0.4  0.4  0.5        Latest Ref Rng & Units 06/18/2023   11:14 AM 03/19/2023    9:44 AM 09/17/2022    7:50 AM  CBC  WBC 4.0 - 10.5 K/uL 7.1  7.4  7.9   Hemoglobin 12.0 - 15.0 g/dL 16.1  09.6  04.5   Hematocrit 36.0 - 46.0 % 42.9  43.3  43.4   Platelets 150 - 400 K/uL 309  330  176      Past Medical History:  Diagnosis Date   Allergy    Arthritis    Basal cell carcinoma 2018   Removed from Right neck per patient    COPD (chronic obstructive pulmonary disease) (HCC) 06/2019   Diverticulosis 2013   Dyslipidemia 03/01/2018   GERD (gastroesophageal reflux disease) 04/2021   HLD (hyperlipidemia)    Hx of adenomatous colonic polyps 2013   OAB (overactive bladder)    Pre-diabetes    Small cell lung cancer (HCC) 11/09/2020   Stroke (HCC) 2022    Past Surgical History:  Procedure Laterality Date   CESAREAN SECTION     X 3   COLONOSCOPY  2013, 2016, 2019   hx colon polyps   DILATION AND CURETTAGE OF UTERUS     FUDUCIAL PLACEMENT Right 10/18/2020   Procedure: PLACEMENT OF FUDUCIAL;  Surgeon: Leslye Peer, MD;  Location: [facility] OR;  Service: Thoracic;  Laterality: Right;   IR IMAGING GUIDED PORT INSERTION  11/29/2020   IR REMOVAL TUN ACCESS W/ PORT W/O FL MOD SED  05/08/2022   SHOULDER ARTHROSCOPY WITH  OPEN ROTATOR CUFF REPAIR Right 05/2009   TONSILLECTOMY  1977   TUBAL LIGATION     VIDEO BRONCHOSCOPY WITH ENDOBRONCHIAL NAVIGATION N/A 10/18/2020   Procedure: VIDEO BRONCHOSCOPY WITH ENDOBRONCHIAL NAVIGATION;  Surgeon: Leslye Peer, MD;  Location: [facility] OR;  Service: Thoracic;  Laterality: N/A;   VIDEO BRONCHOSCOPY WITH ENDOBRONCHIAL ULTRASOUND N/A 10/18/2020   Procedure: VIDEO BRONCHOSCOPY WITH ENDOBRONCHIAL ULTRASOUND;  Surgeon: Leslye Peer, MD;  Location: [facility] OR;  Service: Thoracic;  Laterality: N/A;   WISDOM TOOTH EXTRACTION      Family History  Problem Relation Age  of Onset   Arthritis Mother    Heart disease Mother    Stroke Mother    Diabetes Father    Heart disease Father    Hyperlipidemia Father    Obesity Father    Diabetes Sister    Obesity Sister    Pancreatitis Sister    Liver disease Sister        abscess   Alcohol abuse Brother    Early death Brother    Kidney disease Brother    Obesity Brother    Heart disease Brother    Obesity Brother    Heart attack Brother    Cancer Paternal Grandmother    Colon cancer Neg Hx    Colon polyps Neg Hx    Esophageal cancer Neg Hx    Rectal cancer Neg Hx    Stomach cancer Neg Hx     Current Medications, Allergies, Family History and Social History were reviewed in [employer] electronic medical record.     Current Outpatient Medications  Medication Sig Dispense Refill   acetaminophen-codeine (TYLENOL #4) 300-60 MG tablet Take 1 tablet by mouth daily as needed for moderate pain (pain score 4-6). 30 tablet 2   CALCIUM PO Take 650 mg by mouth daily.     Cholecalciferol (VITAMIN D3) 125 MCG (5000 UT) CAPS Take 5,000 Units by mouth daily.     Cyanocobalamin (B-12) 100 MCG TABS      fluocinonide (LIDEX) 0.05 % external solution Apply 1 Application topically as needed.     ipratropium (ATROVENT) 0.06 % nasal spray Place 2 sprays into both nostrils 4 (four) times daily. Use as needed for nasal drainage 15 mL 12    LORazepam (ATIVAN) 0.5 MG tablet TAKE 1 TABLET 30 MINUTES PRIOR TO MRI OR RADIATION 5 tablet 0   lovastatin (MEVACOR) 20 MG tablet Take 1 tablet (20 mg total) by mouth at bedtime. 90 tablet 1   metoprolol succinate (TOPROL-XL) 25 MG 24 hr tablet TAKE 1/2 TABLET BY MOUTH DAILY 45 tablet 3   Multiple Vitamins-Minerals (MULTIVITAMIN ADULT, MINERALS,) TABS      MYRBETRIQ 50 MG TB24 tablet TAKE 1 TABLET BY MOUTH EVERY DAY 90 tablet 3   No current facility-administered medications for this visit.    Review of Systems: No chest pain. No shortness of breath. No urinary complaints.    Physical Exam  Filed Weights   08/14/23 1318  Weight: 192 lb (87.1 kg)   Wt Readings from Last 3 Encounters:  08/14/23 192 lb (87.1 kg)  08/05/23 188 lb (85.3 kg)  07/30/23 188 lb (85.3 kg)    BP 120/78 (BP Location: Left Arm, Patient Position: Sitting, Cuff Size: Normal)   Pulse (!) 116   Ht 5\' 8"  (1.727 m)   Wt 192 lb (87.1 kg)   LMP  (LMP Unknown)   BMI 29.19 kg/m  Constitutional:  Pleasant, generally well appearing female in no acute distress. Psychiatric: Normal mood and affect. Behavior is normal. EENT: Pupils normal.  Conjunctivae are normal. No scleral icterus. Neck supple.  Cardiovascular: Normal rate, regular rhythm.  Pulmonary/chest: Effort normal and breath sounds normal. No wheezing, rales or rhonchi. Abdominal: Soft, nondistended, nontender. Bowel sounds active throughout. There are no masses palpable. No hepatomegaly. Neurological: Alert and oriented to person place and time.    Willette Cluster, NP  08/14/2023, 1:34 PM

## 2023-08-14 NOTE — Progress Notes (Signed)
Agree with assessment and plan as outlined.  

## 2023-08-29 ENCOUNTER — Other Ambulatory Visit: Payer: Self-pay | Admitting: Radiation Therapy

## 2023-09-08 ENCOUNTER — Other Ambulatory Visit: Payer: Self-pay | Admitting: Family Medicine

## 2023-09-08 DIAGNOSIS — E785 Hyperlipidemia, unspecified: Secondary | ICD-10-CM

## 2023-09-09 ENCOUNTER — Encounter: Payer: Self-pay | Admitting: Radiation Oncology

## 2023-09-18 ENCOUNTER — Other Ambulatory Visit: Payer: 59

## 2023-09-23 ENCOUNTER — Encounter: Payer: Self-pay | Admitting: Gastroenterology

## 2023-09-25 ENCOUNTER — Ambulatory Visit: Payer: 59 | Admitting: Internal Medicine

## 2023-09-25 ENCOUNTER — Encounter: Payer: Self-pay | Admitting: Emergency Medicine

## 2023-09-25 ENCOUNTER — Ambulatory Visit: Payer: 59 | Admitting: Emergency Medicine

## 2023-09-25 VITALS — BP 108/70 | HR 103 | Temp 97.7°F | Ht 68.0 in | Wt 190.2 lb

## 2023-09-25 DIAGNOSIS — Z720 Tobacco use: Secondary | ICD-10-CM

## 2023-09-25 DIAGNOSIS — J449 Chronic obstructive pulmonary disease, unspecified: Secondary | ICD-10-CM | POA: Diagnosis not present

## 2023-09-25 DIAGNOSIS — C3411 Malignant neoplasm of upper lobe, right bronchus or lung: Secondary | ICD-10-CM | POA: Diagnosis not present

## 2023-09-25 NOTE — Assessment & Plan Note (Signed)
Mild obstruction.  She has cough, bronchitic symptoms.  Does not believe that she has benefited from bronchodilators at least to date.  We may decide to repeat pulmonary function testing or retry inhaler therapy going forward.  Recommended the flu shot but she wants to defer.

## 2023-09-25 NOTE — Patient Instructions (Signed)
Please continue to follow with Dr. Arbutus Ped as planned and get your repeat imaging as planned. We will hold off on starting any inhaler medications at this time although you may benefit from retrying these at some point in the future Most important thing to do right now is to decrease your cigarette smoking.  Work hard to cut down.  We will then try to create strategies to set a quit date and stop altogether. Follow Dr. Delton Coombes in 1 year, sooner if you have problems.

## 2023-09-25 NOTE — Assessment & Plan Note (Signed)
No evidence of recurrence on CT chest or on MRI brain.  She continues to follow with oncology and radiation oncology.  Talk to her today about the need to stop smoking.

## 2023-09-25 NOTE — Assessment & Plan Note (Signed)
Discussed the need for cessation with her today.  She does understand.  She quit for 2 weeks before in the past.  For now I asked her to work on try to cut down, first to three-quarter pack daily.  If she can accomplish this then will try to get down to half a pack.  After that we can talk about strategies to set a quit date

## 2023-09-25 NOTE — Progress Notes (Signed)
   Subjective:    Patient ID: Susan Cortez, female    DOB: Dec 20, 1960, 63 y.o.   MRN: [id number]  HPI  ROV 09/25/2023 --follow-up visit for 63 year old woman with a history of active tobacco use and mild COPD, small cell lung cancer for which she underwent chemoradiation.  Also with chronic cough in the setting of rhinitis and GERD.  She last saw Dr. Arbutus Ped in October, is currently on observation.  Her most recent CT scan of the chest was 06/18/2023 as below.  Her breathing is improved, not currently on any scheduled BD therapy. She describes some dysphagia of pills, reflux of water. She has congestion and drainage to her throat - does not believe nasal spray help her. She is smoking 1 pk a day. She has daily cough, worse at night. Clears phlegm in the am. Doesn't believe she has benefited from inhalers.   CT scan of the chest 06/18/2023 reviewed by me, showed some increase suprahilar and anterior right upper lobe consolidation likely from radiation change.  Decrease size of a 7 mm cavitary nodule in the right lower lobe.  Segmental wall thickening the esophagus possibly from active or prior esophagitis  MRI brain 07/29/2023 showed no evidence of intracranial metastatic disease posttreatment.   Review of Systems As per HPI     Objective:   Physical Exam Vitals:   09/25/23 1053  BP: 108/70  Pulse: (!) 103  Temp: 97.7 F (36.5 C)  TempSrc: Temporal  SpO2: 100%  Weight: 190 lb 3.2 oz (86.3 kg)  Height: 5\' 8"  (1.727 m)   Gen: Pleasant, well-nourished, in no distress,  normal affect  ENT: No lesions,  mouth clear,  oropharynx clear, no postnasal drip  Neck: No JVD, no stridor  Lungs: No use of accessory muscles, no crackles or wheezing on normal respiration, no wheeze on forced expiration  Cardiovascular: RRR, heart sounds normal, no murmur or gallops, no peripheral edema  Musculoskeletal: No deformities, no cyanosis or clubbing  Neuro: alert, awake, non focal  Skin: Warm, no  lesions or rash      Assessment & Plan:  Small cell carcinoma of right lung (HCC) No evidence of recurrence on CT chest or on MRI brain.  She continues to follow with oncology and radiation oncology.  Talk to her today about the need to stop smoking.  COPD, mild (HCC) Mild obstruction.  She has cough, bronchitic symptoms.  Does not believe that she has benefited from bronchodilators at least to date.  We may decide to repeat pulmonary function testing or retry inhaler therapy going forward.  Recommended the flu shot but she wants to defer.  Tobacco use Discussed the need for cessation with her today.  She does understand.  She quit for 2 weeks before in the past.  For now I asked her to work on try to cut down, first to three-quarter pack daily.  If she can accomplish this then will try to get down to half a pack.  After that we can talk about strategies to set a quit date    Levy Pupa, MD, PhD 09/25/2023, 11:15 AM [facility] [phone] or if no answer [phone]

## 2023-09-30 ENCOUNTER — Telehealth: Payer: Self-pay | Admitting: Gastroenterology

## 2023-09-30 NOTE — Telephone Encounter (Signed)
Patient called stating her daughter is at the hospital and will not be able to make it to the 10/02/2023 procdure. Patient did reschedule for 11/07/2023 at 2:30PM.

## 2023-09-30 NOTE — Telephone Encounter (Signed)
Okay, sorry to hear this, thanks for letting me know 

## 2023-10-01 ENCOUNTER — Other Ambulatory Visit: Payer: Self-pay | Admitting: Medical Genetics

## 2023-10-02 ENCOUNTER — Encounter: Payer: 59 | Admitting: Gastroenterology

## 2023-10-10 ENCOUNTER — Other Ambulatory Visit (HOSPITAL_COMMUNITY)
Admission: RE | Admit: 2023-10-10 | Discharge: 2023-10-10 | Disposition: A | Discharge status: Home / Self Care | Payer: Self-pay | Source: Ambulatory Visit | Attending: Oncology | Admitting: Oncology

## 2023-10-10 ENCOUNTER — Ambulatory Visit: Payer: 59 | Admitting: Family Medicine

## 2023-10-10 VITALS — BP 123/71 | HR 96 | Temp 98.0°F | Ht 68.0 in | Wt 191.0 lb

## 2023-10-10 DIAGNOSIS — J0140 Acute pansinusitis, unspecified: Secondary | ICD-10-CM | POA: Diagnosis not present

## 2023-10-10 DIAGNOSIS — R051 Acute cough: Secondary | ICD-10-CM

## 2023-10-10 MED ORDER — AMOXICILLIN-POT CLAVULANATE 875-125 MG PO TABS
1.0000 | ORAL_TABLET | Freq: Two times a day (BID) | ORAL | 0 refills | Status: DC
Start: 2023-10-10 — End: 2024-02-03

## 2023-10-10 MED ORDER — GUAIFENESIN ER 600 MG PO TB12
1200.0000 mg | ORAL_TABLET | Freq: Two times a day (BID) | ORAL | 0 refills | Status: AC
Start: 2023-10-10 — End: ?

## 2023-10-10 MED ORDER — BENZONATATE 200 MG PO CAPS: 200.0000 mg | ORAL_CAPSULE | Freq: Two times a day (BID) | ORAL | 0 refills | Status: DC | PRN

## 2023-10-10 NOTE — Patient Instructions (Signed)
Continue supportive measures including rest, hydration, humidifier use, steam showers, warm compresses to sinuses, warm liquids with lemon and honey, and over-the-counter cough, cold, and analgesics as needed.    Over the counter medications that may be helpful for symptoms:  Guaifenesin 1200 mg extended release tabs twice daily, with plenty of water For cough and congestion Brand name: Mucinex   Pseudoephedrine 30 mg, one or two tabs every 4 to 6 hours For sinus congestion Brand name: Sudafed You must get this from the pharmacy counter.  Oxymetazoline nasal spray each morning, one spray in each nostril, for NO MORE THAN 3 days  For nasal and sinus congestion Brand name: Afrin Saline nasal spray or Saline Nasal Irrigation (Netti Pot, etc) 3-5 times a day For nasal and sinus congestion Brand names: Ocean or AYR Fluticasone nasal spray OR Mometasone nasal spray OR Triamcinolone Acetonide nasal spray - follow directions on the packaging For nasal and sinus congestion Brand name: Flonase, Nasonex, Nasacort Warm salt water gargles  For sore throat Every few hours as needed Alternate ibuprofen 400-600 mg and acetaminophen 1000 mg every 6 hours For fever, body aches, headache Brand names: Motrin or Advil and Tylenol Dextromethorphan 12-hour cough version 30 mg every 12 hours  For cough Brand name: Delsym Stop all other cold medications for now (Nyquil, Dayquil, Tylenol Cold, Theraflu, etc) and other non-prescription cough/cold preparations. Many of these have the same ingredients listed above and could cause an overdose of medication.   Herbal treatments that have been shown to be helpful in some patients include: Vitamin C 1000 mg per day Zinc 100 mg per day Quercetin 25-500 mg twice a day Melatonin 5-10mg  at bedtime Honey Green Tea  General Instructions Allow your body to rest Drink PLENTY of fluids Typically, we are the most contagious 1-2 days before symptoms start through the  first 2-3 days of most severe symptoms. Per CDC guidelines, you can return to school/work when symptoms have started to improve and you have been fever-free for 24 hours. However, recommend you continue extra precautions for the following 5 days (frequent hand hygiene, masking, covering coughs/sneezes, minimize exposure to immunocompromised individuals, etc).  If you develop severe shortness of breath, uncontrolled fevers, coughing up blood, confusion, chest pain, or signs of dehydration (such as significantly decreased urine amounts or dizziness with standing) please go to the nearest ER.

## 2023-10-10 NOTE — Progress Notes (Signed)
Acute Office Visit  Subjective:     Patient ID: Susan Cortez, female    DOB: June 15, 1961, 63 y.o.   MRN: [id number]  CC: URI   HPI Patient is in today for URI.  Discussed the use of AI scribe software for clinical note transcription with the patient, who gave verbal consent to proceed.  History of Present Illness   Susan Cortez is a 63 year old female who presents with symptoms of exhaustion, headache, sore throat, and ear pain.  She has been experiencing exhaustion for the past week and a half, describing it as feeling 'totally exhausted'. This began after her daughter was diagnosed with flu A and pneumonia, indicating possible transmission of illness. She lives separately from her daughter but was in close contact when her daughter was sick.  She developed a headache yesterday, characterized by significant pressure behind the eyes and occasional visual disturbances. She has been taking Tylenol occasionally for relief.  She also has a sore throat and ear pain that began yesterday, with drainage at the back of her throat. No fever, with the highest temperature recorded being 99.72F.  She describes a cough that is different from her usual radiation cough, with mucus production but no hemoptysis. No fever, nausea, vomiting, diarrhea, chest pain, or wheezing. Breathing is at baseline except for the new cough.  Family history includes heart issues.           ROS All review of systems negative except what is listed in the HPI      Objective:    BP 123/71   Pulse 96   Temp 98 F (36.7 C) (Oral)   Ht 5\' 8"  (1.727 m)   Wt 191 lb (86.6 kg)   LMP  (LMP Unknown)   SpO2 99%   BMI 29.04 kg/m    Physical Exam Vitals reviewed.  Constitutional:      Appearance: Normal appearance.  HENT:     Head: Normocephalic and atraumatic.     Right Ear: Tympanic membrane normal.     Left Ear: Tympanic membrane normal.     Nose: Congestion and rhinorrhea present.      Mouth/Throat:     Comments: Postnasal drainage Cardiovascular:     Rate and Rhythm: Normal rate and regular rhythm.  Pulmonary:     Effort: Pulmonary effort is normal.     Breath sounds: Normal breath sounds. No wheezing or rhonchi.  Musculoskeletal:     Cervical back: Normal range of motion.  Skin:    General: Skin is warm and dry.  Neurological:     Mental Status: She is alert and oriented to person, place, and time.  Psychiatric:        Mood and Affect: Mood normal.        Behavior: Behavior normal.        Thought Content: Thought content normal.        Judgment: Judgment normal.     No results found for any visits on 10/10/23.      Assessment & Plan:   Problem List Items Addressed This Visit       Active Problems   Cough   Relevant Medications   benzonatate (TESSALON) 200 MG capsule   guaiFENesin (MUCINEX) 600 MG 12 hr tablet   amoxicillin-clavulanate (AUGMENTIN) 875-125 MG tablet   Other Visit Diagnoses       Acute non-recurrent pansinusitis    -  Primary   Relevant Medications   benzonatate (TESSALON) 200 MG  capsule   guaiFENesin (MUCINEX) 600 MG 12 hr tablet   amoxicillin-clavulanate (AUGMENTIN) 875-125 MG tablet       Symptoms of headache, sore throat, ear pain, and cough for approximately 1.5 weeks. No fever, nausea, vomiting, diarrhea, chest pain, or wheezing. Stable vital signs. Possible exposure to flu A and pneumonia from daughter. -Prescribe Augmentin with instructions to take with food and water to prevent stomach upset. -Advise to take a probiotic midday to maintain gut flora. -Prescribe Tessalon for cough and Mucinex to help break up mucus. -Advise to drink plenty of fluids to help thin out mucus. -If no improvement in 3-4 days or if symptoms worsen, patient to notify doctor. If no improvement, consider chest x-ray. -Advise to seek immediate medical attention if symptoms become severe.          Meds ordered this encounter  Medications    benzonatate (TESSALON) 200 MG capsule    Sig: Take 1 capsule (200 mg total) by mouth 2 (two) times daily as needed for cough.    Dispense:  20 capsule    Refill:  0    Supervising Provider:   Danise Edge A [[id number]]   guaiFENesin (MUCINEX) 600 MG 12 hr tablet    Sig: Take 2 tablets (1,200 mg total) by mouth 2 (two) times daily.    Dispense:  30 tablet    Refill:  0    Supervising Provider:   Danise Edge A [[id number]]   amoxicillin-clavulanate (AUGMENTIN) 875-125 MG tablet    Sig: Take 1 tablet by mouth 2 (two) times daily.    Dispense:  20 tablet    Refill:  0    Supervising Provider:   Danise Edge A [[id number]]    Return if symptoms worsen or fail to improve.  Clayborne Dana, NP

## 2023-10-28 ENCOUNTER — Encounter: Payer: 59 | Attending: Registered Nurse | Admitting: Registered Nurse

## 2023-10-28 VITALS — BP 105/71 | HR 102 | Ht 68.0 in | Wt 190.0 lb

## 2023-10-28 DIAGNOSIS — Z5181 Encounter for therapeutic drug level monitoring: Secondary | ICD-10-CM | POA: Diagnosis not present

## 2023-10-28 DIAGNOSIS — M4802 Spinal stenosis, cervical region: Secondary | ICD-10-CM | POA: Insufficient documentation

## 2023-10-28 DIAGNOSIS — M5412 Radiculopathy, cervical region: Secondary | ICD-10-CM | POA: Diagnosis not present

## 2023-10-28 DIAGNOSIS — Z79891 Long term (current) use of opiate analgesic: Secondary | ICD-10-CM | POA: Diagnosis not present

## 2023-10-28 DIAGNOSIS — M542 Cervicalgia: Secondary | ICD-10-CM | POA: Insufficient documentation

## 2023-10-28 DIAGNOSIS — G894 Chronic pain syndrome: Secondary | ICD-10-CM | POA: Diagnosis not present

## 2023-10-28 MED ORDER — ACETAMINOPHEN-CODEINE 300-60 MG PO TABS: 1.0000 | ORAL_TABLET | Freq: Every day | ORAL | 2 refills | Status: DC | PRN

## 2023-10-28 NOTE — Progress Notes (Signed)
 Subjective:    Patient ID: Susan Cortez, female    DOB: 22-Jun-1961, 63 y.o.   MRN: [id number]  HPI: Susan Cortez is a 63 y.o. female who returns for follow up appointment for chronic pain and medication refill. She states her pain is located in her neck radiating into her right shoulder and upper back. She rates her pain 5. Her current exercise regime is walking and performing stretching exercises.  [name] equivalent is 9.00 MME.   Last IUDS was Performed on 07/30/2023, it was consistent.     Pain Inventory Average Pain 7 Pain Right Now 5 My pain is constant, sharp, burning, stabbing, and aching  In the last 24 hours, has pain interfered with the following? General activity 1 Relation with others 1 Enjoyment of life 1 What TIME of day is your pain at its worst? daytime Sleep (in general) Fair  Pain is worse with: walking, bending, standing, and some activites Pain improves with: rest and medication Relief from Meds: 7  Family History  Problem Relation Age of Onset   Arthritis Mother    Heart disease Mother    Stroke Mother    Diabetes Father    Heart disease Father    Hyperlipidemia Father    Obesity Father    Diabetes Sister    Obesity Sister    Pancreatitis Sister    Liver disease Sister        abscess   Alcohol abuse Brother    Early death Brother    Kidney disease Brother    Obesity Brother    Heart disease Brother    Obesity Brother    Heart attack Brother    Cancer Paternal Grandmother    Colon cancer Neg Hx    Colon polyps Neg Hx    Esophageal cancer Neg Hx    Rectal cancer Neg Hx    Stomach cancer Neg Hx    Social History   Socioeconomic History   Marital status: Divorced    Spouse name: Not on file   Number of children: Not on file   Years of education: Not on file   Highest education level: Associate degree: academic program  Occupational History   Not on file  Tobacco Use   Smoking status: Every Day    Current  packs/day: 1.00    Average packs/day: 1 pack/day for 48.0 years (48.0 ttl pk-yrs)    Types: Cigarettes   Smokeless tobacco: Never   Tobacco comments:    1 pack smoked per day ARJ 09/19/22  Vaping Use   Vaping status: Former  Substance and Sexual Activity   Alcohol use: No   Drug use: No   Sexual activity: Not on file    Comment: Tubal Ligation  Other Topics Concern   Not on file  Social History Narrative   Not on file   Social Drivers of Health   Financial Resource Strain: High Risk (10/10/2023)   Overall Financial Resource Strain (CARDIA)    Difficulty of Paying Living Expenses: Hard  Food Insecurity: No Food Insecurity (10/10/2023)   Hunger Vital Sign    Worried About Running Out of Food in the Last Year: Never true    Ran Out of Food in the Last Year: Never true  Transportation Needs: No Transportation Needs (10/10/2023)   PRAPARE - Administrator, Civil Service (Medical): No    Lack of Transportation (Non-Medical): No  Physical Activity: Inactive (10/10/2023)   Exercise Vital Sign  Days of Exercise per Week: 0 days    Minutes of Exercise per Session: 0 min  Stress: No Stress Concern Present (10/10/2023)   [employer] of Occupational Health - Occupational Stress Questionnaire    Feeling of Stress : Only a little  Social Connections: Moderately Isolated (10/10/2023)   Social Connection and Isolation Panel [NHANES]    Frequency of Communication with Friends and Family: More than three times a week    Frequency of Social Gatherings with Friends and Family: Once a week    Attends Religious Services: 1 to 4 times per year    Active Member of [employer] or Organizations: No    Attends Banker Meetings: Never    Marital Status: Divorced   Past Surgical History:  Procedure Laterality Date   CESAREAN SECTION     X 3   COLONOSCOPY  2013, 2016, 2019   hx colon polyps   DILATION AND CURETTAGE OF UTERUS     FUDUCIAL PLACEMENT Right 10/18/2020    Procedure: PLACEMENT OF FUDUCIAL;  Surgeon: Leslye Peer, MD;  Location: [facility];  Service: Thoracic;  Laterality: Right;   IR IMAGING GUIDED PORT INSERTION  11/29/2020   IR REMOVAL TUN ACCESS W/ PORT W/O FL MOD SED  05/08/2022   SHOULDER ARTHROSCOPY WITH OPEN ROTATOR CUFF REPAIR Right 05/2009   TONSILLECTOMY  1977   TUBAL LIGATION     VIDEO BRONCHOSCOPY WITH ENDOBRONCHIAL NAVIGATION N/A 10/18/2020   Procedure: VIDEO BRONCHOSCOPY WITH ENDOBRONCHIAL NAVIGATION;  Surgeon: Leslye Peer, MD;  Location: [facility];  Service: Thoracic;  Laterality: N/A;   VIDEO BRONCHOSCOPY WITH ENDOBRONCHIAL ULTRASOUND N/A 10/18/2020   Procedure: VIDEO BRONCHOSCOPY WITH ENDOBRONCHIAL ULTRASOUND;  Surgeon: Leslye Peer, MD;  Location: [facility];  Service: Thoracic;  Laterality: N/A;   WISDOM TOOTH EXTRACTION     Past Surgical History:  Procedure Laterality Date   CESAREAN SECTION     X 3   COLONOSCOPY  2013, 2016, 2019   hx colon polyps   DILATION AND CURETTAGE OF UTERUS     FUDUCIAL PLACEMENT Right 10/18/2020   Procedure: PLACEMENT OF FUDUCIAL;  Surgeon: Leslye Peer, MD;  [facility];  Service: Thoracic;  Laterality: Right;   IR IMAGING GUIDED PORT INSERTION  11/29/2020   IR REMOVAL TUN ACCESS W/ PORT W/O FL MOD SED  05/08/2022   SHOULDER ARTHROSCOPY WITH OPEN ROTATOR CUFF REPAIR Right 05/2009   TONSILLECTOMY  1977   TUBAL LIGATION     VIDEO BRONCHOSCOPY WITH ENDOBRONCHIAL NAVIGATION N/A 10/18/2020   Procedure: VIDEO BRONCHOSCOPY WITH ENDOBRONCHIAL NAVIGATION;  Surgeon: Leslye Peer, MD;  [facility];  Service: Thoracic;  Laterality: N/A;   VIDEO BRONCHOSCOPY WITH ENDOBRONCHIAL ULTRASOUND N/A 10/18/2020   Procedure: VIDEO BRONCHOSCOPY WITH ENDOBRONCHIAL ULTRASOUND;  Surgeon: Leslye Peer, MD;  [facility];  Service: Thoracic;  Laterality: N/A;   WISDOM TOOTH EXTRACTION     Past Medical History:  Diagnosis Date   Allergy    Arthritis    Basal cell carcinoma 2018   Removed from Right neck per  patient    COPD (chronic obstructive pulmonary disease) (HCC) 06/2019   Diverticulosis 2013   Dyslipidemia 03/01/2018   GERD (gastroesophageal reflux disease) 04/2021   HLD (hyperlipidemia)    Hx of adenomatous colonic polyps 2013   OAB (overactive bladder)    Pre-diabetes    Small cell lung cancer (HCC) 11/09/2020   Stroke (HCC) 2022   BP 105/71   Pulse (!) 102  Ht 5\' 8"  (1.727 m)   Wt 190 lb (86.2 kg)   LMP  (LMP Unknown)   SpO2 97%   BMI 28.89 kg/m   Opioid Risk Score:   Fall Risk Score:  `1  Depression screen [facility] 2/9     06/24/2023   10:18 AM 04/25/2023    1:10 PM 01/23/2023   10:46 AM 10/26/2022   10:35 AM 09/21/2022    3:19 PM 09/07/2022    9:26 AM 06/18/2022    8:28 AM  Depression screen PHQ 2/9  Decreased Interest 0 0 0 0 0 0 0  Down, Depressed, Hopeless 0 0 0 0 0 0 0  PHQ - 2 Score 0 0 0 0 0 0 0     Review of Systems  Musculoskeletal:  Positive for neck pain.       Right shoulder pain  All other systems reviewed and are negative.      Objective:   Physical Exam Vitals and nursing note reviewed.  Constitutional:      Appearance: Normal appearance.  Cardiovascular:     Rate and Rhythm: Normal rate and regular rhythm.     Pulses: Normal pulses.     Heart sounds: Normal heart sounds.  Pulmonary:     Effort: Pulmonary effort is normal.     Breath sounds: Normal breath sounds.  Musculoskeletal:     Comments: Normal Muscle Bulk and Muscle Testing Reveals:  Upper Extremities: Right: Decreased ROM 90 Degrees  and Muscle Strength 5/5 Left Upper Extremity: Full ROM and Muscle Strength 5/5 Thoracic Paraspinal Tenderness: T-2-T-6 Lower Extremities: Full ROM and Muscle Strength 5/5 Arises from chair with ease Narrow Based  Gait     Skin:    General: Skin is warm and dry.  Neurological:     Mental Status: She is alert and oriented to person, place, and time.  Psychiatric:        Mood and Affect: Mood normal.        Behavior: Behavior normal.          Assessment & Plan:  1.Cervical spinal stenosis without evidence of radiculopathy/ Cervical Radiculitis . Chronic neck pain: Continue to Monitor.  Refilled Tylenol #4  300/60mg  daily, #30. 10/28/2023 We will continue the opioid monitoring program, this consists of regular clinic visits, examinations, urine drug screen, pill counts as well as use of [place] Controlled Substance Reporting system. A 12 month History has been reviewed on the [place] Controlled Substance Reporting System on 10/28/2023. 2. Right shoulder rotator cuff tear, status post repair with chronic pain and contracture : Continue with exercise regime as tolerated .10/28/2023. 3. Insomnia: Continue Melatonin. 10/28/2023 4. Chronic Bilateral Lower Back Pain without Sciatica: No complaints today. Continue HEP as tolerated. Continue to Monitor. 10/28/2023  5. Right Greater Trochanter Bursitis: No complaints Today. Continue to Alternate Ice and Heat Therapy. Continue to Monitor 10/28/2023 6. Chronic Thoracic Back Pain: Continue current medication regime. Continue HEP as tolerated. Continue to monitor. 10/28/2023  F/U in 3 months

## 2023-10-29 LAB — GENECONNECT MOLECULAR SCREEN: Genetic Analysis Overall Interpretation: NEGATIVE

## 2023-11-01 ENCOUNTER — Encounter: Payer: Self-pay | Admitting: Registered Nurse

## 2023-11-05 ENCOUNTER — Encounter: Payer: Self-pay | Admitting: Certified Registered Nurse Anesthetist

## 2023-11-07 ENCOUNTER — Encounter: Payer: Self-pay | Admitting: Gastroenterology

## 2023-11-07 ENCOUNTER — Ambulatory Visit (AMBULATORY_SURGERY_CENTER): Payer: 59 | Admitting: Gastroenterology

## 2023-11-07 VITALS — BP 141/90 | HR 89 | Temp 97.9°F | Resp 15 | Ht 68.0 in | Wt 190.0 lb

## 2023-11-07 DIAGNOSIS — R1310 Dysphagia, unspecified: Secondary | ICD-10-CM

## 2023-11-07 DIAGNOSIS — K635 Polyp of colon: Secondary | ICD-10-CM | POA: Diagnosis not present

## 2023-11-07 DIAGNOSIS — D132 Benign neoplasm of duodenum: Secondary | ICD-10-CM

## 2023-11-07 DIAGNOSIS — K5730 Diverticulosis of large intestine without perforation or abscess without bleeding: Secondary | ICD-10-CM | POA: Diagnosis not present

## 2023-11-07 DIAGNOSIS — Z860100 Personal history of colon polyps, unspecified: Secondary | ICD-10-CM

## 2023-11-07 DIAGNOSIS — K2100 Gastro-esophageal reflux disease with esophagitis, without bleeding: Secondary | ICD-10-CM | POA: Diagnosis not present

## 2023-11-07 DIAGNOSIS — Z1211 Encounter for screening for malignant neoplasm of colon: Secondary | ICD-10-CM | POA: Diagnosis not present

## 2023-11-07 DIAGNOSIS — K648 Other hemorrhoids: Secondary | ICD-10-CM

## 2023-11-07 DIAGNOSIS — D123 Benign neoplasm of transverse colon: Secondary | ICD-10-CM

## 2023-11-07 DIAGNOSIS — J449 Chronic obstructive pulmonary disease, unspecified: Secondary | ICD-10-CM | POA: Diagnosis not present

## 2023-11-07 DIAGNOSIS — K317 Polyp of stomach and duodenum: Secondary | ICD-10-CM

## 2023-11-07 DIAGNOSIS — D122 Benign neoplasm of ascending colon: Secondary | ICD-10-CM | POA: Diagnosis not present

## 2023-11-07 DIAGNOSIS — K3189 Other diseases of stomach and duodenum: Secondary | ICD-10-CM | POA: Diagnosis not present

## 2023-11-07 DIAGNOSIS — K2289 Other specified disease of esophagus: Secondary | ICD-10-CM

## 2023-11-07 DIAGNOSIS — K2950 Unspecified chronic gastritis without bleeding: Secondary | ICD-10-CM | POA: Diagnosis not present

## 2023-11-07 DIAGNOSIS — K2282 Esophagogastric junction polyp: Secondary | ICD-10-CM | POA: Diagnosis not present

## 2023-11-07 DIAGNOSIS — K2980 Duodenitis without bleeding: Secondary | ICD-10-CM | POA: Diagnosis not present

## 2023-11-07 DIAGNOSIS — Z860101 Personal history of adenomatous and serrated colon polyps: Secondary | ICD-10-CM | POA: Diagnosis not present

## 2023-11-07 DIAGNOSIS — R7303 Prediabetes: Secondary | ICD-10-CM | POA: Diagnosis not present

## 2023-11-07 MED ORDER — SODIUM CHLORIDE 0.9 % IV SOLN: 500.0000 mL | INTRAVENOUS | Status: DC

## 2023-11-07 MED ORDER — OMEPRAZOLE 20 MG PO CPDR: 20.0000 mg | DELAYED_RELEASE_CAPSULE | Freq: Every day | ORAL | 3 refills | Status: DC

## 2023-11-07 NOTE — Progress Notes (Signed)
 Pt's states no medical or surgical changes since previsit or office visit.

## 2023-11-07 NOTE — Progress Notes (Signed)
 1622 HR > 100 with esmolol 25 mg given IV, MD updated, vss

## 2023-11-07 NOTE — Progress Notes (Signed)
1548 HR > 100 with esmolol 25 mg given IV, MD updated, vss

## 2023-11-07 NOTE — Progress Notes (Signed)
1524 Robinul 0.1 mg IV given due large amount of secretions upon assessment.  MD made aware, vss  

## 2023-11-07 NOTE — Patient Instructions (Addendum)
 Resume previous diet and medications.  Handout provided on polyps, hemorrhoids, and diverticulosis.  Follow up colonoscopy based on biopsy results.  Omeprazole 20mg  daily ordered.    YOU HAD AN ENDOSCOPIC PROCEDURE TODAY AT THE [facility]:   Refer to the procedure report that was given to you for any specific questions about what was found during the examination.  If the procedure report does not answer your questions, please call your gastroenterologist to clarify.  If you requested that your care partner not be given the details of your procedure findings, then the procedure report has been included in a sealed envelope for you to review at your convenience later.  YOU SHOULD EXPECT: Some feelings of bloating in the abdomen. Passage of more gas than usual.  Walking can help get rid of the air that was put into your GI tract during the procedure and reduce the bloating. If you had a lower endoscopy (such as a colonoscopy or flexible sigmoidoscopy) you may notice spotting of blood in your stool or on the toilet paper. If you underwent a bowel prep for your procedure, you may not have a normal bowel movement for a few days.  Please Note:  You might notice some irritation and congestion in your nose or some drainage.  This is from the oxygen used during your procedure.  There is no need for concern and it should clear up in a day or so.  SYMPTOMS TO REPORT IMMEDIATELY:  Following lower endoscopy (colonoscopy or flexible sigmoidoscopy):  Excessive amounts of blood in the stool  Significant tenderness or worsening of abdominal pains  Swelling of the abdomen that is new, acute  Fever of 100F or higher  Following upper endoscopy (EGD)  Vomiting of blood or coffee ground material  New chest pain or pain under the shoulder blades  Painful or persistently difficult swallowing  New shortness of breath  Fever of 100F or higher  Black, tarry-looking stools  For urgent or emergent issues,  a gastroenterologist can be reached at any hour by calling [phone]. Do not use MyChart messaging for urgent concerns.    DIET:  We do recommend a small meal at first, but then you may proceed to your regular diet.  Drink plenty of fluids but you should avoid alcoholic beverages for 24 hours.  ACTIVITY:  You should plan to take it easy for the rest of today and you should NOT DRIVE or use heavy machinery until tomorrow (because of the sedation medicines used during the test).    FOLLOW UP: Our staff will call the number listed on your records the next business day following your procedure.  We will call around 7:15- 8:00 am to check on you and address any questions or concerns that you may have regarding the information given to you following your procedure. If we do not reach you, we will leave a message.     If any biopsies were taken you will be contacted by phone or by letter within the next 1-3 weeks.  Please call us at [phone] if you have not heard about the biopsies in 3 weeks.    SIGNATURES/CONFIDENTIALITY: You and/or your care partner have signed paperwork which will be entered into your electronic medical record.  These signatures attest to the fact that that the information above on your After Visit Summary has been reviewed and is understood.  Full responsibility of the confidentiality of this discharge information lies with you and/or your care-partner.

## 2023-11-07 NOTE — Progress Notes (Signed)
1600 HR > 100 with esmolol 25 mg given IV, MD updated, vss  ?

## 2023-11-07 NOTE — Progress Notes (Signed)
 [facility] History and Physical   Primary Care Physician:  Copland, Gwenlyn Found, MD   Reason for Procedure:   Dysphagia, history of colon polyps  Plan:    EGD and colonoscopy     HPI: Susan Cortez is a 63 y.o. female  here for EGD and colonoscopy. History of dysphagia. Lung cancer history s/p XRT - CT scan has suggested thickening of her esophagus. EGD to further evaluate.She has no problem swallowing her food, only transitioning water into her esophagus. No GERD she endorses currently.   History of colon polyps - TA and SSPs removed 09/2020. Here for surveillance colonoscopy. No problems with her bowel.    Otherwise feels well without any cardiopulmonary symptoms.   I have discussed risks / benefits of anesthesia and endoscopic procedure with Susan Cortez and they wish to proceed with the exams as outlined today.    Past Medical History:  Diagnosis Date   Allergy    Arthritis    Basal cell carcinoma 2018   Removed from Right neck per patient    COPD (chronic obstructive pulmonary disease) (HCC) 06/2019   Diverticulosis 2013   Dyslipidemia 03/01/2018   GERD (gastroesophageal reflux disease) 04/2021   HLD (hyperlipidemia)    Hx of adenomatous colonic polyps 2013   OAB (overactive bladder)    Pre-diabetes    Small cell lung cancer (HCC) 11/09/2020   Stroke (HCC) 2022    Past Surgical History:  Procedure Laterality Date   CESAREAN SECTION     X 3   COLONOSCOPY  2013, 2016, 2019   hx colon polyps   DILATION AND CURETTAGE OF UTERUS     FUDUCIAL PLACEMENT Right 10/18/2020   Procedure: PLACEMENT OF FUDUCIAL;  Surgeon: Leslye Peer, MD;  Location: [facility];  Service: Thoracic;  Laterality: Right;   IR IMAGING GUIDED PORT INSERTION  11/29/2020   IR REMOVAL TUN ACCESS W/ PORT W/O FL MOD SED  05/08/2022   SHOULDER ARTHROSCOPY WITH OPEN ROTATOR CUFF REPAIR Right 05/2009   TONSILLECTOMY  1977   TUBAL LIGATION     VIDEO BRONCHOSCOPY WITH ENDOBRONCHIAL NAVIGATION  N/A 10/18/2020   Procedure: VIDEO BRONCHOSCOPY WITH ENDOBRONCHIAL NAVIGATION;  Surgeon: Leslye Peer, MD;  [facility]: [facility];  Service: Thoracic;  Laterality: N/A;   VIDEO BRONCHOSCOPY WITH ENDOBRONCHIAL ULTRASOUND N/A 10/18/2020   Procedure: VIDEO BRONCHOSCOPY WITH ENDOBRONCHIAL ULTRASOUND;  Surgeon: Leslye Peer, MD;  Location: [facility];  Service: Thoracic;  Laterality: N/A;   WISDOM TOOTH EXTRACTION      Prior to Admission medications   Medication Sig Start Date End Date Taking? Authorizing Provider  acetaminophen-codeine (TYLENOL #4) 300-60 MG tablet Take 1 tablet by mouth daily as needed for moderate pain (pain score 4-6). 10/28/23  Yes Jones Bales, NP  CALCIUM PO Take 650 mg by mouth daily.   Yes [provider]  Cholecalciferol (VITAMIN D3) 125 MCG (5000 UT) CAPS Take 5,000 Units by mouth daily.   Yes [provider]  Cyanocobalamin (B-12) 100 MCG TABS  01/30/21  Yes [provider]  fluocinonide (LIDEX) 0.05 % external solution Apply 1 Application topically as needed. 07/22/23  Yes [provider]  guaiFENesin (MUCINEX) 600 MG 12 hr tablet Take 2 tablets (1,200 mg total) by mouth 2 (two) times daily. 10/10/23  Yes Clayborne Dana, NP  linaclotide [facility]) 145 MCG CAPS capsule Take 1 capsule (145 mcg total) by mouth daily before breakfast. Take 30-60 minutes before breakfast 08/14/23 08/08/24 Yes Willette Cluster  M, NP  lovastatin (MEVACOR) 20 MG tablet TAKE 1 TABLET BY MOUTH EVERYDAY AT BEDTIME 09/09/23  Yes Copland, Gwenlyn Found, MD  metoprolol succinate (TOPROL-XL) 25 MG 24 hr tablet TAKE 1/2 TABLET BY MOUTH DAILY 04/30/23  Yes Copland, Gwenlyn Found, MD  Multiple Vitamins-Minerals (MULTIVITAMIN ADULT, MINERALS,) TABS  01/30/21  Yes [provider]  MYRBETRIQ 50 MG TB24 tablet TAKE 1 TABLET BY MOUTH EVERY DAY 08/12/23  Yes Copland, Gwenlyn Found, MD  amoxicillin-clavulanate (AUGMENTIN) 875-125 MG tablet Take 1 tablet by mouth 2 (two) times daily. 10/10/23    Clayborne Dana, NP  benzonatate (TESSALON) 200 MG capsule Take 1 capsule (200 mg total) by mouth 2 (two) times daily as needed for cough. 10/10/23   Clayborne Dana, NP  LORazepam (ATIVAN) 0.5 MG tablet TAKE 1 TABLET 30 MINUTES PRIOR TO MRI OR RADIATION Patient not taking: Reported on 11/07/2023 06/19/23   Ronny Bacon, PA-C    Current Outpatient Medications  Medication Sig Dispense Refill   acetaminophen-codeine (TYLENOL #4) 300-60 MG tablet Take 1 tablet by mouth daily as needed for moderate pain (pain score 4-6). 30 tablet 2   CALCIUM PO Take 650 mg by mouth daily.     Cholecalciferol (VITAMIN D3) 125 MCG (5000 UT) CAPS Take 5,000 Units by mouth daily.     Cyanocobalamin (B-12) 100 MCG TABS      fluocinonide (LIDEX) 0.05 % external solution Apply 1 Application topically as needed.     guaiFENesin (MUCINEX) 600 MG 12 hr tablet Take 2 tablets (1,200 mg total) by mouth 2 (two) times daily. 30 tablet 0   linaclotide (LINZESS) 145 MCG CAPS capsule Take 1 capsule (145 mcg total) by mouth daily before breakfast. Take 30-60 minutes before breakfast 90 capsule 3   lovastatin (MEVACOR) 20 MG tablet TAKE 1 TABLET BY MOUTH EVERYDAY AT BEDTIME 90 tablet 1   metoprolol succinate (TOPROL-XL) 25 MG 24 hr tablet TAKE 1/2 TABLET BY MOUTH DAILY 45 tablet 3   Multiple Vitamins-Minerals (MULTIVITAMIN ADULT, MINERALS,) TABS      MYRBETRIQ 50 MG TB24 tablet TAKE 1 TABLET BY MOUTH EVERY DAY 90 tablet 3   amoxicillin-clavulanate (AUGMENTIN) 875-125 MG tablet Take 1 tablet by mouth 2 (two) times daily. 20 tablet 0   benzonatate (TESSALON) 200 MG capsule Take 1 capsule (200 mg total) by mouth 2 (two) times daily as needed for cough. 20 capsule 0   LORazepam (ATIVAN) 0.5 MG tablet TAKE 1 TABLET 30 MINUTES PRIOR TO MRI OR RADIATION (Patient not taking: Reported on 11/07/2023) 5 tablet 0   Current Facility-Administered Medications  Medication Dose Route Frequency Provider Last Rate Last Admin   0.9 %  sodium  chloride infusion  500 mL Intravenous Continuous Kevina Piloto, Willaim Rayas, MD        Allergies as of 11/07/2023 - Review Complete 11/07/2023  Allergen Reaction Noted   Lyrica [pregabalin] Swelling 03/26/2014   Butrans [buprenorphine] Itching 01/02/2016   Chantix [varenicline] Other (See Comments) 09/05/2016   Diclofenac Swelling 05/05/2015   Doxycycline Nausea Only 07/19/2021   Meloxicam Swelling 05/05/2015   Tolmetin Other (See Comments) 03/26/2014   Nsaids Other (See Comments) 03/26/2014    Family History  Problem Relation Age of Onset   Arthritis Mother    Heart disease Mother    Stroke Mother    Diabetes Father    Heart disease Father    Hyperlipidemia Father    Obesity Father    Diabetes Sister    Obesity Sister  Pancreatitis Sister    Liver disease Sister        abscess   Alcohol abuse Brother    Early death Brother    Kidney disease Brother    Obesity Brother    Heart disease Brother    Obesity Brother    Heart attack Brother    Cancer Paternal Grandmother    Colon cancer Neg Hx    Colon polyps Neg Hx    Esophageal cancer Neg Hx    Rectal cancer Neg Hx    Stomach cancer Neg Hx     Social History   Socioeconomic History   Marital status: Divorced    Spouse name: Not on file   Number of children: Not on file   Years of education: Not on file   Highest education level: Associate degree: academic program  Occupational History   Not on file  Tobacco Use   Smoking status: Every Day    Current packs/day: 1.00    Average packs/day: 1 pack/day for 48.0 years (48.0 ttl pk-yrs)    Types: Cigarettes   Smokeless tobacco: Never   Tobacco comments:    1 pack smoked per day ARJ 09/19/22  Vaping Use   Vaping status: Former  Substance and Sexual Activity   Alcohol use: No   Drug use: No   Sexual activity: Not on file    Comment: Tubal Ligation  Other Topics Concern   Not on file  Social History Narrative   Not on file   Social Drivers of Health    Financial Resource Strain: High Risk (10/10/2023)   Overall Financial Resource Strain (CARDIA)    Difficulty of Paying Living Expenses: Hard  Food Insecurity: No Food Insecurity (10/10/2023)   Hunger Vital Sign    Worried About Running Out of Food in the Last Year: Never true    Ran Out of Food in the Last Year: Never true  Transportation Needs: No Transportation Needs (10/10/2023)   PRAPARE - Administrator, Civil Service (Medical): No    Lack of Transportation (Non-Medical): No  Physical Activity: Inactive (10/10/2023)   Exercise Vital Sign    Days of Exercise per Week: 0 days    Minutes of Exercise per Session: 0 min  Stress: No Stress Concern Present (10/10/2023)   [employer] of Occupational Health - Occupational Stress Questionnaire    Feeling of Stress : Only a little  Social Connections: Moderately Isolated (10/10/2023)   Social Connection and Isolation Panel [NHANES]    Frequency of Communication with Friends and Family: More than three times a week    Frequency of Social Gatherings with Friends and Family: Once a week    Attends Religious Services: 1 to 4 times per year    Active Member of [employer] or Organizations: No    Attends Banker Meetings: Never    Marital Status: Divorced  Catering manager Violence: Not At Risk (04/25/2023)   Humiliation, Afraid, Rape, and Kick questionnaire    Fear of Current or Ex-Partner: No    Emotionally Abused: No    Physically Abused: No    Sexually Abused: No    Review of Systems: All other review of systems negative except as mentioned in the HPI.  Physical Exam: Vital signs BP 116/70   Pulse (!) 110   Temp 97.9 F (36.6 C) (Temporal)   Ht 5\' 8"  (1.727 m)   Wt 190 lb (86.2 kg)   LMP  (LMP Unknown)   SpO2  97%   BMI 28.89 kg/m   General:   Alert,  Well-developed, pleasant and cooperative in NAD Lungs:  Clear throughout to auscultation.   Heart:  Regular rate and rhythm Abdomen:  Soft, nontender and  nondistended.   Neuro/Psych:  Alert and cooperative. Normal mood and affect. A and O x 3  Harlin Rain, MD [facility]

## 2023-11-07 NOTE — Op Note (Signed)
 [facility] Patient Name: Susan Cortez Procedure Date: 11/07/2023 3:24 PM MRN: [id number] Endoscopist: Viviann Spare P. Adela Lank , MD, [id number] Age: 63 Referring MD:  Date of Birth: Nov 12, 1960 Gender: Female Account #: [id number] Procedure:                Upper GI endoscopy Indications:              Dysphagia, Abnormal CT of the GI tract - history of                            radiation to the chest for lung cancer. Patient has                            dysphagia to water / liquids only, not solids.                            Feels it in oropharynx. Medicines:                Monitored Anesthesia Care Procedure:                Pre-Anesthesia Assessment:                           - Prior to the procedure, a History and Physical                            was performed, and patient medications and                            allergies were reviewed. The patient's tolerance of                            previous anesthesia was also reviewed. The risks                            and benefits of the procedure and the sedation                            options and risks were discussed with the patient.                            All questions were answered, and informed consent                            was obtained. Prior Anticoagulants: The patient has                            taken no anticoagulant or antiplatelet agents. ASA                            Grade Assessment: III - A patient with severe                            systemic disease. After reviewing the risks and  benefits, the patient was deemed in satisfactory                            condition to undergo the procedure.                           After obtaining informed consent, the endoscope was                            passed under direct vision. Throughout the                            procedure, the patient's blood pressure, pulse, and                            oxygen saturations  were monitored continuously. The                            Olympus Scope [id number] was introduced through the                            mouth, and advanced to the second part of duodenum.                            The upper GI endoscopy was accomplished without                            difficulty. The patient tolerated the procedure                            well. Scope In: Scope Out: Findings:                 Esophagogastric landmarks were identified: the                            Z-line was found at 37 cm, the gastroesophageal                            junction was found at 37 cm and the upper extent of                            the gastric folds was found at 40 cm from the                            incisors.                           A sliding 3 cm hiatal hernia was present. Not                            initially apparent until patient had some coughing                            under anesthesia.  LA Grade A esophagitis was found at the GEJ.                           A single area of ectopic gastric mucosa was found                            in the upper third of the esophagus.                           The exam of the esophagus was otherwise normal. No                            focal stenosis / stricture appreciated.                           A single sessile polypoid area was found at the                            gastroesophageal junction. Initially seen on                            retroflexed extending from the GEJ, but then within                            the hernia sac and seen better in forward view upon                            withdrawal. I suspect inflammatory in etiology in                            the setting of reflux. Biopsies were taken with a                            cold forceps for histology.                           Patchy mild inflammation characterized by erythema                            and granularity was  found in the gastric antrum.                            Biopsies were taken with a cold forceps for                            Helicobacter pylori testing.                           The exam of the stomach was otherwise normal.                           Two 2 to 4 mm sessile polyps were found in the  duodenal bulb. These polyps were removed with a                            cold snare. Resection and retrieval were complete.                           A single 3 mm sessile polyp was found in the second                            portion of the duodenum. The polyp was removed with                            a cold snare. Resection and retrieval were complete.                           Mucosal changes were found in the duodenal bulb -                            prominent folds, seemed unlikely to be                            subepithelial lesion. Biopsies were taken with a                            cold forceps for histology.                           The exam of the duodenum was otherwise normal. Complications:            No immediate complications. Estimated blood loss:                            Minimal. Estimated Blood Loss:     Estimated blood loss was minimal. Impression:               - Esophagogastric landmarks identified.                           - 3 cm hiatal hernia.                           - LA Grade A reflux esophagitis.                           - Ectopic gastric mucosa in the upper third of the                            esophagus.                           - Normal esophagus otherwise - no focal stenosis /                            stricture.                           -  A single gastroesophageal junction polypoid area,                            suspect inflammatory. Biopsied.                           - Gastritis. Biopsied.                           - Normal stomach otherwise.                           - Two duodenal polyps. Resected and  retrieved.                           - A single duodenal polyp. Resected and retrieved.                           - Mucosal changes in the duodenal bulb. Biopsied.                           - Normal duodenum otherwise.                           No concerning esophageal pathology in regards to                            her dysphagia, which may more likely be                            oropharyngeal to liquids. Recommend modified barium                            swallow as next step in evaluation. Recommendation:           - Patient has a contact number available for                            emergencies. The signs and symptoms of potential                            delayed complications were discussed with the                            patient. Return to normal activities tomorrow.                            Written discharge instructions were provided to the                            patient.                           - Resume previous diet.                           - Continue present  medications.                           - Start omeprazole 20mg  / day for reflux                            esophagitis and gastritis                           - Await pathology results.                           - Schedule modified barium swallow Viviann Spare P. Anaka Beazer, MD 11/07/2023 4:51:55 PM This report has been signed electronically.

## 2023-11-07 NOTE — Progress Notes (Signed)
 Called to room to assist during endoscopic procedure.  Patient ID and intended procedure confirmed with present staff. Received instructions for my participation in the procedure from the performing physician.

## 2023-11-07 NOTE — Op Note (Signed)
 [facility] Patient Name: Susan Cortez Procedure Date: 11/07/2023 3:23 PM MRN: [id number] Endoscopist: Viviann Spare P. Adela Lank , MD, [id number] Age: 63 Referring MD:  Date of Birth: 03-Jun-1961 Gender: Female Account #: [id number] Procedure:                Colonoscopy Indications:              High risk colon cancer surveillance: Personal                            history of colonic polyps - adenoma / sessile                            serrated polyps - removed 09/2020 Medicines:                Monitored Anesthesia Care Procedure:                Pre-Anesthesia Assessment:                           - Prior to the procedure, a History and Physical                            was performed, and patient medications and                            allergies were reviewed. The patient's tolerance of                            previous anesthesia was also reviewed. The risks                            and benefits of the procedure and the sedation                            options and risks were discussed with the patient.                            All questions were answered, and informed consent                            was obtained. Prior Anticoagulants: The patient has                            taken no anticoagulant or antiplatelet agents. ASA                            Grade Assessment: III - A patient with severe                            systemic disease. After reviewing the risks and                            benefits, the patient was deemed in satisfactory  condition to undergo the procedure.                           After obtaining informed consent, the colonoscope                            was passed under direct vision. Throughout the                            procedure, the patient's blood pressure, pulse, and                            oxygen saturations were monitored continuously. The                            [id number]-[id number] Slim SN  [id number] was introduced through                            the anus and advanced to the the cecum, identified                            by appendiceal orifice and ileocecal valve. The                            colonoscopy was technically difficult and complex                            due to restricted mobility of the colon. The                            patient tolerated the procedure well. The quality                            of the bowel preparation was adequate. The                            ileocecal valve, appendiceal orifice, and rectum                            were photographed. Scope In: 4:08:26 PM Scope Out: 4:32:24 PM Scope Withdrawal Time: 0 hours 17 minutes 57 seconds  Total Procedure Duration: 0 hours 23 minutes 58 seconds  Findings:                 The perianal and digital rectal examinations were                            normal.                           A 3 mm polyp was found in the ascending colon. The                            polyp was sessile. The polyp was removed with a  cold snare. Resection was complete, but the polyp                            tissue was not retrieved.                           A 3 mm polyp was found in the transverse colon. The                            polyp was sessile. The polyp was removed with a                            cold snare. Resection and retrieval were complete.                           A 3 mm polyp was found in the recto-sigmoid colon.                            The polyp was sessile. The polyp was removed with a                            cold snare. Resection and retrieval were complete.                           A few diverticula were found in the sigmoid colon.                            In the distal sigmoid colon there was restricted                            mobility and luminal narrowing. A regular pediatric                            colonoscope could not traverse this area. This  was                            removed and replaced with an ultra slim pediatric                            colonoscope. This was able to traverse the area                            much easier without difficultly, however looping                            occured in the right colon with this colonoscope,                            making for difficult cecal intubation.                           Internal hemorrhoids were found during retroflexion.  The exam was otherwise without abnormality. Complications:            No immediate complications. Estimated blood loss:                            Minimal. Estimated Blood Loss:     Estimated blood loss was minimal. Impression:               - One 3 mm polyp in the ascending colon, removed                            with a cold snare. Complete resection. Polyp tissue                            not retrieved.                           - One 3 mm polyp in the transverse colon, removed                            with a cold snare. Resected and retrieved.                           - One 3 mm polyp at the recto-sigmoid colon,                            removed with a cold snare. Resected and retrieved.                           - Diverticulosis in the sigmoid colon with                            restricted mobility and luminal narrowing. Use of                            ultra slim pediatric colonoscope was required to                            traverse this area.                           - Internal hemorrhoids.                           - The examination was otherwise normal. Recommendation:           - Patient has a contact number available for                            emergencies. The signs and symptoms of potential                            delayed complications were discussed with the                            patient. Return  to normal activities tomorrow.                            Written discharge  instructions were provided to the                            patient.                           - Resume previous diet.                           - Continue present medications.                           - Await pathology results. Viviann Spare P. Leeandra Ellerson, MD 11/07/2023 4:40:38 PM This report has been signed electronically.

## 2023-11-07 NOTE — Progress Notes (Signed)
 Report given to PACU, vss

## 2023-11-07 NOTE — Progress Notes (Signed)
 1555  Pt experienced laryngeal spasm with jaw thrust performed. Nasopharyngeal airway size 7.0  placed without trauma, vss

## 2023-11-07 NOTE — Progress Notes (Signed)
1538 HR > 100 with esmolol 25 mg given IV, MD updated, vss

## 2023-11-08 ENCOUNTER — Telehealth: Payer: Self-pay | Admitting: *Deleted

## 2023-11-08 NOTE — Telephone Encounter (Signed)
  Follow up Call-     11/07/2023    2:31 PM  Call back number  Post procedure Call Back phone  # [phone]  Permission to leave phone message Yes     Patient questions:  Do you have a fever, pain , or abdominal swelling? No. Pain Score  0 *  Have you tolerated food without any problems? Yes.    Have you been able to return to your normal activities? Yes.    Do you have any questions about your discharge instructions: Diet   No. Medications  No. Follow up visit  No.  Do you have questions or concerns about your Care? No.  Actions: * If pain score is 4 or above: No action needed, pain <4.

## 2023-11-12 LAB — SURGICAL PATHOLOGY

## 2023-11-13 ENCOUNTER — Encounter: Payer: Self-pay | Admitting: Gastroenterology

## 2023-11-13 ENCOUNTER — Encounter: Payer: Self-pay | Admitting: Internal Medicine

## 2023-11-14 ENCOUNTER — Other Ambulatory Visit: Payer: Self-pay

## 2023-11-14 DIAGNOSIS — R1310 Dysphagia, unspecified: Secondary | ICD-10-CM

## 2023-11-15 ENCOUNTER — Encounter: Payer: Self-pay | Admitting: Internal Medicine

## 2023-11-15 ENCOUNTER — Telehealth (HOSPITAL_COMMUNITY): Payer: Self-pay | Admitting: *Deleted

## 2023-11-15 ENCOUNTER — Other Ambulatory Visit (HOSPITAL_COMMUNITY): Payer: Self-pay | Admitting: *Deleted

## 2023-11-15 DIAGNOSIS — R1310 Dysphagia, unspecified: Secondary | ICD-10-CM

## 2023-11-15 NOTE — Telephone Encounter (Signed)
 Attempted to contact patient to schedule OP MBS. Left VM. RKEEL

## 2023-11-18 ENCOUNTER — Encounter: Payer: Self-pay | Admitting: Gastroenterology

## 2023-11-18 ENCOUNTER — Ambulatory Visit (HOSPITAL_COMMUNITY)
Admission: RE | Admit: 2023-11-18 | Discharge: 2023-11-18 | Disposition: A | Discharge status: Home / Self Care | Source: Ambulatory Visit | Attending: Family Medicine | Admitting: Family Medicine

## 2023-11-18 ENCOUNTER — Ambulatory Visit (HOSPITAL_COMMUNITY)
Admission: RE | Admit: 2023-11-18 | Discharge: 2023-11-18 | Disposition: A | Discharge status: Home / Self Care | Source: Ambulatory Visit | Attending: Family Medicine

## 2023-11-18 DIAGNOSIS — R1314 Dysphagia, pharyngoesophageal phase: Secondary | ICD-10-CM | POA: Diagnosis not present

## 2023-11-18 DIAGNOSIS — R1310 Dysphagia, unspecified: Secondary | ICD-10-CM

## 2023-11-18 DIAGNOSIS — R059 Cough, unspecified: Secondary | ICD-10-CM | POA: Diagnosis not present

## 2023-11-18 NOTE — Therapy (Signed)
 Modified Barium Swallow Study  Patient Details  Name: Susan Cortez MRN: [id number] Date of Birth: 08/31/1960  Today's Date: 11/18/2023  Modified Barium Swallow completed.  Full report located under Chart Review in the Imaging Section.  History of Present Illness Susan Cortez is a 63 y.o. female with PMH: small cell lung cancer, s/p radiation to chest/brain, GERD, HLD, COPD, stroke, tobacco use (1ppd), arthritis, basal cell carcinoma. She presented to this OP MBS as referred by GI (Dr. Fritzi Mandes) following Susan Cortez' c/o 6 month h/o swallowing difficulty with water and with medications. EGD performed on 11/07/23 showed a hiatal hernia and mild inflammation of esophagus and stomach, but nothing to explain her symptoms. During discussion with SLP prior to [facility], patient reported that "sometimes" when she takes her pills, she will throw them up with the water.   Clinical Impression Patient presents with a mild pharyngoesophageal dysphagia as per this MBS. With thin and nectar thick liquids, swallow was initiated at level of the posterior laryngeal surface of the epiglottis and pyriform sinus. Anterior hyoid excursion, laryngeal elevation, epiglottic inversion all appeared complete. Mild amount of residuals observed on tongue base and vallecular sinus s/p initial swallows but cleared with subsequent swallows. Prominent cricopharyngeal bar observed but no significant impact on barium flow through PES and esophagus. 13mm barium tablet taken with thin liquid barium transited through pharynx, PES, esophagus without difficulty or delay. No retrograde movement of barium or barium stasis observed at or below PES. Patient's symptoms of difficulty swallowing water and pills, resulting in instances of regurgitation of pills and water is not explained by this test. SLP suspects h er symptoms are related to her esophageal dysphagia/GERD.  Swallow Evaluation Recommendations Recommendations: PO diet PO Diet  Recommendation: Regular;Thin liquids (Level 0) Liquid Administration via: Cup;Straw Medication Administration: Whole meds with liquid Postural changes: Stay upright 30-60 min after meals;Position pt fully upright for meals    Angela Nevin, MA, CCC-SLP Speech Therapy

## 2023-11-22 ENCOUNTER — Encounter (HOSPITAL_COMMUNITY)

## 2023-12-05 ENCOUNTER — Other Ambulatory Visit: Payer: Self-pay | Admitting: Family Medicine

## 2023-12-05 ENCOUNTER — Encounter: Payer: Self-pay | Admitting: Family Medicine

## 2023-12-05 DIAGNOSIS — R32 Unspecified urinary incontinence: Secondary | ICD-10-CM

## 2023-12-05 NOTE — Telephone Encounter (Signed)
 Insurance no longer covering myrbetriq, I don't see that she has failed any other alternatives- ex; oxybutynin, tolterodine. Please advise.

## 2023-12-16 ENCOUNTER — Encounter (HOSPITAL_COMMUNITY): Payer: Self-pay

## 2023-12-16 ENCOUNTER — Inpatient Hospital Stay: Payer: 59 | Attending: Internal Medicine

## 2023-12-16 ENCOUNTER — Ambulatory Visit (HOSPITAL_COMMUNITY)
Admission: RE | Admit: 2023-12-16 | Discharge: 2023-12-16 | Disposition: A | Discharge status: Home / Self Care | Source: Ambulatory Visit | Attending: Internal Medicine | Admitting: Internal Medicine

## 2023-12-16 DIAGNOSIS — J929 Pleural plaque without asbestos: Secondary | ICD-10-CM | POA: Diagnosis not present

## 2023-12-16 DIAGNOSIS — C3490 Malignant neoplasm of unspecified part of unspecified bronchus or lung: Secondary | ICD-10-CM | POA: Diagnosis not present

## 2023-12-16 DIAGNOSIS — J432 Centrilobular emphysema: Secondary | ICD-10-CM | POA: Diagnosis not present

## 2023-12-16 LAB — CBC WITH DIFFERENTIAL (CANCER CENTER ONLY)
Abs Immature Granulocytes: 0.03 10*3/uL (ref 0.00–0.07)
Basophils Absolute: 0.1 10*3/uL (ref 0.0–0.1)
Basophils Relative: 1 %
Eosinophils Absolute: 0.1 10*3/uL (ref 0.0–0.5)
Eosinophils Relative: 2 %
HCT: 45.2 % (ref 36.0–46.0)
Hemoglobin: 15.4 g/dL — ABNORMAL HIGH (ref 12.0–15.0)
Immature Granulocytes: 0 %
Lymphocytes Relative: 22 %
Lymphs Abs: 1.9 10*3/uL (ref 0.7–4.0)
MCH: 31.4 pg (ref 26.0–34.0)
MCHC: 34.1 g/dL (ref 30.0–36.0)
MCV: 92.1 fL (ref 80.0–100.0)
Monocytes Absolute: 0.7 10*3/uL (ref 0.1–1.0)
Monocytes Relative: 8 %
Neutro Abs: 6 10*3/uL (ref 1.7–7.7)
Neutrophils Relative %: 67 %
Platelet Count: 262 10*3/uL (ref 150–400)
RBC: 4.91 MIL/uL (ref 3.87–5.11)
RDW: 14.3 % (ref 11.5–15.5)
WBC Count: 8.8 10*3/uL (ref 4.0–10.5)
nRBC: 0 % (ref 0.0–0.2)

## 2023-12-16 LAB — CMP (CANCER CENTER ONLY)
ALT: 21 U/L (ref 0–44)
AST: 24 U/L (ref 15–41)
Albumin: 4.9 g/dL (ref 3.5–5.0)
Alkaline Phosphatase: 51 U/L (ref 38–126)
Anion gap: 7 (ref 5–15)
BUN: 6 mg/dL — ABNORMAL LOW (ref 8–23)
CO2: 28 mmol/L (ref 22–32)
Calcium: 10.1 mg/dL (ref 8.9–10.3)
Chloride: 102 mmol/L (ref 98–111)
Creatinine: 0.9 mg/dL (ref 0.44–1.00)
GFR, Estimated: >60 mL/min (ref >60)
Glucose, Bld: 113 mg/dL — ABNORMAL HIGH (ref 70–99)
Potassium: 4.2 mmol/L (ref 3.5–5.1)
Sodium: 137 mmol/L (ref 135–145)
Total Bilirubin: 0.5 mg/dL (ref 0.0–1.2)
Total Protein: 7.8 g/dL (ref 6.5–8.1)

## 2023-12-16 MED ORDER — IOHEXOL 300 MG/ML  SOLN
75.0000 mL | Freq: Once | INTRAMUSCULAR | Status: AC | PRN
  Administered 2023-12-16: 75 mL via INTRAVENOUS

## 2023-12-16 MED ORDER — SODIUM CHLORIDE (PF) 0.9 % IJ SOLN
INTRAMUSCULAR | Status: AC
  Filled 2023-12-16: qty 50

## 2023-12-26 ENCOUNTER — Inpatient Hospital Stay: Payer: 59 | Attending: Internal Medicine | Admitting: Internal Medicine

## 2023-12-26 VITALS — BP 134/76 | HR 96 | Temp 98.2°F | Resp 16 | Ht 66.0 in | Wt 191.6 lb

## 2023-12-26 DIAGNOSIS — Z923 Personal history of irradiation: Secondary | ICD-10-CM | POA: Insufficient documentation

## 2023-12-26 DIAGNOSIS — Z85828 Personal history of other malignant neoplasm of skin: Secondary | ICD-10-CM | POA: Diagnosis not present

## 2023-12-26 DIAGNOSIS — Z860101 Personal history of adenomatous and serrated colon polyps: Secondary | ICD-10-CM | POA: Insufficient documentation

## 2023-12-26 DIAGNOSIS — M542 Cervicalgia: Secondary | ICD-10-CM | POA: Insufficient documentation

## 2023-12-26 DIAGNOSIS — M129 Arthropathy, unspecified: Secondary | ICD-10-CM | POA: Diagnosis not present

## 2023-12-26 DIAGNOSIS — J449 Chronic obstructive pulmonary disease, unspecified: Secondary | ICD-10-CM | POA: Diagnosis not present

## 2023-12-26 DIAGNOSIS — R599 Enlarged lymph nodes, unspecified: Secondary | ICD-10-CM | POA: Insufficient documentation

## 2023-12-26 DIAGNOSIS — K219 Gastro-esophageal reflux disease without esophagitis: Secondary | ICD-10-CM | POA: Insufficient documentation

## 2023-12-26 DIAGNOSIS — Z8673 Personal history of transient ischemic attack (TIA), and cerebral infarction without residual deficits: Secondary | ICD-10-CM | POA: Insufficient documentation

## 2023-12-26 DIAGNOSIS — N3281 Overactive bladder: Secondary | ICD-10-CM | POA: Diagnosis not present

## 2023-12-26 DIAGNOSIS — C3490 Malignant neoplasm of unspecified part of unspecified bronchus or lung: Secondary | ICD-10-CM

## 2023-12-26 DIAGNOSIS — R519 Headache, unspecified: Secondary | ICD-10-CM | POA: Diagnosis not present

## 2023-12-26 DIAGNOSIS — C3411 Malignant neoplasm of upper lobe, right bronchus or lung: Secondary | ICD-10-CM | POA: Diagnosis not present

## 2023-12-26 DIAGNOSIS — I700 Atherosclerosis of aorta: Secondary | ICD-10-CM | POA: Diagnosis not present

## 2023-12-26 DIAGNOSIS — E785 Hyperlipidemia, unspecified: Secondary | ICD-10-CM | POA: Insufficient documentation

## 2023-12-26 DIAGNOSIS — Z9221 Personal history of antineoplastic chemotherapy: Secondary | ICD-10-CM | POA: Diagnosis not present

## 2023-12-26 DIAGNOSIS — J439 Emphysema, unspecified: Secondary | ICD-10-CM | POA: Diagnosis not present

## 2023-12-26 DIAGNOSIS — K760 Fatty (change of) liver, not elsewhere classified: Secondary | ICD-10-CM | POA: Insufficient documentation

## 2023-12-26 DIAGNOSIS — Z79899 Other long term (current) drug therapy: Secondary | ICD-10-CM | POA: Diagnosis not present

## 2023-12-26 NOTE — Progress Notes (Signed)
 [facility] Cortez - [facility] [phone]   Fax:([phone]) [phone]  OFFICE PROGRESS NOTE  Copland, Skipper Dumas, MD [address] [place] [address]   DIAGNOSIS: Limited stage (T1c, N2, M0) Small Cell Lung Cancer. She presented with a right upper lobe suprahilar nodule/adenopathy, right paratracheal lymph node, and a hypermetabolic right upper lobe nodule. She was diagnosed in February 2022.    PRIOR THERAPY:   1) Systemic chemotherapy with cisplatin  80 mg/m2 on day 1, etoposide  100 mg/m2 on days 1, 2, and 3 IV every 3 weeks with concurrent radiation.  Last dose 02/01/21. Status post 4 cycles.  2) prophylactic cranial irradiation under the care of Dr. Jeryl Moris.  Last treatment on 03/28/2021   CURRENT THERAPY: Observation   INTERVAL HISTORY: [name] 63 y.o. female returns to the clinic today for follow-up visit.Discussed the use of AI scribe software for clinical note transcription with the patient, who gave verbal consent to proceed.  History of Present Illness   Susan Cortez is a 63 year old female with limited stage small cell lung cancer who presents for evaluation with repeat CT scan of the chest for restage of her disease.  Diagnosed with limited stage small cell lung cancer in February 2022, she completed systemic chemotherapy with cisplatin  and etoposide  concurrent with radiation, followed by prophylactic cranial radiation. She has been under observation since June 2022. Recent CT imaging reveals a nodule in the superior segment of the right lower lobe, which has increased slightly in size, and soft tissue in the right hilar area, which has decreased slightly in size. She is anxious about these findings.  She feels tired and experiences her usual neck pain and aches. No new chest pain, breathing issues, or hemoptysis. She has occasional splitting headaches at night, occurring on both sides of her frontal head, lasting about twenty seconds, but none in  the past few weeks.  She had her port removed and notes a lump at the site, which she attributes to scarring. She recalls an unusual sensation of 'electricity' during a previous endoscopy and colonoscopy procedure.  She is planning to travel to [place]  from June 5th to 11th for a family event, including her brother's last celebration of life and a family reunion.       MEDICAL HISTORY: Past Medical History:  Diagnosis Date   Allergy    Arthritis    Basal cell carcinoma 2018   Removed from Right neck per patient    COPD (chronic obstructive pulmonary disease) (HCC) 06/2019   Diverticulosis 2013   Dyslipidemia 03/01/2018   GERD (gastroesophageal reflux disease) 04/2021   HLD (hyperlipidemia)    Hx of adenomatous colonic polyps 2013   OAB (overactive bladder)    Pre-diabetes    Small cell lung cancer (HCC) 11/09/2020   Stroke (HCC) 2022    ALLERGIES:  is allergic to lyrica [pregabalin], butrans  [buprenorphine ], chantix [varenicline], diclofenac , doxycycline , meloxicam, tolmetin, and nsaids.  MEDICATIONS:  Current Outpatient Medications  Medication Sig Dispense Refill   acetaminophen -codeine  (TYLENOL  #4) 300-60 MG tablet Take 1 tablet by mouth daily as needed for moderate pain (pain score 4-6). 30 tablet 2   amoxicillin -clavulanate (AUGMENTIN ) 875-125 MG tablet Take 1 tablet by mouth 2 (two) times daily. 20 tablet 0   benzonatate  (TESSALON ) 200 MG capsule Take 1 capsule (200 mg total) by mouth 2 (two) times daily as needed for cough. 20 capsule 0   CALCIUM PO Take 650 mg by  mouth daily.     Cholecalciferol (VITAMIN D3) 125 MCG (5000 UT) CAPS Take 5,000 Units by mouth daily.     Cyanocobalamin  (B-12) 100 MCG TABS      fluocinonide (LIDEX) 0.05 % external solution Apply 1 Application topically as needed.     guaiFENesin  (MUCINEX ) 600 MG 12 hr tablet Take 2 tablets (1,200 mg total) by mouth 2 (two) times daily. 30 tablet 0   linaclotide  (LINZESS ) 145 MCG CAPS capsule Take 1 capsule  (145 mcg total) by mouth daily before breakfast. Take 30-60 minutes before breakfast 90 capsule 3   LORazepam  (ATIVAN ) 0.5 MG tablet TAKE 1 TABLET 30 MINUTES PRIOR TO MRI OR RADIATION (Patient not taking: Reported on 11/07/2023) 5 tablet 0   lovastatin  (MEVACOR ) 20 MG tablet TAKE 1 TABLET BY MOUTH EVERYDAY AT BEDTIME 90 tablet 1   metoprolol  succinate (TOPROL -XL) 25 MG 24 hr tablet TAKE 1/2 TABLET BY MOUTH DAILY 45 tablet 3   Multiple Vitamins-Minerals (MULTIVITAMIN ADULT, MINERALS,) TABS      omeprazole  (PRILOSEC) 20 MG capsule Take 1 capsule (20 mg total) by mouth daily. 90 capsule 3   oxybutynin (DITROPAN XL) 15 MG 24 hr tablet Take 1 tablet (15 mg total) by mouth daily. 90 tablet 1   No current facility-administered medications for this visit.    SURGICAL HISTORY:  Past Surgical History:  Procedure Laterality Date   CESAREAN SECTION     X 3   COLONOSCOPY  2013, 2016, 2019   hx colon polyps   DILATION AND CURETTAGE OF UTERUS     FUDUCIAL PLACEMENT Right 10/18/2020   Procedure: PLACEMENT OF FUDUCIAL;  Surgeon: Denson Flake, MD;  Location: [facility] OR;  Service: Thoracic;  Laterality: Right;   IR IMAGING GUIDED PORT INSERTION  11/29/2020   IR REMOVAL TUN ACCESS W/ PORT W/O FL MOD SED  05/08/2022   SHOULDER ARTHROSCOPY WITH OPEN ROTATOR CUFF REPAIR Right 05/2009   TONSILLECTOMY  1977   TUBAL LIGATION     VIDEO BRONCHOSCOPY WITH ENDOBRONCHIAL NAVIGATION N/A 10/18/2020   Procedure: VIDEO BRONCHOSCOPY WITH ENDOBRONCHIAL NAVIGATION;  Surgeon: Denson Flake, MD;  Location: [facility] OR;  Service: Thoracic;  Laterality: N/A;   VIDEO BRONCHOSCOPY WITH ENDOBRONCHIAL ULTRASOUND N/A 10/18/2020   Procedure: VIDEO BRONCHOSCOPY WITH ENDOBRONCHIAL ULTRASOUND;  Surgeon: Denson Flake, MD;  Location: [facility] OR;  Service: Thoracic;  Laterality: N/A;   WISDOM TOOTH EXTRACTION      REVIEW OF SYSTEMS:  Constitutional: positive for fatigue Eyes: negative Ears, nose, mouth, throat, and face: negative Respiratory:  negative Cardiovascular: negative Gastrointestinal: negative Genitourinary:negative Integument/breast: negative Hematologic/lymphatic: negative Musculoskeletal:negative Neurological: positive for headaches Behavioral/Psych: negative Endocrine: negative Allergic/Immunologic: negative   PHYSICAL EXAMINATION: General appearance: alert, cooperative, and no distress Head: Normocephalic, without obvious abnormality, atraumatic Neck: no adenopathy, no JVD, supple, symmetrical, trachea midline, and thyroid  not enlarged, symmetric, no tenderness/mass/nodules Lymph nodes: Cervical, supraclavicular, and axillary nodes normal. Resp: clear to auscultation bilaterally Back: symmetric, no curvature. ROM normal. No CVA tenderness. Cardio: regular rate and rhythm, S1, S2 normal, no murmur, click, rub or gallop GI: soft, non-tender; bowel sounds normal; no masses,  no organomegaly Extremities: extremities normal, atraumatic, no cyanosis or edema Neurologic: Alert and oriented X 3, normal strength and tone. Normal symmetric reflexes. Normal coordination and gait  ECOG PERFORMANCE STATUS: 1 - Symptomatic but completely ambulatory  Blood pressure 134/76, pulse 96, temperature 98.2 F (36.8 C), temperature source Temporal, resp. rate 16, height 5\' 6"  (1.676 m), weight 191 lb 9.6 oz (86.9  kg), SpO2 99%.  LABORATORY DATA: Lab Results  Component Value Date   WBC 8.8 12/16/2023   HGB 15.4 (H) 12/16/2023   HCT 45.2 12/16/2023   MCV 92.1 12/16/2023   PLT 262 12/16/2023      Chemistry      Component Value Date/Time   NA 137 12/16/2023 0924   K 4.2 12/16/2023 0924   CL 102 12/16/2023 0924   CO2 28 12/16/2023 0924   BUN 6 (L) 12/16/2023 0924   CREATININE 0.90 12/16/2023 0924   CREATININE 0.65 06/08/2020 1050      Component Value Date/Time   CALCIUM 10.1 12/16/2023 0924   ALKPHOS 51 12/16/2023 0924   AST 24 12/16/2023 0924   ALT 21 12/16/2023 0924   BILITOT 0.5 12/16/2023 0924        RADIOGRAPHIC STUDIES: CT Chest W Contrast Result Date: 12/19/2023 CLINICAL DATA:  Staging small-cell lung cancer. Chemotherapy and XRT complete. Cough and right anterior chest pain in the location of prior port. * Tracking Code: BO * EXAM: CT CHEST WITH CONTRAST TECHNIQUE: Multidetector CT imaging of the chest was performed during intravenous contrast administration. RADIATION DOSE REDUCTION: This exam was performed according to the departmental dose-optimization program which includes automated exposure control, adjustment of the mA and/or kV according to patient size and/or use of iterative reconstruction technique. CONTRAST:  75mL OMNIPAQUE  IOHEXOL  300 MG/ML  SOLN COMPARISON:  CT 06/18/2023 and older. FINDINGS: Cardiovascular: Thoracic aorta is normal course and caliber with scattered atherosclerotic partially calcified plaque. Heart is nonenlarged. Trace pericardial fluid. Mediastinum/Nodes: Patulous esophagus with segmental areas of wall thickening and ectasia. Similar in appearance to previous. Small cystic area in the right thyroid  lobe is again noted measuring 13 mm. No specific abnormal lymph node enlargement identified in the axillary regions, left hilum. There are several small less than 1 cm size mediastinal nodes identified, unchanged from previous. Example subcarinal node on the right side has short axis today of 8 mm and previously 7 mm. There is soft tissue fullness involving the right lung hilum which is similar in distribution to previous examination. These could be nodes or other tissue. Today focus on series 2, image 58 measures 16 x 13 mm and previously would have measured 17 x 17 mm. Slightly smaller. Lungs/Pleura: Centrilobular emphysematous lung changes identified. Left lung is without consolidation, pneumothorax or effusion. The reticulonodular opacity seen in the right upper lobe extending to the hilum with bronchial wall thickening is similar extent and distribution to previous  examination. Fiduciary markers are again seen along small nodules which are unchanged in size from previous examination. Some involvement as well of the superior segment of the right lower lobe. Some volume loss. The separate spiculated cavitary nodule in the right lower lobe anteriorly is larger today. Previously measuring 7 x 6 mm in overall size and today 13 by 8 mm pre the soft tissue nodular components are also significantly increased. Upper Abdomen: Stable left adrenal nodules, previously described adenomas as per history. Right adrenal glands preserved. Fatty liver infiltration. Musculoskeletal: Scattered degenerative changes. IMPRESSION: Spiculated nodule in the right lower lobe is significantly increased in size from previous now overall dimension of 13 mm. An aggressive lesion is possible. Recommend further workup such as PET-CT scan versus close follow-up CT in 3 months. Persistent soft tissue nodular thickening at the right lung hilum extension along the right upper lobe bronchi with interstitial thickening, scarring and fibrotic changes. Areas of nodularity with pleural thickening and fiduciary markers are unchanged in appearance  from the prior. Again this could relate to evolving posttreatment changes and recommend continued follow-up. Persistent areas of wall thickening and ectasia of the esophagus. Please correlate with specific symptoms or prior workup Aortic Atherosclerosis (ICD10-I70.0) and Emphysema (ICD10-J43.9). Electronically Signed   By: Adrianna Horde M.D.   On: 12/19/2023 10:41    ASSESSMENT AND PLAN: This is a very pleasant 63 years old white female with limited stage (T1c, N2, M0) small cell lung cancer presented with right upper lobe and suprahilar mass in addition to mediastinal lymphadenopathy diagnosed in February 2022.  The patient underwent systemic chemotherapy with cisplatin  80 mg/M2 on day 1 and etoposide  100 mg/M2 on days 1, 2 and 3 every 3 weeks status post 4 cycles. This was  concurrent with radiotherapy followed by prophylactic cranial irradiation. She had repeat CT scan of the chest performed recently.  I personally and independently reviewed the scan images and discussed the result and showed the images to the patient today.  PET scan showed aggressive appearing spiculated nodule in the right lower lobe significantly increased in size. There was also right hilar soft tissue density.    Limited stage small cell lung cancer Limited stage small cell lung cancer diagnosed in February 2022. Post systemic chemotherapy with cisplatin  and etoposide , concurrent with radiation, followed by prophylactic cranial radiation. Under observation since June 2022. Recent CT scan shows a nodule in the superior segment of the right lower lobe, slightly increased in size, suspicious for cancer but could be inflammation. Soft tissue in the right hilar area decreased slightly in size but is of more concern. PET scan recommended to further evaluate these findings. - Order PET scan to evaluate the nodule and soft tissue in the right hilar area - Schedule PET scan for next week - If PET scan is suspicious for cancer, consider brain MRI  Headaches Intermittent frontal headaches occurring at night, lasting about 20 seconds, with no recent episodes in the past few weeks. Occurs on both sides of the head.  Neck pain Chronic neck pain, described as usual aches, with no new symptoms reported.  Goals of Care She is planning to attend her brother's last celebration of life and a family reunion, indicating the importance of family events in her life.   The patient was advised to call immediately if she has any concerning symptoms in the interval. The patient voices understanding of current disease status and treatment options and is in agreement with the current care plan.  All questions were answered. The patient knows to call the clinic with any problems, questions or concerns. We can certainly  see the patient much sooner if necessary.  Disclaimer: This note was dictated with voice recognition software. Similar sounding words can inadvertently be transcribed and may not be corrected upon review.

## 2024-01-01 ENCOUNTER — Other Ambulatory Visit: Payer: Self-pay | Admitting: Family Medicine

## 2024-01-01 DIAGNOSIS — R32 Unspecified urinary incontinence: Secondary | ICD-10-CM

## 2024-01-01 MED ORDER — MIRABEGRON ER 50 MG PO TB24: 50.0000 mg | ORAL_TABLET | Freq: Every day | ORAL | 3 refills | Status: DC

## 2024-01-02 ENCOUNTER — Other Ambulatory Visit (HOSPITAL_COMMUNITY): Payer: Self-pay

## 2024-01-02 ENCOUNTER — Encounter: Payer: Self-pay | Admitting: Family Medicine

## 2024-01-02 ENCOUNTER — Telehealth: Payer: Self-pay

## 2024-01-02 ENCOUNTER — Encounter: Payer: Self-pay | Admitting: Internal Medicine

## 2024-01-02 ENCOUNTER — Encounter (HOSPITAL_COMMUNITY)
Admission: RE | Admit: 2024-01-02 | Discharge: 2024-01-02 | Disposition: A | Discharge status: Home / Self Care | Source: Ambulatory Visit | Attending: Internal Medicine | Admitting: Internal Medicine

## 2024-01-02 DIAGNOSIS — C3490 Malignant neoplasm of unspecified part of unspecified bronchus or lung: Secondary | ICD-10-CM | POA: Diagnosis not present

## 2024-01-02 DIAGNOSIS — R32 Unspecified urinary incontinence: Secondary | ICD-10-CM

## 2024-01-02 LAB — GLUCOSE, CAPILLARY: Glucose-Capillary: 105 mg/dL — ABNORMAL HIGH (ref 70–99)

## 2024-01-02 MED ORDER — FLUDEOXYGLUCOSE F - 18 (FDG) INJECTION
9.7000 | Freq: Once | INTRAVENOUS | Status: AC
  Administered 2024-01-02: 9.7 via INTRAVENOUS

## 2024-01-02 MED ORDER — MIRABEGRON ER 50 MG PO TB24: 50.0000 mg | ORAL_TABLET | Freq: Every day | ORAL | 3 refills | Status: DC

## 2024-01-02 NOTE — Telephone Encounter (Signed)
 Pharmacy Patient Advocate Encounter   Received notification from RX Request Messages that prior authorization for Myrbetriq  50mg  tabs is required/requested.   Insurance verification completed.   The patient is insured through [facility] .   Per test claim: PA required; PA submitted to above mentioned insurance via CoverMyMeds Key/confirmation #/EOC [id number] Status is pending

## 2024-01-02 NOTE — Addendum Note (Signed)
 Addended by: Gates Kasal C on: 01/02/2024 01:43 PM   Modules accepted: Orders

## 2024-01-02 NOTE — Telephone Encounter (Signed)
 Pharmacy Patient Advocate Encounter  Received notification from [employer] that Prior Authorization for Myrbetriq  50mg  tabs has been CANCELLED due to this medication or production on your plan's list of covered drugs. Prior authorization is not required at this time.   PA #/Case ID/Reference #: [id number]  Spoke with the pharmacist at [facility] and the insurance would not allow a DAW of 2.   Can you please send in a new order for Myrbetriq  50mg  DAW 1?

## 2024-01-08 ENCOUNTER — Encounter: Payer: Self-pay | Admitting: Internal Medicine

## 2024-01-16 ENCOUNTER — Inpatient Hospital Stay (HOSPITAL_BASED_OUTPATIENT_CLINIC_OR_DEPARTMENT_OTHER): Admitting: Internal Medicine

## 2024-01-16 ENCOUNTER — Encounter: Payer: Self-pay | Admitting: Radiation Oncology

## 2024-01-16 VITALS — BP 122/81 | HR 90 | Temp 97.7°F | Resp 15 | Wt 191.0 lb

## 2024-01-16 DIAGNOSIS — K760 Fatty (change of) liver, not elsewhere classified: Secondary | ICD-10-CM | POA: Diagnosis not present

## 2024-01-16 DIAGNOSIS — E785 Hyperlipidemia, unspecified: Secondary | ICD-10-CM | POA: Diagnosis not present

## 2024-01-16 DIAGNOSIS — Z85828 Personal history of other malignant neoplasm of skin: Secondary | ICD-10-CM | POA: Diagnosis not present

## 2024-01-16 DIAGNOSIS — C3411 Malignant neoplasm of upper lobe, right bronchus or lung: Secondary | ICD-10-CM | POA: Diagnosis not present

## 2024-01-16 DIAGNOSIS — Z860101 Personal history of adenomatous and serrated colon polyps: Secondary | ICD-10-CM | POA: Diagnosis not present

## 2024-01-16 DIAGNOSIS — Z8673 Personal history of transient ischemic attack (TIA), and cerebral infarction without residual deficits: Secondary | ICD-10-CM | POA: Diagnosis not present

## 2024-01-16 DIAGNOSIS — R599 Enlarged lymph nodes, unspecified: Secondary | ICD-10-CM | POA: Diagnosis not present

## 2024-01-16 DIAGNOSIS — Z9221 Personal history of antineoplastic chemotherapy: Secondary | ICD-10-CM | POA: Diagnosis not present

## 2024-01-16 DIAGNOSIS — J449 Chronic obstructive pulmonary disease, unspecified: Secondary | ICD-10-CM | POA: Diagnosis not present

## 2024-01-16 DIAGNOSIS — C3490 Malignant neoplasm of unspecified part of unspecified bronchus or lung: Secondary | ICD-10-CM

## 2024-01-16 DIAGNOSIS — R519 Headache, unspecified: Secondary | ICD-10-CM | POA: Diagnosis not present

## 2024-01-16 DIAGNOSIS — Z79899 Other long term (current) drug therapy: Secondary | ICD-10-CM | POA: Diagnosis not present

## 2024-01-16 DIAGNOSIS — K219 Gastro-esophageal reflux disease without esophagitis: Secondary | ICD-10-CM | POA: Diagnosis not present

## 2024-01-16 DIAGNOSIS — M542 Cervicalgia: Secondary | ICD-10-CM | POA: Diagnosis not present

## 2024-01-16 DIAGNOSIS — Z923 Personal history of irradiation: Secondary | ICD-10-CM | POA: Diagnosis not present

## 2024-01-16 DIAGNOSIS — N3281 Overactive bladder: Secondary | ICD-10-CM | POA: Diagnosis not present

## 2024-01-16 DIAGNOSIS — J439 Emphysema, unspecified: Secondary | ICD-10-CM | POA: Diagnosis not present

## 2024-01-16 DIAGNOSIS — I700 Atherosclerosis of aorta: Secondary | ICD-10-CM | POA: Diagnosis not present

## 2024-01-16 DIAGNOSIS — M129 Arthropathy, unspecified: Secondary | ICD-10-CM | POA: Diagnosis not present

## 2024-01-16 NOTE — Progress Notes (Signed)
 [facility] [phone]   [phone]  OFFICE PROGRESS NOTE  Copland, Skipper Dumas, MD [address] [place] [address]   DIAGNOSIS: Limited stage (T1c, N2, M0) Small Cell Lung Cancer. She presented with a right upper lobe suprahilar nodule/adenopathy, right paratracheal lymph node, and a hypermetabolic right upper lobe nodule. She was diagnosed in February 2022.    PRIOR THERAPY:   1) Systemic chemotherapy with cisplatin  80 mg/m2 on day 1, etoposide  100 mg/m2 on days 1, 2, and 3 IV every 3 weeks with concurrent radiation.  Last dose 02/01/21. Status post 4 cycles.  2) prophylactic cranial irradiation under the care of Dr. Jeryl Moris.  Last treatment on 03/28/2021   CURRENT THERAPY: Observation   INTERVAL HISTORY: Susan Cortez 63 y.o. female returns to the clinic today for follow-up visit. Discussed the use of AI scribe software for clinical note transcription with the patient, who gave verbal consent to proceed.  History of Present Illness   Susan Cortez is a 63 year old female with limited stage small cell lung cancer who presents for evaluation and discussion of her PET scan results.  Diagnosed with limited stage small cell lung cancer in February 2022, she underwent systemic chemotherapy with cisplatin  and etoposide  concurrent with radiation, with the last dose administered in June 2022. Prophylactic cranial irradiation was completed in August 2022, and she has been under observation since then.  A recent CT scan of the chest on December 16, 2023, revealed a speculated nodule in the right lower lobe that had increased in size compared to previous exams, along with soft tissue nodular thickening at the right lung hilum. A PET scan was ordered to further evaluate these findings.  No new chest pain, shortness of breath, or significant weight loss. She experiences occasional coughing with mucus production and a sensation of tightness in her  chest, but these symptoms are not persistent.  She has a history of smoking and is making ongoing attempts to quit.        MEDICAL HISTORY: Past Medical History:  Diagnosis Date   Allergy    Arthritis    Basal cell carcinoma 2018   Removed from Right neck per patient    COPD (chronic obstructive pulmonary disease) (HCC) 06/2019   Diverticulosis 2013   Dyslipidemia 03/01/2018   GERD (gastroesophageal reflux disease) 04/2021   HLD (hyperlipidemia)    Hx of adenomatous colonic polyps 2013   OAB (overactive bladder)    Pre-diabetes    Small cell lung cancer (HCC) 11/09/2020   Stroke (HCC) 2022    ALLERGIES:  is allergic to lyrica [pregabalin], butrans  [buprenorphine ], chantix [varenicline], diclofenac , doxycycline , meloxicam, tolmetin, and nsaids.  MEDICATIONS:  Current Outpatient Medications  Medication Sig Dispense Refill   acetaminophen -codeine  (TYLENOL  #4) 300-60 MG tablet Take 1 tablet by mouth daily as needed for moderate pain (pain score 4-6). 30 tablet 2   amoxicillin -clavulanate (AUGMENTIN ) 875-125 MG tablet Take 1 tablet by mouth 2 (two) times daily. 20 tablet 0   benzonatate  (TESSALON ) 200 MG capsule Take 1 capsule (200 mg total) by mouth 2 (two) times daily as needed for cough. 20 capsule 0   CALCIUM PO Take 650 mg by mouth daily.     Cholecalciferol (VITAMIN D3) 125 MCG (5000 UT) CAPS Take 5,000 Units by mouth daily.     Cyanocobalamin  (B-12) 100 MCG TABS      fluocinonide (LIDEX) 0.05 % external solution Apply 1  Application topically as needed.     guaiFENesin  (MUCINEX ) 600 MG 12 hr tablet Take 2 tablets (1,200 mg total) by mouth 2 (two) times daily. 30 tablet 0   linaclotide  (LINZESS ) 145 MCG CAPS capsule Take 1 capsule (145 mcg total) by mouth daily before breakfast. Take 30-60 minutes before breakfast 90 capsule 3   LORazepam  (ATIVAN ) 0.5 MG tablet TAKE 1 TABLET 30 MINUTES PRIOR TO MRI OR RADIATION (Patient not taking: Reported on 11/07/2023) 5 tablet 0    lovastatin  (MEVACOR ) 20 MG tablet TAKE 1 TABLET BY MOUTH EVERYDAY AT BEDTIME 90 tablet 1   metoprolol  succinate (TOPROL -XL) 25 MG 24 hr tablet TAKE 1/2 TABLET BY MOUTH DAILY 45 tablet 3   mirabegron  ER (MYRBETRIQ ) 50 MG TB24 tablet Take 1 tablet (50 mg total) by mouth daily. 90 tablet 3   Multiple Vitamins-Minerals (MULTIVITAMIN ADULT, MINERALS,) TABS      omeprazole  (PRILOSEC) 20 MG capsule Take 1 capsule (20 mg total) by mouth daily. 90 capsule 3   No current facility-administered medications for this visit.    SURGICAL HISTORY:  Past Surgical History:  Procedure Laterality Date   CESAREAN SECTION     X 3   COLONOSCOPY  2013, 2016, 2019   hx colon polyps   DILATION AND CURETTAGE OF UTERUS     FUDUCIAL PLACEMENT Right 10/18/2020   Procedure: PLACEMENT OF FUDUCIAL;  Surgeon: Denson Flake, MD;  Location: [facility] OR;  Service: Thoracic;  Laterality: Right;   IR IMAGING GUIDED PORT INSERTION  11/29/2020   IR REMOVAL TUN ACCESS W/ PORT W/O FL MOD SED  05/08/2022   SHOULDER ARTHROSCOPY WITH OPEN ROTATOR CUFF REPAIR Right 05/2009   TONSILLECTOMY  1977   TUBAL LIGATION     VIDEO BRONCHOSCOPY WITH ENDOBRONCHIAL NAVIGATION N/A 10/18/2020   Procedure: VIDEO BRONCHOSCOPY WITH ENDOBRONCHIAL NAVIGATION;  Surgeon: Denson Flake, MD;  Location: [facility] OR;  Service: Thoracic;  Laterality: N/A;   VIDEO BRONCHOSCOPY WITH ENDOBRONCHIAL ULTRASOUND N/A 10/18/2020   Procedure: VIDEO BRONCHOSCOPY WITH ENDOBRONCHIAL ULTRASOUND;  Surgeon: Denson Flake, MD;  Location: [facility] OR;  Service: Thoracic;  Laterality: N/A;   WISDOM TOOTH EXTRACTION      REVIEW OF SYSTEMS:  Constitutional: positive for fatigue Eyes: negative Ears, nose, mouth, throat, and face: negative Respiratory: positive for cough Cardiovascular: negative Gastrointestinal: negative Genitourinary:negative Integument/breast: negative Hematologic/lymphatic: negative Musculoskeletal:negative Neurological: negative Behavioral/Psych: negative Endocrine:  negative Allergic/Immunologic: negative   PHYSICAL EXAMINATION: General appearance: alert, cooperative, and no distress Head: Normocephalic, without obvious abnormality, atraumatic Neck: no adenopathy, no JVD, supple, symmetrical, trachea midline, and thyroid  not enlarged, symmetric, no tenderness/mass/nodules Lymph nodes: Cervical, supraclavicular, and axillary nodes normal. Resp: clear to auscultation bilaterally Back: symmetric, no curvature. ROM normal. No CVA tenderness. Cardio: regular rate and rhythm, S1, S2 normal, no murmur, click, rub or gallop GI: soft, non-tender; bowel sounds normal; no masses,  no organomegaly Extremities: extremities normal, atraumatic, no cyanosis or edema Neurologic: Alert and oriented X 3, normal strength and tone. Normal symmetric reflexes. Normal coordination and gait  ECOG PERFORMANCE STATUS: 1 - Symptomatic but completely ambulatory  Blood pressure 122/81, pulse 90, temperature 97.7 F (36.5 C), temperature source Temporal, resp. rate 15, weight 191 lb (86.6 kg), SpO2 98%.  LABORATORY DATA: Lab Results  Component Value Date   WBC 8.8 12/16/2023   HGB 15.4 (H) 12/16/2023   HCT 45.2 12/16/2023   MCV 92.1 12/16/2023   PLT 262 12/16/2023      Chemistry      Component  Value Date/Time   NA 137 12/16/2023 0924   K 4.2 12/16/2023 0924   CL 102 12/16/2023 0924   CO2 28 12/16/2023 0924   BUN 6 (L) 12/16/2023 0924   CREATININE 0.90 12/16/2023 0924   CREATININE 0.65 06/08/2020 1050      Component Value Date/Time   CALCIUM 10.1 12/16/2023 0924   ALKPHOS 51 12/16/2023 0924   AST 24 12/16/2023 0924   ALT 21 12/16/2023 0924   BILITOT 0.5 12/16/2023 0924       RADIOGRAPHIC STUDIES: NM PET Image Restage (PS) Skull Base to Thigh (F-18 FDG) Result Date: 01/07/2024 CLINICAL DATA:  Subsequent treatment strategy for small-cell lung cancer. Previous radiation. Status post chemotherapy. EXAM: NUCLEAR MEDICINE PET SKULL BASE TO THIGH TECHNIQUE: 9.7 mCi  F-18 FDG was injected intravenously. Full-ring PET imaging was performed from the skull base to thigh after the radiotracer. CT data was obtained and used for attenuation correction and anatomic localization. Fasting blood glucose: 105 mg/dl COMPARISON:  PET-CT 64/40/3474.  Chest CT 12/16/2023 and older FINDINGS: Mediastinal blood pool activity: SUV max 2.3 Liver activity: SUV max 3.2 NECK: No specific abnormal uptake above blood pool in the neck including along lymph node change of the submandibular, posterior triangle or internal jugular region. Near symmetric uptake of the visualized intracranial compartment. Incidental CT findings: Paranasal sinuses and mastoid air cells are grossly clear. There is streak artifact related to the patient's dental hardware. The parotid glands, submandibular glands unremarkable. Small right thyroid  nodule stable. Again no abnormal uptake. CHEST: No specific abnormal radiotracer uptake seen along the axillary regions, hilum or mediastinum. Small area of uptake along the right side of the mediastinum is along the right atrial appendage, nonspecific. The right lower lobe nodule seen on the recent CT scan measured 13 mm, today is again seen on image 84 series 4 and again measures 13 mm. However this area does not show any abnormal uptake. Maximum SUV value of the area measures 1.4. Stable appearance of the previous described reticulonodular changes in the right upper lobe extending from the hilum. Associated bronchial wall thickening. Again no abnormal uptake and could be evolution of posttreatment change. No areas of abnormal uptake along the lung parenchyma overall. Incidental CT findings: Thoracic aorta is normal course and caliber with some scattered calcified plaque. Patulous esophagus. Trace pericardial fluid or thickening. Heart is nonenlarged. Breathing motion. Emphysematous lung changes. No pneumothorax or effusion. ABDOMEN/PELVIS: Physiologic distribution radiotracer along  the parenchymal organs, bowel and renal collecting systems. Incidental CT findings: Grossly the liver, spleen, right adrenal gland is preserved. Gallbladder is present. Stable left adrenal low-attenuation nodule without uptake. Mild fatty atrophy of the pancreas. No renal or ureteral stones are identified. Preserved contour to the urinary bladder. Uterus is present. Large bowel has a normal course and caliber. Diffuse colonic stool. Normal appendix is not clearly seen in the right lower quadrant. Sigmoid colon diverticula. Normal caliber aorta. Scattered vascular calcifications. 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 No free air or free fluid. SKELETON: No abnormal uptake along the visualized osseous structures. Incidental CT findings: Scattered degenerative changes. IMPRESSION: Stable appearance to the presumed posttreatment changes in the right upper lobe of the lung with persistent reticular and nodular changes, bronchial wall thickening and a fiduciary marker. No abnormal uptake in this location. The spiculated nodule in the anterior aspect of the right lower lobe stable in size compared to the recent chest CT and does not show any abnormal uptake. This could be infectious or inflammatory  but again there is a differential and would recommend follow up surveillance with CT scan 3 months. Electronically Signed   By: Adrianna Horde M.D.   On: 01/07/2024 10:26    ASSESSMENT AND PLAN: This is a very pleasant 63 years old white female with limited stage (T1c, N2, M0) small cell lung cancer presented with right upper lobe and suprahilar mass in addition to mediastinal lymphadenopathy diagnosed in February 2022.  The patient underwent systemic chemotherapy with cisplatin  80 mg/M2 on day 1 and etoposide  100 mg/M2 on days 1, 2 and 3 every 3 weeks status post 4 cycles. This was concurrent with radiotherapy followed by prophylactic cranial irradiation. She had repeat CT scan of the chest showed aggressive  appearing spiculated nodule in the right lower lobe significantly increased in size. There was also right hilar soft tissue density. She had repeat PET scan performed recently.  I personally independently reviewed the PET scan images and discussed the result with the patient today.  Her PET scan showed no concerning findings for disease recurrence. Assessment and Plan    Small cell lung cancer 63 year old female with limited stage small cell lung cancer, diagnosed in February 2022. Completed systemic chemotherapy with cisplatin  and etoposide  concurrent with radiation, last dose in June 2022. Prophylactic cranial irradiation completed in August 2022. Recent CT scan on December 16, 2023, showed a speculated nodule in the right lower lobe with soft tissue nodular thickening at the right lung hilum. PET scan showed no concerning activity, indicating no current evidence of active cancer. Plan to continue surveillance with follow-up imaging. - Order CT scan of the chest in 3 months for surveillance - Coordinate with radiation oncology regarding brain MRI   The patient was advised to call immediately if she has any concerning symptoms in the interval. The patient voices understanding of current disease status and treatment options and is in agreement with the current care plan.  All questions were answered. The patient knows to call the clinic with any problems, questions or concerns. We can certainly see the patient much sooner if necessary.  Disclaimer: This note was dictated with voice recognition software. Similar sounding words can inadvertently be transcribed and may not be corrected upon review.

## 2024-01-17 MED ORDER — MIRABEGRON ER 25 MG PO TB24: 25.0000 mg | ORAL_TABLET | Freq: Every day | ORAL | 6 refills | Status: DC

## 2024-01-22 ENCOUNTER — Encounter: Attending: Registered Nurse | Admitting: Registered Nurse

## 2024-01-22 ENCOUNTER — Encounter: Payer: Self-pay | Admitting: Registered Nurse

## 2024-01-22 VITALS — BP 113/72 | HR 108 | Ht 66.0 in | Wt 189.8 lb

## 2024-01-22 DIAGNOSIS — M25511 Pain in right shoulder: Secondary | ICD-10-CM | POA: Diagnosis not present

## 2024-01-22 DIAGNOSIS — G894 Chronic pain syndrome: Secondary | ICD-10-CM | POA: Diagnosis not present

## 2024-01-22 DIAGNOSIS — M4802 Spinal stenosis, cervical region: Secondary | ICD-10-CM | POA: Diagnosis not present

## 2024-01-22 DIAGNOSIS — G8929 Other chronic pain: Secondary | ICD-10-CM | POA: Diagnosis not present

## 2024-01-22 DIAGNOSIS — R000 Tachycardia, unspecified: Secondary | ICD-10-CM | POA: Diagnosis not present

## 2024-01-22 DIAGNOSIS — Z79891 Long term (current) use of opiate analgesic: Secondary | ICD-10-CM | POA: Diagnosis not present

## 2024-01-22 DIAGNOSIS — Z5181 Encounter for therapeutic drug level monitoring: Secondary | ICD-10-CM | POA: Insufficient documentation

## 2024-01-22 DIAGNOSIS — M5412 Radiculopathy, cervical region: Secondary | ICD-10-CM | POA: Insufficient documentation

## 2024-01-22 DIAGNOSIS — M542 Cervicalgia: Secondary | ICD-10-CM | POA: Insufficient documentation

## 2024-01-22 MED ORDER — ACETAMINOPHEN-CODEINE 300-60 MG PO TABS: 1.0000 | ORAL_TABLET | Freq: Every day | ORAL | 2 refills | Status: DC | PRN

## 2024-01-22 NOTE — Progress Notes (Unsigned)
 Subjective:    Patient ID: Susan Cortez, female    DOB: Sep 11, 1960, 63 y.o.   MRN: [id number]  HPI: Susan Cortez is a 63 y.o. female who returns for follow up appointment for chronic pain and medication refill. states *** pain is located in  ***. rates pain ***. current exercise regime is walking and performing stretching exercises.  Ms. Hoeschen Morphine  equivalent is *** MME.       Pain Inventory Average Pain 7 Pain Right Now 6 My pain is constant, sharp, burning, stabbing, and aching  In the last 24 hours, has pain interfered with the following? General activity 3 Relation with others 0 Enjoyment of life 0 What TIME of day is your pain at its worst? morning , daytime, evening, night, and varies Sleep (in general) Poor  Pain is worse with: walking, bending, standing, and some activites Pain improves with: rest, pacing activities, and medication Relief from Meds: 7  Family History  Problem Relation Age of Onset   Arthritis Mother    Heart disease Mother    Stroke Mother    Diabetes Father    Heart disease Father    Hyperlipidemia Father    Obesity Father    Diabetes Sister    Obesity Sister    Pancreatitis Sister    Liver disease Sister        abscess   Alcohol  abuse Brother    Early death Brother    Kidney disease Brother    Obesity Brother    Heart disease Brother    Obesity Brother    Heart attack Brother    Cancer Paternal Grandmother    Colon cancer Neg Hx    Colon polyps Neg Hx    Esophageal cancer Neg Hx    Rectal cancer Neg Hx    Stomach cancer Neg Hx    Social History   Socioeconomic History   Marital status: Divorced    Spouse name: Not on file   Number of children: Not on file   Years of education: Not on file   Highest education level: Associate degree: academic program  Occupational History   Not on file  Tobacco Use   Smoking status: Every Day    Current packs/day: 1.00    Average packs/day: 1 pack/day for 48.0 years (48.0 ttl  pk-yrs)    Types: Cigarettes   Smokeless tobacco: Never   Tobacco comments:    1 pack smoked per day [facility] 09/19/22  Vaping Use   Vaping status: Former  Substance and Sexual Activity   Alcohol  use: No   Drug use: No   Sexual activity: Not on file    Comment: Tubal Ligation  Other Topics Concern   Not on file  Social History Narrative   Not on file   Social Drivers of Health   Financial Resource Strain: High Risk (10/10/2023)   Overall Financial Resource Strain (CARDIA)    Difficulty of Paying Living Expenses: Hard  Food Insecurity: No Food Insecurity (10/10/2023)   Hunger Vital Sign    Worried About Running Out of Food in the Last Year: Never true    Ran Out of Food in the Last Year: Never true  Transportation Needs: No Transportation Needs (10/10/2023)   PRAPARE - Administrator, Civil Service (Medical): No    Lack of Transportation (Non-Medical): No  Physical Activity: Inactive (10/10/2023)   Exercise Vital Sign    Days of Exercise per Week: 0 days  Minutes of Exercise per Session: 0 min  Stress: No Stress Concern Present (10/10/2023)   [employer] of Occupational Health - Occupational Stress Questionnaire    Feeling of Stress : Only a little  Social Connections: Moderately Isolated (10/10/2023)   Social Connection and Isolation Panel [NHANES]    Frequency of Communication with Friends and Family: More than three times a week    Frequency of Social Gatherings with Friends and Family: Once a week    Attends Religious Services: 1 to 4 times per year    Active Member of [employer] or Organizations: No    Attends Banker Meetings: Never    Marital Status: Divorced   Past Surgical History:  Procedure Laterality Date   CESAREAN SECTION     X 3   COLONOSCOPY  2013, 2016, 2019   hx colon polyps   DILATION AND CURETTAGE OF UTERUS     FUDUCIAL PLACEMENT Right 10/18/2020   Procedure: PLACEMENT OF FUDUCIAL;  Surgeon: Denson Flake, MD;  Location: [facility] OR;   Service: Thoracic;  Laterality: Right;   IR IMAGING GUIDED PORT INSERTION  11/29/2020   IR REMOVAL TUN ACCESS W/ PORT W/O FL MOD SED  05/08/2022   SHOULDER ARTHROSCOPY WITH OPEN ROTATOR CUFF REPAIR Right 05/2009   TONSILLECTOMY  1977   TUBAL LIGATION     VIDEO BRONCHOSCOPY WITH ENDOBRONCHIAL NAVIGATION N/A 10/18/2020   Procedure: VIDEO BRONCHOSCOPY WITH ENDOBRONCHIAL NAVIGATION;  Surgeon: Denson Flake, MD;  Location: [facility];  Service: Thoracic;  Laterality: N/A;   VIDEO BRONCHOSCOPY WITH ENDOBRONCHIAL ULTRASOUND N/A 10/18/2020   Procedure: VIDEO BRONCHOSCOPY WITH ENDOBRONCHIAL ULTRASOUND;  Surgeon: Denson Flake, MD;  Location: [facility];  Service: Thoracic;  Laterality: N/A;   WISDOM TOOTH EXTRACTION     Past Surgical History:  Procedure Laterality Date   CESAREAN SECTION     X 3   COLONOSCOPY  2013, 2016, 2019   hx colon polyps   DILATION AND CURETTAGE OF UTERUS     FUDUCIAL PLACEMENT Right 10/18/2020   Procedure: PLACEMENT OF FUDUCIAL;  Surgeon: Denson Flake, MD;  [facility]: [facility];  Service: Thoracic;  Laterality: Right;   IR IMAGING GUIDED PORT INSERTION  11/29/2020   IR REMOVAL TUN ACCESS W/ PORT W/O FL MOD SED  05/08/2022   SHOULDER ARTHROSCOPY WITH OPEN ROTATOR CUFF REPAIR Right 05/2009   TONSILLECTOMY  1977   TUBAL LIGATION     VIDEO BRONCHOSCOPY WITH ENDOBRONCHIAL NAVIGATION N/A 10/18/2020   Procedure: VIDEO BRONCHOSCOPY WITH ENDOBRONCHIAL NAVIGATION;  Surgeon: Denson Flake, MD;  [facility]: [facility];  Service: Thoracic;  Laterality: N/A;   VIDEO BRONCHOSCOPY WITH ENDOBRONCHIAL ULTRASOUND N/A 10/18/2020   Procedure: VIDEO BRONCHOSCOPY WITH ENDOBRONCHIAL ULTRASOUND;  Surgeon: Denson Flake, MD;  Location: [facility];  Service: Thoracic;  Laterality: N/A;   WISDOM TOOTH EXTRACTION     Past Medical History:  Diagnosis Date   Allergy    Arthritis    Basal cell carcinoma 2018   Removed from Right neck per patient    COPD (chronic obstructive pulmonary disease) (HCC) 06/2019    Diverticulosis 2013   Dyslipidemia 03/01/2018   GERD (gastroesophageal reflux disease) 04/2021   HLD (hyperlipidemia)    Hx of adenomatous colonic polyps 2013   OAB (overactive bladder)    Pre-diabetes    Small cell lung cancer (HCC) 11/09/2020   Stroke (HCC) 2022   BP 113/72 (BP Location: Left Arm, Patient Position: Sitting)   Pulse (!) 117   Ht  5\' 6"  (1.676 m)   Wt 189 lb 12.8 oz (86.1 kg)   LMP  (LMP Unknown)   SpO2 91%   BMI 30.63 kg/m   Opioid Risk Score:   Fall Risk Score:  `1  Depression screen [facility] 2/9     01/22/2024   10:37 AM 06/24/2023   10:18 AM 04/25/2023    1:10 PM 01/23/2023   10:46 AM 10/26/2022   10:35 AM 09/21/2022    3:19 PM 09/07/2022    9:26 AM  Depression screen PHQ 2/9  Decreased Interest 0 0 0 0 0 0 0  Down, Depressed, Hopeless 0 0 0 0 0 0 0  PHQ - 2 Score 0 0 0 0 0 0 0     Review of Systems     Objective:   Physical Exam        Assessment & Plan:

## 2024-01-25 LAB — TOXASSURE SELECT,+ANTIDEPR,UR

## 2024-01-27 ENCOUNTER — Ambulatory Visit
Admission: RE | Admit: 2024-01-27 | Discharge: 2024-01-27 | Disposition: A | Discharge status: Home / Self Care | Payer: 59 | Source: Ambulatory Visit | Attending: Radiation Oncology | Admitting: Radiation Oncology

## 2024-01-27 DIAGNOSIS — C7931 Secondary malignant neoplasm of brain: Secondary | ICD-10-CM

## 2024-01-27 DIAGNOSIS — C3490 Malignant neoplasm of unspecified part of unspecified bronchus or lung: Secondary | ICD-10-CM | POA: Diagnosis not present

## 2024-01-27 MED ORDER — GADOPICLENOL 0.5 MMOL/ML IV SOLN
8.5000 mL | Freq: Once | INTRAVENOUS | Status: AC | PRN
  Administered 2024-01-27: 8.5 mL via INTRAVENOUS

## 2024-01-29 ENCOUNTER — Other Ambulatory Visit: Payer: 59

## 2024-01-31 ENCOUNTER — Encounter: Payer: Self-pay | Admitting: Radiation Oncology

## 2024-01-31 NOTE — Progress Notes (Addendum)
 Completed on 01/31/2024  Telephone nursing appointment for review of most recent MRI results. I verified patient's identity x2 and began nursing interview.    Patient states issues as follows...  Pain: 8/10 Central, lower back, and RT shoulder  Fatigue: Moderate Weakness or loss of control of the extremities: Denies  Headache: Occasional cluster headaches x10 secs in the PM 8/10.  Seizure: Denies Vision: Denies Memory: Denies Confusion: Denies Skin: Denies Cardiac: Denies Lungs: Denies Dexamethasone : Denies   Patient denies any other related issues at this time.   Meaningful use complete.   Patient aware of their telephone appointment w/ Allana Ishikawa PA-C. I left my extension [phone] in case patient needs anything. Patient verbalized understanding. This concludes the nursing interview.   Patient preferred phone # [phone]    Susan Bodo, LPN

## 2024-02-03 ENCOUNTER — Inpatient Hospital Stay: Payer: 59

## 2024-02-03 ENCOUNTER — Ambulatory Visit
Admission: RE | Admit: 2024-02-03 | Discharge: 2024-02-03 | Disposition: A | Discharge status: Home / Self Care | Payer: 59 | Source: Ambulatory Visit | Attending: Radiation Oncology | Admitting: Radiation Oncology

## 2024-02-03 VITALS — Ht 68.0 in | Wt 190.0 lb

## 2024-02-03 DIAGNOSIS — C3411 Malignant neoplasm of upper lobe, right bronchus or lung: Secondary | ICD-10-CM

## 2024-02-03 DIAGNOSIS — F17210 Nicotine dependence, cigarettes, uncomplicated: Secondary | ICD-10-CM | POA: Diagnosis not present

## 2024-02-03 NOTE — Progress Notes (Addendum)
 Radiation Oncology         [phone] ________________________________  Outpatient Follow Up - Conducted via telephone at patient request.  I spoke with the patient to conduct this  visit via telephone. The patient was notified in advance and was offered an in person or telemedicine meeting to allow for face to face communication but instead preferred to proceed with a telephone visit. ________________________________  Name: Susan Cortez        MRN: [id number]  Date of Service: 02/03/2024 DOB: 01-01-1961  [id number], Skipper Dumas, MD  Copland, Skipper Dumas, MD     REFERRING PHYSICIAN: Copland, Skipper Dumas, MD   DIAGNOSIS: The encounter diagnosis was Small cell carcinoma of upper lobe of right lung (HCC).   HISTORY OF PRESENT ILLNESS: Susan Cortez is a 63 y.o. female with a history of limited stage small cell carcinoma of the right upper lobe involving suprahilar nodule/adenopathy and right paractracheal adenopathy. A biopsy and cytology on 10/18/20 showed malignant cells in the right upper lobe brushings and fine-needle aspirate, atypical cells were seen in the 11 R node by fine-needle aspiration and malignant cells consistent with small cell carcinoma were seen in the level 4R and 10 R nodes as well.  She received chemoradiation which she completed on  01/04/21.   She was counseled on prophylactic cranial irradiation and prior to treatment had a negative MRI of the brain. She did have a lesion in the calvarium that was originally read as possibly metastatic, but after further discussion with neuroradiology, this was felt to be a benign appearing hemangioma and over time has been stable. She received whole brain radiation which she completed on 03/28/21. She completed Namenda  in January 2023. She continues in surveillance with Dr. Marguerita Shih.  Her most recent MRI of the brain on 01/27/24 showed no evidence of  metastatic disease.  Again, there is stability in the known left parietal skull lesion and  evidence of prior right cerebellar hemisphere infarcts which again are read as stable.  She is contacted by phone today to review the MRI results. Of note since her last visit she had an EGD and colonoscopy in March 2025 with Dr. General Kenner and had benign polyps in the colon and several benign polyps in the gastric/esophageal tissue felt to be reactive. She was started on PPI therapy and a barium evaluation with SLP was negative for focal findings, but did not explain her dysphagia to liquids.   REVIEW OF SYSTEMS: On review of systems the patient reports she is doing much better with swallowing since being on prilosec and taking mucinex  for her post nasal drip. She feels like her dysphagia has completely resolved, and that coughing in the morning has lessened significantly. She reports feeling quite well and intermittently has back pain which comes and goes depending on activity. She feels like her hearing is stable overall and knows she has some sensorineural loss but plans to follow up as needed with her ENT team. She's currently up in [place]  visiting with family for the week. No other complaints are verbalized.    PREVIOUS RADIATION THERAPY:   03/15/2021 through 03/28/2021 Site Technique Total Dose (Gy) Dose per Fx (Gy) Completed Fx Beam Energies  Brain:  Prophylactic Whole Brain Complex 25/25 2.5 10/10 6X   11/21/2020 through 01/04/2021 Site Technique Total Dose (Gy) Dose per Fx (Gy) Completed Fx Beam Energies  Lung, Right: Lung_Rt 3D 60/60 2 30/30 6X, 10X  Lung, Right: Lung_Rt_Bst 3D 6/6 2 3/3 6X, 10X  PAST MEDICAL HISTORY:  Past Medical History:  Diagnosis Date   Allergy    Arthritis    Basal cell carcinoma 2018   Removed from Right neck per patient    COPD (chronic obstructive pulmonary disease) (HCC) 06/2019   Diverticulosis 2013   Dyslipidemia 03/01/2018   GERD (gastroesophageal reflux disease) 04/2021   HLD (hyperlipidemia)    Hx of adenomatous colonic polyps 2013   OAB  (overactive bladder)    Pre-diabetes    Small cell lung cancer (HCC) 11/09/2020   Stroke (HCC) 2022       PAST SURGICAL HISTORY: Past Surgical History:  Procedure Laterality Date   CESAREAN SECTION     X 3   COLONOSCOPY  2013, 2016, 2019   hx colon polyps   DILATION AND CURETTAGE OF UTERUS     FUDUCIAL PLACEMENT Right 10/18/2020   Procedure: PLACEMENT OF FUDUCIAL;  Surgeon: Denson Flake, MD;  Location: [facility] OR;  Service: Thoracic;  Laterality: Right;   IR IMAGING GUIDED PORT INSERTION  11/29/2020   IR REMOVAL TUN ACCESS W/ PORT W/O FL MOD SED  05/08/2022   SHOULDER ARTHROSCOPY WITH OPEN ROTATOR CUFF REPAIR Right 05/2009   TONSILLECTOMY  1977   TUBAL LIGATION     VIDEO BRONCHOSCOPY WITH ENDOBRONCHIAL NAVIGATION N/A 10/18/2020   Procedure: VIDEO BRONCHOSCOPY WITH ENDOBRONCHIAL NAVIGATION;  Surgeon: Denson Flake, MD;  Location: [facility] OR;  Service: Thoracic;  Laterality: N/A;   VIDEO BRONCHOSCOPY WITH ENDOBRONCHIAL ULTRASOUND N/A 10/18/2020   Procedure: VIDEO BRONCHOSCOPY WITH ENDOBRONCHIAL ULTRASOUND;  Surgeon: Denson Flake, MD;  Location: [facility] OR;  Service: Thoracic;  Laterality: N/A;   WISDOM TOOTH EXTRACTION       FAMILY HISTORY:  Family History  Problem Relation Age of Onset   Arthritis Mother    Heart disease Mother    Stroke Mother    Diabetes Father    Heart disease Father    Hyperlipidemia Father    Obesity Father    Diabetes Sister    Obesity Sister    Pancreatitis Sister    Liver disease Sister        abscess   Alcohol  abuse Brother    Early death Brother    Kidney disease Brother    Obesity Brother    Heart disease Brother    Obesity Brother    Heart attack Brother    Cancer Paternal Grandmother    Colon cancer Neg Hx    Colon polyps Neg Hx    Esophageal cancer Neg Hx    Rectal cancer Neg Hx    Stomach cancer Neg Hx      SOCIAL HISTORY:  reports that she has been smoking cigarettes. She has a 48 pack-year smoking history. She has never used smokeless  tobacco. She reports that she does not drink alcohol  and does not use drugs. The patient is divorced and lives in [place]. She is originally from [place]  and enjoys traveling home to [place]  periodically to see friends and family.     ALLERGIES: Lyrica [pregabalin], Butrans  [buprenorphine ], Chantix [varenicline], Diclofenac , Doxycycline , Meloxicam, Tolmetin, and Nsaids   MEDICATIONS:  Current Outpatient Medications  Medication Sig Dispense Refill   acetaminophen -codeine  (TYLENOL  #4) 300-60 MG tablet Take 1 tablet by mouth daily as needed for moderate pain (pain score 4-6). 30 tablet 2   CALCIUM PO Take 650 mg by mouth daily.     Cholecalciferol (VITAMIN D3) 125 MCG (5000 UT) CAPS Take 5,000 Units by mouth daily.  Cyanocobalamin  (B-12) 100 MCG TABS      fluocinonide (LIDEX) 0.05 % external solution Apply 1 Application topically as needed.     guaiFENesin  (MUCINEX ) 600 MG 12 hr tablet Take 2 tablets (1,200 mg total) by mouth 2 (two) times daily. 30 tablet 0   linaclotide  (LINZESS ) 145 MCG CAPS capsule Take 1 capsule (145 mcg total) by mouth daily before breakfast. Take 30-60 minutes before breakfast 90 capsule 3   LORazepam  (ATIVAN ) 0.5 MG tablet TAKE 1 TABLET 30 MINUTES PRIOR TO MRI OR RADIATION 5 tablet 0   lovastatin  (MEVACOR ) 20 MG tablet TAKE 1 TABLET BY MOUTH EVERYDAY AT BEDTIME 90 tablet 1   metoprolol  succinate (TOPROL -XL) 25 MG 24 hr tablet TAKE 1/2 TABLET BY MOUTH DAILY 45 tablet 3   mirabegron  ER (MYRBETRIQ ) 25 MG TB24 tablet Take 1 tablet (25 mg total) by mouth daily. 30 tablet 6   Multiple Vitamins-Minerals (MULTIVITAMIN ADULT, MINERALS,) TABS      omeprazole  (PRILOSEC) 20 MG capsule Take 1 capsule (20 mg total) by mouth daily. 90 capsule 3   No current facility-administered medications for this encounter.        PHYSICAL EXAM:  Unable to assess  due to encounter type.    ECOG = 1  0 - Asymptomatic (Fully active, able to carry on all predisease activities without  restriction)  1 - Symptomatic but completely ambulatory (Restricted in physically strenuous activity but ambulatory and able to carry out work of a light or sedentary nature. For example, light housework, office work)  2 - Symptomatic, <50% in bed during the day (Ambulatory and capable of all self care but unable to carry out any work activities. Up and about more than 50% of waking hours)  3 - Symptomatic, >50% in bed, but not bedbound (Capable of only limited self-care, confined to bed or chair 50% or more of waking hours)  4 - Bedbound (Completely disabled. Cannot carry on any self-care. Totally confined to bed or chair)  5 - Death   Susan Cortez, Susan Cortez, Susan Cortez, et al. [phone]). "Toxicity and response criteria of the [facility]". Am. Susan Cortez. Oncol. 5 (6): 649-55    LABORATORY DATA:  Lab Results  Component Value Date   WBC 8.8 12/16/2023   HGB 15.4 (H) 12/16/2023   HCT 45.2 12/16/2023   MCV 92.1 12/16/2023   PLT 262 12/16/2023   Lab Results  Component Value Date   NA 137 12/16/2023   K 4.2 12/16/2023   CL 102 12/16/2023   CO2 28 12/16/2023   Lab Results  Component Value Date   ALT 21 12/16/2023   AST 24 12/16/2023   ALKPHOS 51 12/16/2023   BILITOT 0.5 12/16/2023      RADIOGRAPHY: MR Brain W Wo Contrast Result Date: 01/31/2024 CLINICAL DATA:  Small cell lung cancer.  Brain metastases. EXAM: MRI HEAD WITHOUT AND WITH CONTRAST TECHNIQUE: Multiplanar, multiecho pulse sequences of the brain and surrounding structures were obtained without and with intravenous contrast. CONTRAST:  8.5 mL Vueway  COMPARISON:  MR head without and with contrast 07/29/2023 and 01/24/2023. FINDINGS: Brain: No pathologic enhancement of the brain or meninges is present to suggest metastatic disease. Remote lacunar infarcts are again noted within the cerebellum bilaterally. Periventricular and subcortical white matter changes are mildly advanced for age, stable. Deep brain nuclei  are within normal limits. The ventricles are of normal size. No significant extraaxial fluid collection is present. The internal auditory canals are within normal limits. The  brainstem and cerebellum are otherwise within normal limits. Vascular: Flow is present in the major intracranial arteries. Skull and upper cervical spine: Osseous lesion in the left parietal lobe is stable no new osseous lesions are present. Craniocervical junction is normal. The upper cervical spine is normal. Sinuses/Orbits: Minimal mastoid effusions are present bilaterally. No obstructing nasopharyngeal lesion is present. The paranasal sinuses and mastoid air cells are otherwise clear. The globes and orbits are within normal limits. IMPRESSION: 1. Stable MRI appearance of the brain. No evidence for metastatic disease to the brain or meninges. 2. Stable osseous lesion in the left parietal lobe. 3. Stable remote lacunar infarcts of the cerebellum bilaterally. 4. Stable periventricular and subcortical white matter changes. This likely reflects the sequela of chronic microvascular ischemia. Electronically Signed   By: Susan Cortez M.D.   On: 01/31/2024 10:21        IMPRESSION/PLAN: 1. Limited Stage Small Cell Carcinoma of the RUL. The patient is doing well and radiographically without disease. She will follow up with Dr. Marguerita Shih in surveillance and continue with MRIs of the brain every 6 months.  2. Left Parietal Calvarial Hemangioma. This finding continues to be stable and is followed expectantly and felt to be benign. 3. Sensorineural hearing loss and temporal headaches. Her symptoms have stabilized and we will follow this expectantly as well.  4.   History of colon polyps and dysphagia to liquids.  She will have ongoing colonoscopies with Dr. General Kenner every 5 years per his notes. Her barium evaluation did not explain her dyphagia to liquids, but the SLP team thought this was likely due to GERD and since going on PPI therapy  this has resolved. This will be followed expectantly. 5. Claustrophobia. The patient takes .5-1 mg of Ativan  prior to MRI scans and a new prescription will be sent in closer to the time of her next MRI in December.  This encounter was conducted via telephone.  The patient has provided two factor identification and has given verbal consent for this type of encounter and has been advised to only accept a meeting of this type in a secure network environment. The time spent during this encounter was 35 minutes including preparation, discussion, and coordination of the patient's care. The attendants for this meeting include  Susan Cortez  and Susan Cortez.  During the encounter,  Susan Cortez was located at [facility] in the radiation oncology department Susan Cortez was located in [place]  in her car helping to teach her grandson how to drive.       Susan Cortez, [facility]   **Disclaimer: This note was dictated with voice recognition software. Similar sounding words can inadvertently be transcribed and this note may contain transcription errors which may not have been corrected upon publication of note.**

## 2024-02-06 ENCOUNTER — Other Ambulatory Visit (HOSPITAL_BASED_OUTPATIENT_CLINIC_OR_DEPARTMENT_OTHER): Payer: Self-pay | Admitting: Family Medicine

## 2024-02-06 DIAGNOSIS — Z1231 Encounter for screening mammogram for malignant neoplasm of breast: Secondary | ICD-10-CM

## 2024-02-07 ENCOUNTER — Ambulatory Visit (INDEPENDENT_AMBULATORY_CARE_PROVIDER_SITE_OTHER): Admitting: Physician Assistant

## 2024-02-07 ENCOUNTER — Encounter: Payer: Self-pay | Admitting: Physician Assistant

## 2024-02-07 VITALS — BP 126/80 | HR 87 | Temp 98.0°F | Ht 68.0 in | Wt 196.0 lb

## 2024-02-07 DIAGNOSIS — H1013 Acute atopic conjunctivitis, bilateral: Secondary | ICD-10-CM

## 2024-02-07 DIAGNOSIS — R600 Localized edema: Secondary | ICD-10-CM | POA: Diagnosis not present

## 2024-02-07 MED ORDER — AZELASTINE HCL 0.05 % OP SOLN: 1.0000 [drp] | Freq: Two times a day (BID) | OPHTHALMIC | 1 refills | Status: DC

## 2024-02-07 MED ORDER — PREDNISONE 20 MG PO TABS: 20.0000 mg | ORAL_TABLET | Freq: Every day | ORAL | 0 refills | Status: DC

## 2024-02-07 NOTE — Progress Notes (Addendum)
 Established patient visit   Patient: Susan Cortez   DOB: 10/31/1960   63 y.o. Female  MRN: [id number] Visit Date: 02/07/2024  Today's healthcare provider: Trenton Frock, PA-C   Chief Complaint  Patient presents with   Acute Visit    06/03 eyes started to get really red with oily discharge; started getting worse turning yellow and noticed ankles started swelling Saturday night; dont know if due to standing most of the day Sat   Sore Throat    Off and on for the last month; head feels cloudy   Subjective     Discussed the use of AI scribe software for clinical note transcription with the patient, who gave verbal consent to proceed.  History of Present Illness   Susan Cortez is a 63 year old female who presents with yellowing of the eyes and swollen feet.  She has yellowing of the eyes, first noticed a few days ago, with her daughter also observing a yellow and red appearance. She reports tearing, clear discharge, itching b/l eyes. She has used Systane and Pataday eye drops without relief. Reports new swelling b/l feet. Reports being on her feet and sitting more recently, but that this swelling is new.   She experiences a persistent sore throat over the past couple of weeks, with no change in her chronic cough of two and a half years. There is no wheezing or increased mucus production.        Medications: Outpatient Medications Prior to Visit  Medication Sig   acetaminophen -codeine  (TYLENOL  #4) 300-60 MG tablet Take 1 tablet by mouth daily as needed for moderate pain (pain score 4-6).   CALCIUM PO Take 650 mg by mouth daily.   Cholecalciferol (VITAMIN D3) 125 MCG (5000 UT) CAPS Take 5,000 Units by mouth daily.   Cyanocobalamin  (B-12) 100 MCG TABS    fluocinonide (LIDEX) 0.05 % external solution Apply 1 Application topically as needed.   guaiFENesin  (MUCINEX ) 600 MG 12 hr tablet Take 2 tablets (1,200 mg total) by mouth 2 (two) times daily.   linaclotide  (LINZESS )  145 MCG CAPS capsule Take 1 capsule (145 mcg total) by mouth daily before breakfast. Take 30-60 minutes before breakfast   LORazepam  (ATIVAN ) 0.5 MG tablet TAKE 1 TABLET 30 MINUTES PRIOR TO MRI OR RADIATION   lovastatin  (MEVACOR ) 20 MG tablet TAKE 1 TABLET BY MOUTH EVERYDAY AT BEDTIME   metoprolol  succinate (TOPROL -XL) 25 MG 24 hr tablet TAKE 1/2 TABLET BY MOUTH DAILY   mirabegron  ER (MYRBETRIQ ) 25 MG TB24 tablet Take 1 tablet (25 mg total) by mouth daily.   Multiple Vitamins-Minerals (MULTIVITAMIN ADULT, MINERALS,) TABS    omeprazole  (PRILOSEC) 20 MG capsule Take 1 capsule (20 mg total) by mouth daily.   No facility-administered medications prior to visit.    Review of Systems  Constitutional:  Negative for fatigue and fever.  Eyes:  Positive for discharge, redness and itching.  Respiratory:  Positive for cough. Negative for shortness of breath.   Cardiovascular:  Positive for leg swelling. Negative for chest pain.  Gastrointestinal:  Negative for abdominal pain.  Neurological:  Negative for dizziness and headaches.       Objective    BP 126/80   Pulse 87   Temp 98 F (36.7 C)   Ht 5' 8 (1.727 m)   Wt 196 lb (88.9 kg)   LMP  (LMP Unknown)   SpO2 98%   BMI 29.80 kg/m    Physical Exam Constitutional:  General: She is awake.     Appearance: She is well-developed.  HENT:     Head: Normocephalic.     Mouth/Throat:     Pharynx: No oropharyngeal exudate or posterior oropharyngeal erythema.   Eyes:     Conjunctiva/sclera: Conjunctivae normal.     Comments: Right eye with mild injection, tearing. Left eye erythematous conjunctiva, tearing.  Mild erythema below b/l eyes.  No icterus appreciated   Cardiovascular:     Rate and Rhythm: Normal rate and regular rhythm.     Heart sounds: Normal heart sounds.  Pulmonary:     Effort: Pulmonary effort is normal.     Breath sounds: Normal breath sounds.  Feet:     Comments: B/l feet with nonpitting edema to  ankles  Skin:    General: Skin is warm.   Neurological:     Mental Status: She is alert and oriented to person, place, and time.   Psychiatric:        Attention and Perception: Attention normal.        Mood and Affect: Mood normal.        Speech: Speech normal.        Behavior: Behavior is cooperative.     No results found for any visits on 02/07/24.  Assessment & Plan    Localized edema Mild edema b/l feet. Will check labs, advised elevation -     CBC with Differential/Platelet -     Comprehensive metabolic panel with GFR -     Brain natriuretic peptide  Allergic conjunctivitis of both eyes Recommending otc antihistamines, rx prednisone  20 mg x 5 days  Rx azelastine  eye drops . If expensive, recommending zaditor otc.   -     CBC with Differential/Platelet -     Comprehensive metabolic panel with GFR -     predniSONE ; Take 1 tablet (20 mg total) by mouth daily with breakfast.  Dispense: 5 tablet; Refill: 0 -     Azelastine  HCl; Place 1 drop into both eyes 2 (two) times daily.  Dispense: 6 mL; Refill: 1  Return if symptoms worsen or fail to improve.       Trenton Frock, PA-C  [facility] Primary Care at [facility] [phone] (phone) [phone] (fax)  [facility]

## 2024-02-08 LAB — COMPREHENSIVE METABOLIC PANEL WITH GFR
AG Ratio: 2.2 (calc) (ref 1.0–2.5)
ALT: 17 U/L (ref 6–29)
AST: 21 U/L (ref 10–35)
Albumin: 4.1 g/dL (ref 3.6–5.1)
Alkaline phosphatase (APISO): 55 U/L (ref 37–153)
BUN: 7 mg/dL (ref 7–25)
CO2: 24 mmol/L (ref 20–32)
Calcium: 8.8 mg/dL (ref 8.6–10.4)
Chloride: 106 mmol/L (ref 98–110)
Creat: 0.7 mg/dL (ref 0.50–1.05)
Globulin: 1.9 g/dL (ref 1.9–3.7)
Glucose, Bld: 87 mg/dL (ref 65–99)
Potassium: 4 mmol/L (ref 3.5–5.3)
Sodium: 140 mmol/L (ref 135–146)
Total Bilirubin: 0.3 mg/dL (ref 0.2–1.2)
Total Protein: 6 g/dL — ABNORMAL LOW (ref 6.1–8.1)
eGFR: 97 mL/min/{1.73_m2} (ref >=60)

## 2024-02-08 LAB — CBC WITH DIFFERENTIAL/PLATELET
Absolute Lymphocytes: 1650 {cells}/uL (ref 850–3900)
Absolute Monocytes: 577 {cells}/uL (ref 200–950)
Basophils Absolute: 59 {cells}/uL (ref 0–200)
Basophils Relative: 0.8 %
Eosinophils Absolute: 118 {cells}/uL (ref 15–500)
Eosinophils Relative: 1.6 %
HCT: 37.5 % (ref 35.0–45.0)
Hemoglobin: 12.2 g/dL (ref 11.7–15.5)
MCH: 31 pg (ref 27.0–33.0)
MCHC: 32.5 g/dL (ref 32.0–36.0)
MCV: 95.4 fL (ref 80.0–100.0)
MPV: 10.6 fL (ref 7.5–12.5)
Monocytes Relative: 7.8 %
Neutro Abs: 4995 {cells}/uL (ref 1500–7800)
Neutrophils Relative %: 67.5 %
Platelets: 310 10*3/uL (ref 140–400)
RBC: 3.93 10*6/uL (ref 3.80–5.10)
RDW: 13.8 % (ref 11.0–15.0)
Total Lymphocyte: 22.3 %
WBC: 7.4 10*3/uL (ref 3.8–10.8)

## 2024-02-08 LAB — BRAIN NATRIURETIC PEPTIDE: Brain Natriuretic Peptide: 164 pg/mL — ABNORMAL HIGH (ref <100)

## 2024-02-10 ENCOUNTER — Ambulatory Visit: Payer: Self-pay | Admitting: Physician Assistant

## 2024-02-12 ENCOUNTER — Ambulatory Visit (INDEPENDENT_AMBULATORY_CARE_PROVIDER_SITE_OTHER): Admitting: Gastroenterology

## 2024-02-12 VITALS — BP 122/76 | HR 94 | Ht 68.0 in | Wt 193.5 lb

## 2024-02-12 DIAGNOSIS — Z79899 Other long term (current) drug therapy: Secondary | ICD-10-CM | POA: Diagnosis not present

## 2024-02-12 DIAGNOSIS — K5909 Other constipation: Secondary | ICD-10-CM | POA: Diagnosis not present

## 2024-02-12 DIAGNOSIS — K219 Gastro-esophageal reflux disease without esophagitis: Secondary | ICD-10-CM | POA: Diagnosis not present

## 2024-02-12 DIAGNOSIS — R1310 Dysphagia, unspecified: Secondary | ICD-10-CM

## 2024-02-12 DIAGNOSIS — Z860100 Personal history of colon polyps, unspecified: Secondary | ICD-10-CM

## 2024-02-12 MED ORDER — OMEPRAZOLE 20 MG PO CPDR: 20.0000 mg | DELAYED_RELEASE_CAPSULE | Freq: Every day | ORAL | 3 refills | Status: AC

## 2024-02-12 MED ORDER — LINACLOTIDE 145 MCG PO CAPS: 145.0000 ug | ORAL_CAPSULE | Freq: Every day | ORAL | 3 refills | Status: AC

## 2024-02-12 NOTE — Patient Instructions (Signed)
 We have sent the following medications to your pharmacy for you to pick up at your convenience: Omeprazole  20 mg: Take once daily Linzess  145 mcg: Take once daily 30 MINUTES BEFORE A MEAL  Please follow up in 1 year.  Thank you for entrusting me with your care and for choosing [facility], Dr. Alvester Johnson    If your blood pressure at your visit was 140/90 or greater, please contact your primary care physician to follow up on this. ______________________________________________________  If you are age 64 or older, your body mass index should be between 23-30. Your Body mass index is 29.42 kg/m. If this is out of the aforementioned range listed, please consider follow up with your Primary Care Provider.  If you are age 71 or younger, your body mass index should be between 19-25. Your Body mass index is 29.42 kg/m. If this is out of the aformentioned range listed, please consider follow up with your Primary Care Provider.  ________________________________________________________  The [facility] providers would like to encourage you to use MYCHART to communicate with providers for non-urgent requests or questions.  Due to long hold times on the telephone, sending your provider a message by [facility] may be a faster and more efficient way to get a response.  Please allow 48 business hours for a response.  Please remember that this is for non-urgent requests.  _______________________________________________________  Due to recent changes in healthcare laws, you may see the results of your imaging and laboratory studies on MyChart before your provider has had a chance to review them.  We understand that in some cases there may be results that are confusing or concerning to you. Not all laboratory results come back in the same time frame and the provider may be waiting for multiple results in order to interpret others.  Please give us  48 hours in order for your provider to thoroughly  review all the results before contacting the office for clarification of your results.

## 2024-02-12 NOTE — Progress Notes (Signed)
 HPI :  63 year old female here for follow-up visit for dysphagia, GERD, constipation.  Recall she has had some history of dysphagia dating back to 2022.  She has had a history of squamous cell lung cancer, status post radiation and chemotherapy as well as prophylactic brain irradiation.  She was having problems swallowing liquids more so than solids.  She underwent an EGD and colonoscopy with me on March 13.  She had a 3 cm hiatal hernia with LA grade a esophagitis.  No focal stenosis or stricture appreciated.  She had benign polypoid lesion in the cardia which was biopsied and benign.  Mild inflammatory changes in the stomach, biopsies negative for H. pylori.  Benign polyps removed from the duodenal bulb.  I subsequently had her evaluated by speech pathology with the modified barium swallow and they did not find any significant abnormalities.  We have placed her on omeprazole  20 mg daily for esophagitis, she is not having any reflux symptoms of bother her currently.  She states her symptoms have improved over time and she is doing better with her swallowing.  Symptoms are pretty mild at this point and in general she is doing much better.  Recall she was at baseline having a bowel movement once every 2 weeks or so and had severe constipation.  We have since placed her on Linzess  145 mg daily and she is having a bowel movement every 3 days.  No hard stools, no blood in the stools.  She is doing much better in this regard.  We performed a surveillance colonoscopy on March 13 as well.  A few small polyps removed, recommended a repeat colonoscopy in 5 years.   Previous workup: February 2022 colonoscopy -- Sigmoid diverticulosis.  A single small angiodysplastic lesion in the ascending colon.  Two flat polyps ranging from 3 to 4 mm in size removed from the ascending colon.  A 4 mm sessile polyp was removed from the descending colon.  A 4 to 5 mm polyp, sessile, was removed from the rectum.  Exam  otherwise normal -- Based on pathology patient was advised to have a follow-up colonoscopy in 3 to 5 years.   1. Surgical [P], colon, rectum and descending, polyp (2) - TUBULAR ADENOMA WITHOUT HIGH GRADE DYSPLASIA (X1). - HYPERPLASTIC POLYP (X1). 2. Surgical [P], colon, ascending, polyp (2) - TUBULAR ADENOMA WITHOUT HIGH GRADE DYSPLASIA (X1). - SESSILE SERRATED POLYP WITHOUT CYTOLOGIC DYSPLASIA   EGD 11/07/23: - Esophagogastric landmarks were identified: the Z-line was found at 37 cm, the gastroesophageal junction was found at 37 cm and the upper extent of the gastric folds was found at 40 cm from the incisors. Findings: - A sliding 3 cm hiatal hernia was present. Not initially apparent until patient had some coughing under anesthesia. - LA Grade A esophagitis was found at the GEJ. - A single area of ectopic gastric mucosa was found in the upper third of the esophagus. - The exam of the esophagus was otherwise normal. No focal stenosis / stricture appreciated. - A single sessile polypoid area was found at the gastroesophageal junction. Initially seen on retroflexed extending from the GEJ, but then within the hernia sac and seen better in forward view upon withdrawal. I suspect inflammatory in etiology in the setting of reflux. Biopsies were taken with a cold forceps for histology. - Patchy mild inflammation characterized by erythema and granularity was found in the gastric antrum. Biopsies were taken with a cold forceps for Helicobacter pylori testing. - The exam  of the stomach was otherwise normal. - Two 2 to 4 mm sessile polyps were found in the duodenal bulb. These polyps were removed with a cold snare. Resection and retrieval were complete. - A single 3 mm sessile polyp was found in the second portion of the duodenum. The polyp was removed with a cold snare. Resection and retrieval were complete. - Mucosal changes were found in the duodenal bulb - prominent folds, seemed unlikely to be subepithelial  lesion. Biopsies were taken with a cold forceps for histology. - The exam of the duodenum was otherwise normal.  Colonoscopy 11/07/23: - The perianal and digital rectal examinations were normal. - A 3 mm polyp was found in the ascending colon. The polyp was sessile. The polyp was removed with a cold snare. Resection was complete, but the polyp tissue was not retrieved. - A 3 mm polyp was found in the transverse colon. The polyp was sessile. The polyp was removed with a cold snare. Resection and retrieval were complete. - A 3 mm polyp was found in the recto-sigmoid colon. The polyp was sessile. The polyp was removed with a cold snare. Resection and retrieval were complete. - A few diverticula were found in the sigmoid colon. In the distal sigmoid colon there was restricted mobility and luminal narrowing. A regular pediatric colonoscope could not traverse this area. This was removed and replaced with an ultra slim pediatric colonoscope. This was able to traverse the area much easier without difficultly, however looping occured in the right colon with this colonoscope, making for difficult cecal intubation. - Internal hemorrhoids were found during retroflexion. - The exam was otherwise without abnormality.   FINAL DIAGNOSIS       1. Surgical [P], duodenal polyps :      REACTIVE DUODENAL MUCOSA SHOWING BRUNNER'S GLAND HYPERPLASIA AND EXTENSIVE      GASTRIC METAPLASIA COMPATIBLE WITH PEPTIC DUODENITIS       2. Surgical [P], duodenal bulb prominent nodule :      REACTIVE DUODENAL MUCOSA SHOWING GASTRIC METAPLASIA COMPATIBLE PEPTIC DUODENITIS       3. Surgical [P], gastric :      REACTIVE GASTROPATHY WITH MINIMAL CHRONIC GASTRITIS      NEGATIVE FOR H. PYLORI, INTESTINAL METAPLASIA, DYSPLASIA AND CARCINOMA       4. Surgical [P], GE junction nodule :      MILD CHRONIC GASTRITIS WITH FOVEOLAR HYPERPLASIA      SCANT REACTIVE SQUAMOUS EPITHELIUM WITH EOSINOPHILS COMPATIBLE WITH REFLUX      ESOPHAGITIS       NEGATIVE FOR INTESTINAL METAPLASIA, DYSPLASIA AND CARCINOMA       5. Surgical [P], colon, ascending, recto sigmoid, polyp (2) :      TUBULAR ADENOMA      HYPERPLASTIC POLYP      NEGATIVE FOR HIGH-GRADE DYSPLASIA AND CARCINOMA   Repeat colonoscopy in 5 years  Started omeprazole , modified barium swallow    Modified barium swallow 11/18/23: mild pharyngoesophageal dysphagia as per this MBS. With thin and nectar thick liquids, swallow was initiated at level of the posterior laryngeal surface of the epiglottis and pyriform sinus. Anterior hyoid excursion, laryngeal elevation, epiglottic inversion all appeared complete. Mild amount of residuals observed on tongue base and vallecular sinus s/p initial swallows but cleared with subsequent swallows. Prominent cricopharyngeal bar observed but no significant impact on barium flow through PES and esophagus. 13mm barium tablet taken with thin liquid barium transited through pharynx, PES, esophagus without difficulty or delay. No retrograde movement of barium  or barium stasis observed at or below PES. Patient's symptoms of difficulty swallowing water and pills, resulting in instances of regurgitation of pills and water is not explained by this test. SLP suspects h er symptoms are related to her esophageal dysphagia/GERD.    CT chest 12/16/23: IMPRESSION: Spiculated nodule in the right lower lobe is significantly increased in size from previous now overall dimension of 13 mm. An aggressive lesion is possible. Recommend further workup such as PET-CT scan versus close follow-up CT in 3 months.   Persistent soft tissue nodular thickening at the right lung hilum extension along the right upper lobe bronchi with interstitial thickening, scarring and fibrotic changes. Areas of nodularity with pleural thickening and fiduciary markers are unchanged in appearance from the prior. Again this could relate to evolving posttreatment changes and recommend continued  follow-up.   Persistent areas of wall thickening and ectasia of the esophagus. Please correlate with specific symptoms or prior workup   Aortic Atherosclerosis (ICD10-I70.0) and Emphysema (ICD10-J43.9).   PET Scan 01/02/24: IMPRESSION: Stable appearance to the presumed posttreatment changes in the right upper lobe of the lung with persistent reticular and nodular changes, bronchial wall thickening and a fiduciary marker. No abnormal uptake in this location.   The spiculated nodule in the anterior aspect of the right lower lobe stable in size compared to the recent chest CT and does not show any abnormal uptake. This could be infectious or inflammatory but again there is a differential and would recommend follow up surveillance with CT scan 3 months.     Past Medical History:  Diagnosis Date   Allergy    Arthritis    Basal cell carcinoma 2018   Removed from Right neck per patient    COPD (chronic obstructive pulmonary disease) (HCC) 06/2019   Diverticulosis 2013   Dyslipidemia 03/01/2018   GERD (gastroesophageal reflux disease) 04/2021   HLD (hyperlipidemia)    Hx of adenomatous colonic polyps 2013   OAB (overactive bladder)    Pre-diabetes    Small cell lung cancer (HCC) 11/09/2020   Stroke (HCC) 2022     Past Surgical History:  Procedure Laterality Date   CESAREAN SECTION     X 3   COLONOSCOPY  2013, 2016, 2019   hx colon polyps   DILATION AND CURETTAGE OF UTERUS     FUDUCIAL PLACEMENT Right 10/18/2020   Procedure: PLACEMENT OF FUDUCIAL;  Surgeon: Denson Flake, MD;  Location: [facility] OR;  Service: Thoracic;  Laterality: Right;   IR IMAGING GUIDED PORT INSERTION  11/29/2020   IR REMOVAL TUN ACCESS W/ PORT W/O FL MOD SED  05/08/2022   SHOULDER ARTHROSCOPY WITH OPEN ROTATOR CUFF REPAIR Right 05/2009   TONSILLECTOMY  1977   TUBAL LIGATION     VIDEO BRONCHOSCOPY WITH ENDOBRONCHIAL NAVIGATION N/A 10/18/2020   Procedure: VIDEO BRONCHOSCOPY WITH ENDOBRONCHIAL NAVIGATION;   Surgeon: Denson Flake, MD;  Location: [facility] OR;  Service: Thoracic;  Laterality: N/A;   VIDEO BRONCHOSCOPY WITH ENDOBRONCHIAL ULTRASOUND N/A 10/18/2020   Procedure: VIDEO BRONCHOSCOPY WITH ENDOBRONCHIAL ULTRASOUND;  Surgeon: Denson Flake, MD;  Location: [facility] OR;  Service: Thoracic;  Laterality: N/A;   WISDOM TOOTH EXTRACTION     Family History  Problem Relation Age of Onset   Arthritis Mother    Heart disease Mother    Stroke Mother    Diabetes Father    Heart disease Father    Hyperlipidemia Father    Obesity Father    Diabetes Sister  Obesity Sister    Pancreatitis Sister    Liver disease Sister        abscess   Alcohol  abuse Brother    Early death Brother    Kidney disease Brother    Obesity Brother    Heart disease Brother    Obesity Brother    Heart attack Brother    Cancer Paternal Grandmother    Colon cancer Neg Hx    Colon polyps Neg Hx    Esophageal cancer Neg Hx    Rectal cancer Neg Hx    Stomach cancer Neg Hx    Social History   Tobacco Use   Smoking status: Every Day    Current packs/day: 1.00    Average packs/day: 1 pack/day for 48.0 years (48.0 ttl pk-yrs)    Types: Cigarettes   Smokeless tobacco: Never   Tobacco comments:    1 pack smoked per day [facility] 09/19/22  Vaping Use   Vaping status: Former  Substance Use Topics   Alcohol  use: No   Drug use: No   Current Outpatient Medications  Medication Sig Dispense Refill   acetaminophen -codeine  (TYLENOL  #4) 300-60 MG tablet Take 1 tablet by mouth daily as needed for moderate pain (pain score 4-6). 30 tablet 2   azelastine  (OPTIVAR ) 0.05 % ophthalmic solution Place 1 drop into both eyes 2 (two) times daily. 6 mL 1   CALCIUM PO Take 650 mg by mouth daily.     Cholecalciferol (VITAMIN D3) 125 MCG (5000 UT) CAPS Take 5,000 Units by mouth daily.     Cyanocobalamin  (B-12) 100 MCG TABS      fluocinonide (LIDEX) 0.05 % external solution Apply 1 Application topically as needed.     guaiFENesin  (MUCINEX ) 600 MG  12 hr tablet Take 2 tablets (1,200 mg total) by mouth 2 (two) times daily. 30 tablet 0   linaclotide  (LINZESS ) 145 MCG CAPS capsule Take 1 capsule (145 mcg total) by mouth daily before breakfast. Take 30-60 minutes before breakfast 90 capsule 3   LORazepam  (ATIVAN ) 0.5 MG tablet TAKE 1 TABLET 30 MINUTES PRIOR TO MRI OR RADIATION 5 tablet 0   lovastatin  (MEVACOR ) 20 MG tablet TAKE 1 TABLET BY MOUTH EVERYDAY AT BEDTIME 90 tablet 1   metoprolol  succinate (TOPROL -XL) 25 MG 24 hr tablet TAKE 1/2 TABLET BY MOUTH DAILY 45 tablet 3   mirabegron  ER (MYRBETRIQ ) 25 MG TB24 tablet Take 1 tablet (25 mg total) by mouth daily. 30 tablet 6   Multiple Vitamins-Minerals (MULTIVITAMIN ADULT, MINERALS,) TABS      omeprazole  (PRILOSEC) 20 MG capsule Take 1 capsule (20 mg total) by mouth daily. 90 capsule 3   No current facility-administered medications for this visit.   Allergies  Allergen Reactions   Lyrica [Pregabalin] Swelling    Headache,severe eye pain, swelling   Butrans  [Buprenorphine ] Itching    Itching and redness at the area of the patch.   Chantix [Varenicline] Other (See Comments)    Strange dreams/hallucinations   Diclofenac  Swelling   Doxycycline  Nausea Only   Meloxicam Swelling   Tolmetin Other (See Comments)    Stomach pain   Nsaids Other (See Comments)    Stomach pain     Review of Systems: All systems reviewed and negative except where noted in HPI.    MR Brain W Wo Contrast Result Date: 01/31/2024 CLINICAL DATA:  Small cell lung cancer.  Brain metastases. EXAM: MRI HEAD WITHOUT AND WITH CONTRAST TECHNIQUE: Multiplanar, multiecho pulse sequences of the brain and surrounding structures  were obtained without and with intravenous contrast. CONTRAST:  8.5 mL Vueway  COMPARISON:  MR head without and with contrast 07/29/2023 and 01/24/2023. FINDINGS: Brain: No pathologic enhancement of the brain or meninges is present to suggest metastatic disease. Remote lacunar infarcts are again noted  within the cerebellum bilaterally. Periventricular and subcortical white matter changes are mildly advanced for age, stable. Deep brain nuclei are within normal limits. The ventricles are of normal size. No significant extraaxial fluid collection is present. The internal auditory canals are within normal limits. The brainstem and cerebellum are otherwise within normal limits. Vascular: Flow is present in the major intracranial arteries. Skull and upper cervical spine: Osseous lesion in the left parietal lobe is stable no new osseous lesions are present. Craniocervical junction is normal. The upper cervical spine is normal. Sinuses/Orbits: Minimal mastoid effusions are present bilaterally. No obstructing nasopharyngeal lesion is present. The paranasal sinuses and mastoid air cells are otherwise clear. The globes and orbits are within normal limits. IMPRESSION: 1. Stable MRI appearance of the brain. No evidence for metastatic disease to the brain or meninges. 2. Stable osseous lesion in the left parietal lobe. 3. Stable remote lacunar infarcts of the cerebellum bilaterally. 4. Stable periventricular and subcortical white matter changes. This likely reflects the sequela of chronic microvascular ischemia. Electronically Signed   By: Audree Leas M.D.   On: 01/31/2024 10:21   Lab Results  Component Value Date   WBC 7.4 02/07/2024   HGB 12.2 02/07/2024   HCT 37.5 02/07/2024   MCV 95.4 02/07/2024   PLT 310 02/07/2024    Lab Results  Component Value Date   NA 140 02/07/2024   CL 106 02/07/2024   K 4.0 02/07/2024   CO2 24 02/07/2024   BUN 7 02/07/2024   CREATININE 0.70 02/07/2024   EGFR 97 02/07/2024   CALCIUM 8.8 02/07/2024   ALBUMIN 4.9 12/16/2023   GLUCOSE 87 02/07/2024    Lab Results  Component Value Date   ALT 17 02/07/2024   AST 21 02/07/2024   ALKPHOS 51 12/16/2023   BILITOT 0.3 02/07/2024    Physical Exam: BP 122/76   Pulse 94   Ht 5' 8 (1.727 m)   Wt 193 lb 8 oz (87.8  kg)   LMP  (LMP Unknown)   SpO2 99%   BMI 29.42 kg/m  Constitutional: Pleasant,well-developed, female in no acute distress. Neurological: Alert and oriented to person place and time. Psychiatric: Normal mood and affect. Behavior is normal.   ASSESSMENT: 63 y.o. female here for assessment of the following  1. Gastroesophageal reflux disease, unspecified whether esophagitis present   2. Dysphagia, unspecified type   3. Long-term current use of proton pump inhibitor therapy   4. Chronic constipation   5. History of colon polyps    Mild esophagitis noted on prior EGD in the setting of hiatal hernia and intermittent dysphagia.  Modified barium swallow unremarkable.  On omeprazole  she has had some benefit in her symptoms which are pretty mild at this point in time.  Given her interval benefit I would recommend we continue the omeprazole  daily for now.  We discussed long-term risks of chronic PPI use and she understands this.  Given improvement I think benefits likely outweigh risks.  If she has any recurrence of dysphagia otherwise moving forward she will contact me.  If that was the case I would work her up further for dysmotility.  Otherwise, responding appropriately to Linzess .  Colonoscopy without any high risk or concerning changes.  Continue Linzess  145 mcg daily, she is quite happy with the regimen.  Follow-up once yearly or sooner with issues   PLAN: - continue omeprazole  20mg  / day - working well, dysphagia mostly resolved - discussed risks / benefits of long term PPI use, she understands, wishes to continue - if dysphagia recurs would workup for dysmotility - continue Linzess  / day - repeat colonoscopy in 5 years - f/u one year or sooner with issues  Christi Coward, MD [facility]

## 2024-02-25 ENCOUNTER — Encounter: Payer: Self-pay | Admitting: Family Medicine

## 2024-02-25 DIAGNOSIS — N62 Hypertrophy of breast: Secondary | ICD-10-CM

## 2024-02-29 ENCOUNTER — Other Ambulatory Visit: Payer: Self-pay | Admitting: Family Medicine

## 2024-02-29 ENCOUNTER — Other Ambulatory Visit: Payer: Self-pay | Admitting: Physician Assistant

## 2024-02-29 DIAGNOSIS — E785 Hyperlipidemia, unspecified: Secondary | ICD-10-CM

## 2024-02-29 DIAGNOSIS — H1013 Acute atopic conjunctivitis, bilateral: Secondary | ICD-10-CM

## 2024-03-02 ENCOUNTER — Encounter: Payer: Self-pay | Admitting: *Deleted

## 2024-03-06 ENCOUNTER — Ambulatory Visit (INDEPENDENT_AMBULATORY_CARE_PROVIDER_SITE_OTHER): Admitting: Plastic Surgery

## 2024-03-06 ENCOUNTER — Encounter: Payer: Self-pay | Admitting: Plastic Surgery

## 2024-03-06 VITALS — BP 152/83 | HR 117 | Ht 68.0 in | Wt 190.4 lb

## 2024-03-06 DIAGNOSIS — G8929 Other chronic pain: Secondary | ICD-10-CM

## 2024-03-06 DIAGNOSIS — M25511 Pain in right shoulder: Secondary | ICD-10-CM | POA: Diagnosis not present

## 2024-03-06 DIAGNOSIS — Z6828 Body mass index (BMI) 28.0-28.9, adult: Secondary | ICD-10-CM | POA: Diagnosis not present

## 2024-03-06 DIAGNOSIS — M545 Low back pain, unspecified: Secondary | ICD-10-CM

## 2024-03-06 DIAGNOSIS — N62 Hypertrophy of breast: Secondary | ICD-10-CM | POA: Insufficient documentation

## 2024-03-06 NOTE — Progress Notes (Signed)
 Patient ID: Susan Cortez, female    DOB: [birth year], 63 y.o.   MRN: [id number]   Chief Complaint  Patient presents with   Consult   Breast Problem    HPI Mammary Hyperplasia: The patient is a 63 y.o. female with a history of mammary hyperplasia for several years.  She has extremely large breasts causing symptoms that include the following: Back pain in the upper and lower back, including neck pain. She pulls or pins her bra straps to provide better lift and relief of the pressure and pain. She notices relief by holding her breast up manually.  Her shoulder straps cause grooves and pain and pressure that requires padding for relief. Pain medication is sometimes required with motrin and tylenol .  Activities that are hindered by enlarged breasts include: exercise and running.  She has tried supportive clothing as well as fitted bras without improvement.  Her breasts are extremely large and fairly symmetric.  She has hyperpigmentation of the inframammary area on both sides.  The sternal to nipple distance on the right is 38 cm and the left is 40 cm.  The IMF distance is 16 cm.  She is 5 feet 8 inches tall and weighs 190 pounds.  The BMI = 28 kg/m.  Preoperative bra size = DD cup.  The estimated excess breast tissue to be removed at the time of surgery = 600-650 grams on the left and 600-650 grams on the right.  Mammogram history: 2024 negative but needs another one for this year.  Family history of breast cancer:  none.  Tobacco use:  current.   The patient expresses the desire to pursue surgical intervention.  The patient's past medical history is positive for spinal stenosis, hyperlipidemia, prediabetes, COPD, tachycardia, chronic bilateral back pain.  The patient's past surgical history includes bronchoscopy, shoulder arthroscopy, tonsillectomy, C-section, tubal ligation and extraction of wisdom teeth.  Review of Systems  Constitutional: Negative.   HENT: Negative.    Eyes: Negative.    Respiratory: Negative.    Cardiovascular: Negative.   Gastrointestinal: Negative.   Endocrine: Negative.   Genitourinary: Negative.   Musculoskeletal:  Positive for back pain and neck pain.  Skin:  Positive for rash.    Past Medical History:  Diagnosis Date   Allergy    Arthritis    Basal cell carcinoma 2018   Removed from Right neck per patient    COPD (chronic obstructive pulmonary disease) (HCC) 06/2019   Diverticulosis 2013   Dyslipidemia 03/01/2018   GERD (gastroesophageal reflux disease) 04/2021   HLD (hyperlipidemia)    Hx of adenomatous colonic polyps 2013   OAB (overactive bladder)    Pre-diabetes    Small cell lung cancer (HCC) 11/09/2020   Stroke (HCC) 2022    Past Surgical History:  Procedure Laterality Date   CESAREAN SECTION     X 3   COLONOSCOPY  2013, 2016, 2019   hx colon polyps   DILATION AND CURETTAGE OF UTERUS     FUDUCIAL PLACEMENT Right 10/18/2020   Procedure: PLACEMENT OF FUDUCIAL;  Surgeon: Shelah Lamar GORMAN, MD;  Location: [facility];  Service: Thoracic;  Laterality: Right;   IR IMAGING GUIDED PORT INSERTION  11/29/2020   IR REMOVAL TUN ACCESS W/ PORT W/O FL MOD SED  05/08/2022   SHOULDER ARTHROSCOPY WITH OPEN ROTATOR CUFF REPAIR Right 05/2009   TONSILLECTOMY  1977   TUBAL LIGATION     VIDEO BRONCHOSCOPY WITH ENDOBRONCHIAL NAVIGATION N/A 10/18/2020   Procedure: VIDEO  BRONCHOSCOPY WITH ENDOBRONCHIAL NAVIGATION;  Surgeon: Shelah Lamar RAMAN, MD;  Location: [facility];  Service: Thoracic;  Laterality: N/A;   VIDEO BRONCHOSCOPY WITH ENDOBRONCHIAL ULTRASOUND N/A 10/18/2020   Procedure: VIDEO BRONCHOSCOPY WITH ENDOBRONCHIAL ULTRASOUND;  Surgeon: Shelah Lamar RAMAN, MD;  Location: [facility];  Service: Thoracic;  Laterality: N/A;   WISDOM TOOTH EXTRACTION        Current Outpatient Medications:    acetaminophen -codeine  (TYLENOL  #4) 300-60 MG tablet, Take 1 tablet by mouth daily as needed for moderate pain (pain score 4-6)., Disp: 30 tablet, Rfl: 2   azelastine  (OPTIVAR ) 0.05 %  ophthalmic solution, INSTILL 1 DROP INTO BOTH EYES TWICE A DAY, Disp: 18 mL, Rfl: 0   CALCIUM PO, Take 650 mg by mouth daily., Disp: , Rfl:    Cholecalciferol (VITAMIN D3) 125 MCG (5000 UT) CAPS, Take 5,000 Units by mouth daily., Disp: , Rfl:    Cyanocobalamin  (B-12) 100 MCG TABS, , Disp: , Rfl:    fluocinonide (LIDEX) 0.05 % external solution, Apply 1 Application topically as needed., Disp: , Rfl:    guaiFENesin  (MUCINEX ) 600 MG 12 hr tablet, Take 2 tablets (1,200 mg total) by mouth 2 (two) times daily., Disp: 30 tablet, Rfl: 0   linaclotide  (LINZESS ) 145 MCG CAPS capsule, Take 1 capsule (145 mcg total) by mouth daily before breakfast. Take 30-60 minutes before breakfast, Disp: 90 capsule, Rfl: 3   LORazepam  (ATIVAN ) 0.5 MG tablet, TAKE 1 TABLET 30 MINUTES PRIOR TO MRI OR RADIATION, Disp: 5 tablet, Rfl: 0   lovastatin  (MEVACOR ) 20 MG tablet, TAKE 1 TABLET BY MOUTH EVERYDAY AT BEDTIME, Disp: 90 tablet, Rfl: 0   metoprolol  succinate (TOPROL -XL) 25 MG 24 hr tablet, TAKE 1/2 TABLET BY MOUTH DAILY, Disp: 45 tablet, Rfl: 3   mirabegron  ER (MYRBETRIQ ) 25 MG TB24 tablet, Take 1 tablet (25 mg total) by mouth daily., Disp: 30 tablet, Rfl: 6   Multiple Vitamins-Minerals (MULTIVITAMIN ADULT, MINERALS,) TABS, , Disp: , Rfl:    omeprazole  (PRILOSEC) 20 MG capsule, Take 1 capsule (20 mg total) by mouth daily., Disp: 90 capsule, Rfl: 3   Objective:   There were no vitals filed for this visit.  Physical Exam Vitals reviewed.  Constitutional:      Appearance: Normal appearance.  HENT:     Head: Atraumatic.  Cardiovascular:     Rate and Rhythm: Normal rate.     Pulses: Normal pulses.  Pulmonary:     Effort: Pulmonary effort is normal.  Abdominal:     Palpations: Abdomen is soft.  Skin:    General: Skin is warm.     Capillary Refill: Capillary refill takes less than 2 seconds.  Neurological:     Mental Status: She is alert and oriented to person, place, and time.  Psychiatric:        Mood and  Affect: Mood normal.        Behavior: Behavior normal.        Thought Content: Thought content normal.        Judgment: Judgment normal.     Assessment & Plan:  Chronic bilateral low back pain without sciatica  Chronic right shoulder pain  The procedure the patient selected and that was best for the patient was discussed. The risk were discussed and include but not limited to the following:  Breast asymmetry, fluid accumulation, firmness of the breast, inability to breast feed, loss of nipple or areola, skin loss, change in skin and nipple sensation, fat necrosis of the breast tissue, bleeding,  infection and healing delay.  There are risks of anesthesia and injury to nerves or blood vessels.  Allergic reaction to tape, suture and skin glue are possible.  There will be swelling.  Any of these can lead to the need for revisional surgery which is not included in this surgery.  A breast reduction has potential to interfere with diagnostic procedures in the future.  This procedure is best done when the breast is fully developed.  Changes in the breast will continue to occur over time: pregnancy, weight gain or weigh loss. No guarantees are given for a certain bra or breast size.    Total time: 40 minutes. This includes time spent with the patient during the visit as well as time spent before and after the visit reviewing the chart, documenting the encounter, ordering pertinent studies and literature for the patient.   Physical therapy:  not required Mammogram:  needed  Must be 3 months tobacco free prior to surgery.  She is a good candidate for bilateral breast reduction with liposuction.  She is going to come back and see us  in around about a month if she is tobacco free then we can submit for preauthorization.  Pictures were obtained of the patient and placed in the chart with the patient's or guardian's permission.   Estefana RAMAN Elan Brainerd, DO

## 2024-03-10 DIAGNOSIS — D225 Melanocytic nevi of trunk: Secondary | ICD-10-CM | POA: Diagnosis not present

## 2024-03-10 DIAGNOSIS — L821 Other seborrheic keratosis: Secondary | ICD-10-CM | POA: Diagnosis not present

## 2024-03-10 DIAGNOSIS — Z85828 Personal history of other malignant neoplasm of skin: Secondary | ICD-10-CM | POA: Diagnosis not present

## 2024-03-10 DIAGNOSIS — L905 Scar conditions and fibrosis of skin: Secondary | ICD-10-CM | POA: Diagnosis not present

## 2024-03-10 DIAGNOSIS — D1801 Hemangioma of skin and subcutaneous tissue: Secondary | ICD-10-CM | POA: Diagnosis not present

## 2024-03-13 NOTE — Patient Instructions (Addendum)
 It was good to see you again today!  I will be in touch with your labs Pneumonia vaccine today Assuming all is well please see m in 6-9 months

## 2024-03-13 NOTE — Progress Notes (Signed)
 [facility] [address] [place], [place] [address] [phone]  Date:  03/16/2024   Name:  Susan Cortez   DOB:  25-Sep-1960   MRN:  [id number]  PCP:  Watt Harlene BROCKS, MD    Chief Complaint: Follow-up (No concerns )   History of Present Illness:  Susan Cortez is a 63 y.o. very pleasant female patient who presents with the following:  Pt seen today for routine recheck Last seen by myself in October  History of cervical spinal stenosis, lower back pain, intermittent claudication, dyslipidemia, prediabetes. 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 She has questionable metastases to the skull  She was recently seen by plastic surgery, Dr. Lowery to discuss large breast size and desire for possible breast reduction; she plans to do this but has to quit smoking for 3 months first.  She is working on quitting   Most recent visit with oncology, Dr. Gatha was in May-current therapy is observation She did have a suspicious lung nodule seen this past April on CT but PET scan was reassuring, they plan to do another CT soon for follow-up  She had been on tylenol  with codeine  for years- she finally stopped taking this after many years and notes she actually feels better with that and her constipation is basically resolved.  She has Linzess  on hand but is not using it  Recommend pneumonia vaccine- will do today Mammogram is up-to-date Pap smear is up-to-date but will be due in October, can update today if she would like Colon cancer screening up-to-date Some labs on chart from June-CMP, CBC Can offer to follow-up on A1c, lipids today if she would  like  Lovastatin  Metoprolol  Omeprazole  Linzess   Lab Results  Component Value Date   HGBA1C 5.7 06/24/2023   Lipids:    Component Value Date/Time   CHOL 179 06/24/2023 1045   TRIG 187.0 (H) 06/24/2023 1045   HDL 56.30 06/24/2023 1045   VLDL 37.4 06/24/2023 1045   CHOLHDL 3 06/24/2023 1045    Patient Active Problem List   Diagnosis Date Noted   Symptomatic mammary hypertrophy 03/06/2024   Tobacco use 06/01/2021   Tachycardia 05/29/2021   Cough 03/28/2021   Port-A-Cath in place 01/02/2021   Chemotherapy induced neutropenia (HCC) 01/02/2021   COPD, mild (HCC) 12/19/2020   Encounter for antineoplastic chemotherapy 12/05/2020   Goals of care, counseling/discussion 11/09/2020   Small cell carcinoma of right lung (HCC) 11/09/2020   Mediastinal adenopathy 10/18/2020   Pulmonary nodule 1 cm or greater in diameter 06/28/2020   Encounter for screening for lung cancer 01/04/2020   Porokeratosis 12/02/2019   Plantar flexed metatarsal bone of left foot 12/02/2019   Plantar flexed metatarsal bone of right foot 12/02/2019   Prediabetes 05/31/2019   Dyslipidemia 03/01/2018   De Quervain's disease (tenosynovitis) 03/28/2017   Chronic bilateral low back pain without sciatica 03/28/2017   Sebaceous hyperplasia of face 06/26/2016   Chronic right shoulder pain 01/02/2016   Adenoma of left adrenal gland 12/26/2015   Intermittent claudication (HCC) 12/26/2015   Rotator cuff (capsule) sprain 03/26/2014   Spinal stenosis in cervical region 03/26/2014   Chronic pain 03/26/2014    Past Medical History:  Diagnosis Date  Allergy    Arthritis    Basal cell carcinoma 2018   Removed from Right neck per patient    COPD (chronic obstructive pulmonary disease) (HCC) 06/2019   Diverticulosis 2013   Dyslipidemia 03/01/2018   GERD (gastroesophageal reflux disease) 04/2021   HLD (hyperlipidemia)    Hx of adenomatous colonic polyps 2013   OAB (overactive bladder)    Pre-diabetes    Small  cell lung cancer (HCC) 11/09/2020   Stroke (HCC) 2022    Past Surgical History:  Procedure Laterality Date   CESAREAN SECTION     X 3   COLONOSCOPY  2013, 2016, 2019   hx colon polyps   DILATION AND CURETTAGE OF UTERUS     FUDUCIAL PLACEMENT Right 10/18/2020   Procedure: PLACEMENT OF FUDUCIAL;  Surgeon: Shelah Lamar RAMAN, MD;  Location: [facility];  Service: Thoracic;  Laterality: Right;   IR IMAGING GUIDED PORT INSERTION  11/29/2020   IR REMOVAL TUN ACCESS W/ PORT W/O FL MOD SED  05/08/2022   SHOULDER ARTHROSCOPY WITH OPEN ROTATOR CUFF REPAIR Right 05/2009   TONSILLECTOMY  1977   TUBAL LIGATION     VIDEO BRONCHOSCOPY WITH ENDOBRONCHIAL NAVIGATION N/A 10/18/2020   Procedure: VIDEO BRONCHOSCOPY WITH ENDOBRONCHIAL NAVIGATION;  Surgeon: Shelah Lamar RAMAN, MD;  Location: [facility];  Service: Thoracic;  Laterality: N/A;   VIDEO BRONCHOSCOPY WITH ENDOBRONCHIAL ULTRASOUND N/A 10/18/2020   Procedure: VIDEO BRONCHOSCOPY WITH ENDOBRONCHIAL ULTRASOUND;  Surgeon: Shelah Lamar RAMAN, MD;  Location: [facility];  Service: Thoracic;  Laterality: N/A;   WISDOM TOOTH EXTRACTION      Social History   Tobacco Use   Smoking status: Every Day    Current packs/day: 1.00    Average packs/day: 1 pack/day for 48.0 years (48.0 ttl pk-yrs)    Types: Cigarettes   Smokeless tobacco: Never   Tobacco comments:    1 pack smoked per day ARJ 09/19/22  Vaping Use   Vaping status: Former  Substance Use Topics   Alcohol  use: No   Drug use: No    Family History  Problem Relation Age of Onset   Arthritis Mother    Heart disease Mother    Stroke Mother    Diabetes Father    Heart disease Father    Hyperlipidemia Father    Obesity Father    Diabetes Sister    Obesity Sister    Pancreatitis Sister    Liver disease Sister        abscess   Alcohol  abuse Brother    Early death Brother    Kidney disease Brother    Obesity Brother    Heart disease Brother    Obesity Brother    Heart attack Brother    Cancer Paternal Grandmother     Colon cancer Neg Hx    Colon polyps Neg Hx    Esophageal cancer Neg Hx    Rectal cancer Neg Hx    Stomach cancer Neg Hx     Allergies  Allergen Reactions   Lyrica [Pregabalin] Swelling    Headache,severe eye pain, swelling   Butrans  [Buprenorphine ] Itching    Itching and redness at the area of the patch.   Chantix [Varenicline] Other (See Comments)    Strange dreams/hallucinations   Diclofenac  Swelling   Doxycycline  Nausea Only   Meloxicam Swelling   Tolmetin Other (See Comments)    Stomach pain   Nsaids Other (See Comments)    Stomach pain    Medication list has been reviewed and updated.  Current Outpatient Medications on File Prior to Visit  Medication Sig Dispense Refill   azelastine  (OPTIVAR ) 0.05 % ophthalmic solution INSTILL 1 DROP INTO BOTH EYES TWICE A DAY 18 mL 0   CALCIUM PO Take 650 mg by mouth daily.     Cholecalciferol (VITAMIN D3) 125 MCG (5000 UT) CAPS Take 5,000 Units by mouth daily.     Cyanocobalamin  (B-12) 100 MCG TABS      fluocinonide (LIDEX) 0.05 % external solution Apply 1 Application topically as needed.     guaiFENesin  (MUCINEX ) 600 MG 12 hr tablet Take 2 tablets (1,200 mg total) by mouth 2 (two) times daily. 30 tablet 0   linaclotide  (LINZESS ) 145 MCG CAPS capsule Take 1 capsule (145 mcg total) by mouth daily before breakfast. Take 30-60 minutes before breakfast 90 capsule 3   LORazepam  (ATIVAN ) 0.5 MG tablet TAKE 1 TABLET 30 MINUTES PRIOR TO MRI OR RADIATION 5 tablet 0   lovastatin  (MEVACOR ) 20 MG tablet TAKE 1 TABLET BY MOUTH EVERYDAY AT BEDTIME 90 tablet 0   metoprolol  succinate (TOPROL -XL) 25 MG 24 hr tablet TAKE 1/2 TABLET BY MOUTH DAILY 45 tablet 3   Multiple Vitamins-Minerals (MULTIVITAMIN ADULT, MINERALS,) TABS      omeprazole  (PRILOSEC) 20 MG capsule Take 1 capsule (20 mg total) by mouth daily. 90 capsule 3   No current facility-administered medications on file prior to visit.    Review of Systems:  As per HPI- otherwise  negative. Pulse Readings from Last 3 Encounters:  03/16/24 99  03/06/24 (!) 117  02/12/24 94      Physical Examination: Vitals:   03/16/24 1132  BP: 132/74  Pulse: 99  SpO2: 96%   Vitals:   03/16/24 1132  Weight: 190 lb 12.8 oz (86.5 kg)  Height: 5' 8 (1.727 m)   Body mass index is 29.01 kg/m. Ideal Body Weight: Weight in (lb) to have BMI = 25: 164.1  GEN: no acute distress.  Overweight, looks well.  Smoker HEENT: Atraumatic, Normocephalic.  Bilateral TM wnl, oropharynx normal.  PEERL,EOMI.   Ears and Nose: No external deformity. CV: RRR, No M/G/R. No JVD. No thrill. No extra heart sounds. PULM: CTA B, no wheezes, crackles, rhonchi. No retractions. No resp. distress. No accessory muscle use. ABD: S, NT, ND, +BS. No rebound. No HSM. EXTR: No c/c/e PSYCH: Normally interactive. Conversant.    Assessment and Plan: Dyslipidemia - Plan: Lipid panel  Prediabetes - Plan: Hemoglobin A1c  Small cell carcinoma of right lung, unspecified part of lung (HCC) - Plan: Pneumococcal conjugate vaccine 20-valent (Prevnar 20)  Estrogen deficiency - Plan: DG Bone Density  Patient seen today for follow-up. She is using lovastatin  for hyperlipidemia.  Check lipids today History of prediabetes, follow-up on A1c She has completed treatment for lung cancer, she is now on a surveillance program.  Gave her a pneumonia vaccine today.  She would like to have a breast reduction but must first quit smoking, this has been a good motivator for her and she has cut down quite a bit.  She notes she has tried other things such as Chantix and patches previously and did not tolerate well.  She is hoping to successfully cut down and eventually quit on her own  Update bone density  Will plan further follow- up pending labs.   Signed Harlene Schroeder, MD  Results for orders placed or performed in visit on 03/16/24  Hemoglobin A1c   Collection Time: 03/16/24 11:56 AM  Result Value Ref Range  Hgb A1c  MFr Bld 5.9 4.6 - 6.5 %  Lipid panel   Collection Time: 03/16/24 11:56 AM  Result Value Ref Range   Cholesterol 183 0 - 200 mg/dL   Triglycerides 832.9 (H) 0.0 - 149.0 mg/dL   HDL 32.49 >60.99 mg/dL   VLDL 66.5 0.0 - 59.9 mg/dL   LDL Cholesterol 82 0 - 99 mg/dL   Total CHOL/HDL Ratio 3    NonHDL 115.82

## 2024-03-16 ENCOUNTER — Encounter: Payer: Self-pay | Admitting: Family Medicine

## 2024-03-16 ENCOUNTER — Ambulatory Visit (INDEPENDENT_AMBULATORY_CARE_PROVIDER_SITE_OTHER): Admitting: Family Medicine

## 2024-03-16 VITALS — BP 132/74 | HR 99 | Ht 68.0 in | Wt 190.8 lb

## 2024-03-16 DIAGNOSIS — E785 Hyperlipidemia, unspecified: Secondary | ICD-10-CM

## 2024-03-16 DIAGNOSIS — E2839 Other primary ovarian failure: Secondary | ICD-10-CM

## 2024-03-16 DIAGNOSIS — C3491 Malignant neoplasm of unspecified part of right bronchus or lung: Secondary | ICD-10-CM

## 2024-03-16 DIAGNOSIS — R7303 Prediabetes: Secondary | ICD-10-CM | POA: Diagnosis not present

## 2024-03-16 LAB — LIPID PANEL
Cholesterol: 183 mg/dL (ref 0–200)
HDL: 67.5 mg/dL (ref >39.00)
LDL Cholesterol: 82 mg/dL (ref 0–99)
NonHDL: 115.82
Total CHOL/HDL Ratio: 3
Triglycerides: 167 mg/dL — ABNORMAL HIGH (ref 0.0–149.0)
VLDL: 33.4 mg/dL (ref 0.0–40.0)

## 2024-03-16 LAB — HEMOGLOBIN A1C: Hgb A1c MFr Bld: 5.9 % (ref 4.6–6.5)

## 2024-03-23 ENCOUNTER — Ambulatory Visit (HOSPITAL_BASED_OUTPATIENT_CLINIC_OR_DEPARTMENT_OTHER)
Admission: RE | Admit: 2024-03-23 | Discharge: 2024-03-23 | Disposition: A | Discharge status: Home / Self Care | Source: Ambulatory Visit | Attending: Family Medicine | Admitting: Family Medicine

## 2024-03-23 ENCOUNTER — Inpatient Hospital Stay (HOSPITAL_BASED_OUTPATIENT_CLINIC_OR_DEPARTMENT_OTHER): Admission: RE | Admit: 2024-03-23 | Source: Ambulatory Visit

## 2024-03-23 ENCOUNTER — Encounter (HOSPITAL_BASED_OUTPATIENT_CLINIC_OR_DEPARTMENT_OTHER): Payer: Self-pay

## 2024-03-23 DIAGNOSIS — E2839 Other primary ovarian failure: Secondary | ICD-10-CM

## 2024-03-23 DIAGNOSIS — Z1231 Encounter for screening mammogram for malignant neoplasm of breast: Secondary | ICD-10-CM | POA: Insufficient documentation

## 2024-03-23 DIAGNOSIS — M810 Age-related osteoporosis without current pathological fracture: Secondary | ICD-10-CM | POA: Diagnosis not present

## 2024-03-23 DIAGNOSIS — Z780 Asymptomatic menopausal state: Secondary | ICD-10-CM | POA: Diagnosis not present

## 2024-03-26 ENCOUNTER — Other Ambulatory Visit: Payer: Self-pay | Admitting: Family Medicine

## 2024-03-26 ENCOUNTER — Telehealth: Payer: Self-pay

## 2024-03-26 DIAGNOSIS — R928 Other abnormal and inconclusive findings on diagnostic imaging of breast: Secondary | ICD-10-CM

## 2024-03-26 NOTE — Telephone Encounter (Signed)
 Awaiting fax Copied from CRM [id number]. Topic: General - Other >> Mar 26, 2024 12:50 PM Rosina BIRCH wrote: Reason for CRM: Evette from the [facility] center called stating she need a urgent stat form signed before Saturday for a right breast diagnostic checking on the calcification [phone]

## 2024-03-27 NOTE — Telephone Encounter (Signed)
 Awaiting fax.

## 2024-03-27 NOTE — Telephone Encounter (Signed)
 Spoke with breast center to have form refaxed

## 2024-03-27 NOTE — Telephone Encounter (Signed)
 Form faxed

## 2024-03-28 ENCOUNTER — Ambulatory Visit
Admission: RE | Admit: 2024-03-28 | Discharge: 2024-03-28 | Disposition: A | Discharge status: Home / Self Care | Source: Ambulatory Visit | Attending: Family Medicine | Admitting: Family Medicine

## 2024-03-28 ENCOUNTER — Other Ambulatory Visit: Payer: Self-pay | Admitting: Family Medicine

## 2024-03-28 DIAGNOSIS — R928 Other abnormal and inconclusive findings on diagnostic imaging of breast: Secondary | ICD-10-CM

## 2024-03-28 DIAGNOSIS — R921 Mammographic calcification found on diagnostic imaging of breast: Secondary | ICD-10-CM | POA: Diagnosis not present

## 2024-03-29 ENCOUNTER — Encounter: Payer: Self-pay | Admitting: Family Medicine

## 2024-03-29 DIAGNOSIS — M810 Age-related osteoporosis without current pathological fracture: Secondary | ICD-10-CM | POA: Insufficient documentation

## 2024-03-30 MED ORDER — ALENDRONATE SODIUM 70 MG PO TABS: 70.0000 mg | ORAL_TABLET | ORAL | 11 refills | Status: AC

## 2024-04-06 ENCOUNTER — Ambulatory Visit: Admitting: Student

## 2024-04-07 ENCOUNTER — Ambulatory Visit (HOSPITAL_COMMUNITY)
Admission: RE | Admit: 2024-04-07 | Discharge: 2024-04-07 | Disposition: A | Discharge status: Home / Self Care | Source: Ambulatory Visit | Attending: Internal Medicine | Admitting: Internal Medicine

## 2024-04-07 ENCOUNTER — Inpatient Hospital Stay: Attending: Internal Medicine

## 2024-04-07 DIAGNOSIS — Z08 Encounter for follow-up examination after completed treatment for malignant neoplasm: Secondary | ICD-10-CM | POA: Diagnosis not present

## 2024-04-07 DIAGNOSIS — Z9221 Personal history of antineoplastic chemotherapy: Secondary | ICD-10-CM | POA: Diagnosis not present

## 2024-04-07 DIAGNOSIS — F17210 Nicotine dependence, cigarettes, uncomplicated: Secondary | ICD-10-CM | POA: Diagnosis not present

## 2024-04-07 DIAGNOSIS — Z923 Personal history of irradiation: Secondary | ICD-10-CM | POA: Diagnosis not present

## 2024-04-07 DIAGNOSIS — C3490 Malignant neoplasm of unspecified part of unspecified bronchus or lung: Secondary | ICD-10-CM | POA: Diagnosis not present

## 2024-04-07 DIAGNOSIS — I700 Atherosclerosis of aorta: Secondary | ICD-10-CM | POA: Diagnosis not present

## 2024-04-07 DIAGNOSIS — Z85118 Personal history of other malignant neoplasm of bronchus and lung: Secondary | ICD-10-CM | POA: Insufficient documentation

## 2024-04-07 DIAGNOSIS — R590 Localized enlarged lymph nodes: Secondary | ICD-10-CM | POA: Diagnosis not present

## 2024-04-07 DIAGNOSIS — D3502 Benign neoplasm of left adrenal gland: Secondary | ICD-10-CM | POA: Diagnosis not present

## 2024-04-07 LAB — CBC WITH DIFFERENTIAL (CANCER CENTER ONLY)
Abs Immature Granulocytes: 0.01 K/uL (ref 0.00–0.07)
Basophils Absolute: 0.1 K/uL (ref 0.0–0.1)
Basophils Relative: 1 %
Eosinophils Absolute: 0.2 K/uL (ref 0.0–0.5)
Eosinophils Relative: 2 %
HCT: 43.5 % (ref 36.0–46.0)
Hemoglobin: 14.5 g/dL (ref 12.0–15.0)
Immature Granulocytes: 0 %
Lymphocytes Relative: 26 %
Lymphs Abs: 2 K/uL (ref 0.7–4.0)
MCH: 30.7 pg (ref 26.0–34.0)
MCHC: 33.3 g/dL (ref 30.0–36.0)
MCV: 92 fL (ref 80.0–100.0)
Monocytes Absolute: 0.6 K/uL (ref 0.1–1.0)
Monocytes Relative: 8 %
Neutro Abs: 5.1 K/uL (ref 1.7–7.7)
Neutrophils Relative %: 63 %
Platelet Count: 372 K/uL (ref 150–400)
RBC: 4.73 MIL/uL (ref 3.87–5.11)
RDW: 14.3 % (ref 11.5–15.5)
WBC Count: 8 K/uL (ref 4.0–10.5)
nRBC: 0 % (ref 0.0–0.2)

## 2024-04-07 LAB — CMP (CANCER CENTER ONLY)
ALT: 17 U/L (ref 0–44)
AST: 19 U/L (ref 15–41)
Albumin: 4.6 g/dL (ref 3.5–5.0)
Alkaline Phosphatase: 62 U/L (ref 38–126)
Anion gap: 8 (ref 5–15)
BUN: 8 mg/dL (ref 8–23)
CO2: 28 mmol/L (ref 22–32)
Calcium: 9.6 mg/dL (ref 8.9–10.3)
Chloride: 103 mmol/L (ref 98–111)
Creatinine: 0.83 mg/dL (ref 0.44–1.00)
GFR, Estimated: >60 mL/min (ref >60)
Glucose, Bld: 113 mg/dL — ABNORMAL HIGH (ref 70–99)
Potassium: 3.8 mmol/L (ref 3.5–5.1)
Sodium: 139 mmol/L (ref 135–145)
Total Bilirubin: 0.5 mg/dL (ref 0.0–1.2)
Total Protein: 7.3 g/dL (ref 6.5–8.1)

## 2024-04-07 MED ORDER — IOHEXOL 300 MG/ML  SOLN
75.0000 mL | Freq: Once | INTRAMUSCULAR | Status: AC | PRN
  Administered 2024-04-07 (×2): 75 mL via INTRAVENOUS

## 2024-04-09 DIAGNOSIS — H524 Presbyopia: Secondary | ICD-10-CM | POA: Diagnosis not present

## 2024-04-09 DIAGNOSIS — H2513 Age-related nuclear cataract, bilateral: Secondary | ICD-10-CM | POA: Diagnosis not present

## 2024-04-09 DIAGNOSIS — H04123 Dry eye syndrome of bilateral lacrimal glands: Secondary | ICD-10-CM | POA: Diagnosis not present

## 2024-04-13 ENCOUNTER — Encounter (INDEPENDENT_AMBULATORY_CARE_PROVIDER_SITE_OTHER): Payer: Self-pay | Admitting: Family Medicine

## 2024-04-13 DIAGNOSIS — Z720 Tobacco use: Secondary | ICD-10-CM | POA: Diagnosis not present

## 2024-04-14 ENCOUNTER — Inpatient Hospital Stay (HOSPITAL_BASED_OUTPATIENT_CLINIC_OR_DEPARTMENT_OTHER): Admitting: Internal Medicine

## 2024-04-14 VITALS — BP 127/80 | HR 117 | Temp 97.1°F | Resp 17 | Ht 68.0 in | Wt 191.0 lb

## 2024-04-14 DIAGNOSIS — Z923 Personal history of irradiation: Secondary | ICD-10-CM | POA: Diagnosis not present

## 2024-04-14 DIAGNOSIS — Z9221 Personal history of antineoplastic chemotherapy: Secondary | ICD-10-CM | POA: Diagnosis not present

## 2024-04-14 DIAGNOSIS — Z85118 Personal history of other malignant neoplasm of bronchus and lung: Secondary | ICD-10-CM | POA: Diagnosis not present

## 2024-04-14 DIAGNOSIS — Z08 Encounter for follow-up examination after completed treatment for malignant neoplasm: Secondary | ICD-10-CM | POA: Diagnosis not present

## 2024-04-14 DIAGNOSIS — C3490 Malignant neoplasm of unspecified part of unspecified bronchus or lung: Secondary | ICD-10-CM

## 2024-04-14 DIAGNOSIS — F17210 Nicotine dependence, cigarettes, uncomplicated: Secondary | ICD-10-CM | POA: Diagnosis not present

## 2024-04-14 MED ORDER — VARENICLINE TARTRATE (STARTER) 0.5 MG X 11 & 1 MG X 42 PO TBPK: ORAL_TABLET | ORAL | 0 refills | Status: DC

## 2024-04-14 NOTE — Progress Notes (Signed)
 [facility] [phone]   [phone]) [phone]  OFFICE PROGRESS NOTE  Copland, Harlene BROCKS, MD [address] [place] [address]   DIAGNOSIS: Limited stage (T1c, N2, M0) Small Cell Lung Cancer. She presented with a right upper lobe suprahilar nodule/adenopathy, right paratracheal lymph node, and a hypermetabolic right upper lobe nodule. She was diagnosed in February 2022.    PRIOR THERAPY:   1) Systemic chemotherapy with cisplatin  80 mg/m2 on day 1, etoposide  100 mg/m2 on days 1, 2, and 3 IV every 3 weeks with concurrent radiation.  Last dose 02/01/21. Status post 4 cycles.  2) prophylactic cranial irradiation under the care of Dr. Dewey.  Last treatment on 03/28/2021   CURRENT THERAPY: Observation   INTERVAL HISTORY: Susan Cortez 63 y.o. female returns to the clinic today for follow-up visit.  Discussed the use of AI scribe software for clinical note transcription with the patient, who gave verbal consent to proceed.  History of Present Illness Susan Cortez is a 63 year old female with limited stage small cell lung cancer who presents for evaluation with repeat CT scan of the chest for restaging of her disease.  Diagnosed with limited stage small cell lung cancer in February 2022, she completed systemic chemotherapy with cisplatin  and etoposide  for four cycles, with the last dose on February 01, 2021. This was concurrent with radiation therapy and followed by prophylactic cranial irradiation. She is currently under observation.  No new symptoms since her last visit four months ago. No chest pain, shortness of breath, or cough. No nausea, vomiting, headaches, or weight loss. She experiences brief 'cluster headaches' lasting about ten seconds, typically occurring at night when lying down.  She is attempting to quit smoking and has reduced her cigarette use.    MEDICAL HISTORY: Past Medical History:  Diagnosis Date   Allergy    Arthritis     Basal cell carcinoma 2018   Removed from Right neck per patient    COPD (chronic obstructive pulmonary disease) (HCC) 06/2019   Diverticulosis 2013   Dyslipidemia 03/01/2018   GERD (gastroesophageal reflux disease) 04/2021   HLD (hyperlipidemia)    Hx of adenomatous colonic polyps 2013   OAB (overactive bladder)    Osteoporosis 03/29/2024   Pre-diabetes    Small cell lung cancer (HCC) 11/09/2020   Stroke (HCC) 2022    ALLERGIES:  is allergic to lyrica [pregabalin], butrans  [buprenorphine ], chantix  [varenicline ], diclofenac , doxycycline , meloxicam, tolmetin, and nsaids.  MEDICATIONS:  Current Outpatient Medications  Medication Sig Dispense Refill   alendronate  (FOSAMAX ) 70 MG tablet Take 1 tablet (70 mg total) by mouth every 7 (seven) days. Take with a full glass of water on an empty stomach. 4 tablet 11   azelastine  (OPTIVAR ) 0.05 % ophthalmic solution INSTILL 1 DROP INTO BOTH EYES TWICE A DAY 18 mL 0   CALCIUM PO Take 650 mg by mouth daily.     Cholecalciferol (VITAMIN D3) 125 MCG (5000 UT) CAPS Take 5,000 Units by mouth daily.     Cyanocobalamin  (B-12) 100 MCG TABS      fluocinonide (LIDEX) 0.05 % external solution Apply 1 Application topically as needed.     guaiFENesin  (MUCINEX ) 600 MG 12 hr tablet Take 2 tablets (1,200 mg total) by mouth 2 (two) times daily. 30 tablet 0   linaclotide  (LINZESS ) 145 MCG CAPS capsule Take 1 capsule (145 mcg total) by mouth daily before breakfast. Take 30-60 minutes before breakfast 90  capsule 3   LORazepam  (ATIVAN ) 0.5 MG tablet TAKE 1 TABLET 30 MINUTES PRIOR TO MRI OR RADIATION 5 tablet 0   lovastatin  (MEVACOR ) 20 MG tablet TAKE 1 TABLET BY MOUTH EVERYDAY AT BEDTIME 90 tablet 0   metoprolol  succinate (TOPROL -XL) 25 MG 24 hr tablet TAKE 1/2 TABLET BY MOUTH DAILY 45 tablet 3   Multiple Vitamins-Minerals (MULTIVITAMIN ADULT, MINERALS,) TABS      omeprazole  (PRILOSEC) 20 MG capsule Take 1 capsule (20 mg total) by mouth daily. 90 capsule 3   No  current facility-administered medications for this visit.    SURGICAL HISTORY:  Past Surgical History:  Procedure Laterality Date   CESAREAN SECTION     X 3   COLONOSCOPY  2013, 2016, 2019   hx colon polyps   DILATION AND CURETTAGE OF UTERUS     FUDUCIAL PLACEMENT Right 10/18/2020   Procedure: PLACEMENT OF FUDUCIAL;  Surgeon: Shelah Lamar RAMAN, MD;  Location: [facility] OR;  Service: Thoracic;  Laterality: Right;   IR IMAGING GUIDED PORT INSERTION  11/29/2020   IR REMOVAL TUN ACCESS W/ PORT W/O FL MOD SED  05/08/2022   SHOULDER ARTHROSCOPY WITH OPEN ROTATOR CUFF REPAIR Right 05/2009   TONSILLECTOMY  1977   TUBAL LIGATION     VIDEO BRONCHOSCOPY WITH ENDOBRONCHIAL NAVIGATION N/A 10/18/2020   Procedure: VIDEO BRONCHOSCOPY WITH ENDOBRONCHIAL NAVIGATION;  Surgeon: Shelah Lamar RAMAN, MD;  Location: [facility] OR;  Service: Thoracic;  Laterality: N/A;   VIDEO BRONCHOSCOPY WITH ENDOBRONCHIAL ULTRASOUND N/A 10/18/2020   Procedure: VIDEO BRONCHOSCOPY WITH ENDOBRONCHIAL ULTRASOUND;  Surgeon: Shelah Lamar RAMAN, MD;  Location: [facility] OR;  Service: Thoracic;  Laterality: N/A;   WISDOM TOOTH EXTRACTION      REVIEW OF SYSTEMS:  Constitutional: positive for fatigue Eyes: negative Ears, nose, mouth, throat, and face: negative Respiratory: positive for cough Cardiovascular: negative Gastrointestinal: negative Genitourinary:negative Integument/breast: negative Hematologic/lymphatic: negative Musculoskeletal:negative Neurological: negative Behavioral/Psych: negative Endocrine: negative Allergic/Immunologic: negative   PHYSICAL EXAMINATION: General appearance: alert, cooperative, fatigued, and no distress Head: Normocephalic, without obvious abnormality, atraumatic Neck: no adenopathy, no JVD, supple, symmetrical, trachea midline, and thyroid  not enlarged, symmetric, no tenderness/mass/nodules Lymph nodes: Cervical, supraclavicular, and axillary nodes normal. Resp: clear to auscultation bilaterally Back: symmetric, no  curvature. ROM normal. No CVA tenderness. Cardio: regular rate and rhythm, S1, S2 normal, no murmur, click, rub or gallop GI: soft, non-tender; bowel sounds normal; no masses,  no organomegaly Extremities: extremities normal, atraumatic, no cyanosis or edema Neurologic: Alert and oriented X 3, normal strength and tone. Normal symmetric reflexes. Normal coordination and gait  ECOG PERFORMANCE STATUS: 1 - Symptomatic but completely ambulatory  Blood pressure 127/80, pulse (!) 117, temperature (!) 97.1 F (36.2 C), temperature source Temporal, resp. rate 17, height 5' 8 (1.727 m), weight 191 lb (86.6 kg), SpO2 98%.  LABORATORY DATA: Lab Results  Component Value Date   WBC 8.0 04/07/2024   HGB 14.5 04/07/2024   HCT 43.5 04/07/2024   MCV 92.0 04/07/2024   PLT 372 04/07/2024      Chemistry      Component Value Date/Time   NA 139 04/07/2024 0932   K 3.8 04/07/2024 0932   CL 103 04/07/2024 0932   CO2 28 04/07/2024 0932   BUN 8 04/07/2024 0932   CREATININE 0.83 04/07/2024 0932   CREATININE 0.70 02/07/2024 1120      Component Value Date/Time   CALCIUM 9.6 04/07/2024 0932   ALKPHOS 62 04/07/2024 0932   AST 19 04/07/2024 0932   ALT 17 04/07/2024  0932   BILITOT 0.5 04/07/2024 0932       RADIOGRAPHIC STUDIES: CT Chest W Contrast Result Date: 04/11/2024 CLINICAL DATA:  Small-cell lung cancer, staging. * Tracking Code: BO * EXAM: CT CHEST WITH CONTRAST TECHNIQUE: Multidetector CT imaging of the chest was performed during intravenous contrast administration. RADIATION DOSE REDUCTION: This exam was performed according to the departmental dose-optimization program which includes automated exposure control, adjustment of the mA and/or kV according to patient size and/or use of iterative reconstruction technique. CONTRAST:  75mL OMNIPAQUE  IOHEXOL  300 MG/ML  SOLN COMPARISON:  Multiple priors including CT December 16, 2023 and PET-CT Jan 02, 2024 FINDINGS: Cardiovascular: Aortic atherosclerosis.  Normal size heart. No significant pericardial effusion/thickening. Mediastinum/Nodes: Stable low-attenuation 14 mm right thyroid  nodule common no follow-up recommended. No pathologically enlarged mediastinal, left hilar or axillary lymph nodes. Right perihilar soft tissue thickening is similar over multiple prior examinations prominent right hilar lymph node measuring 7 mm in short axis on image 70/2 is unchanged when remeasured for consistency. No new discrete nodularity in the right hilum. Patulous esophagus with symmetric diffuse esophageal wall thickening is unchanged from prior. Lungs/Pleura: Fiducial markers in the right upper lobe. Similar masslike soft tissue about the right hilum/suprahilar area with a discrete nodular focus measuring 2.5 x 1.7 cm on image 56/8 previously 2.6 x 1.8 cm when remeasured for consistency. Additional nodular focus anterior to this on image 55/8 appears unchanged. Extensive reticulations with architectural distortion and bronchiectasis/bronchiolectasis extending from the treatment area involving the right upper and superior segment of the right lower lobes appear stable from prior examination. Subpleural nodular focus along the posterior margin measuring 12 x 9 mm on image 49/8 and another smaller focus anterior to the fissure measuring 6 mm on image 47/8 are stable from prior examination. Irregular nodule in the anterior right lower lobe caudal to the treatment area with internal bronchioles measures 13 x 8 mm on image 98/8, unchanged None of the above findings demonstrated abnormally hypermetabolic on PET-CT Jan 02, 2024. No new suspicious pulmonary nodules or masses. Upper Abdomen: Stable left adrenal nodules which did not demonstrate uptake on prior PET-CT and are compatible with adenomas. Musculoskeletal: No aggressive lytic or blastic lesion of bone. Multilevel degenerative changes spine. IMPRESSION: 1. Stable posttreatment changes in the perihilar right upper lobe and  superior segment of the right lower lobe. No convincing evidence of local recurrent disease. 2. Stable irregular nodule in the anterior right lower lobe caudal to the treatment area with internal bronchioles, favored to reflect an area of scarring. 3. Stable prominent right hilar lymph node measuring 7 mm in short axis. 4. Stable left adrenal adenomas. 5. Patulous esophagus with symmetric diffuse esophageal wall thickening, suggestive of esophagitis. 6. Aortic atherosclerosis. Aortic Atherosclerosis (ICD10-I70.0). Electronically Signed   By: Reyes Holder M.D.   On: 04/11/2024 09:58   MM 3D DIAGNOSTIC MAMMOGRAM UNILATERAL RIGHT BREAST Result Date: 03/28/2024 CLINICAL DATA:  63 year old female recalled for calcifications in the right breast. EXAM: DIGITAL DIAGNOSTIC UNILATERAL RIGHT MAMMOGRAM WITH TOMOSYNTHESIS AND CAD TECHNIQUE: Right digital diagnostic mammography and breast tomosynthesis was performed. The images were evaluated with computer-aided detection. COMPARISON:  Previous exam(s). ACR Breast Density Category a: The breasts are almost entirely fatty. FINDINGS: There are 3 small loosely grouped benign calcifications in the right breast. There are scattered stable benign calcifications seen throughout the right breast. Questioned small focal asymmetry in the upper outer right breast middle depth is unchanged dating back to 2020 and given its stability for greater  than 2 years can be considered benign in etiology. IMPRESSION: No mammographic evidence of malignancy in the right breast. RECOMMENDATION: Routine annual screening mammogram in 1 year. I have discussed the findings and recommendations with the patient. If applicable, a reminder letter will be sent to the patient regarding the next appointment. BI-RADS CATEGORY  2: Benign. Electronically Signed   By: Rosina Gelineau M.D.   On: 03/28/2024 12:44   MM 3D SCREENING MAMMOGRAM BILATERAL BREAST Result Date: 03/25/2024 CLINICAL DATA:  Screening. EXAM:  DIGITAL SCREENING BILATERAL MAMMOGRAM WITH TOMOSYNTHESIS AND CAD TECHNIQUE: Bilateral screening digital craniocaudal and mediolateral oblique mammograms were obtained. Bilateral screening digital breast tomosynthesis was performed. The images were evaluated with computer-aided detection. COMPARISON:  Previous exam(s). ACR Breast Density Category a: The breasts are almost entirely fatty. FINDINGS: In the right breast, calcifications warrant further evaluation with magnified views. In the left breast, no findings suspicious for malignancy. IMPRESSION: Further evaluation is suggested for calcifications in the right breast. RECOMMENDATION: Diagnostic mammogram of the right breast. (Code:[id number]) The patient will be contacted regarding the findings, and additional imaging will be scheduled. BI-RADS CATEGORY  0: Incomplete: Need additional imaging evaluation. Electronically Signed   By: Norleen Croak M.D.   On: 03/25/2024 15:18   DG Bone Density Result Date: 03/23/2024 EXAM: DUAL X-RAY ABSORPTIOMETRY (DXA) FOR BONE MINERAL DENSITY 03/23/2024 1:49 pm CLINICAL DATA:  63 year old Female Postmenopausal. Estrogen deficiency TECHNIQUE: An axial (e.g., hips, spine) and/or appendicular (e.g., radius) exam was performed, as appropriate, using GE Secretary/administrator at [employer]. Images are obtained for bone mineral density measurement and are not obtained for diagnostic purposes. [id number] Exclusions: L3-L4. COMPARISON:  01/15/2018. FINDINGS: Scan quality: Good. LUMBAR SPINE (L1-L2): BMD (in g/cm2): 0.847 T-score: -2.6 Z-score: -1.1 Rate of change from previous exam: -6.6 % LEFT FEMORAL NECK: BMD (in g/cm2): 0.852 T-score: -1.3 Z-score: 0.1 LEFT TOTAL HIP: BMD (in g/cm2): 0.825 T-score: -1.5 Z-score: -0.3 RIGHT FEMORAL NECK: BMD (in g/cm2): 0.766 T-score: -2.0 Z-score: -0.5 RIGHT TOTAL HIP: BMD (in g/cm2): 0.776 T-score: -1.8 Z-score: -0.7 DUAL-FEMUR TOTAL MEAN: Rate of change from previous  exam: -11.7 % FRAX 10-YEAR PROBABILITY OF FRACTURE: FRAX not reported as the lowest BMD is not in the osteopenia range. IMPRESSION: Osteoporosis based on BMD. Fracture risk is increased. Increased risk is based on low BMD. RECOMMENDATIONS: 1. All patients should optimize calcium and vitamin D intake. 2. Consider FDA-approved medical therapies in postmenopausal women and men aged 5 years and older, based on the following: - A hip or vertebral (clinical or morphometric) fracture - T-score less than or equal to -2.5 and secondary causes have been excluded. - Low bone mass (T-score between -1.0 and -2.5) and a 10-year probability of a hip fracture greater than or equal to 3% or a 10-year probability of a major osteoporosis-related fracture greater than or equal to 20% based on the [place] -adapted WHO algorithm. - Clinician judgment and/or patient preferences may indicate treatment for people with 10-year fracture probabilities above or below these levels 3. Patients with diagnosis of osteoporosis or at high risk for fracture should have regular bone mineral density tests. For patients eligible for Medicare, routine testing is allowed once every 2 years. The testing frequency can be increased to one year for patients who have rapidly progressing disease, those who are receiving or discontinuing medical therapy to restore bone mass, or have additional risk factors. Electronically Signed   By: Dina  Arceo M.D.   On: 03/23/2024 17:00  ASSESSMENT AND PLAN: This is a very pleasant 63 years old white female with limited stage (T1c, N2, M0) small cell lung cancer presented with right upper lobe and suprahilar mass in addition to mediastinal lymphadenopathy diagnosed in February 2022.  The patient underwent systemic chemotherapy with cisplatin  80 mg/M2 on day 1 and etoposide  100 mg/M2 on days 1, 2 and 3 every 3 weeks status post 4 cycles. This was concurrent with radiotherapy followed by prophylactic cranial irradiation. She  is currently on observation and feeling fine with no concerning complaints. She had repeat CT scan of the chest performed recently.  I personally and independently reviewed the scan and discussed the result with the patient today.  Her scan showed no concerning findings for disease recurrence or metastasis. Assessment and Plan Assessment & Plan Limited stage small cell lung cancer in remission under observation Limited stage small cell lung cancer diagnosed in February 2022, currently in remission. Status post systemic chemotherapy with cisplatin  and etoposide  for four cycles, last dose administered on February 01, 2021, concurrent with radiation therapy and followed by prophylactic cranial irradiation. Currently under observation with no new symptoms such as chest pain, shortness of breath, or cough. Recent CT scan of the chest shows no evidence of disease progression or metastasis, indicating well-managed disease. - Schedule follow-up appointment in six months. - Order CT scan and lab work one week prior to the next appointment.  Tobacco use Tobacco use with efforts to reduce smoking. She has cut back on smoking and is motivated to quit, especially in light of her desire to undergo breast reduction surgery, which requires being smoke-free. - Communicate with Dr. Ubaldo regarding a prescription to aid in smoking cessation. The patient was advised to call immediately if she has any other concerning symptoms in the interval. The patient voices understanding of current disease status and treatment options and is in agreement with the current care plan.  All questions were answered. The patient knows to call the clinic with any problems, questions or concerns. We can certainly see the patient much sooner if necessary.  Disclaimer: This note was dictated with voice recognition software. Similar sounding words can inadvertently be transcribed and may not be corrected upon review.

## 2024-04-14 NOTE — Addendum Note (Signed)
 Addended by: WATT RAISIN C on: 04/14/2024 07:15 PM   Modules accepted: Orders

## 2024-04-14 NOTE — Telephone Encounter (Signed)

## 2024-04-16 ENCOUNTER — Telehealth: Payer: Self-pay | Admitting: Internal Medicine

## 2024-04-16 NOTE — Telephone Encounter (Signed)
 Scheduled appointments with the patient per LOS.

## 2024-04-21 ENCOUNTER — Ambulatory Visit: Admitting: Registered Nurse

## 2024-04-28 ENCOUNTER — Ambulatory Visit

## 2024-04-28 VITALS — Ht 68.0 in | Wt 191.0 lb

## 2024-04-28 DIAGNOSIS — Z0000 Encounter for general adult medical examination without abnormal findings: Secondary | ICD-10-CM | POA: Diagnosis not present

## 2024-04-28 NOTE — Progress Notes (Signed)
 Subjective:   Susan Cortez is a 63 y.o. who presents for a Medicare Wellness preventive visit.  As a reminder, Annual Wellness Visits don't include a physical exam, and some assessments may be limited, especially if this visit is performed virtually. We may recommend an in-person follow-up visit with your provider if needed.  Visit Complete: Virtual I connected with  Susan Cortez on 04/30/24 by a audio enabled telemedicine application and verified that I am speaking with the correct person using two identifiers.  Patient Location: Home  Provider Location: Home Office  I discussed the limitations of evaluation and management by telemedicine. The patient expressed understanding and agreed to proceed.  Vital Signs: Because this visit was a virtual/telehealth visit, some criteria may be missing or patient reported. Any vitals not documented were not able to be obtained and vitals that have been documented are patient reported.    Persons Participating in Visit: Patient.  AWV Questionnaire: Yes: Patient Medicare AWV questionnaire was completed by the patient on 04/21/24; I have confirmed that all information answered by patient is correct and no changes since this date.  Cardiac Risk Factors include: advanced age (>12men, >26 women);dyslipidemia     Objective:    Today's Vitals   04/28/24 1128  Weight: 191 lb (86.6 kg)  Height: 5' 8 (1.727 m)   Body mass index is 29.04 kg/m.     04/28/2024   11:36 AM 01/31/2024   10:34 AM 08/05/2023    9:10 AM 04/25/2023    1:02 PM 01/25/2023   11:30 AM 09/20/2022    8:30 AM 07/30/2022   11:18 AM  Advanced Directives  Does Patient Have a Medical Advance Directive? Yes Yes Yes Yes Yes Yes Yes  Type of Estate agent of [place];Living will   Healthcare Power of [place];Living will Living will;Healthcare Power of Attorney    Does patient want to make changes to medical advance directive? No - Patient declined   No -  Patient declined     Copy of Healthcare Power of Attorney in Chart? Yes - validated most recent copy scanned in chart (See row information)   Yes - validated most recent copy scanned in chart (See row information)       Current Medications (verified) Outpatient Encounter Medications as of 04/28/2024  Medication Sig   alendronate  (FOSAMAX ) 70 MG tablet Take 1 tablet (70 mg total) by mouth every 7 (seven) days. Take with a full glass of water on an empty stomach.   azelastine  (OPTIVAR ) 0.05 % ophthalmic solution INSTILL 1 DROP INTO BOTH EYES TWICE A DAY   CALCIUM  PO Take 650 mg by mouth daily.   Cholecalciferol (VITAMIN D3) 125 MCG (5000 UT) CAPS Take 5,000 Units by mouth daily.   Cyanocobalamin  (B-12) 100 MCG TABS    fluocinonide (LIDEX) 0.05 % external solution Apply 1 Application topically as needed.   guaiFENesin  (MUCINEX ) 600 MG 12 hr tablet Take 2 tablets (1,200 mg total) by mouth 2 (two) times daily.   linaclotide  (LINZESS ) 145 MCG CAPS capsule Take 1 capsule (145 mcg total) by mouth daily before breakfast. Take 30-60 minutes before breakfast   LORazepam  (ATIVAN ) 0.5 MG tablet TAKE 1 TABLET 30 MINUTES PRIOR TO MRI OR RADIATION   metoprolol  succinate (TOPROL -XL) 25 MG 24 hr tablet TAKE 1/2 TABLET BY MOUTH DAILY   Multiple Vitamins-Minerals (MULTIVITAMIN ADULT, MINERALS,) TABS    omeprazole  (PRILOSEC) 20 MG capsule Take 1 capsule (20 mg total) by mouth daily.   Varenicline   Tartrate, Starter, (CHANTIX  STARTING MONTH PAK) 0.5 MG X 11 & 1 MG X 42 TBPK Use as directed on package.  Start medication 1 week prior to smoking sensation   [DISCONTINUED] lovastatin  (MEVACOR ) 20 MG tablet TAKE 1 TABLET BY MOUTH EVERYDAY AT BEDTIME   No facility-administered encounter medications on file as of 04/28/2024.    Allergies (verified) Lyrica [pregabalin], Butrans  [buprenorphine ], Chantix  [varenicline ], Diclofenac , Doxycycline , Meloxicam, Tolmetin, and Nsaids   History: Past Medical History:  Diagnosis Date    Allergy    Arthritis    Basal cell carcinoma 2018   Removed from Right neck per patient    COPD (chronic obstructive pulmonary disease) (HCC) 06/2019   Diverticulosis 2013   Dyslipidemia 03/01/2018   GERD (gastroesophageal reflux disease) 04/2021   HLD (hyperlipidemia)    Hx of adenomatous colonic polyps 2013   OAB (overactive bladder)    Osteoporosis 03/29/2024   Pre-diabetes    Small cell lung cancer (HCC) 11/09/2020   Stroke (HCC) 2022   Past Surgical History:  Procedure Laterality Date   CESAREAN SECTION     X 3   COLONOSCOPY  2013, 2016, 2019   hx colon polyps   DILATION AND CURETTAGE OF UTERUS     FUDUCIAL PLACEMENT Right 10/18/2020   Procedure: PLACEMENT OF FUDUCIAL;  Surgeon: Shelah Lamar RAMAN, MD;  Location: [facility] OR;  Service: Thoracic;  Laterality: Right;   IR IMAGING GUIDED PORT INSERTION  11/29/2020   IR REMOVAL TUN ACCESS W/ PORT W/O FL MOD SED  05/08/2022   SHOULDER ARTHROSCOPY WITH OPEN ROTATOR CUFF REPAIR Right 05/2009   TONSILLECTOMY  1977   TUBAL LIGATION     VIDEO BRONCHOSCOPY WITH ENDOBRONCHIAL NAVIGATION N/A 10/18/2020   Procedure: VIDEO BRONCHOSCOPY WITH ENDOBRONCHIAL NAVIGATION;  Surgeon: Shelah Lamar RAMAN, MD;  Location: [facility] OR;  Service: Thoracic;  Laterality: N/A;   VIDEO BRONCHOSCOPY WITH ENDOBRONCHIAL ULTRASOUND N/A 10/18/2020   Procedure: VIDEO BRONCHOSCOPY WITH ENDOBRONCHIAL ULTRASOUND;  Surgeon: Shelah Lamar RAMAN, MD;  Location: [facility] OR;  Service: Thoracic;  Laterality: N/A;   WISDOM TOOTH EXTRACTION     Family History  Problem Relation Age of Onset   Arthritis Mother    Heart disease Mother    Stroke Mother    Diabetes Father    Heart disease Father    Hyperlipidemia Father    Obesity Father    Diabetes Sister    Obesity Sister    Pancreatitis Sister    Liver disease Sister        abscess   Alcohol  abuse Brother    Early death Brother    Kidney disease Brother    Obesity Brother    Heart disease Brother    Obesity Brother    Heart attack Brother     Cancer Paternal Grandmother    Colon cancer Neg Hx    Colon polyps Neg Hx    Esophageal cancer Neg Hx    Rectal cancer Neg Hx    Stomach cancer Neg Hx    Social History   Socioeconomic History   Marital status: Divorced    Spouse name: Not on file   Number of children: Not on file   Years of education: Not on file   Highest education level: Associate degree: academic program  Occupational History   Not on file  Tobacco Use   Smoking status: Every Day    Current packs/day: 1.00    Average packs/day: 1 pack/day for 48.0 years (48.0 ttl pk-yrs)    Types:  Cigarettes   Smokeless tobacco: Never   Tobacco comments:    1 pack smoked per day ARJ 09/19/22  Vaping Use   Vaping status: Former  Substance and Sexual Activity   Alcohol  use: No   Drug use: No   Sexual activity: Not on file    Comment: Tubal Ligation  Other Topics Concern   Not on file  Social History Narrative   Not on file   Social Drivers of Health   Financial Resource Strain: Low Risk  (04/28/2024)   Overall Financial Resource Strain (CARDIA)    Difficulty of Paying Living Expenses: Not hard at all  Food Insecurity: No Food Insecurity (04/28/2024)   Hunger Vital Sign    Worried About Running Out of Food in the Last Year: Never true    Ran Out of Food in the Last Year: Never true  Transportation Needs: No Transportation Needs (04/28/2024)   PRAPARE - Administrator, Civil Service (Medical): No    Lack of Transportation (Non-Medical): No  Physical Activity: Inactive (04/28/2024)   Exercise Vital Sign    Days of Exercise per Week: 0 days    Minutes of Exercise per Session: 0 min  Stress: No Stress Concern Present (04/28/2024)   [employer] of Occupational Health - Occupational Stress Questionnaire    Feeling of Stress: Not at all  Social Connections: Socially Isolated (04/28/2024)   Social Connection and Isolation Panel    Frequency of Communication with Friends and Family: More than three times a  week    Frequency of Social Gatherings with Friends and Family: More than three times a week    Attends Religious Services: Never    Database administrator or Organizations: No    Attends Engineer, structural: Never    Marital Status: Divorced    Tobacco Counseling Ready to quit: Yes Counseling given: Yes Tobacco comments: 1 pack smoked per day ARJ 09/19/22    Clinical Intake:  Pre-visit preparation completed: Yes  Pain : No/denies pain     BMI - recorded: 29.04 Nutritional Status: BMI 25 -29 Overweight Nutritional Risks: None Diabetes: No  Lab Results  Component Value Date   HGBA1C 5.9 03/16/2024   HGBA1C 5.7 06/24/2023   HGBA1C 5.7 06/18/2022     How often do you need to have someone help you when you read instructions, pamphlets, or other written materials from your doctor or pharmacy?: 1 - Never  Interpreter Needed?: No  Information entered by :: Rojelio Blush LPN   Activities of Daily Living     04/28/2024   11:34 AM 04/21/2024   10:12 AM  In your present state of health, do you have any difficulty performing the following activities:  Hearing? 0 0  Vision? 0 0  Difficulty concentrating or making decisions? 0 0  Walking or climbing stairs? 0 0  Dressing or bathing? 0 0  Doing errands, shopping? 0 0  Preparing Food and eating ? N N  Using the Toilet? N N  In the past six months, have you accidently leaked urine? CINDERELLA CINDERELLA  Comment Wears Pads. Followed by PCP   Do you have problems with loss of bowel control? N N  Managing your Medications? N N  Managing your Finances? N N  Housekeeping or managing your Housekeeping? N N    Patient Care Team: Copland, Harlene BROCKS, MD as PCP - General (Family Medicine) Alvan Ronal BRAVO, MD (Inactive) as PCP - Cardiology (Cardiology) Carilyn Prentice BRAVO, MD  as Consulting Physician (Pain Medicine) Waylan Cain, MD as Consulting Physician (Ophthalmology)  I have updated your Care Teams any recent Medical Services you  may have received from other providers in the past year.     Assessment:   This is a routine wellness examination for Khyler.  Hearing/Vision screen Hearing Screening - Comments:: Denies hearing difficulties   Vision Screening - Comments:: Wears rx glasses - up to date with routine eye exams with  Dr Waylan   Goals Addressed               This Visit's Progress     Increase physical activity (pt-stated)        Get more active       Depression Screen     04/28/2024   11:33 AM 01/22/2024   10:37 AM 06/24/2023   10:18 AM 04/25/2023    1:10 PM 01/23/2023   10:46 AM 10/26/2022   10:35 AM 09/21/2022    3:19 PM  PHQ 2/9 Scores  PHQ - 2 Score 0 0 0 0 0 0 0    Fall Risk     04/28/2024   11:36 AM 04/21/2024   10:12 AM 01/22/2024   10:37 AM 09/25/2023   10:51 AM 06/24/2023   10:17 AM  Fall Risk   Falls in the past year? 0 0 0 0 0  Number falls in past yr: 0 0   0  Injury with Fall? 0 0   0  Risk for fall due to : No Fall Risks No Fall Risks   No Fall Risks  Follow up Falls evaluation completed Falls evaluation completed   Falls evaluation completed    MEDICARE RISK AT HOME:  Medicare Risk at Home Any stairs in or around the home?: No If so, are there any without handrails?: No Home free of loose throw rugs in walkways, pet beds, electrical cords, etc?: Yes Adequate lighting in your home to reduce risk of falls?: Yes Life alert?: No Use of a cane, walker or w/c?: No Grab bars in the bathroom?: No Shower chair or bench in shower?: Yes Elevated toilet seat or a handicapped toilet?: Yes  TIMED UP AND GO:  Was the test performed?  No  Cognitive Function: 6CIT completed    03/07/2018    4:08 PM 09/05/2016    9:50 AM  MMSE - Mini Mental State Exam  Orientation to time 5 5   Orientation to Place 5 5   Registration 3 3   Attention/ Calculation 5 5   Recall 3 3   Language- name 2 objects 2 2   Language- repeat 1 1  Language- follow 3 step command 3 3   Language- read &  follow direction 1 1   Write a sentence 1 1   Copy design 1 1   Total score 30 30      Data saved with a previous flowsheet row definition        04/28/2024   11:37 AM 04/25/2023    1:13 PM 04/19/2022    1:12 PM  6CIT Screen  What Year? 0 points 0 points 0 points  What month? 0 points 0 points 0 points  What time? 0 points 0 points 0 points  Count back from 20 0 points 0 points 0 points  Months in reverse 0 points 0 points 0 points  Repeat phrase 0 points 0 points 0 points  Total Score 0 points 0 points 0 points    Immunizations  Immunization History  Administered Date(s) Administered   Influenza,inj,Quad PF,6+ Mos 06/11/2018, 06/03/2019   PFIZER(Purple Top)SARS-COV-2 Vaccination 11/30/2019, 12/28/2019   PNEUMOCOCCAL CONJUGATE-20 03/16/2024   Td 06/18/2022   Tdap 08/28/2011   Zoster Recombinant(Shingrix ) 06/03/2019, 08/06/2019    Screening Tests Health Maintenance  Topic Date Due   COVID-19 Vaccine (3 - Pfizer risk series) 01/25/2020   INFLUENZA VACCINE  03/27/2024   Medicare Annual Wellness (AWV)  04/28/2025   MAMMOGRAM  03/23/2026   Cervical Cancer Screening (HPV/Pap Cotest)  06/14/2026   Colonoscopy  11/06/2028   DTaP/Tdap/Td (3 - Td or Tdap) 06/18/2032   Pneumococcal Vaccine: 50+ Years  Completed   Hepatitis C Screening  Completed   HIV Screening  Completed   Zoster Vaccines- Shingrix   Completed   Hepatitis B Vaccines 19-59 Average Risk  Aged Out   HPV VACCINES  Aged Out   Meningococcal B Vaccine  Aged Out    Health Maintenance  Health Maintenance Due  Topic Date Due   COVID-19 Vaccine (3 - Pfizer risk series) 01/25/2020   INFLUENZA VACCINE  03/27/2024   Health Maintenance Items Addressed:   Additional Screening:  Vision Screening: Recommended annual ophthalmology exams for early detection of glaucoma and other disorders of the eye. Would you like a referral to an eye doctor? No    Dental Screening: Recommended annual dental exams for proper oral  hygiene  Community Resource Referral / Chronic Care Management: CRR required this visit?  No   CCM required this visit?  No   Plan:    I have personally reviewed and noted the following in the patient's chart:   Medical and social history Use of alcohol , tobacco or illicit drugs  Current medications and supplements including opioid prescriptions. Patient is not currently taking opioid prescriptions. Functional ability and status Nutritional status Physical activity Advanced directives List of other physicians Hospitalizations, surgeries, and ER visits in previous 12 months Vitals Screenings to include cognitive, depression, and falls Referrals and appointments  In addition, I have reviewed and discussed with patient certain preventive protocols, quality metrics, and best practice recommendations. A written personalized care plan for preventive services as well as general preventive health recommendations were provided to patient.   Rojelio LELON Blush, LPN   [id number]   After Visit Summary: (MyChart) Due to this being a telephonic visit, the after visit summary with patients personalized plan was offered to patient via MyChart   Notes: Nothing significant to report at this time.

## 2024-04-28 NOTE — Patient Instructions (Addendum)
 Susan Cortez , Thank you for taking time out of your busy schedule to complete your Annual Wellness Visit with me. I enjoyed our conversation and look forward to speaking with you again next year. I, as well as your care team,  appreciate your ongoing commitment to your health goals. Please review the following plan we discussed and let me know if I can assist you in the future. Your Game plan/ To Do List    Referrals: If you haven't heard from the office you've been referred to, please reach out to them at the phone provided.   Follow up Visits: We will see or speak with you next year for your Next Medicare AWV with our clinical staff 05/06/25 @ 11:30a  Have you seen your provider in the last 6 months (3 months if uncontrolled diabetes)?   Next appointment with provider 06/24/24 @ 11a  Clinician Recommendations:  Aim for 30 minutes of exercise or brisk walking, 6-8 glasses of water, and 5 servings of fruits and vegetables each day.       This is a list of the screenings recommended for you:  Health Maintenance  Topic Date Due   COVID-19 Vaccine (3 - Pfizer risk series) 01/25/2020   Flu Shot  03/27/2024   Medicare Annual Wellness Visit  04/28/2025   Mammogram  03/23/2026   Pap with HPV screening  06/14/2026   Colon Cancer Screening  11/06/2028   DTaP/Tdap/Td vaccine (3 - Td or Tdap) 06/18/2032   Pneumococcal Vaccine for age over 36  Completed   Hepatitis C Screening  Completed   HIV Screening  Completed   Zoster (Shingles) Vaccine  Completed   Hepatitis B Vaccine  Aged Out   HPV Vaccine  Aged Out   Meningitis B Vaccine  Aged Out    Advanced directives: (In Chart) A copy of your advanced directives are scanned into your chart should your provider ever need it. Advance Care Planning is important because it:  [x]  Makes sure you receive the medical care that is consistent with your values, goals, and preferences  [x]  It provides guidance to your family and loved ones and reduces  their decisional burden about whether or not they are making the right decisions based on your wishes.  Follow the link provided in your after visit summary or read over the paperwork we have mailed to you to help you started getting your Advance Directives in place. If you need assistance in completing these, please reach out to us  so that we can help you!  See attachments for Preventive Care and Fall Prevention Tips.

## 2024-04-29 ENCOUNTER — Ambulatory Visit: Attending: Internal Medicine | Admitting: Internal Medicine

## 2024-04-29 VITALS — BP 105/70 | HR 102 | Ht 68.0 in | Wt 191.0 lb

## 2024-04-29 DIAGNOSIS — R002 Palpitations: Secondary | ICD-10-CM

## 2024-04-29 DIAGNOSIS — Z720 Tobacco use: Secondary | ICD-10-CM

## 2024-04-29 DIAGNOSIS — I700 Atherosclerosis of aorta: Secondary | ICD-10-CM | POA: Diagnosis not present

## 2024-04-29 DIAGNOSIS — C3411 Malignant neoplasm of upper lobe, right bronchus or lung: Secondary | ICD-10-CM | POA: Diagnosis not present

## 2024-04-29 DIAGNOSIS — E782 Mixed hyperlipidemia: Secondary | ICD-10-CM | POA: Diagnosis not present

## 2024-04-29 MED ORDER — ROSUVASTATIN CALCIUM 5 MG PO TABS: 5.0000 mg | ORAL_TABLET | Freq: Every day | ORAL | 3 refills | Status: AC

## 2024-04-29 NOTE — Patient Instructions (Addendum)
 Medication Instructions:  Your physician has recommended you make the following change in your medication:  STOP: lovastatin  START: rosuvastatin  5 mg by mouth once daily at bedtime  *If you need a refill on your cardiac medications before your next appointment, please call your pharmacy*  Lab Work: NONE  If you have labs (blood work) drawn today and your tests are completely normal, you will receive your results only by: MyChart Message (if you have MyChart) OR A paper copy in the mail If you have any lab test that is abnormal or we need to change your treatment, we will call you to review the results.  Testing/Procedures: NONE  Follow-Up: At [facility], you and your health needs are our priority.  As part of our continuing mission to provide you with exceptional heart care, our providers are all part of one team.  This team includes your primary Cardiologist (physician) and Advanced Practice Providers or APPs (Physician Assistants and Nurse Practitioners) who all work together to provide you with the care you need, when you need it.  1 yr   Provider:   Rollo Louder, PA

## 2024-04-29 NOTE — Progress Notes (Signed)
 Cardiology Office Note:  .    Date:  04/29/2024  ID:  Susan Cortez, DOB [birth year], MRN [id number] PCP: Watt Harlene BROCKS, MD  [facility] Providers Cardiologist:  Alvan Ronal BRAVO, MD (Inactive)     CC: Follow up secondary prevention   History of Present Illness: .    Susan Cortez is a 63 y.o. female  with sinus tachycardia who presents for a yearly cardiology follow-up.  She has a history of sinus tachycardia, with a slight increase in heart rates this year. An echocardiogram previously showed normal function.  In 2022, she was diagnosed with limited small cell carcinoma and underwent chemotherapy and radiation therapy, receiving over thirty gray of radiation to her chest wall.  She feels 'pretty good' but 'kind of tired', which she attributes to using Chantix  as she attempts to quit smoking. She managed to abstain from smoking for ten days but relapsed when driving to a doctor's appointment due to a psychological association with smoking while driving.  She has a history of a stroke. Her mother had strokes, and her brother has had two heart attacks, indicating a significant family history of cardiovascular issues.  She is currently taking metoprolol  and lovastatin  (Mevacor ) 20 mg. She feels she takes 'too much stuff' and has never taken cholesterol medication before, despite being on lovastatin .  Her heart rate issues began three years ago following her mother's death, coinciding with the onset of a persistent cough and mucus production. She experiences shortness of breath during activities like grocery shopping but is unsure if this is related to her heart rate.  Discussed the use of AI scribe software for clinical note transcription with the patient, who gave verbal consent to proceed.  Relevant histories: .  Social  - The patient engages in water aerobics and has been using Mounjaro for weight loss. She has experienced increased stress levels recently, which has  affected her weight loss progress.  ROS: As per HPI.   Studies Reviewed: .     Cardiac Studies & Procedures   ______________________________________________________________________________________________     ECHOCARDIOGRAM  ECHOCARDIOGRAM COMPLETE 08/11/2021  Narrative ECHOCARDIOGRAM REPORT    Patient Name:   Susan Cortez Date of Exam: 08/11/2021 Medical Rec #:  [id number]       Height:       68.0 in Accession #:    [id number]      Weight:       185.2 lb Date of Birth:  May 13, 1961       BSA:          1.978 m Patient Age:    63 years        BP:           110/74 mmHg Patient Gender: F               HR:           105 bpm. Exam Location:  [address]  Procedure: 3D Echo, 2D Echo, Cardiac Doppler and Color Doppler  Indications:    R00.0 Tachycardia  History:        Patient has no prior history of Echocardiogram examinations. Signs/Symptoms:Dizziness/Lightheadedness and Fatigue; Risk Factors:Family History of Coronary Artery Disease, Dyslipidemia and Current Smoker. Small Cell Lung Cancer status post Chemoradiation, with Mets to Brain status post Radiation.  Sonographer:    Heather Hawks RDCS Referring Phys: RONAL BRAVO ALVAN  IMPRESSIONS   1. Left ventricular ejection fraction, by estimation, is 60 to 65%. The left ventricle  has normal function. The left ventricle has no regional wall motion abnormalities. Left ventricular diastolic parameters are consistent with Grade I diastolic dysfunction (impaired relaxation). 2. Right ventricular systolic function is normal. The right ventricular size is normal. 3. The pericardial effusion is circumferential. 4. The mitral valve is normal in structure. No evidence of mitral valve regurgitation. No evidence of mitral stenosis. 5. The aortic valve is tricuspid. Aortic valve regurgitation is not visualized. No aortic stenosis is present. 6. The inferior vena cava is normal in size with greater than 50% respiratory variability,  suggesting right atrial pressure of 3 mmHg.  FINDINGS Left Ventricle: Left ventricular ejection fraction, by estimation, is 60 to 65%. The left ventricle has normal function. The left ventricle has no regional wall motion abnormalities. The left ventricular internal cavity size was normal in size. There is no left ventricular hypertrophy. Left ventricular diastolic parameters are consistent with Grade I diastolic dysfunction (impaired relaxation). Normal left ventricular filling pressure.  Right Ventricle: The right ventricular size is normal. No increase in right ventricular wall thickness. Right ventricular systolic function is normal.  Left Atrium: Left atrial size was normal in size.  Right Atrium: Right atrial size was normal in size.  Pericardium: Trivial pericardial effusion is present. The pericardial effusion is circumferential.  Mitral Valve: The mitral valve is normal in structure. No evidence of mitral valve regurgitation. No evidence of mitral valve stenosis.  Tricuspid Valve: The tricuspid valve is normal in structure. Tricuspid valve regurgitation is not demonstrated. No evidence of tricuspid stenosis.  Aortic Valve: The aortic valve is tricuspid. Aortic valve regurgitation is not visualized. No aortic stenosis is present.  Pulmonic Valve: The pulmonic valve was normal in structure. Pulmonic valve regurgitation is not visualized. No evidence of pulmonic stenosis.  Aorta: The aortic root is normal in size and structure.  Venous: The inferior vena cava is normal in size with greater than 50% respiratory variability, suggesting right atrial pressure of 3 mmHg.  IAS/Shunts: No atrial level shunt detected by color flow Doppler.   LEFT VENTRICLE PLAX 2D LVIDd:         4.60 cm   Diastology LVIDs:         2.80 cm   LV e' medial:    6.68 cm/s LV PW:         0.85 cm   LV E/e' medial:  8.4 LV IVS:        0.75 cm   LV e' lateral:   7.30 cm/s LVOT diam:     1.95 cm   LV E/e'  lateral: 7.7 LVOT Area:     2.99 cm  3D Volume EF: 3D EF:        66 % LV EDV:       116 ml LV ESV:       39 ml LV SV:        77 ml  LEFT ATRIUM           Index        RIGHT ATRIUM          Index LA diam:      3.40 cm 1.72 cm/m   RA Area:     8.19 cm LA Vol (A2C): 41.8 ml 21.13 ml/m  RA Volume:   16.10 ml 8.14 ml/m LA Vol (A4C): 29.4 ml 14.86 ml/m  AORTA Ao Root diam: 2.70 cm Ao Asc diam:  3.10 cm  MITRAL VALVE MV Area (PHT): cm  SHUNTS MV Decel Time: 165 msec    Systemic Diam: 1.95 cm MV E velocity: 56.07 cm/s MV A velocity: 87.30 cm/s MV E/A ratio:  0.64  Annabella Scarce MD Electronically signed by Annabella Scarce MD Signature Date/Time: 08/11/2021/3:47:45 PM    Final      CT SCANS  CT CARDIAC SCORING (SELF PAY ONLY) 06/21/2022  Addendum 06/22/2022  3:14 PM ADDENDUM REPORT: 06/22/2022 15:12  EXAM: OVER-READ INTERPRETATION  CT CHEST  The following report is an over-read performed by radiologist Dr. Oneil Fortis [facility] on 06/22/2022. This over-read does not include interpretation of cardiac or coronary anatomy or pathology. The coronary calcium  score interpretation by the cardiologist is attached.  COMPARISON:  12/28/2021  FINDINGS: Atherosclerotic calcifications aorta without aneurysm. Minimal pericardial effusion. Esophagus unremarkable. No adenopathy. Visualized upper abdomen normal. Bronchitic and interstitial changes particularly at the RIGHT perihilar region again identified. Underlying emphysematous changes. Metallic foreign body posterior RIGHT upper lobe image 1 unchanged. Parenchymal scarring at posterior RIGHT upper lobe and superior segment RIGHT lower lobe unchanged. Tiny nodular focus superior segment RIGHT lower lobe image 7 unchanged. No infiltrate, pleural effusion, or pneumothorax. No acute osseous findings.  IMPRESSION: Parenchymal scarring at posterior RIGHT upper lobe and superior segment RIGHT  lower lobe unchanged.  Tiny nodular focus superior segment RIGHT lower lobe unchanged; no follow-up imaging recommended.  No new intrathoracic abnormalities.  Aortic Atherosclerosis (ICD10-I70.0) and Emphysema (ICD10-J43.9).   Electronically Signed By: Oneil Kiss M.D. On: 06/22/2022 15:12  Narrative CLINICAL DATA:  20F for cardiovascular disease risk stratification  EXAM: Coronary Calcium  Score  TECHNIQUE: A gated, non-contrast computed tomography scan of the heart was performed using 3mm slice thickness. Axial images were analyzed on a dedicated workstation. Calcium  scoring of the coronary arteries was performed using the Agatston method.  FINDINGS: Coronary arteries: Normal origins.  Coronary Calcium  Score:  Total: 0  Percentile: 1st  Pericardium: Normal.  Ascending Aorta: Normal caliber.  Aortic atherosclerosis.  Non-cardiac: See separate report from [facility].  IMPRESSION: Coronary calcium  score of 0. This was 1st percentile for age-, race-, and sex-matched controls.  Aortic atherosclerosis.  RECOMMENDATIONS: Coronary artery calcium  (CAC) score is a strong predictor of incident coronary heart disease (CHD) and provides predictive information beyond traditional risk factors. CAC scoring is reasonable to use in the decision to withhold, postpone, or initiate statin therapy in intermediate-risk or selected borderline-risk asymptomatic adults (age 39-75 years and LDL-C >=70 to <190 mg/dL) who do not have diabetes or established atherosclerotic cardiovascular disease (ASCVD).* In intermediate-risk (10-year ASCVD risk >=7.5% to <20%) adults or selected borderline-risk (10-year ASCVD risk >=5% to <7.5%) adults in whom a CAC score is measured for the purpose of making a treatment decision the following recommendations have been made:  If CAC=0, it is reasonable to withhold statin therapy and reassess in 5 to 10 years, as long as higher risk  conditions are absent (diabetes mellitus, family history of premature CHD in first degree relatives (males <55 years; females <65 years), cigarette smoking, or LDL >=190 mg/dL).  If CAC is 1 to 99, it is reasonable to initiate statin therapy for patients >=62 years of age.  If CAC is >=100 or >=75th percentile, it is reasonable to initiate statin therapy at any age.  Cardiology referral should be considered for patients with CAC scores >=400 or >=75th percentile.  *2018 AHA/ACC/AACVPR/AAPA/ABC/ACPM/ADA/AGS/APhA/ASPC/NLA/PCNA Guideline on the Management of Blood Cholesterol: A Report of the American College of Cardiology/American Heart Association Task Force on Clinical Practice Guidelines.  J Am Coll Cardiol. 2019;73(24):3168-3209.  Annabella Scarce, MD  Electronically Signed: By: Annabella Scarce M.D. On: 06/22/2022 13:30     ______________________________________________________________________________________________       Physical Exam:    VS:  BP 105/70   Pulse (!) 102   Ht 5' 8 (1.727 m)   Wt 191 lb (86.6 kg)   LMP  (LMP Unknown)   SpO2 97%   BMI 29.04 kg/m    Wt Readings from Last 3 Encounters:  04/29/24 191 lb (86.6 kg)  04/28/24 191 lb (86.6 kg)  04/14/24 191 lb (86.6 kg)    Gen: no distress, smells of smoke   Neck: No JVD Cardiac: No Rubs or Gallops, no Murmur, Regular tachycardia, +2 radial pulses Respiratory: Decreased breath sounds bilaterally, normal effort, normal  respiratory rate GI: Soft, nontender, non-distended  MS: No  edema;  moves all extremities Integument: Skin feels  Neuro:  At time of evaluation, alert and oriented to person/place/time/situation  Psych: Normal affect, patient feels fine   ASSESSMENT AND PLAN: .    Sinus tachycardia Persistent sinus tachycardia without evidence of arrhythmias or cardiac-mediated causes. Previous workup by Dr. Alvan showed no pathological cause.  Aortic atherosclerosis Small Cell Cancer  requiring 30Gy+ of radiation Aortic atherosclerosis present without coronary artery blockages. Risk factors include family history and previous chest radiation. Blood pressure is low, with no hypertension. - smoking cessation - LDL goal < 55  Hyperlipidemia Cholesterol levels at the upper limits of normal with LDL at 82, above the target of under 70 or potentially under 55. Current treatment with lovastatin  is insufficient. - Discontinue lovastatin . - Initiate rosuvastatin  5 mg at bedtime. - Check lab work in two months to assess cholesterol levels.  Nicotine dependence, cigarettes Actively working on smoking cessation with Chantix . Smoking cessation is crucial for reducing cardiovascular risk and potentially improving tachycardia.  Follow-Up  One year f/u with KJ If no symptoms will transition to PRN via the graduation protocol  Stanly Leavens, MD FASE [facility] Cardiologist [facility]  [address] [place], [address] [place], [place] [address] [phone]  5:03 PM

## 2024-05-06 NOTE — Progress Notes (Signed)
   Referring Provider Copland, Harlene BROCKS, MD [address] [place],  [place] [address]   CC:  Chief Complaint  Patient presents with   Follow-up      Susan Cortez is an 63 y.o. female.  HPI: Patient is a 63 year old female with history of mammary hyperplasia.  She presents to the clinic today for further follow-up about smoking cessation.  Patient was seen by Dr. Lowery on 03/06/2024.  At this visit, patient complained of upper back and neck pain due to her enlarged breasts.  On exam, her STN on the right was 38 cm and her STN on the left was 40 cm.  Her BMI was 28 kg/m.  The estimated amount of breast tissue to be removed at the time of surgery was 600-658 g bilaterally.  Patient reported that she was a current tobacco user.  Patient was found to be a good candidate for bilateral breast reduction, but it was noted that patient had to be 3 months tobacco free prior to surgery.  Per chart review, patient had mammogram completed on 03/28/2024.  Result was BI-RADS Category 2 benign.  Today, patient reports she is doing well.  She states that she stopped smoking August 13, but did have a few cigarettes Labor Day weekend.  She states otherwise she has been taking Chantix  and feels that smoking cessation has been going well.  She denies any recent fevers or chills or changes in her health.  Patient states that she would still like to proceed with a breast reduction.  Review of Systems General: Denies any fevers or chills  Physical Exam    05/07/2024   11:49 AM 04/29/2024    3:19 PM 04/28/2024   11:28 AM  Vitals with BMI  Height  5' 8 5' 8  Weight  191 lbs 191 lbs  BMI  29.05 29.05  Systolic 117 105 --  Diastolic 77 70 --  Pulse 122 102     General:  No acute distress,  Alert and oriented, Non-Toxic, Normal speech and affect Psych: Normal behavior, normal mood MSK: ambulatory  Assessment/Plan  Symptomatic mammary hypertrophy   I discussed with the patient that  she should continue with smoking cessation.  Recommended that we do a telephone visit in about 2 months to see where she is at with that.  Discussed with her that if she is smoke-free, we should be able to go ahead and submit to insurance.  She expressed understanding.  Patient's heart rate was elevated in clinic today.  She states that it tends to be elevated when she is at the doctor's office.  She also reports history of tachycardia, but reports her cardiologist took her off her metoprolol  last week.  She denies any headaches, changes in her vision, dizziness, fevers, chills, chest pain or shortness of breath.  I discussed with her that she needs to call her cardiologist today and let them know about the elevated heart rate.  I discussed with her that if she develops any dizziness, headaches, chest pain, shortness of breath or any concerning symptoms she needs to go to the emergency room immediately.  Patient expressed understanding.  In preparation for surgery, we will go ahead and send a clearance to her cardiologist.  Patient expressed understanding.  All of the patient's questions were answered to her satisfaction.  [name] 05/07/2024, 12:10 PM

## 2024-05-07 ENCOUNTER — Ambulatory Visit (INDEPENDENT_AMBULATORY_CARE_PROVIDER_SITE_OTHER): Admitting: Student

## 2024-05-07 ENCOUNTER — Encounter: Payer: Self-pay | Admitting: Internal Medicine

## 2024-05-07 VITALS — BP 117/77 | HR 122

## 2024-05-07 DIAGNOSIS — Z720 Tobacco use: Secondary | ICD-10-CM

## 2024-05-07 DIAGNOSIS — N62 Hypertrophy of breast: Secondary | ICD-10-CM

## 2024-05-08 ENCOUNTER — Other Ambulatory Visit: Payer: Self-pay | Admitting: Family Medicine

## 2024-05-08 ENCOUNTER — Telehealth: Payer: Self-pay

## 2024-05-08 DIAGNOSIS — R000 Tachycardia, unspecified: Secondary | ICD-10-CM

## 2024-05-08 NOTE — Telephone Encounter (Signed)
   Pre-operative Risk Assessment    Patient Name: Susan Cortez  DOB: 1961-01-13 MRN: [id number]   Date of last office visit: 04/29/24 [facility], MD Date of next office visit: NONE   Request for Surgical Clearance    Procedure:  BILATERAL BREAST REDUCTION  Date of Surgery:  Clearance TBD                                Surgeon:  DR ESTEFANA FRITTER Surgeon's Group or Practice Name:  [facility] Phone number:  [phone] Fax number:  [phone]   Type of Clearance Requested:   - Medical    Type of Anesthesia:  Not Indicated   Additional requests/questions:    SignedLucie DELENA Ku   05/08/2024, 4:54 PM

## 2024-05-09 ENCOUNTER — Other Ambulatory Visit: Payer: Self-pay | Admitting: Family Medicine

## 2024-05-09 DIAGNOSIS — Z720 Tobacco use: Secondary | ICD-10-CM

## 2024-05-11 ENCOUNTER — Telehealth: Payer: Self-pay

## 2024-05-11 ENCOUNTER — Other Ambulatory Visit (HOSPITAL_COMMUNITY): Payer: Self-pay

## 2024-05-11 ENCOUNTER — Encounter: Payer: Self-pay | Admitting: Family Medicine

## 2024-05-11 MED ORDER — VARENICLINE TARTRATE 1 MG PO TABS
1.0000 mg | ORAL_TABLET | Freq: Two times a day (BID) | ORAL | 3 refills | Status: DC
Start: 2024-05-11 — End: 2024-06-24

## 2024-05-11 NOTE — Telephone Encounter (Signed)
 Pharmacy Patient Advocate Encounter   Received notification from Patient Advice Request messages that prior authorization for Varenicline  Starter pak is required/requested.   Insurance verification completed.   The patient is insured through [facility] .   Per test claim: PA required and submitted KEY/EOC/Request #: [id number] due to  this medication is covered on your plan's list of covered drugs.

## 2024-05-11 NOTE — Telephone Encounter (Signed)
 Dr. Santo You saw this patient 04/29/24 who needs clearance for bilateral breast reduction. She has a history of ST on metoprolol .   Can you please comment on risk stratification?

## 2024-05-12 NOTE — Telephone Encounter (Signed)
   Patient Name: Susan Cortez  DOB: 08-04-1961 MRN: [id number]  Primary Cardiologist: Alvan Ronal BRAVO, MD (Inactive)  Chart reviewed as part of pre-operative protocol coverage. Pt was seen in clinic on 04/29/2024 by Dr. Santo. Per Dr. Marlaine, At our evaluation she had no active cardiac issues; her sinus tachycardia was likely compensatory from her comorbidities and she was actively working on smoking cessation in the setting of her prior cancer. Though I suspect her medical risk may be moderate (will ultimately defer to PCP) her actual cardiac risk given lack of sx and the above would be low for a low risk procedure. It may be reasonable to proceed.   I will route this recommendation to the requesting party via Epic fax function and remove from pre-op pool.  Please call with questions.  Damien JAYSON Braver, NP 05/12/2024, 4:40 PM

## 2024-06-19 NOTE — Progress Notes (Signed)
 [facility] [address], [address] [place], [place] [address] [phone]  Date:  06/24/2024   Name:  Susan Cortez   DOB:  [birth year]   MRN:  [id number]  PCP:  Watt Harlene BROCKS, MD    Chief Complaint: Annual Exam   History of Present Illness:  Susan Cortez is a 63 y.o. very pleasant female patient who presents with the following:  Pt seen today for CPE Last seen by me in July  History of cervical spinal stenosis, lower back pain, intermittent claudication, dyslipidemia, prediabetes. 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 She has questionable metastases to the skull  Seen by cardiology in September- no major changes except they changed her lipid med to crestor  from lovastatin  Seen by oncology in August  History of cervical spinal stenosis, lower back pain, intermittent claudication, dyslipidemia, prediabetes. 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 She has questionable metastases to the skull  -Flu shot -declines -covid booster-recommended Mammo is UTD Dexa 7/25 Colon 3/25 She declines a pap smear   Lab Results  Component Value Date   HGBA1C 5.9 03/16/2024     Discussed the use of AI scribe software for clinical note transcription with the patient, who gave verbal consent to proceed.  History of Present Illness Susan Cortez is a 63 year old female who presents with persistent cough and allergy symptoms.  She has  been experiencing a persistent and severe cough for the past two weeks, accompanied by ear pain and a ringing sensation in her throat. Her symptoms worsened after discontinuing Chantix  in early October. She has a history of allergies but has not found effective treatment in the past.  She has a history of smoking and currently smokes a pack a day. She attempted to quit using Chantix  but experienced adverse effects, including disorientation, frequent need for rest, and an episode of not recognizing a neighbor. Her daughter in [place]  suggested using candy cigarettes to aid in smoking cessation. She has tried nicotine patches in the past but developed a rash.  She has a history of bronchitis and pneumonia occurring every fall and spring. She has tried multiple inhalers in the past, which have not been effective and have caused a sore throat. She has been taking Mucinex , which initially helped but then stopped being effective.  She has osteoporosis and was noted by her daughter and brother to be hunched over, leading to the initiation of Fosamax . She is currently taking Crestor  for cholesterol management, having switched from lovastatin , and metoprolol  daily, which was refilled by another provider recently.  Her daughter in [place]  monitors her health. She is up to date on mammogram, bone density, colonoscopy, shingles vaccine, tetanus, and pneumonia vaccine. She declined a flu shot due to past adverse reactions. Blood work, including metabolic panel, cholesterol, and A1c, is current. Thyroid  levels have been normal. No current treatment for oncology issues and no history of abnormal Pap smears.   Patient Active Problem List  Diagnosis Date Noted   Mixed hyperlipidemia 04/29/2024   Aortic atherosclerosis 04/29/2024   Osteoporosis 03/29/2024   Symptomatic mammary hypertrophy 03/06/2024   Tobacco use 06/01/2021   Tachycardia 05/29/2021   Cough 03/28/2021   Port-A-Cath in place 01/02/2021    Chemotherapy induced neutropenia 01/02/2021   COPD, mild (HCC) 12/19/2020   Encounter for antineoplastic chemotherapy 12/05/2020   Goals of care, counseling/discussion 11/09/2020   Small cell carcinoma of right lung (HCC) 11/09/2020   Mediastinal adenopathy 10/18/2020   Pulmonary nodule 1 cm or greater in diameter 06/28/2020   Encounter for screening for lung cancer 01/04/2020   Porokeratosis 12/02/2019   Plantar flexed metatarsal bone of left foot 12/02/2019   Plantar flexed metatarsal bone of right foot 12/02/2019   Prediabetes 05/31/2019   Dyslipidemia 03/01/2018   De Quervain's disease (tenosynovitis) 03/28/2017   Chronic bilateral low back pain without sciatica 03/28/2017   Sebaceous hyperplasia of face 06/26/2016   Chronic right shoulder pain 01/02/2016   Adenoma of left adrenal gland 12/26/2015   Intermittent claudication 12/26/2015   Rotator cuff (capsule) sprain 03/26/2014   Spinal stenosis in cervical region 03/26/2014   Chronic pain 03/26/2014    Past Medical History:  Diagnosis Date   Allergy    Arthritis    Basal cell carcinoma 2018   Removed from Right neck per patient    COPD (chronic obstructive pulmonary disease) (HCC) 06/2019   Diverticulosis 2013   Dyslipidemia 03/01/2018   GERD (gastroesophageal reflux disease) 04/2021   HLD (hyperlipidemia)    Hx of adenomatous colonic polyps 2013   OAB (overactive bladder)    Osteoporosis 03/29/2024   Pre-diabetes    Small cell lung cancer (HCC) 11/09/2020   Stroke (HCC) 2022    Past Surgical History:  Procedure Laterality Date   CESAREAN SECTION     X 3   COLONOSCOPY  2013, 2016, 2019   hx colon polyps   DILATION AND CURETTAGE OF UTERUS     FUDUCIAL PLACEMENT Right 10/18/2020   Procedure: PLACEMENT OF FUDUCIAL;  Surgeon: Shelah Lamar RAMAN, MD;  Location: [facility] OR;  Service: Thoracic;  Laterality: Right;   IR IMAGING GUIDED PORT INSERTION  11/29/2020   IR REMOVAL TUN ACCESS W/ PORT W/O FL MOD SED  05/08/2022   SHOULDER  ARTHROSCOPY WITH OPEN ROTATOR CUFF REPAIR Right 05/2009   TONSILLECTOMY  1977   TUBAL LIGATION     VIDEO BRONCHOSCOPY WITH ENDOBRONCHIAL NAVIGATION N/A 10/18/2020   Procedure: VIDEO BRONCHOSCOPY WITH ENDOBRONCHIAL NAVIGATION;  Surgeon: Shelah Lamar RAMAN, MD;  Location: [facility] OR;  Service: Thoracic;  Laterality: N/A;   VIDEO BRONCHOSCOPY WITH ENDOBRONCHIAL ULTRASOUND N/A 10/18/2020   Procedure: VIDEO BRONCHOSCOPY WITH ENDOBRONCHIAL ULTRASOUND;  Surgeon: Shelah Lamar RAMAN, MD;  Location: [facility] OR;  Service: Thoracic;  Laterality: N/A;   WISDOM TOOTH EXTRACTION      Social History   Tobacco Use   Smoking status: Every Day    Current packs/day: 1.00    Average packs/day: 1 pack/day for 48.0 years (48.0 ttl pk-yrs)    Types: Cigarettes   Smokeless tobacco: Never   Tobacco comments:    1 pack smoked per day ARJ 09/19/22  Vaping Use   Vaping status: Former  Substance Use Topics   Alcohol  use: No   Drug use: No    Family History  Problem Relation Age of Onset   Arthritis Mother    Heart disease Mother    Stroke Mother    Diabetes Father    Heart disease  Father    Hyperlipidemia Father    Obesity Father    Diabetes Sister    Obesity Sister    Pancreatitis Sister    Liver disease Sister        abscess   Alcohol  abuse Brother    Early death Brother    Kidney disease Brother    Obesity Brother    Heart disease Brother    Obesity Brother    Heart attack Brother    Cancer Paternal Grandmother    Colon cancer Neg Hx    Colon polyps Neg Hx    Esophageal cancer Neg Hx    Rectal cancer Neg Hx    Stomach cancer Neg Hx     Allergies  Allergen Reactions   Lyrica [Pregabalin] Swelling    Headache,severe eye pain, swelling   Butrans  [Buprenorphine ] Itching    Itching and redness at the area of the patch.   Chantix  [Varenicline ] Other (See Comments)    Strange dreams/hallucinations   Diclofenac  Swelling   Doxycycline  Nausea Only   Meloxicam Swelling   Tolmetin Other (See Comments)     Stomach pain   Nsaids Other (See Comments)    Stomach pain    Medication list has been reviewed and updated.  Current Outpatient Medications on File Prior to Visit  Medication Sig Dispense Refill   alendronate  (FOSAMAX ) 70 MG tablet Take 1 tablet (70 mg total) by mouth every 7 (seven) days. Take with a full glass of water on an empty stomach. 4 tablet 11   azelastine  (OPTIVAR ) 0.05 % ophthalmic solution INSTILL 1 DROP INTO BOTH EYES TWICE A DAY 18 mL 0   CALCIUM  PO Take 650 mg by mouth daily.     Cholecalciferol (VITAMIN D3) 125 MCG (5000 UT) CAPS Take 5,000 Units by mouth daily.     Cyanocobalamin  (B-12) 100 MCG TABS      guaiFENesin  (MUCINEX ) 600 MG 12 hr tablet Take 2 tablets (1,200 mg total) by mouth 2 (two) times daily. 30 tablet 0   linaclotide  (LINZESS ) 145 MCG CAPS capsule Take 1 capsule (145 mcg total) by mouth daily before breakfast. Take 30-60 minutes before breakfast 90 capsule 3   LORazepam  (ATIVAN ) 0.5 MG tablet TAKE 1 TABLET 30 MINUTES PRIOR TO MRI OR RADIATION 5 tablet 0   Multiple Vitamins-Minerals (MULTIVITAMIN ADULT, MINERALS,) TABS      omeprazole  (PRILOSEC) 20 MG capsule Take 1 capsule (20 mg total) by mouth daily. 90 capsule 3   rosuvastatin  (CRESTOR ) 5 MG tablet Take 1 tablet (5 mg total) by mouth daily. 90 tablet 3   fluocinonide (LIDEX) 0.05 % external solution Apply 1 Application topically as needed.     No current facility-administered medications on file prior to visit.    Review of Systems:  As per HPI- otherwise negative Pulse Readings from Last 3 Encounters:  06/24/24 (!) 107  05/07/24 (!) 122  04/29/24 (!) 102      Physical Examination: Vitals:   06/24/24 1105  BP: 116/72  Pulse: (!) 107  Temp: 97.9 F (36.6 C)  SpO2: 97%   Vitals:   06/24/24 1105  Weight: 194 lb 3.2 oz (88.1 kg)  Height: 5' 8 (1.727 m)   Body mass index is 29.53 kg/m. Ideal Body Weight: Weight in (lb) to have BMI = 25: 164.1  GEN: no acute distress.  Looks well,  moderate overweight.  Chronic smoker HEENT: Atraumatic, Normocephalic.  Bilateral TM wnl, oropharynx normal.  PEERL,EOMI.   Ears and Nose: No external  deformity. CV: RRR, No M/G/R. No JVD. No thrill. No extra heart sounds. PULM: CTA B, no wheezes, crackles, rhonchi. No retractions. No resp. distress. No accessory muscle use. ABD: S, NT, ND, +BS. No rebound. No HSM. EXTR: No c/c/e PSYCH: Normally interactive. Conversant.    Assessment and Plan: Physical exam  Mixed hyperlipidemia  Age related osteoporosis, unspecified pathological fracture presence  Prediabetes  Small cell carcinoma of upper lobe of right lung (HCC)  Tachycardia - Plan: metoprolol  succinate (TOPROL -XL) 25 MG 24 hr tablet  Acute bronchitis with COPD (HCC) - Plan: amoxicillin  (AMOXIL ) 875 MG tablet  Assessment & Plan Adult Wellness Visit Routine wellness visit with current screenings. Pap smear due-declines for now. Declined flu shot due to past reactions. - Decline flu shot.  Acute bronchitis with cough Cough for two weeks, possibly bronchitis. Inhalers ineffective, causing sore throat. Consider COPD if persistent. - Prescribe antibiotics for bronchitis. - Monitor for chronic cough indicating possible COPD.  She might benefit from something like Spiriva , I am not sure which inhaler she was tried previously  Chronic allergic rhinitis Chronic allergies worsened after stopping Chantix . Previous allergy shots ineffective. Mucinex  initially helpful. - Discuss potential use of Mucinex .  Malignant neoplasm of upper lobe, right lung (under observation) Under observation with oncology. No active treatment.  Encouraged smoking cessation  Age-related osteoporosis without current pathological fracture Concerns about posture and potential osteoporosis. Previously prescribed Fosamax .  Mixed hyperlipidemia Recent change in cholesterol medication from lovastatin  to Crestor  by cardiology.  Prediabetes A1c is  current.  Tobacco use disorder Resumed smoking after stopping Chantix . Rash with nicotine patches. Smoking one pack per day. - Recommend trying nicotine gum to reduce smoking.   Signed Harlene Schroeder, MD

## 2024-06-24 ENCOUNTER — Encounter: Payer: Self-pay | Admitting: Family Medicine

## 2024-06-24 ENCOUNTER — Ambulatory Visit: Payer: 59 | Admitting: Family Medicine

## 2024-06-24 VITALS — BP 116/72 | HR 97 | Temp 97.9°F | Ht 68.0 in | Wt 194.2 lb

## 2024-06-24 DIAGNOSIS — J440 Chronic obstructive pulmonary disease with acute lower respiratory infection: Secondary | ICD-10-CM | POA: Diagnosis not present

## 2024-06-24 DIAGNOSIS — Z0000 Encounter for general adult medical examination without abnormal findings: Secondary | ICD-10-CM | POA: Diagnosis not present

## 2024-06-24 DIAGNOSIS — C3411 Malignant neoplasm of upper lobe, right bronchus or lung: Secondary | ICD-10-CM

## 2024-06-24 DIAGNOSIS — J209 Acute bronchitis, unspecified: Secondary | ICD-10-CM | POA: Diagnosis not present

## 2024-06-24 DIAGNOSIS — E782 Mixed hyperlipidemia: Secondary | ICD-10-CM | POA: Diagnosis not present

## 2024-06-24 DIAGNOSIS — R000 Tachycardia, unspecified: Secondary | ICD-10-CM | POA: Diagnosis not present

## 2024-06-24 DIAGNOSIS — M810 Age-related osteoporosis without current pathological fracture: Secondary | ICD-10-CM

## 2024-06-24 DIAGNOSIS — R7303 Prediabetes: Secondary | ICD-10-CM | POA: Diagnosis not present

## 2024-06-24 MED ORDER — AMOXICILLIN 875 MG PO TABS: 875.0000 mg | ORAL_TABLET | Freq: Two times a day (BID) | ORAL | 0 refills | Status: DC

## 2024-06-24 MED ORDER — METOPROLOL SUCCINATE ER 25 MG PO TB24: 12.5000 mg | ORAL_TABLET | Freq: Every day | ORAL | 3 refills | Status: AC

## 2024-06-24 NOTE — Patient Instructions (Signed)
 Good to see you today- let's use amoxicillin  for 10 days for your lungs  Labs are up to date- I would suggest checking your thyroid  at your next lab draw Perhaps try nicotine gum to help cut down on smoking  Take care, please see me in about 6 months Recommend a covid booster this fall

## 2024-07-06 ENCOUNTER — Encounter: Payer: Self-pay | Admitting: Family Medicine

## 2024-07-06 MED ORDER — PREDNISONE 20 MG PO TABS
ORAL_TABLET | ORAL | 0 refills | Status: AC
Start: 2024-07-06 — End: ?

## 2024-07-08 ENCOUNTER — Telehealth: Admitting: Student

## 2024-07-27 ENCOUNTER — Other Ambulatory Visit: Payer: Self-pay | Admitting: Radiation Oncology

## 2024-07-27 ENCOUNTER — Encounter: Payer: Self-pay | Admitting: Radiation Oncology

## 2024-07-27 DIAGNOSIS — F40240 Claustrophobia: Secondary | ICD-10-CM

## 2024-07-27 MED ORDER — LORAZEPAM 0.5 MG PO TABS: ORAL_TABLET | ORAL | 0 refills | Status: AC

## 2024-08-04 ENCOUNTER — Ambulatory Visit
Admission: RE | Admit: 2024-08-04 | Discharge: 2024-08-04 | Disposition: A | Source: Ambulatory Visit | Attending: Radiation Oncology | Admitting: Radiation Oncology

## 2024-08-04 DIAGNOSIS — C3411 Malignant neoplasm of upper lobe, right bronchus or lung: Secondary | ICD-10-CM

## 2024-08-04 MED ORDER — GADOPICLENOL 0.5 MMOL/ML IV SOLN
9.0000 mL | Freq: Once | INTRAVENOUS | Status: AC | PRN
  Administered 2024-08-04: 9 mL via INTRAVENOUS

## 2024-08-10 ENCOUNTER — Telehealth: Payer: Self-pay | Admitting: Radiation Oncology

## 2024-08-10 DIAGNOSIS — C3411 Malignant neoplasm of upper lobe, right bronchus or lung: Secondary | ICD-10-CM

## 2024-08-10 DIAGNOSIS — Z8041 Family history of malignant neoplasm of ovary: Secondary | ICD-10-CM

## 2024-08-10 NOTE — Telephone Encounter (Signed)
 With the patient regarding her MRI scan, she requested to cancel her appointment on 12/22 since she was getting results early.  She has found out that her sister who lives in [place]  unfortunately has a diagnosis of metastatic ovarian cancer with pleural effusion.  She just started chemotherapy last week.  I offered to refer her for genetic testing given her sisters history, and we discussed routine surveillance MRI of the brain in 6 months time.  She is in agreement and will contact me sooner if she has questions or concerns.

## 2024-08-17 ENCOUNTER — Telehealth: Payer: Self-pay | Admitting: Genetic Counselor

## 2024-08-17 ENCOUNTER — Inpatient Hospital Stay

## 2024-08-17 ENCOUNTER — Inpatient Hospital Stay: Attending: Internal Medicine | Admitting: Genetic Counselor

## 2024-08-17 ENCOUNTER — Ambulatory Visit: Admitting: Radiation Oncology

## 2024-08-17 ENCOUNTER — Encounter: Payer: Self-pay | Admitting: Genetic Counselor

## 2024-08-17 DIAGNOSIS — Z8041 Family history of malignant neoplasm of ovary: Secondary | ICD-10-CM

## 2024-08-17 DIAGNOSIS — C3491 Malignant neoplasm of unspecified part of right bronchus or lung: Secondary | ICD-10-CM

## 2024-08-17 LAB — GENETIC SCREENING ORDER

## 2024-08-17 NOTE — Progress Notes (Signed)
 REFERRING PROVIDER: Lanell Donald Stagger, PA-C   PRIMARY PROVIDER:  Watt Harlene BROCKS, MD  PRIMARY REASON FOR VISIT:  1. Family history of malignant neoplasm of ovary   2. Small cell carcinoma of right lung, unspecified part of lung (HCC)      HISTORY OF PRESENT ILLNESS:   Susan Cortez, a 63 y.o. female, was seen for a [facility] genetics consultation at the request of Lanell Donald Stagger, PA-C due to a personal and family history of cancer.  Susan Cortez presents to clinic today to discuss the possibility of a hereditary predisposition to cancer, to discuss genetic testing, and to further clarify her future cancer risks, as well as potential cancer risks for family members.   In 2022, at the age of 53, Susan Cortez was diagnosed with small cell carcinoma of the right lung. This has been treated with chemotherapy and radiation. She had prophylactic cranial radiation, brain MRI every 6 months. She also reports a history of two basal cell carcinoma on her neck, removed around 2015.   CANCER HISTORY:  Oncology History  Small cell carcinoma of right lung (HCC)  11/09/2020 Initial Diagnosis   Small cell carcinoma of right lung (HCC)   11/15/2020 - 02/02/2021 Chemotherapy   Patient is on Treatment Plan : LUNG SMALL CELL Cisplatin  D1 + Etoposide  D1-3 q21d     12/05/2020 Cancer Staging   Staging form: Lung, AJCC 8th Edition - Clinical: Stage IIIA (cT1c, cN2, cM0) - Signed by Sherrod Sherrod, MD on 12/05/2020      RELEVANT MEDICAL HISTORY:  Menarche was at age 84.  First live birth at age 67.  Ovaries intact: yes.  Uterus intact: yes.  Menopausal status: postmenopausal. Started at 39 HRT use: 0 years. Colonoscopy: yes; seven polyps over 3 colonoscopies since 2013. Five of these confirmed to be tubular adenoma. Mammogram within the last year: yes. Number of breast biopsies: 0.  Past Medical History:  Diagnosis Date   Allergy    Arthritis    Basal cell carcinoma 2018    Removed from Right neck per patient    COPD (chronic obstructive pulmonary disease) (HCC) 06/2019   Diverticulosis 2013   Dyslipidemia 03/01/2018   GERD (gastroesophageal reflux disease) 04/2021   HLD (hyperlipidemia)    Hx of adenomatous colonic polyps 2013   OAB (overactive bladder)    Osteoporosis 03/29/2024   Pre-diabetes    Small cell lung cancer (HCC) 11/09/2020   Stroke (HCC) 2022    Past Surgical History:  Procedure Laterality Date   CESAREAN SECTION     X 3   COLONOSCOPY  2013, 2016, 2019   hx colon polyps   DILATION AND CURETTAGE OF UTERUS     FUDUCIAL PLACEMENT Right 10/18/2020   Procedure: PLACEMENT OF FUDUCIAL;  Surgeon: Shelah Lamar RAMAN, MD;  Location: [facility] OR;  Service: Thoracic;  Laterality: Right;   IR IMAGING GUIDED PORT INSERTION  11/29/2020   IR REMOVAL TUN ACCESS W/ PORT W/O FL MOD SED  05/08/2022   SHOULDER ARTHROSCOPY WITH OPEN ROTATOR CUFF REPAIR Right 05/2009   TONSILLECTOMY  1977   TUBAL LIGATION     VIDEO BRONCHOSCOPY WITH ENDOBRONCHIAL NAVIGATION N/A 10/18/2020   Procedure: VIDEO BRONCHOSCOPY WITH ENDOBRONCHIAL NAVIGATION;  Surgeon: Shelah Lamar RAMAN, MD;  Location: [facility] OR;  Service: Thoracic;  Laterality: N/A;   VIDEO BRONCHOSCOPY WITH ENDOBRONCHIAL ULTRASOUND N/A 10/18/2020   Procedure: VIDEO BRONCHOSCOPY WITH ENDOBRONCHIAL ULTRASOUND;  Surgeon: Shelah Lamar RAMAN, MD;  Location: [facility] OR;  Service: Thoracic;  Laterality: N/A;   WISDOM TOOTH EXTRACTION      Social History   Socioeconomic History   Marital status: Divorced    Spouse name: Not on file   Number of children: Not on file   Years of education: Not on file   Highest education level: Associate degree: academic program  Occupational History   Not on file  Tobacco Use   Smoking status: Every Day    Current packs/day: 1.00    Average packs/day: 1 pack/day for 48.0 years (48.0 ttl pk-yrs)    Types: Cigarettes   Smokeless tobacco: Never   Tobacco comments:    1 pack smoked per day [facility] 09/19/22  Vaping  Use   Vaping status: Former  Substance and Sexual Activity   Alcohol  use: No   Drug use: No   Sexual activity: Not on file    Comment: Tubal Ligation  Other Topics Concern   Not on file  Social History Narrative   Not on file   Social Drivers of Health   Tobacco Use: High Risk (06/24/2024)   Patient History    Smoking Tobacco Use: Every Day    Smokeless Tobacco Use: Never    Passive Exposure: Not on file  Financial Resource Strain: Low Risk (04/28/2024)   Overall Financial Resource Strain (CARDIA)    Difficulty of Paying Living Expenses: Not hard at all  Food Insecurity: No Food Insecurity (04/28/2024)   Epic    Worried About Radiation Protection Practitioner of Food in the Last Year: Never true    Ran Out of Food in the Last Year: Never true  Transportation Needs: No Transportation Needs (04/28/2024)   Epic    Lack of Transportation (Medical): No    Lack of Transportation (Non-Medical): No  Physical Activity: Inactive (04/28/2024)   Exercise Vital Sign    Days of Exercise per Week: 0 days    Minutes of Exercise per Session: 0 min  Stress: No Stress Concern Present (04/28/2024)   [employer] of Occupational Health - Occupational Stress Questionnaire    Feeling of Stress: Not at all  Social Connections: Socially Isolated (04/28/2024)   Social Connection and Isolation Panel    Frequency of Communication with Friends and Family: More than three times a week    Frequency of Social Gatherings with Friends and Family: More than three times a week    Attends Religious Services: Never    Database Administrator or Organizations: No    Attends Banker Meetings: Never    Marital Status: Divorced  Depression (PHQ2-9): Low Risk (06/24/2024)   Depression (PHQ2-9)    PHQ-2 Score: 2  Alcohol  Screen: Low Risk (04/28/2024)   Alcohol  Screen    Last Alcohol  Screening Score (AUDIT): 0  Housing: Unknown (04/28/2024)   Epic    Unable to Pay for Housing in the Last Year: No    Number of Times Moved in the  Last Year: Not on file    Homeless in the Last Year: No  Utilities: Not At Risk (04/28/2024)   Epic    Threatened with loss of utilities: No  Health Literacy: Adequate Health Literacy (04/28/2024)   B1300 Health Literacy    Frequency of need for help with medical instructions: Never     FAMILY HISTORY:  We obtained a detailed, 4-generation family history.  Significant diagnoses are listed below: Family History  Problem Relation Age of Onset   Arthritis Mother    Heart disease Mother    Stroke Mother  Diabetes Father    Heart disease Father    Hyperlipidemia Father    Obesity Father    Diabetes Sister    Obesity Sister    Pancreatitis Sister    Liver disease Sister        abscess   Ovarian cancer Sister 26       lung mets   Alcohol  abuse Brother    Early death Brother    Kidney disease Brother    Obesity Brother    Heart disease Brother    Obesity Brother    Heart attack Brother    Lung cancer Paternal Grandmother 41       cigarette use   Brain cancer Other 50   Cancer Maternal Cousin 45 - 72   Leukemia Maternal Cousin 22   Multiple myeloma Maternal Cousin    Colon cancer Neg Hx    Colon polyps Neg Hx    Esophageal cancer Neg Hx    Rectal cancer Neg Hx    Stomach cancer Neg Hx     Susan Cortez is unaware of previous family history of genetic testing for hereditary cancer risks. There is no reported Ashkenazi Jewish ancestry. Ancestry reported as German and Irish.     GENETIC COUNSELING ASSESSMENT: Susan Cortez is a 63 y.o. female with a personal and family history of cancer which is somewhat suggestive of a hereditary predisposition to cancer given her family history of a first degree relative diagnosed with ovarian cancer. We, therefore, discussed and recommended the following at today's visit.   DISCUSSION: We discussed that 5 - 10% of cancer is hereditary, with most cases of hereditary ovarian associated with pathogenic variants in BRCA1/2.  There are other genes  that can be associated with hereditary ovarian cancer syndromes.  We discussed that testing is beneficial for several reasons including knowing how to screen for cancer, identify options for preventing cancer and to understand if other family members could be at risk for cancer and allowing them to undergo genetic testing.   We reviewed the characteristics, features and inheritance patterns of hereditary cancer syndromes. We also discussed genetic testing, including the appropriate family members to test, the process of testing, insurance coverage and turn-around-time for results. We discussed the implications of a negative, positive, carrier and/or variant of uncertain significant result. We recommended Susan Cortez pursue genetic testing for a panel that includes genes associated with ovarian cancer.   Susan Cortez  was offered a common hereditary cancer panel (40+ genes) and an expanded pan-cancer panel (70+ genes). Susan Cortez was informed of the benefits and limitations of each panel, including that expanded pan-cancer panels contain genes that do not have clear management guidelines at this point in time.  We also discussed that as the number of genes included on a panel increases, the chances of variants of uncertain significance increases. After considering the benefits and limitations of each gene panel, Susan Cortez elected to have [facility] CancerNext-Expanded +RNAinsight panel. The CancerNext-Expanded gene panel offered by [facility] and includes sequencing, rearrangement, and RNA analysis for the following 77 genes: AIP, ALK, APC, ATM, AXIN2, BAP1, BARD1, BMPR1A, BRCA1, BRCA2, BRIP1, CDC73, CDH1, CDK4, CDKN1B, CDKN2A, CEBPA, CHEK2, CTNNA1, DDX41, DICER1, EGFR, EPCAM, ETV6, FH, FLCN, GATA2, GREM1, HOXB13, KIT, LZTR1, MAX, MBD4, MEN1, MET, MITF, MLH1, MSH2, MSH3, MSH6, MUTYH, NF1, NF2, NTHL1, PALB2, PDGFRA, PHOX2B, PMS2, POLD1, POLE, POT1, PRKAR1A, PTCH1, PTEN, RAD51C, RAD51D, RB1, RET, RPS20,  RUNX1, SDHA, SDHAF2, SDHB, SDHC, SDHD, SMAD4, SMARCA4, SMARCB1, SMARCE1, STK11, SUFU,  TMEM127, TP53, TSC1, TSC2, VHL, WT1.   Based on Susan Cortez's family history of cancer, she meets medical criteria for genetic testing. Despite that she meets criteria, she may still have an out of pocket cost. We discussed that if her out of pocket cost for testing is over $100, the laboratory should contact them to discuss self-pay prices, patient pay assistance programs, if applicable, and other billing options. Susan Cortez paid for [facility] Patient Assistance Program during the visit, and qualified for testing at $0.   We discussed that some people do not want to undergo genetic testing due to fear of genetic discrimination.  A federal law called the Genetic Information Non-Discrimination Act (GINA) of 2008 helps protect individuals against genetic discrimination based on their genetic test results.  It impacts both health insurance and employment.  With health insurance, it protects against increased premiums, being kicked off insurance or being forced to take a test in order to be insured.  For employment it protects against hiring, firing and promoting decisions based on genetic test results.  GINA does not apply to those in the [employer], those who work for companies with less than 15 employees, and new life insurance or long-term disability insurance policies.  Health status due to a cancer diagnosis is not protected under GINA.  PLAN: After considering the risks, benefits, and limitations, Susan Cortez provided informed consent to pursue genetic testing and the blood sample was sent to [facility] for analysis of the CancerNext-Expanded +RNAinsight. Results should be available within approximately 2-3 weeks' time, at which point they will be disclosed by telephone to Susan Cortez, as will any additional recommendations warranted by these results. Susan Cortez will receive a summary of her genetic counseling  visit and a copy of her results once available. This information will also be available in Epic.   Lastly, we encouraged Susan Cortez to remain in contact with cancer genetics annually so that we can continuously update the family history and inform her of any changes in cancer genetics and testing that may be of benefit for this family.   Susan Cortez questions were answered to her satisfaction today. Our contact information was provided should additional questions or concerns arise. Thank you for the referral and allowing us  to share in the care of your patient.   Burnard Ogren, MS, [facility] Licensed, Retail [email or web address] phone: [phone]   60 minutes were spent on the date of the encounter in service to the patient including preparation, face-to-face consultation, documentation and care coordination.  The patient was seen alone.  Drs. Gudena and/or Lanny were available to discuss this case as needed.   _______________________________________________________________________ For Office Staff:  Number of people involved in session: 1 Was an Intern/ student involved with case: no

## 2024-08-17 NOTE — Telephone Encounter (Signed)
 Received voicemail from Ayari, reporting her mother's brother was diagnosed with colon cancer. Will update in record.

## 2024-08-28 ENCOUNTER — Ambulatory Visit: Payer: Self-pay | Admitting: Genetic Counselor

## 2024-08-28 ENCOUNTER — Encounter: Payer: Self-pay | Admitting: Genetic Counselor

## 2024-08-28 ENCOUNTER — Encounter: Payer: Self-pay | Admitting: Radiation Oncology

## 2024-08-28 DIAGNOSIS — Z1379 Encounter for other screening for genetic and chromosomal anomalies: Secondary | ICD-10-CM | POA: Insufficient documentation

## 2024-08-28 NOTE — Progress Notes (Signed)
 HPI:  Susan Cortez was previously seen in the [facility] due to a personal and family history of cancer and concerns regarding a hereditary predisposition to cancer. Please refer to our prior cancer genetics clinic note for more information regarding our discussion, assessment and recommendations, at the time. Ms. Kinkead recent genetic test results were disclosed to her, as were recommendations warranted by these results. These results and recommendations are discussed in more detail below.  Results were disclosed by telephone on 08/28/2024.   CANCER HISTORY:  Oncology History  Small cell carcinoma of right lung (HCC)  11/09/2020 Initial Diagnosis   Small cell carcinoma of right lung (HCC)   11/15/2020 - 02/02/2021 Chemotherapy   Patient is on Treatment Plan : LUNG SMALL CELL Cisplatin  D1 + Etoposide  D1-3 q21d     12/05/2020 Cancer Staging   Staging form: Lung, AJCC 8th Edition - Clinical: Stage IIIA (cT1c, cN2, cM0) - Signed by Sherrod Sherrod, MD on 12/05/2020     FAMILY HISTORY:  We obtained a detailed, 4-generation family history from patient.  Significant diagnoses are listed below:     GENETIC TEST RESULTS: Genetic testing reported out on 08/26/24 through the [facility] CancerNext-Expanded +RNA panel found no pathogenic mutations. The CancerNext-Expanded gene panel offered by [facility] and includes sequencing, rearrangement, and RNA analysis for the following 77 genes: AIP, ALK, APC, ATM, AXIN2, BAP1, BARD1, BMPR1A, BRCA1, BRCA2, BRIP1, CDC73, CDH1, CDK4, CDKN1B, CDKN2A, CEBPA, CHEK2, CTNNA1, DDX41, DICER1, EGFR, EPCAM, ETV6, FH, FLCN, GATA2, GREM1, HOXB13, KIT, LZTR1, MAX, MBD4, MEN1, MET, MITF, MLH1, MSH2, MSH3, MSH6, MUTYH, NF1, NF2, NTHL1, PALB2, PDGFRA, PHOX2B, PMS2, POLD1, POLE, POT1, PRKAR1A, PTCH1, PTEN, RAD51C, RAD51D, RB1, RET, RPS20, RUNX1, SDHA, SDHAF2, SDHB, SDHC, SDHD, SMAD4, SMARCA4, SMARCB1, SMARCE1, STK11, SUFU, TMEM127, TP53, TSC1, TSC2, VHL, WT1.  The test report has been scanned into EPIC and is located under the Molecular Pathology section of the Results Review tab.  A portion of the result report is included below for reference.     We discussed with Ms. Ronning that because current genetic testing is not perfect, it is possible there may be a gene mutation in one of these genes that current testing cannot detect, but that chance is small.  We also discussed, that there could be another gene that has not yet been discovered, or that we have not yet tested, that is responsible for the cancer diagnoses in the family. It is also possible there is a hereditary cause for the cancer in the family that Ms. Bellavance did not inherit and therefore was not identified in her testing.  Therefore, it is important to remain in touch with cancer genetics in the future so that we can continue to offer Ms. Bisesi the most up to date genetic testing.   ADDITIONAL GENETIC TESTING: We discussed with Ms. Awwad that her genetic testing was fairly extensive.  If there are genes identified to increase cancer risk that can be analyzed in the future, we would be happy to discuss and coordinate this testing at that time.    CANCER SCREENING RECOMMENDATIONS: Ms. Guerrier test result is considered negative (normal).  This means that we have not identified a hereditary cause for her personal and family history of cancer at this time. Most cancers happen by chance and this negative test suggests that her personal and family history of cancer may fall into this category.    Possible reasons for Ms. Dicaprio's negative genetic test include:  1. There may  be a gene mutation in one of these genes that current testing methods cannot detect. The likelihood of this is low, but possible.   2. There could be another gene that has not yet been discovered, or that we have not yet tested, that is responsible for the cancer diagnoses in the family.  3.  There may be no hereditary  risk for cancer in the family. The cancers in Ms. Postel and/or her family may be sporadic/familial or due to other genetic and environmental factors. 4. It is also possible there is a hereditary cause for the cancer in the family that Ms. Mayfield did not inherit.  Therefore, it is recommended she continue to follow the cancer management and screening guidelines provided by her oncology and primary healthcare provider. An individual's cancer risk and medical management are not determined by genetic test results alone. Overall cancer risk assessment incorporates additional factors, including personal medical history, family history, and any available genetic information that may result in a personalized plan for cancer prevention and surveillance  Given Ms. Osberg's personal and family histories, we must interpret these negative results with some caution.  Families with features suggestive of hereditary risk for cancer tend to have multiple family members with cancer, diagnoses in multiple generations and diagnoses before the age of 22. Ms. Butikofer family exhibits some of these features. Thus, this result may simply reflect our current inability to detect all mutations within these genes or there may be a different gene that has not yet been discovered or tested.   RECOMMENDATIONS FOR FAMILY MEMBERS:  Individuals in this family might be at some increased risk of developing cancer, over the general population risk, simply due to the family history of cancer.  We recommended women in this family have a yearly mammogram beginning at age 67, or 44 years younger than the earliest onset of cancer, an annual clinical breast exam, and perform monthly breast self-exams. Women in this family should also have a gynecological exam as recommended by their primary provider. All family members should be referred for colonoscopy starting at age 55, or 84 years younger than the earliest onset of cancer.  Other  relatives may benefit from completing their own genetic testing, especially if they have been diagnosed with cancer.   FOLLOW-UP: Lastly, we discussed with Ms. Wolz that cancer genetics is a rapidly advancing field and it is possible that new genetic tests will be appropriate for her and/or her family members in the future. We encouraged her to remain in contact with cancer genetics on an annual basis so we can update her personal and family histories and let her know of advances in cancer genetics that may benefit this family.   Encouraged Ms. Riedlinger to update us  on the results of her sister's germline testing as this could impact the risk assessment for Ms. Drakeford and her children.   Our contact number was provided. Ms. Traeger questions were answered to her satisfaction, and she knows she is welcome to call us  at anytime with additional questions or concerns.   Burnard Ogren, MS, [facility] Licensed, [email or web address] [phone]

## 2024-09-14 ENCOUNTER — Other Ambulatory Visit: Payer: Self-pay | Admitting: Family Medicine

## 2024-09-14 DIAGNOSIS — R32 Unspecified urinary incontinence: Secondary | ICD-10-CM

## 2024-09-15 ENCOUNTER — Encounter: Payer: Self-pay | Admitting: Family Medicine

## 2024-09-15 DIAGNOSIS — R32 Unspecified urinary incontinence: Secondary | ICD-10-CM

## 2024-09-15 MED ORDER — MIRABEGRON ER 50 MG PO TB24: 50.0000 mg | ORAL_TABLET | Freq: Every day | ORAL | 3 refills | Status: AC

## 2024-09-24 ENCOUNTER — Ambulatory Visit: Admitting: Emergency Medicine

## 2024-09-24 ENCOUNTER — Encounter: Payer: Self-pay | Admitting: Emergency Medicine

## 2024-09-24 VITALS — BP 134/76 | HR 121 | Temp 97.1°F | Ht 68.0 in | Wt 197.2 lb

## 2024-09-24 DIAGNOSIS — J449 Chronic obstructive pulmonary disease, unspecified: Secondary | ICD-10-CM

## 2024-09-24 DIAGNOSIS — F17210 Nicotine dependence, cigarettes, uncomplicated: Secondary | ICD-10-CM

## 2024-09-24 DIAGNOSIS — C3491 Malignant neoplasm of unspecified part of right bronchus or lung: Secondary | ICD-10-CM | POA: Diagnosis not present

## 2024-09-24 DIAGNOSIS — R053 Chronic cough: Secondary | ICD-10-CM

## 2024-09-24 DIAGNOSIS — R059 Cough, unspecified: Secondary | ICD-10-CM | POA: Diagnosis not present

## 2024-09-24 DIAGNOSIS — Z720 Tobacco use: Secondary | ICD-10-CM

## 2024-09-24 MED ORDER — ALBUTEROL SULFATE HFA 108 (90 BASE) MCG/ACT IN AERS: 2.0000 | INHALATION_SPRAY | Freq: Four times a day (QID) | RESPIRATORY_TRACT | 0 refills | Status: AC | PRN

## 2024-09-24 MED ORDER — SPIRIVA RESPIMAT 2.5 MCG/ACT IN AERS: 2.0000 | INHALATION_SPRAY | Freq: Every day | RESPIRATORY_TRACT | Status: AC

## 2024-09-24 NOTE — Assessment & Plan Note (Signed)
 Likely mainly due to smoking.  She has minimal PND.  She has not felt any GERD but she has been on omeprazole .  She is interested in doing a trial off of it.  We will stop it temporarily.  If the cough worsens then she will need to be back on it.

## 2024-09-24 NOTE — Assessment & Plan Note (Signed)
 Try to work hard on decreasing your cigarettes.  Even decreasing by few cigarettes a day would be beneficial.

## 2024-09-24 NOTE — Assessment & Plan Note (Signed)
 We reviewed your CT scan of the chest from August today.  Follow-up with Dr. Sherrod and get your repeat imaging as planned.

## 2024-09-24 NOTE — Progress Notes (Signed)
 "  Subjective:    Patient ID: Susan Cortez, female    DOB: 22-Feb-1961, 64 y.o.   MRN: [id number]  Cough   ROV 09/24/2024 --64 year old woman with a history of active tobacco use, associated COPD and small cell lung cancer for which she was treated by Dr. Sherrod with chemoradiation.  She also has history of GERD, mild rhinitis and a cough. She is not on any scheduled BD therapy right now. Does not have SOB, doesn't have an albuterol  inhaler - has never really benefited from inhalers in the past. Smoking 1 pk/day. She has cut down before - does not feel that she is in a position to do so now. Coughs through the day, worst in the am when she gets up - clear mucous. No blood. Her GERD is controlled - is on omeprazole  but wonders about stopping it.    CT scan of the chest 04/07/2024 reviewed by me, showed stable posttreatment changes in the right upper lobe in the superior segment of the right lower lobe without any evidence for recurrent disease.  Stable irregular nodule in the anterior right lower lobe consistent with scarring.  Stable prominent right hilar node 7 mm.  Patulous esophagus with some wall thickening  MRI brain 08/04/2024 reviewed by me showed no acute intracranial abnormality or evidence of intracranial metastatic disease.  Multiple old right cerebellar infarcts.     Review of Systems  Respiratory:  Positive for cough.    As per HPI     Objective:   Physical Exam Vitals:   09/24/24 1030  BP: 134/76  Pulse: (!) 121  Temp: (!) 97.1 F (36.2 C)  TempSrc: Temporal  SpO2: 96%  Weight: 197 lb 3.2 oz (89.4 kg)  Height: 5' 8 (1.727 m)   Gen: Pleasant, well-nourished, in no distress,  normal affect  ENT: No lesions,  mouth clear,  oropharynx clear, no postnasal drip  Neck: No JVD, no stridor  Lungs: No use of accessory muscles, no crackles or wheezing on normal respiration, no wheeze on forced expiration  Cardiovascular: RRR, heart sounds normal, no murmur or gallops, no  peripheral edema  Musculoskeletal: No deformities, no cyanosis or clubbing  Neuro: alert, awake, non focal  Skin: Warm, no lesions or rash      Assessment & Plan:  Small cell carcinoma of right lung [facility]) We reviewed your CT scan of the chest from August today.  Follow-up with Dr. Sherrod and get your repeat imaging as planned.  COPD, mild (HCC) Try starting Spiriva  Respimat 2 puffs once daily.  Take this for about 1 month and keep track of how it helps your breathing and coughing.  If it is beneficial please message us  and we will send a prescription to your pharmacy. We will give your prescription for albuterol  to make sure you have this available to use 2 puffs if needed for shortness of breath, chest tightness, wheezing. Follow Dr. Shelah in 1 year.  Please call  if you have any problems so we can see you sooner.  Tobacco use Try to work hard on decreasing your cigarettes.  Even decreasing by few cigarettes a day would be beneficial.  Cough Likely mainly due to smoking.  She has minimal PND.  She has not felt any GERD but she has been on omeprazole .  She is interested in doing a trial off of it.  We will stop it temporarily.  If the cough worsens then she will need to be back on it.  I personally spent a total of 28 minutes in the care of the patient today including preparing to see the patient, getting/reviewing separately obtained history, performing a medically appropriate exam/evaluation, counseling and educating, placing orders, documenting clinical information in the EHR, independently interpreting results, and communicating results.   Lamar Chris, MD, PhD 09/24/2024, 11:13 AM [facility] [phone] or if no answer [phone]  "

## 2024-09-24 NOTE — Assessment & Plan Note (Signed)
 Try starting Spiriva  Respimat 2 puffs once daily.  Take this for about 1 month and keep track of how it helps your breathing and coughing.  If it is beneficial please message us  and we will send a prescription to your pharmacy. We will give your prescription for albuterol  to make sure you have this available to use 2 puffs if needed for shortness of breath, chest tightness, wheezing. Follow Dr. Shelah in 1 year.  Please call  if you have any problems so we can see you sooner.

## 2024-09-24 NOTE — Patient Instructions (Signed)
 We reviewed your CT scan of the chest from August today.  Follow-up with Dr. Sherrod and get your repeat imaging as planned. Try to work hard on decreasing your cigarettes.  Even decreasing by few cigarettes a day would be beneficial. Try starting Spiriva  Respimat 2 puffs once daily.  Take this for about 1 month and keep track of how it helps your breathing and coughing.  If it is beneficial please message us  and we will send a prescription to your pharmacy. We will give your prescription for albuterol  to make sure you have this available to use 2 puffs if needed for shortness of breath, chest tightness, wheezing. Okay to stop your omeprazole .  Keep track of how your cough does off the medication.  If the cough worsens then he should probably get back on it. Follow Dr. Shelah in 1 year.  Please call  if you have any problems so we can see you sooner.

## 2024-10-06 ENCOUNTER — Inpatient Hospital Stay: Attending: Internal Medicine

## 2024-10-06 ENCOUNTER — Ambulatory Visit (HOSPITAL_COMMUNITY)

## 2024-10-13 ENCOUNTER — Inpatient Hospital Stay: Admitting: Internal Medicine

## 2024-12-23 ENCOUNTER — Ambulatory Visit: Admitting: Family Medicine

## 2025-02-08 ENCOUNTER — Other Ambulatory Visit

## 2025-05-06 ENCOUNTER — Ambulatory Visit
